# Patient Record
Sex: Female | Born: 1960
Health system: Southern US, Community
[De-identification: ages and names within clinical notes are randomized; demographics above are authoritative.]

## PROBLEM LIST (undated history)

## (undated) DIAGNOSIS — I209 Angina pectoris, unspecified: Secondary | ICD-10-CM

## (undated) DIAGNOSIS — M5412 Radiculopathy, cervical region: Secondary | ICD-10-CM

## (undated) DIAGNOSIS — M549 Dorsalgia, unspecified: Secondary | ICD-10-CM

## (undated) DIAGNOSIS — E669 Obesity, unspecified: Secondary | ICD-10-CM

## (undated) DIAGNOSIS — E119 Type 2 diabetes mellitus without complications: Secondary | ICD-10-CM

## (undated) DIAGNOSIS — G4730 Sleep apnea, unspecified: Secondary | ICD-10-CM

## (undated) DIAGNOSIS — F329 Major depressive disorder, single episode, unspecified: Secondary | ICD-10-CM

## (undated) DIAGNOSIS — G8929 Other chronic pain: Secondary | ICD-10-CM

## (undated) DIAGNOSIS — Z86018 Personal history of other benign neoplasm: Secondary | ICD-10-CM

## (undated) DIAGNOSIS — M5030 Other cervical disc degeneration, unspecified cervical region: Secondary | ICD-10-CM

## (undated) DIAGNOSIS — R0600 Dyspnea, unspecified: Secondary | ICD-10-CM

## (undated) DIAGNOSIS — K862 Cyst of pancreas: Secondary | ICD-10-CM

## (undated) DIAGNOSIS — J329 Chronic sinusitis, unspecified: Secondary | ICD-10-CM

## (undated) DIAGNOSIS — F32A Depression, unspecified: Secondary | ICD-10-CM

## (undated) DIAGNOSIS — I10 Essential (primary) hypertension: Secondary | ICD-10-CM

## (undated) HISTORY — PX: NASAL SINUS SURGERY: SHX719

## (undated) HISTORY — DX: Depression, unspecified: F32.A

## (undated) HISTORY — PX: ABDOMINAL HYSTERECTOMY: SHX81

## (undated) HISTORY — DX: Major depressive disorder, single episode, unspecified: F32.9

## (undated) HISTORY — DX: Radiculopathy, cervical region: M54.12

## (undated) HISTORY — DX: Obesity, unspecified: E66.9

## (undated) HISTORY — DX: Sleep apnea, unspecified: G47.30

---
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
## 2000-04-03 ENCOUNTER — Inpatient Hospital Stay (HOSPITAL_COMMUNITY): Admission: AD | Admit: 2000-04-03 | Discharge: 2000-04-03 | Payer: Self-pay | Admitting: Obstetrics

## 2000-04-04 ENCOUNTER — Encounter: Payer: Self-pay | Admitting: Obstetrics

## 2000-04-04 ENCOUNTER — Ambulatory Visit (HOSPITAL_COMMUNITY): Admission: RE | Admit: 2000-04-04 | Discharge: 2000-04-04 | Payer: Self-pay | Admitting: Obstetrics

## 2000-04-06 ENCOUNTER — Inpatient Hospital Stay (HOSPITAL_COMMUNITY): Admission: AD | Admit: 2000-04-06 | Discharge: 2000-04-06 | Payer: Self-pay | Admitting: Obstetrics

## 2000-04-11 ENCOUNTER — Encounter (INDEPENDENT_AMBULATORY_CARE_PROVIDER_SITE_OTHER): Payer: Self-pay

## 2000-04-11 ENCOUNTER — Ambulatory Visit (HOSPITAL_COMMUNITY): Admission: RE | Admit: 2000-04-11 | Discharge: 2000-04-11 | Payer: Self-pay | Admitting: Obstetrics

## 2004-04-05 ENCOUNTER — Emergency Department (HOSPITAL_COMMUNITY): Admission: EM | Admit: 2004-04-05 | Discharge: 2004-04-05 | Payer: Self-pay | Admitting: Emergency Medicine

## 2004-07-05 ENCOUNTER — Emergency Department (HOSPITAL_COMMUNITY): Admission: EM | Admit: 2004-07-05 | Discharge: 2004-07-05 | Payer: Self-pay | Admitting: Emergency Medicine

## 2004-07-10 ENCOUNTER — Ambulatory Visit: Payer: Self-pay | Admitting: Family Medicine

## 2005-02-05 ENCOUNTER — Emergency Department (HOSPITAL_COMMUNITY): Admission: EM | Admit: 2005-02-05 | Discharge: 2005-02-05 | Payer: Self-pay | Admitting: Family Medicine

## 2006-03-18 ENCOUNTER — Emergency Department (HOSPITAL_COMMUNITY): Admission: EM | Admit: 2006-03-18 | Discharge: 2006-03-18 | Payer: Self-pay | Admitting: Emergency Medicine

## 2006-05-20 ENCOUNTER — Ambulatory Visit (HOSPITAL_COMMUNITY): Admission: RE | Admit: 2006-05-20 | Discharge: 2006-05-20 | Payer: Self-pay | Admitting: Chiropractic Medicine

## 2006-06-03 ENCOUNTER — Encounter: Admission: RE | Admit: 2006-06-03 | Discharge: 2006-06-26 | Payer: Self-pay | Admitting: Family Medicine

## 2006-06-26 ENCOUNTER — Encounter: Admission: RE | Admit: 2006-06-26 | Discharge: 2006-06-26 | Payer: Self-pay | Admitting: Internal Medicine

## 2006-07-02 ENCOUNTER — Encounter (INDEPENDENT_AMBULATORY_CARE_PROVIDER_SITE_OTHER): Payer: Self-pay | Admitting: Specialist

## 2006-07-02 ENCOUNTER — Ambulatory Visit (HOSPITAL_COMMUNITY): Admission: RE | Admit: 2006-07-02 | Discharge: 2006-07-02 | Payer: Self-pay | Admitting: Obstetrics & Gynecology

## 2006-07-02 ENCOUNTER — Encounter: Admission: RE | Admit: 2006-07-02 | Discharge: 2006-07-02 | Payer: Self-pay | Admitting: Internal Medicine

## 2006-07-02 HISTORY — PX: BREAST BIOPSY: SHX20

## 2006-10-08 ENCOUNTER — Emergency Department (HOSPITAL_COMMUNITY): Admission: EM | Admit: 2006-10-08 | Discharge: 2006-10-08 | Payer: Self-pay | Admitting: Emergency Medicine

## 2006-10-23 ENCOUNTER — Inpatient Hospital Stay (HOSPITAL_COMMUNITY): Admission: RE | Admit: 2006-10-23 | Discharge: 2006-10-26 | Payer: Self-pay | Admitting: Obstetrics & Gynecology

## 2006-10-23 ENCOUNTER — Encounter (INDEPENDENT_AMBULATORY_CARE_PROVIDER_SITE_OTHER): Payer: Self-pay | Admitting: Specialist

## 2007-05-25 ENCOUNTER — Emergency Department (HOSPITAL_COMMUNITY): Admission: EM | Admit: 2007-05-25 | Discharge: 2007-05-25 | Payer: Self-pay | Admitting: Emergency Medicine

## 2007-08-23 ENCOUNTER — Ambulatory Visit (HOSPITAL_BASED_OUTPATIENT_CLINIC_OR_DEPARTMENT_OTHER): Admission: RE | Admit: 2007-08-23 | Discharge: 2007-08-23 | Payer: Self-pay | Admitting: Allergy and Immunology

## 2007-08-30 ENCOUNTER — Ambulatory Visit: Payer: Self-pay | Admitting: Internal Medicine

## 2007-09-15 ENCOUNTER — Ambulatory Visit (HOSPITAL_BASED_OUTPATIENT_CLINIC_OR_DEPARTMENT_OTHER): Admission: RE | Admit: 2007-09-15 | Discharge: 2007-09-15 | Payer: Self-pay | Admitting: Allergy and Immunology

## 2007-10-17 ENCOUNTER — Ambulatory Visit: Payer: Self-pay | Admitting: Internal Medicine

## 2007-11-12 ENCOUNTER — Emergency Department (HOSPITAL_COMMUNITY): Admission: EM | Admit: 2007-11-12 | Discharge: 2007-11-12 | Payer: Self-pay | Admitting: Family Medicine

## 2008-05-13 ENCOUNTER — Encounter (INDEPENDENT_AMBULATORY_CARE_PROVIDER_SITE_OTHER): Payer: Self-pay | Admitting: Nurse Practitioner

## 2008-06-21 ENCOUNTER — Ambulatory Visit: Payer: Self-pay | Admitting: Family Medicine

## 2008-06-21 DIAGNOSIS — I10 Essential (primary) hypertension: Secondary | ICD-10-CM

## 2008-06-21 LAB — CONVERTED CEMR LAB
Albumin: 4.3 g/dL (ref 3.5–5.2)
Alkaline Phosphatase: 74 units/L (ref 39–117)
BUN: 10 mg/dL (ref 6–23)
Eosinophils Absolute: 0.3 10*3/uL (ref 0.0–0.7)
Eosinophils Relative: 5 % (ref 0–5)
Glucose, Bld: 79 mg/dL (ref 70–99)
HCT: 40.5 % (ref 36.0–46.0)
Lymphs Abs: 2.5 10*3/uL (ref 0.7–4.0)
MCV: 81.5 fL (ref 78.0–100.0)
Monocytes Relative: 7 % (ref 3–12)
Potassium: 4.5 meq/L (ref 3.5–5.3)
RBC: 4.97 M/uL (ref 3.87–5.11)
TSH: 0.764 microintl units/mL (ref 0.350–4.50)
WBC: 5.5 10*3/uL (ref 4.0–10.5)

## 2008-07-04 ENCOUNTER — Encounter (INDEPENDENT_AMBULATORY_CARE_PROVIDER_SITE_OTHER): Payer: Self-pay | Admitting: Family Medicine

## 2008-09-13 ENCOUNTER — Ambulatory Visit: Payer: Self-pay | Admitting: Family Medicine

## 2008-09-15 ENCOUNTER — Ambulatory Visit: Payer: Self-pay | Admitting: *Deleted

## 2008-09-20 ENCOUNTER — Encounter (INDEPENDENT_AMBULATORY_CARE_PROVIDER_SITE_OTHER): Payer: Self-pay | Admitting: Nurse Practitioner

## 2008-09-20 ENCOUNTER — Encounter (INDEPENDENT_AMBULATORY_CARE_PROVIDER_SITE_OTHER): Payer: Self-pay | Admitting: Family Medicine

## 2008-09-23 ENCOUNTER — Ambulatory Visit: Payer: Self-pay | Admitting: Family Medicine

## 2008-09-29 ENCOUNTER — Ambulatory Visit (HOSPITAL_COMMUNITY): Admission: RE | Admit: 2008-09-29 | Discharge: 2008-09-29 | Payer: Self-pay | Admitting: Family Medicine

## 2008-09-30 ENCOUNTER — Ambulatory Visit: Payer: Self-pay | Admitting: Family Medicine

## 2008-10-03 ENCOUNTER — Telehealth (INDEPENDENT_AMBULATORY_CARE_PROVIDER_SITE_OTHER): Payer: Self-pay | Admitting: Family Medicine

## 2008-10-04 ENCOUNTER — Ambulatory Visit: Payer: Self-pay | Admitting: Family Medicine

## 2008-10-10 ENCOUNTER — Encounter (INDEPENDENT_AMBULATORY_CARE_PROVIDER_SITE_OTHER): Payer: Self-pay | Admitting: Family Medicine

## 2008-10-14 ENCOUNTER — Emergency Department (HOSPITAL_COMMUNITY): Admission: EM | Admit: 2008-10-14 | Discharge: 2008-10-14 | Payer: Self-pay | Admitting: Emergency Medicine

## 2008-10-18 ENCOUNTER — Encounter: Admission: RE | Admit: 2008-10-18 | Discharge: 2008-10-18 | Payer: Self-pay | Admitting: Family Medicine

## 2008-10-21 ENCOUNTER — Ambulatory Visit: Payer: Self-pay | Admitting: Family Medicine

## 2008-10-28 ENCOUNTER — Ambulatory Visit: Payer: Self-pay | Admitting: Family Medicine

## 2008-10-31 ENCOUNTER — Ambulatory Visit: Payer: Self-pay | Admitting: Family Medicine

## 2008-11-04 ENCOUNTER — Ambulatory Visit: Payer: Self-pay | Admitting: Family Medicine

## 2008-11-28 ENCOUNTER — Ambulatory Visit: Payer: Self-pay | Admitting: Family Medicine

## 2008-12-02 ENCOUNTER — Ambulatory Visit: Payer: Self-pay | Admitting: Family Medicine

## 2009-01-30 ENCOUNTER — Ambulatory Visit: Payer: Self-pay | Admitting: Family Medicine

## 2009-02-02 ENCOUNTER — Emergency Department (HOSPITAL_COMMUNITY): Admission: EM | Admit: 2009-02-02 | Discharge: 2009-02-02 | Payer: Self-pay | Admitting: Emergency Medicine

## 2009-02-03 ENCOUNTER — Emergency Department (HOSPITAL_COMMUNITY): Admission: EM | Admit: 2009-02-03 | Discharge: 2009-02-03 | Payer: Self-pay | Admitting: Emergency Medicine

## 2009-02-26 ENCOUNTER — Encounter (INDEPENDENT_AMBULATORY_CARE_PROVIDER_SITE_OTHER): Payer: Self-pay | Admitting: Family Medicine

## 2009-02-26 LAB — CONVERTED CEMR LAB
ALT: 18 units/L (ref 0–35)
AST: 17 units/L (ref 0–37)
Albumin: 3.9 g/dL (ref 3.5–5.2)
Alkaline Phosphatase: 88 units/L (ref 39–117)
Glucose, Bld: 84 mg/dL (ref 70–99)
LDL Cholesterol: 148 mg/dL — ABNORMAL HIGH (ref 0–99)
Potassium: 4 meq/L (ref 3.5–5.3)
Sodium: 141 meq/L (ref 135–145)
Total Protein: 7.8 g/dL (ref 6.0–8.3)
Triglycerides: 102 mg/dL (ref <150)

## 2009-04-05 ENCOUNTER — Emergency Department (HOSPITAL_COMMUNITY): Admission: EM | Admit: 2009-04-05 | Discharge: 2009-04-05 | Payer: Self-pay | Admitting: Emergency Medicine

## 2010-05-11 ENCOUNTER — Encounter (INDEPENDENT_AMBULATORY_CARE_PROVIDER_SITE_OTHER): Payer: Self-pay | Admitting: Nurse Practitioner

## 2010-11-04 ENCOUNTER — Encounter: Payer: Self-pay | Admitting: Family Medicine

## 2010-11-13 NOTE — Letter (Signed)
Summary: REQUESTING RECORDS FOR SELF//RECEIVED  REQUESTING RECORDS FOR SELF//RECEIVED   Imported By: Arta Bruce 06/12/2010 12:13:57  _____________________________________________________________________  External Attachment:    Type:   Image     Comment:   External Document

## 2011-01-23 LAB — WOUND CULTURE

## 2011-02-26 NOTE — Procedures (Signed)
NAME:  NAVY, Michele Wood    ACCOUNT NO.:  [id number]   MEDICAL RECORD NO.:  [id number]          PATIENT TYPE:  OUT   LOCATION:  SLEEP CENTER                 FACILITY:  [facility]   PHYSICIAN:  Clinton D. Maple Hudson, MD, FCCP, FACPDATE OF BIRTH:  1962/05/26   DATE OF STUDY:  09/15/2007                            NOCTURNAL POLYSOMNOGRAM   REFERRING PHYSICIAN:  Jessica Priest, M.D.   INDICATION FOR STUDY:  Hypersomnia with sleep apnea.  BMI 34, weight 200  pounds, height 64 inches, neck 15 inches.   EPWORTH SLEEPINESS SCORE:  8/24   MEDICATIONS:  Home medications listed and reviewed.  No bedtime  medication was reported.   A baseline diagnostic study on August 23, 2007, recorded an AHI of 13.4  per hour.  CPAP titration is requested.   SLEEP ARCHITECTURE:  Total sleep time 393 minutes with sleep efficiency  90%.  Stage I was 12%, stage II 65%, stage III 11%, REM 11% of total  sleep time.  Sleep latency 13 minutes, REM latency 126 minutes.  Awake  after sleep onset 29 minutes.  Arousal index 3.7.   RESPIRATORY DATA:  CPAP titration protocol.  CPAP was titrated to 11  CWP, AHI 0.5 per hour.  A medium Mirage Quattro mask was used with  heated humidifier.   OXYGEN DATA:  Snoring was prevented and oxygen saturation held at a mean  of 97% on CPAP with room air.   CARDIAC DATA:  Normal sinus rhythm.   MOVEMENT-PARASOMNIA:  Occasional limb jerk with little effect on sleep.  No bathroom trips.   IMPRESSIONS-RECOMMENDATIONS:  1. Successful CPAP titration to 11 CWP, AHI 0.5 per hour.  A medium      Mirage Quattro full face mask was used with heated humidifier.  2. Baseline diagnostic study on August 23, 2007, had recorded an AHI      of 13.4 per hour.     Clinton D. Maple Hudson, MD, [facility], FACP  Diplomate, Biomedical engineer of Sleep Medicine  Electronically Signed    CDY/MEDQ  D:  09/20/2007 15:26:48  T:  09/21/2007 09:81:19  Job:  [id number]

## 2011-02-26 NOTE — Procedures (Signed)
NAME:  Michele Wood, Michele Wood    ACCOUNT NO.:  [id number]   MEDICAL RECORD NO.:  [id number]          PATIENT TYPE:  OUT   LOCATION:  SLEEP CENTER                 FACILITY:  [facility]   PHYSICIAN:  Clinton D. Maple Hudson, MD, FCCP, FACPDATE OF BIRTH:  [birth year] 10[birth year] 1961   DATE OF STUDY:  08/23/2007                            NOCTURNAL POLYSOMNOGRAM   REFERRING PHYSICIAN:  Jessica Priest, M.D.   INDICATIONS FOR STUDY:  Insomnia with sleep apnea.   EPWORTH SLEEPINESS SCORE:  7/24.  BMI 45.3, weight 249 pounds, height 62 inches, neck 15 inches.   MEDICATIONS:  Home medications are listed and reviewed. Noted to include  Clarinex D, potential stimulant.   SLEEP ARCHITECTURE:  Total sleep time 305 times with sleep efficiency  76%. Stage 1 was 7%, stage 2 is 75%, stage 3 absent. REM 17% of total  sleep time. Sleep latency 17 minutes, REM latency 94 minutes, awake  after sleep onset 78 minutes. Arousal index 13.7. No bedtime medication  was taken.   RESPIRATORY DATA:  Apnea-hypopnea index (AHI) 13.4 obstructive events  per hour indicating mild obstructive sleep apnea/hypopnea syndrome.  There were 2 obstructive apneas and 66 hypopneas. Events were  nonpositional. REM AHI 28.6. There were insufficient events to prevent  CPAP titration by split protocol on this study night.   OXYGEN DATA:  Very loud snoring with oxygen desaturation to a nadir of  88%. Mean oxygen saturation through the study 95.9%.   CARDIAC DATA:  Sinus rhythm with occasional PVC.   MOVEMENT-PARASOMNIA:  Occasional limb jerk with arousal, not  significant.   IMPRESSIONS-RECOMMENDATIONS:  1. Mild obstructive sleep apnea/hypopnea syndrome, AHI 13.4 per hour,      nonpositional events, loud snoring and oxygen desaturation to a      nadir of 88%.  2. There were insufficient events on this study to permit CPAP      titration by split protocol in the available time. Consider return      for CPAP titration or evaluate for alternative  therapies as      appropriate.      Clinton D. Maple Hudson, MD, [facility], FACP  Diplomate, Biomedical engineer of Sleep Medicine  Electronically Signed     CDY/MEDQ  D:  08/30/2007 12:42:17  T:  08/31/2007 08:59:17  Job:  [id number]

## 2011-03-01 NOTE — Op Note (Signed)
NAME:  Michele Wood, Michele Wood    ACCOUNT NO.:  [id number]   MEDICAL RECORD NO.:  [id number]           PATIENT TYPE:  INP   LOCATION:                                FACILITY:  [facility]   PHYSICIAN:  Roseanna Rainbow, M.D.DATE OF BIRTH:  1961/07/06   DATE OF PROCEDURE:  DATE OF DISCHARGE:  10/26/2006                               OPERATIVE REPORT   PREOPERATIVE DIAGNOSIS:  Symptomatic uterine fibroids.   POSTOPERATIVE DIAGNOSIS:  Symptomatic uterine fibroids.   PROCEDURE:  Total abdominal hysterectomy.   SURGEON:  Roseanna Rainbow, M.D.  Charles A. Clearance Coots, M.D.   ANESTHESIA:  General endotracheal anesthesia.   ESTIMATED BLOOD LOSS:  100 mL.   COMPLICATIONS:  None   FINDINGS:  The uterus was diffusely enlarged.  There were multiple  subserosal myomas.  The remainder of the pelvic anatomy was normal  appearing.   DESCRIPTION OF PROCEDURE:  The patient was taken to the operating room  with an IV running.  She was given general anesthesia, placed in the  dorsal lithotomy position, and prepped and draped in the usual sterile  fashion.  The previous Pfannenstiel scar was then incised with a scalpel  and carried down to the underlying fascia.  The fascia was nicked in the  midline.  The fascial incision was then extended bilaterally with curved  Mayo scissors.  The superior aspect of the fascial incision was then  tented up and the underlying rectus muscles dissected off.  The inferior  aspect of the fascial incision was manipulated in a similar fashion.  The rectus muscles were separated in the midline.  The parietal  peritoneum was tented up and entered sharply.  The peritoneal incision  was then extended superiorly and inferiorly.   At this point, the bowel was packed away with moistened laparotomy  packs.  A large fundal subserosal myoma was grasped with a towel clip  for retraction.  The Deavers were placed into the incision for exposure.  The round ligaments were  divided bilaterally using the Bovie.  The broad  ligament was divided with the Bovie.  The bladder flap was then  developed using the Bovie.  The utero-ovarian ligaments and fallopian  tubes were then doubly clamped with parametrial clamps and transected.  These pedicles were secured bilaterally with both free ligatures and  suture ligatures of 0 plain.  Adequate hemostasis was noted.  The  uterine arteries were then skeletonized bilaterally.  The arteries were  then clamped with parametrial clamps, transected and suture-ligated with  0 Vicryl.   At this point, the uterine fundus was amputated above the uterine vessel  ligatures.  An O'Connor-O'Sullivan retractor was placed into the  incision.  The bowel was repacked.  The cardinal ligaments and  uterosacral ligaments were then sequentially clamped with parametrial  clamps, transected and suture ligated.  The vaginal angles were secured  with suture ligatures of 0 Vicryl.  The remainder of the vaginal cuff  was closed using interrupted sutures of 0 Vicryl was used.  This was  after the cervix had been amputated with scissors.   The pelvis was copiously irrigated.  Adequate hemostasis was noted.  The  laparotomy packs and retractors were then removed from the abdomen.  The  parietal peritoneum was reapproximated in a running fashion using 2-0  Monocryl.  The fascia was closed in a running fashion using 0 PDS.  The  skin was reapproximated in a subcuticular fashion using 3-0 Monocryl.  At the close of the procedure, the instrument and pack counts were said  to be correct x2.  A gram of cephazolin had been given prior to the  procedure.  The patient was taken to the PACU awake and in stable  condition.      Roseanna Rainbow, M.D.  Electronically Signed     LAJ/MEDQ  D:  10/23/2006  T:  10/23/2006  Job:  [id number]

## 2011-03-01 NOTE — Discharge Summary (Signed)
NAME:  Michele Wood, Michele Wood    ACCOUNT NO.:  [id number]   MEDICAL RECORD NO.:  [id number]          PATIENT TYPE:  INP   LOCATION:  9309                          FACILITY:  [facility]   PHYSICIAN:  Charles A. Clearance Coots, M.D.DATE OF BIRTH:  23-Apr-1961   DATE OF ADMISSION:  10/23/2006  DATE OF DISCHARGE:  10/26/2006                               DISCHARGE SUMMARY   ADMITTING DIAGNOSIS:  Symptomatic uterine fibroids.   DISCHARGE DIAGNOSIS:  1. Symptomatic uterine fibroids.  2. Status post total abdominal hysterectomy.   DISCHARGE CONDITION:  Home in good condition.   REASON FOR ADMISSION:  A 50 year old black female with painful uterine  fibroids presents for a total abdominal hysterectomy.  She gives a  history of menometrorrhagia workup to date with hemoglobin of 11 on  07/21/2006 ultrasound on 07/02/2006 revealed the uterus approximately 14  cm in length with multiple uterine fibroids.  The patient desired  definitive surgical therapy.   PAST MEDICAL HISTORY:  Surgery:  Cesarean section.  Illnesses:  Hypertension.   MEDICATIONS:  See chart.   ALLERGIES:  CODEINE gives a rash.   SOCIAL HISTORY:  Married, minimal alcohol, negative tobacco,  recreational drug use.   PHYSICAL EXAM:  GENERAL:  An Obese female in no acute distress.  VITAL SIGNS:  Temperature 98.2, pulse 71, blood pressure 160/110, weight  240 pounds, height 5 feet 2 inches.  LUNGS:  Clear to auscultation bilaterally.  HEART:  Regular rate and rhythm.  ABDOMEN:  Mass arising from the pelvis approximately four fingerbreadths  above the symphysis pubis.  PELVIC EXAM:  Remarkable for a 16 weeks' size uterus.   ADMITTING LABORATORY VALUES:  Hemoglobin 11, hematocrit 33, white blood  cell count 6800, platelets 271,000.  Comprehensive metabolic panel was  within normal limits.   HOSPITAL COURSE:  The patient underwent total abdominal hysterectomy on  10/23/2006.  There are no intraoperative complications.   Postoperative  course was uncomplicated.  The patient was discharged home on postop day  #2 in good condition.   DISCHARGE LABORATORY VALUES:  Hemoglobin 10, hematocrit 30, white blood  cell count 11,900, platelets 273,000.   DISCHARGE DISPOSITION:  1. Tylox and ibuprofen was prescribed for pain.  2. Routine written instructions were given for discharge after      hysterectomy.  3. The patient is call office for a followup appointment in 2 weeks.      Charles A. Clearance Coots, M.D.  Electronically Signed     CAH/MEDQ  D:  10/26/2006  T:  10/26/2006  Job:  [id number]

## 2011-03-01 NOTE — Op Note (Signed)
[facility]  Patient:    Michele Wood, Michele Wood                       MRN: [id number] Adm. Date:  [id number] Attending:  Venita Sheffield                           Operative Report  PREOPERATIVE DIAGNOSIS:       Blighted ovum.  POSTOPERATIVE DIAGNOSIS:      Blighted ovum.  PROCEDURE:                    Dilation and evacuation.  SURGEON:                      Kathreen Cosier, M.D.  DESCRIPTION OF PROCEDURE:     Using MAC with the patient in the lithotomy position, the perineum and vagina were prepped and draped and the bladder was emptied with a straight catheter.  Bimanual exam revealed the uterus to be 16 week size and irregular with multiple myomas.  A weighted speculum was placed in the vagina.  The cervix was injected at 3 and 9 oclock with a total of 7 cc of 1% Xylocaine.  The cervix was noted to be open.  It was dilated to a #29 [employer].  Using a #9 suction, the endometrial cavity was entered and, using the suction, the uterine contents were aspirated, revealing a small amount of tissue.  The patient tolerated the procedure well and went to the recovery room in good condition. DD:  04/11/00 TD:  04/12/00 Job: [id number]

## 2011-05-12 ENCOUNTER — Emergency Department (HOSPITAL_COMMUNITY)
Admission: EM | Admit: 2011-05-12 | Discharge: 2011-05-13 | Disposition: A | Discharge status: Home / Self Care | Payer: Self-pay | Attending: Emergency Medicine | Admitting: Emergency Medicine

## 2011-05-12 DIAGNOSIS — R51 Headache: Secondary | ICD-10-CM | POA: Insufficient documentation

## 2011-05-12 DIAGNOSIS — L988 Other specified disorders of the skin and subcutaneous tissue: Secondary | ICD-10-CM | POA: Insufficient documentation

## 2011-05-12 DIAGNOSIS — J329 Chronic sinusitis, unspecified: Secondary | ICD-10-CM | POA: Insufficient documentation

## 2011-05-12 DIAGNOSIS — I10 Essential (primary) hypertension: Secondary | ICD-10-CM | POA: Insufficient documentation

## 2011-05-13 ENCOUNTER — Encounter (HOSPITAL_COMMUNITY): Payer: Self-pay

## 2011-05-13 ENCOUNTER — Emergency Department (HOSPITAL_COMMUNITY): Payer: Self-pay

## 2012-04-07 ENCOUNTER — Emergency Department (HOSPITAL_COMMUNITY)
Admission: EM | Admit: 2012-04-07 | Discharge: 2012-04-07 | Disposition: A | Discharge status: Home / Self Care | Payer: Self-pay | Attending: Emergency Medicine | Admitting: Emergency Medicine

## 2012-04-07 ENCOUNTER — Encounter (HOSPITAL_COMMUNITY): Payer: Self-pay | Admitting: Emergency Medicine

## 2012-04-07 DIAGNOSIS — Z885 Allergy status to narcotic agent status: Secondary | ICD-10-CM | POA: Insufficient documentation

## 2012-04-07 DIAGNOSIS — L089 Local infection of the skin and subcutaneous tissue, unspecified: Secondary | ICD-10-CM | POA: Insufficient documentation

## 2012-04-07 DIAGNOSIS — I10 Essential (primary) hypertension: Secondary | ICD-10-CM

## 2012-04-07 DIAGNOSIS — Z90710 Acquired absence of both cervix and uterus: Secondary | ICD-10-CM | POA: Insufficient documentation

## 2012-04-07 DIAGNOSIS — L02219 Cutaneous abscess of trunk, unspecified: Secondary | ICD-10-CM

## 2012-04-07 HISTORY — DX: Essential (primary) hypertension: I10

## 2012-04-07 MED ORDER — HYDROCHLOROTHIAZIDE 25 MG PO TABS: 25.0000 mg | ORAL_TABLET | Freq: Every day | ORAL | Status: DC

## 2012-04-07 MED ORDER — SULFAMETHOXAZOLE-TRIMETHOPRIM 800-160 MG PO TABS: 2.0000 | ORAL_TABLET | Freq: Two times a day (BID) | ORAL | Status: AC

## 2012-04-07 MED ORDER — LISINOPRIL 20 MG PO TABS: 20.0000 mg | ORAL_TABLET | Freq: Every day | ORAL | Status: DC

## 2012-04-07 MED ORDER — HYDROCODONE-ACETAMINOPHEN 5-500 MG PO TABS: 1.0000 | ORAL_TABLET | Freq: Four times a day (QID) | ORAL | Status: AC | PRN

## 2012-04-07 MED ORDER — LISINOPRIL 20 MG PO TABS
20.0000 mg | ORAL_TABLET | Freq: Once | ORAL | Status: AC
  Administered 2012-04-07: 20 mg via ORAL
  Filled 2012-04-07 (×2): qty 1

## 2012-04-07 MED ORDER — HYDROCHLOROTHIAZIDE 25 MG PO TABS
25.0000 mg | ORAL_TABLET | Freq: Every day | ORAL | Status: DC
  Administered 2012-04-07: 25 mg via ORAL
  Filled 2012-04-07 (×2): qty 1

## 2012-04-07 NOTE — ED Notes (Signed)
ZOX:[id number]<AV> Expected date:04/07/12<BR> Expected time: 9:02 AM<BR> Means of arrival:Ambulance<BR> Comments:<BR> M31. 29yoM.  L hand/wrist pain x 10 days

## 2012-04-07 NOTE — ED Notes (Addendum)
Pt c/o of boil in genitalia area that started x2 days ago. Pt states that it has clear fluid leaking from it. Painful to touch.

## 2012-04-07 NOTE — Discharge Instructions (Signed)
Continue warm compresses to the area. Change dressing as needed, keep packing in. Vicodin for pain as needed, do not drive if taking it. You can take ibuprofen otherwise. Take bactrim for infection as prescribed, take until all gone. Follow up with [place] or with PCP in two days for recheck.   Abscess An abscess (boil or furuncle) is an infected area that contains a collection of pus.  SYMPTOMS Signs and symptoms of an abscess include pain, tenderness, redness, or hardness. You may feel a moveable soft area under your skin. An abscess can occur anywhere in the body.  TREATMENT  A surgical cut (incision) may be made over your abscess to drain the pus. Gauze may be packed into the space or a drain may be looped through the abscess cavity (pocket). This provides a drain that will allow the cavity to heal from the inside outwards. The abscess may be painful for a few days, but should feel much better if it was drained.  Your abscess, if seen early, may not have localized and may not have been drained. If not, another appointment may be required if it does not get better on its own or with medications. HOME CARE INSTRUCTIONS   Only take over-the-counter or prescription medicines for pain, discomfort, or fever as directed by your caregiver.   Take your antibiotics as directed if they were prescribed. Finish them even if you start to feel better.   Keep the skin and clothes clean around your abscess.   If the abscess was drained, you will need to use gauze dressing to collect any draining pus. Dressings will typically need to be changed 3 or more times a day.   The infection may spread by skin contact with others. Avoid skin contact as much as possible.   Practice good hygiene. This includes regular hand washing, cover any draining skin lesions, and do not share personal care items.   If you participate in sports, do not share athletic equipment, towels, whirlpools, or personal care items. Shower after  every practice or tournament.   If a draining area cannot be adequately covered:   Do not participate in sports.   Children should not participate in day care until the wound has healed or drainage stops.   If your caregiver has given you a follow-up appointment, it is very important to keep that appointment. Not keeping the appointment could result in a much worse infection, chronic or permanent injury, pain, and disability. If there is any problem keeping the appointment, you must call back to this facility for assistance.  SEEK MEDICAL CARE IF:   You develop increased pain, swelling, redness, drainage, or bleeding in the wound site.   You develop signs of generalized infection including muscle aches, chills, fever, or a general ill feeling.   You have an oral temperature above 102 F (38.9 C).  MAKE SURE YOU:   Understand these instructions.   Will watch your condition.   Will get help right away if you are not doing well or get worse.  Document Released: 07/10/2005 Document Revised: 09/19/2011 Document Reviewed: 05/03/2008 [facility] Patient Information 2012 [place], [place].

## 2012-04-07 NOTE — ED Provider Notes (Signed)
History     CSN: [id number]  Arrival date & time 04/07/12  8657   First MD Initiated Contact with Patient 04/07/12 [phone]      Chief Complaint  Patient presents with  . Recurrent Skin Infections    (Consider location/radiation/quality/duration/timing/severity/associated sxs/prior treatment) Patient is a 51 y.o. female presenting with abscess. The history is provided by the patient.  Abscess  This is a new problem. The current episode started less than one week ago. The problem has been gradually worsening. The abscess is present on the genitalia. The problem is moderate. The abscess is characterized by redness, painfulness and swelling. Pertinent negatives include no fever.  Pt states she developed a "pimple" over her genitalia two days ago. Last night and through the night, it has worsened. States now very large, tender. States tried to take epsom salt bath, and her boyfriend punctured it with a needle, but only clear fluid came out. PT has no history of the same.   Past Medical History  Diagnosis Date  . Hypertension     Past Surgical History  Procedure Date  . Abdominal hysterectomy     No family history on file.  History  Substance Use Topics  . Smoking status: Never Smoker   . Smokeless tobacco: Not on file  . Alcohol Use: Yes     socially    OB History    Grav Para Term Preterm Abortions TAB SAB Ect Mult Living                  Review of Systems  Constitutional: Negative for fever and chills.  Respiratory: Negative.   Cardiovascular: Negative.   Skin: Positive for wound.    Allergies  Codeine sulfate  Home Medications  No current outpatient prescriptions on file.  BP 166/115  Pulse 78  Temp 98 F (36.7 C) (Oral)  Resp 15  SpO2 97%  Physical Exam  Nursing note and vitals reviewed. Constitutional: She appears well-developed and well-nourished. No distress.  HENT:  Head: Normocephalic.  Eyes: Conjunctivae are normal.  Pulmonary/Chest: Effort  normal and breath sounds normal. No respiratory distress. She has no wheezes. She has no rales.  Abdominal: Soft. Bowel sounds are normal. She exhibits no distension. There is no tenderness. There is no rebound.  Neurological: She is alert.  Skin: Skin is warm and dry.       4cm suprapubic superficial abscess, area is indurated, erythemous, tender  Psychiatric: She has a normal mood and affect.    ED Course  Procedures (including critical care time)  INCISION AND DRAINAGE Performed by: Jaynie Crumble A Consent: Verbal consent obtained. Risks and benefits: risks, benefits and alternatives were discussed Type: abscess  Body area: pubic  Anesthesia: local infiltration  Local anesthetic: lidocaine 2% w/ epinephrine  Anesthetic total: 4 ml  Complexity: complex Blunt dissection to break up loculations  Drainage: purulent  Drainage amount: minimal  Packing material: 1/4 in iodoform gauze  Patient tolerance: Patient tolerated the procedure well with no immediate complications.  9:58 AM Abscess drained, packed. Pt non toxic, afebrile, BP elevated, will recheck. i will start pt on antibiotic, and return in 2 days for recheck.   10:48 AM Pt's rechecked BP elevated at 187/127. Pt asymptomatic for it. She has no complaints. No headache, chest pain, swelling. SHe has been on BP medications for "years" but has been off of them for a year because could not avoid. Pharmacy called pt's pharmacy, pt at one pt was on lisinopril-hydrochlorothiazied 20-25,  will restart.     1. Abscess of pubic region   2. Hypertension       MDM          Lottie Mussel, PA 04/07/12 1609

## 2012-04-11 NOTE — ED Provider Notes (Signed)
Medical screening examination/treatment/procedure(s) were performed by non-physician practitioner and as supervising physician I was immediately available for consultation/collaboration.   Jamiel Goncalves E Shamona Wirtz, MD 04/11/12 1626 

## 2012-10-23 ENCOUNTER — Emergency Department (HOSPITAL_COMMUNITY)
Admission: EM | Admit: 2012-10-23 | Discharge: 2012-10-23 | Disposition: A | Discharge status: Home / Self Care | Payer: Self-pay | Attending: Emergency Medicine | Admitting: Emergency Medicine

## 2012-10-23 ENCOUNTER — Emergency Department (HOSPITAL_COMMUNITY): Payer: Self-pay

## 2012-10-23 DIAGNOSIS — Z79899 Other long term (current) drug therapy: Secondary | ICD-10-CM | POA: Insufficient documentation

## 2012-10-23 DIAGNOSIS — R509 Fever, unspecified: Secondary | ICD-10-CM | POA: Insufficient documentation

## 2012-10-23 DIAGNOSIS — Z90710 Acquired absence of both cervix and uterus: Secondary | ICD-10-CM | POA: Insufficient documentation

## 2012-10-23 DIAGNOSIS — J029 Acute pharyngitis, unspecified: Secondary | ICD-10-CM | POA: Insufficient documentation

## 2012-10-23 DIAGNOSIS — R51 Headache: Secondary | ICD-10-CM | POA: Insufficient documentation

## 2012-10-23 DIAGNOSIS — I10 Essential (primary) hypertension: Secondary | ICD-10-CM | POA: Insufficient documentation

## 2012-10-23 DIAGNOSIS — R0602 Shortness of breath: Secondary | ICD-10-CM | POA: Insufficient documentation

## 2012-10-23 DIAGNOSIS — J189 Pneumonia, unspecified organism: Secondary | ICD-10-CM | POA: Insufficient documentation

## 2012-10-23 MED ORDER — AZITHROMYCIN 250 MG PO TABS: 250.0000 mg | ORAL_TABLET | Freq: Every day | ORAL | Status: DC

## 2012-10-23 NOTE — ED Provider Notes (Signed)
History  This chart was scribed for Magnus Sinning, PA-C working with Hurman Horn, MD by Shari Heritage, ED Scribe. This patient was seen in room WTR7/WTR7 and the patient's care was started at 1706.   CSN: [id number]  Arrival date & time 10/23/12  1600   First MD Initiated Contact with Patient 10/23/12 1706      Chief Complaint  Patient presents with  . Cough     Patient is a 52 y.o. female presenting with cough. The history is provided by the patient. No language interpreter was used.  Cough This is a new problem. The current episode started 2 days ago. The problem occurs constantly. The problem has been gradually worsening. The cough is productive of sputum. The fever has been present for less than 1 day. Associated symptoms include chills, headaches, sore throat and shortness of breath. Pertinent negatives include no myalgias. She has tried nothing for the symptoms. She is not a smoker. Her past medical history does not include COPD or asthma.    HPI Comments: Michele Wood is a 52 y.o. female who presents to the Emergency Department complaining of persistent, moderate, gradually worsening productive cough onset 2 days ago. There is associated shortness of breath, headache, fever (unmeasured), chills and sore throat. Patient has left facial pain that radiates to left ear. Patient hasn't taken any cough suppressants or other medicine for relief. Patient denies body aches, neck stiffness, nausea, vomiting or diarrhea. Patient denies history of asthma or COPD. She denies any recent hospitalizations. Patient does not smoke. Medical history does include hypertension.    Past Medical History  Diagnosis Date  . Hypertension     Past Surgical History  Procedure Date  . Abdominal hysterectomy     No family history on file.  History  Substance Use Topics  . Smoking status: Never Smoker   . Smokeless tobacco: Not on file  . Alcohol Use: Yes     Comment: socially     OB History    Grav Para Term Preterm Abortions TAB SAB Ect Mult Living                  Review of Systems  Constitutional: Positive for fever (unmeasured) and chills.  HENT: Positive for sore throat.   Respiratory: Positive for cough and shortness of breath.   Gastrointestinal: Negative for nausea, vomiting and diarrhea.  Musculoskeletal: Negative for myalgias.  Neurological: Positive for headaches.  All other systems reviewed and are negative.    Allergies  Codeine sulfate  Home Medications   Current Outpatient Rx  Name  Route  Sig  Dispense  Refill  . HYDROCHLOROTHIAZIDE 25 MG PO TABS   Oral   Take 1 tablet (25 mg total) by mouth daily.   30 tablet   2   . LISINOPRIL 20 MG PO TABS   Oral   Take 1 tablet (20 mg total) by mouth daily.   30 tablet   2   . OVER THE COUNTER MEDICATION   Oral   Take 1 tablet by mouth daily. Candida clear for sinuses         . OVER THE COUNTER MEDICATION   Oral   Take by mouth daily. Vinegar=1 tablespoonful for acid reflux           Triage Vitals: BP 181/99  Pulse 96  Temp 99.5 F (37.5 C) (Oral)  Resp 16  SpO2 98%  Physical Exam  Nursing note and vitals reviewed. Constitutional: She  appears well-developed and well-nourished.  HENT:  Head: Normocephalic and atraumatic.  Right Ear: Tympanic membrane and ear canal normal.  Left Ear: Tympanic membrane and ear canal normal.  Nose: Mucosal edema present. Left sinus exhibits maxillary sinus tenderness and frontal sinus tenderness.  Mouth/Throat: Oropharynx is clear and moist. No oropharyngeal exudate or posterior oropharyngeal erythema.       Pain over left frontal and maxillary sinuses.   Eyes: EOM are normal. Pupils are equal, round, and reactive to light.  Neck: Normal range of motion. Neck supple.  Cardiovascular: Normal rate, regular rhythm and normal heart sounds.   Pulmonary/Chest: Effort normal and breath sounds normal.  Musculoskeletal: Normal range of  motion.  Lymphadenopathy:    She has cervical adenopathy (anterior).  Neurological: She is alert.  Skin: Skin is warm and dry.  Psychiatric: She has a normal mood and affect. Her behavior is normal.    ED Course  Procedures (including critical care time) DIAGNOSTIC STUDIES: Oxygen Saturation is 98% on room air, normal by my interpretation.    COORDINATION OF CARE: 5:12 PM- Patient informed of current plan for treatment and evaluation and agrees with plan at this time.    Labs Reviewed - No data to display  Dg Chest 2 View  10/23/2012  *RADIOLOGY REPORT*  Clinical Data: Cough, congestion, shortness of breath, left side chest pain, hypertension, chills  CHEST - 2 VIEW  Comparison: None  Findings: Upper normal-size of cardiac silhouette. Tortuous aorta. Pulmonary vascularity normal. Lingular consolidation consistent with pneumonia. Remaining lungs clear. No pleural effusion or pneumothorax. Bones unremarkable.  IMPRESSION: Lingular pneumonia.   Original Report Authenticated By: Ulyses Southward, M.D.      No diagnosis found.    MDM  CXR showing Pneumonia.  Patient not hypoxic or in respiratory distress.  No hospitalizations in past 3 months.  Therefore, patient discharged home with Rx for Azithromycin for CAP.  Return precautions given.    I personally performed the services described in this documentation, which was scribed in my presence. The recorded information has been reviewed and is accurate.    Pascal Lux Marvin, PA-C 10/24/12 [phone]

## 2012-10-23 NOTE — ED Notes (Signed)
Pt c/o cough for 2 days. Pt states she is coughing up green and clear sputum. Pt c/o pain to L side of face and neck that radiates to L ear. Pt also c/o headache and fevers and chills. Pt states she took Motrin this morning and it helped a little.

## 2012-10-30 NOTE — ED Provider Notes (Signed)
Medical screening examination/treatment/procedure(s) were performed by non-physician practitioner and as supervising physician I was immediately available for consultation/collaboration.   Dionta Larke M Demonte Dobratz, MD 10/30/12 2120 

## 2013-05-30 ENCOUNTER — Encounter (HOSPITAL_COMMUNITY): Payer: Self-pay | Admitting: Emergency Medicine

## 2013-05-30 ENCOUNTER — Emergency Department (HOSPITAL_COMMUNITY)
Admission: EM | Admit: 2013-05-30 | Discharge: 2013-05-30 | Disposition: A | Discharge status: Home / Self Care | Payer: Self-pay | Attending: Emergency Medicine | Admitting: Emergency Medicine

## 2013-05-30 DIAGNOSIS — I10 Essential (primary) hypertension: Secondary | ICD-10-CM | POA: Insufficient documentation

## 2013-05-30 DIAGNOSIS — M545 Low back pain, unspecified: Secondary | ICD-10-CM | POA: Insufficient documentation

## 2013-05-30 DIAGNOSIS — M549 Dorsalgia, unspecified: Secondary | ICD-10-CM

## 2013-05-30 MED ORDER — NAPROXEN 500 MG PO TABS: 500.0000 mg | ORAL_TABLET | Freq: Two times a day (BID) | ORAL | Status: DC

## 2013-05-30 MED ORDER — LISINOPRIL-HYDROCHLOROTHIAZIDE 20-12.5 MG PO TABS: 1.0000 | ORAL_TABLET | Freq: Every day | ORAL | Status: DC

## 2013-05-30 MED ORDER — PREDNISONE 20 MG PO TABS: ORAL_TABLET | ORAL | Status: DC

## 2013-05-30 MED ORDER — CYCLOBENZAPRINE HCL 10 MG PO TABS: 10.0000 mg | ORAL_TABLET | Freq: Two times a day (BID) | ORAL | Status: DC | PRN

## 2013-05-30 NOTE — ED Provider Notes (Signed)
  CSN: [id number]     Arrival date & time 05/30/13  1743 History     First MD Initiated Contact with Patient 05/30/13 1802     Chief Complaint  Patient presents with  . Back Pain   (Consider location/radiation/quality/duration/timing/severity/associated sxs/prior Treatment) HPI.... sharp upper and lower back pain for several weeks without radicular symptoms.   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 No trauma or injury.  Severity is mild. No radiation.  Past Medical History  Diagnosis Date  . Hypertension    Past Surgical History  Procedure Laterality Date  . Abdominal hysterectomy     No family history on file. History  Substance Use Topics  . Smoking status: Never Smoker   . Smokeless tobacco: Not on file  . Alcohol Use: Yes     Comment: socially   OB History   Grav Para Term Preterm Abortions TAB SAB Ect Mult Living                 Review of Systems  All other systems reviewed and are negative.    Allergies  Codeine sulfate  Home Medications   Current Outpatient Rx  Name  Route  Sig  Dispense  Refill  . Aspirin-Salicylamide-Caffeine (BC FAST PAIN RELIEF) 650-195-33.3 MG PACK   Oral   Take 1 packet by mouth every 8 (eight) hours as needed (pain).         . cyclobenzaprine (FLEXERIL) 10 MG tablet   Oral   Take 1 tablet (10 mg total) by mouth 2 (two) times daily as needed for muscle spasms.   20 tablet   0   . naproxen (NAPROSYN) 500 MG tablet   Oral   Take 1 tablet (500 mg total) by mouth 2 (two) times daily.   30 tablet   0   . predniSONE (DELTASONE) 20 MG tablet      3 tabs po day one, then 2 po daily x 4 days   11 tablet   0    BP 219/106  Pulse 92  Temp(Src) 98.9 F (37.2 C) (Oral)  Resp 22  SpO2 100% Physical Exam  Nursing note and vitals reviewed. Constitutional: She is oriented to person, place, and time. She appears well-developed and well-nourished.  HENT:  Head: Normocephalic and atraumatic.  Eyes: Conjunctivae and EOM  are normal. Pupils are equal, round, and reactive to light.  Neck: Normal range of motion. Neck supple.  Cardiovascular: Normal rate, regular rhythm and normal heart sounds.   Pulmonary/Chest: Effort normal and breath sounds normal.  Abdominal: Soft. Bowel sounds are normal.  Musculoskeletal: Normal range of motion.  Minimal tenderness of the thoracic and lower lumbar spine.  Full range of motion of arms and legs. Patient is ambulatory.  Neurological: She is alert and oriented to person, place, and time.  Skin: Skin is warm and dry.  Psychiatric: She has a normal mood and affect.    ED Course   Procedures (including critical care time)  Labs Reviewed - No data to display No results found. 1. Back pain     MDM  Patient is morbidly obese. This contributes to her back pain.  Rx Naprosyn, Flexeril, prednisone.   Resource guide given for primary care followup  Donnetta Hutching, MD 05/30/13 1840

## 2013-05-30 NOTE — ED Notes (Signed)
Pt alert, arrives from home, c/o back pain, onset was this am, denies trauma or injury, resp even unlabored, skin pwd

## 2013-05-30 NOTE — ED Notes (Signed)
Dr. Adriana Simas notified of patient's blood pressure.

## 2013-06-08 ENCOUNTER — Ambulatory Visit: Payer: Self-pay | Attending: Internal Medicine

## 2013-06-11 ENCOUNTER — Ambulatory Visit: Payer: Self-pay | Attending: Family Medicine | Admitting: Internal Medicine

## 2013-06-11 ENCOUNTER — Ambulatory Visit (HOSPITAL_COMMUNITY)
Admission: RE | Admit: 2013-06-11 | Discharge: 2013-06-11 | Disposition: A | Discharge status: Home / Self Care | Payer: Self-pay | Source: Ambulatory Visit | Attending: Internal Medicine | Admitting: Internal Medicine

## 2013-06-11 ENCOUNTER — Encounter: Payer: Self-pay | Admitting: Internal Medicine

## 2013-06-11 VITALS — BP 219/106 | HR 92

## 2013-06-11 DIAGNOSIS — M542 Cervicalgia: Secondary | ICD-10-CM | POA: Insufficient documentation

## 2013-06-11 DIAGNOSIS — M5137 Other intervertebral disc degeneration, lumbosacral region: Secondary | ICD-10-CM | POA: Insufficient documentation

## 2013-06-11 DIAGNOSIS — E119 Type 2 diabetes mellitus without complications: Secondary | ICD-10-CM

## 2013-06-11 DIAGNOSIS — M51379 Other intervertebral disc degeneration, lumbosacral region without mention of lumbar back pain or lower extremity pain: Secondary | ICD-10-CM | POA: Insufficient documentation

## 2013-06-11 DIAGNOSIS — G8929 Other chronic pain: Secondary | ICD-10-CM | POA: Insufficient documentation

## 2013-06-11 DIAGNOSIS — M4310 Spondylolisthesis, site unspecified: Secondary | ICD-10-CM | POA: Insufficient documentation

## 2013-06-11 DIAGNOSIS — M549 Dorsalgia, unspecified: Secondary | ICD-10-CM | POA: Insufficient documentation

## 2013-06-11 DIAGNOSIS — M5030 Other cervical disc degeneration, unspecified cervical region: Secondary | ICD-10-CM | POA: Insufficient documentation

## 2013-06-11 DIAGNOSIS — I10 Essential (primary) hypertension: Secondary | ICD-10-CM | POA: Insufficient documentation

## 2013-06-11 MED ORDER — IBUPROFEN 600 MG PO TABS: 600.0000 mg | ORAL_TABLET | Freq: Three times a day (TID) | ORAL | Status: DC | PRN

## 2013-06-11 MED ORDER — AMLODIPINE BESYLATE 10 MG PO TABS: 10.0000 mg | ORAL_TABLET | Freq: Every day | ORAL | Status: DC

## 2013-06-11 NOTE — Patient Instructions (Addendum)

## 2013-06-11 NOTE — Progress Notes (Signed)
Patient ID: Michele Wood, female   DOB: [birth year], 52 y.o.   MRN: [id number]  CC: New patient  HPI: 52 year old female with past medical history of chronic back pain, recently diagnosed hypertension who presented to our clinic for establishing primary care provider. Patient reports feeling chronic back and neck pain and takes naproxen for pain relief but with no significant symptomatic relief. Patient reports having 7/10 pain in her neck and low back. She is able to ambulate but she does experience constant pain. Patient does not report any headache or nausea or vomiting. No blurry vision or double vision. Her blood pressure was high during this visit but she is compliant with medications. She recently started Hctz/Lisinopril. No abdominal pain. No chest pain or shortness of breath.  Allergies  Allergen Reactions  . Codeine Sulfate     REACTION: rash   Past Medical History  Diagnosis Date  . Hypertension    Current Outpatient Prescriptions on File Prior to Visit  Medication Sig Dispense Refill  . Aspirin-Salicylamide-Caffeine (BC FAST PAIN RELIEF) 650-195-33.3 MG PACK Take 1 packet by mouth every 8 (eight) hours as needed (pain).      . cyclobenzaprine (FLEXERIL) 10 MG tablet Take 1 tablet (10 mg total) by mouth 2 (two) times daily as needed for muscle spasms.  20 tablet  0  . lisinopril-hydrochlorothiazide (ZESTORETIC) 20-12.5 MG per tablet Take 1 tablet by mouth daily.  30 tablet  1  . naproxen (NAPROSYN) 500 MG tablet Take 1 tablet (500 mg total) by mouth 2 (two) times daily.  30 tablet  0  . predniSONE (DELTASONE) 20 MG tablet 3 tabs po day one, then 2 po daily x 4 days  11 tablet  0   No current facility-administered medications on file prior to visit.   Family history significant for hypertension in parents  History   Social History  . Marital Status: Married    Spouse Name: N/A    Number of Children: N/A  . Years of Education: N/A   Occupational History  . Not  on file.   Social History Main Topics  . Smoking status: Never Smoker   . Smokeless tobacco: Not on file  . Alcohol Use: Yes     Comment: socially  . Drug Use:   . Sexual Activity:    Other Topics Concern  . Not on file   Social History Narrative  . No narrative on file    Review of Systems  Constitutional: Negative for fever, chills, diaphoresis, activity change, appetite change and fatigue.  HENT: Negative for ear pain, nosebleeds, congestion, facial swelling, rhinorrhea   Eyes: Negative for pain, discharge, redness, itching and visual disturbance.  Respiratory: Negative for cough, choking, chest tightness, shortness of breath, wheezing and stridor.   Cardiovascular: Negative for chest pain, palpitations and leg swelling.  Gastrointestinal: Negative for abdominal distention.  Genitourinary: Negative for dysuria, urgency, frequency, hematuria, flank pain, decreased urine volume, difficulty urinating and dyspareunia.  Musculoskeletal: Positive for neck pain and back pain.  Neurological: Negative for dizziness, tremors, seizures, syncope, facial asymmetry, speech difficulty, weakness, light-headedness, numbness and headaches.  Hematological: Negative for adenopathy. Does not bruise/bleed easily.  Psychiatric/Behavioral: Negative for hallucinations, behavioral problems, confusion, dysphoric mood, decreased concentration and agitation.    Objective:   Filed Vitals:   06/11/13 1230  BP: 219/106  Pulse: 92    Physical Exam  Constitutional: Appears well-developed and well-nourished. No distress.  HENT: Normocephalic. External right and left ear normal. Oropharynx is clear and  moist.  Eyes: Conjunctivae and EOM are normal. PERRLA, no scleral icterus.  Neck: Neck pain, normal range of motion.  CVS: RRR, S1/S2 +, no murmurs, no gallops, no carotid bruit.  Pulmonary: Effort and breath sounds normal, no stridor, rhonchi, wheezes, rales.  Abdominal: Soft. BS +,  no distension,  tenderness, rebound or guarding.  Musculoskeletal: Normal range of motion. No edema and no tenderness.  Lymphadenopathy: No lymphadenopathy noted, cervical, inguinal. Neuro: Alert. Normal reflexes, muscle tone coordination. No cranial nerve deficit. Skin: Skin is warm and dry. No rash noted. Not diaphoretic. No erythema. No pallor.  Psychiatric: Normal mood and affect. Behavior, judgment, thought content normal.   Lab Results  Component Value Date   WBC 5.5 06/21/2008   HGB 12.8 06/21/2008   HCT 40.5 06/21/2008   MCV 81.5 06/21/2008   PLT 276 06/21/2008   Lab Results  Component Value Date   CREATININE 0.62 01/30/2009   BUN 12 01/30/2009   NA 141 01/30/2009   K 4.0 01/30/2009   CL 104 01/30/2009   CO2 24 01/30/2009    No results found for this basename: HGBA1C   Lipid Panel     Component Value Date/Time   CHOL 220* 01/30/2009 2310   TRIG 102 01/30/2009 2310   HDL 52 01/30/2009 2310   CHOLHDL 4.2 Ratio 01/30/2009 2310   VLDL 20 01/30/2009 2310   LDLCALC 148* 01/30/2009 2310       Assessment and plan:   Patient Active Problem List   Diagnosis Date Noted  . Neck pain,  Back pain 06/11/2013    Priority: High - Likely degenerative arthritis  - Obtain x-ray of cervical spine and x-ray of the lumbar spine - Patient will followup with [place] in one week to review the results. - The medication provided: Ibuprofen 600 mg every 4-6 hours as needed for pain control   . HYPERTENSION 06/21/2008    Priority: High - We have discussed target BP range - I have advised pt to check BP regularly and to call us back if the numbers are higher than 140/90 - discussed the importance of compliance with medical therapy and diet  - Continued high blood pressure medication HCTZ/lisinopril  - Added Norvasc 10 mg daily  - Repeat lipid panel, TSH and A1c

## 2013-06-11 NOTE — Addendum Note (Signed)
Addended by: Alison Murray on: 06/11/2013 01:09 PM   Modules accepted: Orders

## 2013-06-12 LAB — TSH: TSH: 0.94 u[IU]/mL (ref 0.350–4.500)

## 2013-06-12 LAB — LIPID PANEL
HDL: 37 mg/dL — ABNORMAL LOW (ref >39)
Total CHOL/HDL Ratio: 5.8 Ratio
Triglycerides: 249 mg/dL — ABNORMAL HIGH (ref <150)

## 2013-07-01 ENCOUNTER — Encounter: Payer: Self-pay | Admitting: Internal Medicine

## 2013-07-01 ENCOUNTER — Ambulatory Visit: Payer: No Typology Code available for payment source | Attending: Internal Medicine | Admitting: Internal Medicine

## 2013-07-01 ENCOUNTER — Other Ambulatory Visit: Payer: Self-pay | Admitting: *Deleted

## 2013-07-01 ENCOUNTER — Ambulatory Visit: Payer: No Typology Code available for payment source

## 2013-07-01 VITALS — BP 155/99 | HR 92 | Temp 98.2°F | Ht 62.0 in | Wt 253.8 lb

## 2013-07-01 DIAGNOSIS — I10 Essential (primary) hypertension: Secondary | ICD-10-CM

## 2013-07-01 DIAGNOSIS — J302 Other seasonal allergic rhinitis: Secondary | ICD-10-CM

## 2013-07-01 DIAGNOSIS — M545 Low back pain, unspecified: Secondary | ICD-10-CM | POA: Insufficient documentation

## 2013-07-01 DIAGNOSIS — J309 Allergic rhinitis, unspecified: Secondary | ICD-10-CM

## 2013-07-01 DIAGNOSIS — M542 Cervicalgia: Secondary | ICD-10-CM | POA: Insufficient documentation

## 2013-07-01 DIAGNOSIS — E785 Hyperlipidemia, unspecified: Secondary | ICD-10-CM

## 2013-07-01 MED ORDER — LISINOPRIL-HYDROCHLOROTHIAZIDE 20-12.5 MG PO TABS: 2.0000 | ORAL_TABLET | Freq: Every day | ORAL | Status: DC

## 2013-07-01 MED ORDER — LORATADINE 10 MG PO TABS: 10.0000 mg | ORAL_TABLET | Freq: Every day | ORAL | Status: DC

## 2013-07-01 NOTE — Addendum Note (Signed)
Addended by: Jimmy Footman K on: 07/01/2013 11:30 AM   Modules accepted: Orders

## 2013-07-01 NOTE — Progress Notes (Signed)
Patient ID: Michele Wood, female   DOB: 10/09/1961, 52 y.o.   MRN: [id number] Chief complaint:  follow up   History of present illness 52 year old female who was recently seen in the clinic for hypertension and neck and low back pain. She was evaluated a few weeks back and ordered for x-ray of cervical and lumbar spine. For her hypertension she was continued on HCTZ/lisinopril and amlodipine was added. Patient reports occasional headache and stuffy nose. She still has some neck and low back pain off-and-on but improved with NSAIDs. She denies any chest pain, palpitations, shortness of breath, abdominal pain, nausea, vomiting, bowel or urinary symptoms.   Review of systems As outlined in history of present illness  Vital signs in last 24 hours:  Filed Vitals:   07/01/13 1049  BP: 155/99  Pulse: 92  Temp: 98.2 F (36.8 C)  TempSrc: Oral  Height: 5\' 2"  (1.575 m)  Weight: 253 lb 12.8 oz (115.123 kg)  SpO2: 97%       Physical Exam:  General: middle aged obese female  in no acute distress. HEENT: no pallor, no icterus, moist oral mucosa,  Heart: Normal  s1 &s2  Regular rate and rhythm, without murmurs, rubs, gallops. Lungs: Clear to auscultation bilaterally. Abdomen: Soft, nontender, nondistended, positive bowel sounds. Extremities: No clubbing cyanosis or edema with positive pedal pulses. Neuro: Alert, awake, oriented x3, nonfocal.   Lab Results:  Basic Metabolic Panel:    Component Value Date/Time   NA 141 01/30/2009 2310   K 4.0 01/30/2009 2310   CL 104 01/30/2009 2310   CO2 24 01/30/2009 2310   BUN 12 01/30/2009 2310   CREATININE 0.62 01/30/2009 2310   GLUCOSE 84 01/30/2009 2310   CALCIUM 9.1 01/30/2009 2310   CBC:    Component Value Date/Time   WBC 5.5 06/21/2008 2347   HGB 12.8 06/21/2008 2347   HCT 40.5 06/21/2008 2347   PLT 276 06/21/2008 2347   MCV 81.5 06/21/2008 2347   NEUTROABS 2.4 06/21/2008 2347   LYMPHSABS 2.5 06/21/2008 2347   MONOABS 0.4 06/21/2008 2347    EOSABS 0.3 06/21/2008 2347   BASOSABS 0.0 06/21/2008 2347    No results found for this or any previous visit (from the past 240 hour(s)).  Studies/Results: No results found.  Medications: Scheduled Meds: Continuous Infusions: PRN Meds:.    Assessment/Plan: Hypertension Blood pressure still elevated despite adding amlodipine. I would increase the dose of lisinopril-HCTZ to 40-25 mg and have her followup in 2 weeks for blood pressure monitoring. Continue amlodipine.  Low back pain and neck pain X-ray the view shows degenerative changes. Recommended on strengthening exercises and OTC pain meds as needed. i have encouraged on dietary modifications and regular exercise to lose weight.  Seasonal allergy  reports stuffy nose and headaches off and on. i will give her a prescription for loratadine.   We will also check BMET  Emira Eubanks 07/01/2013, 11:02 AM

## 2013-07-02 LAB — BASIC METABOLIC PANEL WITH GFR
BUN: 14 mg/dL (ref 6–23)
CO2: 28 meq/L (ref 19–32)
Calcium: 9.2 mg/dL (ref 8.4–10.5)
Chloride: 103 meq/L (ref 96–112)
Creat: 0.73 mg/dL (ref 0.50–1.10)
Glucose, Bld: 85 mg/dL (ref 70–99)
Potassium: 4.2 meq/L (ref 3.5–5.3)
Sodium: 140 meq/L (ref 135–145)

## 2013-07-15 ENCOUNTER — Encounter: Payer: Self-pay | Admitting: Family Medicine

## 2013-07-15 ENCOUNTER — Ambulatory Visit: Payer: No Typology Code available for payment source | Attending: Internal Medicine | Admitting: Family Medicine

## 2013-07-15 ENCOUNTER — Ambulatory Visit: Payer: No Typology Code available for payment source | Attending: Internal Medicine

## 2013-07-15 VITALS — BP 158/111 | HR 97 | Temp 98.1°F | Resp 18 | Ht 62.0 in | Wt 256.0 lb

## 2013-07-15 DIAGNOSIS — K219 Gastro-esophageal reflux disease without esophagitis: Secondary | ICD-10-CM

## 2013-07-15 DIAGNOSIS — B070 Plantar wart: Secondary | ICD-10-CM

## 2013-07-15 DIAGNOSIS — I10 Essential (primary) hypertension: Secondary | ICD-10-CM

## 2013-07-15 NOTE — Patient Instructions (Addendum)
Plantar Warts Plantar warts are growths on the bottom of your foot. Warts are caused by a germ.  HOME CARE  Soak your foot in warm water. Dry your foot when you are done. Remove the top layer of softened skin, then apply any medicine as told by your doctor.  Remove any bandages daily. File off extra wart tissue. Repeat this as told by your doctor until the wart goes away.  Only use medicine as told by your doctor.  Use a bandage with a hole in it (doughnut bandage) to relieve pain.Put the hole over the wart.  Wear shoes and socks and change them daily.  Keep your foot clean and dry.  Check your feet regularly.  Avoid contact with warts on other people.  Have your warts checked by your doctor. GET HELP RIGHT AWAY IF: The treated skin becomes red, puffy (swollen), or painful. MAKE SURE YOU:  Understand these instructions.  Will watch your condition.  Will get help right away if you are not doing well or get worse. Document Released: 11/02/2010 Document Revised: 12/23/2011 Document Reviewed: 11/02/2010 [facility] Patient Information 2014 [place], [place].   Hypertension As your heart beats, it forces blood through your arteries. This force is your blood pressure. If the pressure is too high, it is called hypertension (HTN) or high blood pressure. HTN is dangerous because you may have it and not know it. High blood pressure may mean that your heart has to work harder to pump blood. Your arteries may be narrow or stiff. The extra work puts you at risk for heart disease, stroke, and other problems.  Blood pressure consists of two numbers, a higher number over a lower, 110/72, for example. It is stated as "110 over 72." The ideal is below 120 for the top number (systolic) and under 80 for the bottom (diastolic). Write down your blood pressure today. You should pay close attention to your blood pressure if you have certain conditions such as:  Heart failure.  Prior heart  attack.  Diabetes  Chronic kidney disease.  Prior stroke.  Multiple risk factors for heart disease. To see if you have HTN, your blood pressure should be measured while you are seated with your arm held at the level of the heart. It should be measured at least twice. A one-time elevated blood pressure reading (especially in the Emergency Department) does not mean that you need treatment. There may be conditions in which the blood pressure is different between your right and left arms. It is important to see your caregiver soon for a recheck. Most people have essential hypertension which means that there is not a specific cause. This type of high blood pressure may be lowered by changing lifestyle factors such as:  Stress.  Smoking.  Lack of exercise.  Excessive weight.  Drug/tobacco/alcohol use.  Eating less salt. Most people do not have symptoms from high blood pressure until it has caused damage to the body. Effective treatment can often prevent, delay or reduce that damage. TREATMENT  When a cause has been identified, treatment for high blood pressure is directed at the cause. There are a large number of medications to treat HTN. These fall into several categories, and your caregiver will help you select the medicines that are best for you. Medications may have side effects. You should review side effects with your caregiver. If your blood pressure stays high after you have made lifestyle changes or started on medicines,   Your medication(s) may need to be  changed.  Other problems may need to be addressed.  Be certain you understand your prescriptions, and know how and when to take your medicine.  Be sure to follow up with your caregiver within the time frame advised (usually within two weeks) to have your blood pressure rechecked and to review your medications.  If you are taking more than one medicine to lower your blood pressure, make sure you know how and at what times they  should be taken. Taking two medicines at the same time can result in blood pressure that is too low. SEEK IMMEDIATE MEDICAL CARE IF:  You develop a severe headache, blurred or changing vision, or confusion.  You have unusual weakness or numbness, or a faint feeling.  You have severe chest or abdominal pain, vomiting, or breathing problems. MAKE SURE YOU:   Understand these instructions.  Will watch your condition.  Will get help right away if you are not doing well or get worse. Document Released: 09/30/2005 Document Revised: 12/23/2011 Document Reviewed: 05/20/2008 [facility] Patient Information 2014 [place], [place].

## 2013-07-15 NOTE — Progress Notes (Signed)
Patient ID: Michele Wood, female   DOB: 02/24/1961, 52 y.o.   MRN: [id number]  CC: left foot   HPI: The patient is presenting because she has a small knot that she developed on the bottom of her left foot that she noticed 2 days ago.  She reports that she thinks it be a cyst.  She reports that she noticed little bit of a bump and swelling in the area.  The patient reports that she feels it when she is walking.  The patient reports a foreign objects stepped on.  The patient reports no fever chills or drainage from the area.  The patient reports no redness or irritation surrounding the area.  Allergies  Allergen Reactions  . Codeine Sulfate     REACTION: rash   Past Medical History  Diagnosis Date  . Hypertension    Current Outpatient Prescriptions on File Prior to Visit  Medication Sig Dispense Refill  . amLODipine (NORVASC) 10 MG tablet Take 1 tablet (10 mg total) by mouth daily.  90 tablet  3  . ibuprofen (ADVIL,MOTRIN) 600 MG tablet Take 1 tablet (600 mg total) by mouth every 8 (eight) hours as needed for pain.  30 tablet  0  . lisinopril-hydrochlorothiazide (ZESTORETIC) 20-12.5 MG per tablet Take 2 tablets by mouth daily.  30 tablet  1  . cyclobenzaprine (FLEXERIL) 10 MG tablet Take 1 tablet (10 mg total) by mouth 2 (two) times daily as needed for muscle spasms.  20 tablet  0  . loratadine (CLARITIN) 10 MG tablet Take 1 tablet (10 mg total) by mouth daily.  30 tablet  1  . naproxen (NAPROSYN) 500 MG tablet Take 1 tablet (500 mg total) by mouth 2 (two) times daily.  30 tablet  0   No current facility-administered medications on file prior to visit.   History reviewed. No pertinent family history. History   Social History  . Marital Status: Married    Spouse Name: N/A    Number of Children: N/A  . Years of Education: N/A   Occupational History  . Not on file.   Social History Main Topics  . Smoking status: Never Smoker   . Smokeless tobacco: Not on file  .  Alcohol Use: Yes     Comment: socially  . Drug Use:   . Sexual Activity:    Other Topics Concern  . Not on file   Social History Narrative  . No narrative on file    Review of Systems  Constitutional: Negative for fever, chills, diaphoresis, activity change, appetite change and fatigue.  HENT: Negative for ear pain, nosebleeds, congestion, facial swelling, rhinorrhea, neck pain, neck stiffness and ear discharge.   Eyes: Negative for pain, discharge, redness, itching and visual disturbance.  Respiratory: Negative for cough, choking, chest tightness, shortness of breath, wheezing and stridor.   Cardiovascular: Negative for chest pain, palpitations and leg swelling.  Gastrointestinal: Negative for abdominal distention.  Genitourinary: Negative for dysuria, urgency, frequency, hematuria, flank pain, decreased urine volume, difficulty urinating and dyspareunia.  Musculoskeletal: Negative for back pain, joint swelling, arthralgias and gait problem.  Neurological: Negative for dizziness, tremors, seizures, syncope, facial asymmetry, speech difficulty, weakness, light-headedness, numbness and headaches.  Hematological: Negative for adenopathy. Does not bruise/bleed easily.  Psychiatric/Behavioral: Negative for hallucinations, behavioral problems, confusion, dysphoric mood, decreased concentration and agitation.    Objective:   Filed Vitals:   07/15/13 1628  BP: 158/111  Pulse: 97  Temp: 98.1 F (36.7 C)  Resp: 18  Physical Exam  Constitutional: Appears well-developed and well-nourished. No distress.  HENT: Normocephalic. External right and left ear normal. Oropharynx is clear and moist.  Eyes: Conjunctivae and EOM are normal. PERRLA, no scleral icterus.  Neck: Normal ROM. Neck supple. No JVD. No tracheal deviation. No thyromegaly.  CVS: RRR, S1/S2 +, no murmurs, no gallops, no carotid bruit.  Pulmonary: Effort and breath sounds normal, no stridor, rhonchi, wheezes, rales.   Abdominal: Soft. BS +,  no distension, tenderness, rebound or guarding.  Musculoskeletal: exam of left foot unremarkable, very small calloused area under pad between 1st and 2nd toe, no cyst palpated. Lymphadenopathy: No lymphadenopathy noted, cervical, inguinal. Neuro: Alert. Normal reflexes, muscle tone coordination. No cranial nerve deficit. Skin: Skin is warm and dry. No rash noted. Not diaphoretic. No erythema. No pallor.  Psychiatric: Normal mood and affect. Behavior, judgment, thought content normal.   Lab Results  Component Value Date   WBC 5.5 06/21/2008   HGB 12.8 06/21/2008   HCT 40.5 06/21/2008   MCV 81.5 06/21/2008   PLT 276 06/21/2008   Lab Results  Component Value Date   CREATININE 0.73 07/01/2013   BUN 14 07/01/2013   NA 140 07/01/2013   K 4.2 07/01/2013   CL 103 07/01/2013   CO2 28 07/01/2013    No results found for this basename: HGBA1C   Lipid Panel     Component Value Date/Time   CHOL 214* 06/11/2013 1235   TRIG 249* 06/11/2013 1235   HDL 37* 06/11/2013 1235   CHOLHDL 5.8 06/11/2013 1235   VLDL 50* 06/11/2013 1235   LDLCALC 127* 06/11/2013 1235       Assessment and plan:   Patient Active Problem List   Diagnosis Date Noted  . Plantar wart of left foot 07/15/2013  . Neck pain 06/11/2013  . DEPRESSION 10/31/2008  . HYPERTENSION 06/21/2008  . GERD 06/21/2008  . LATERAL EPICONDYLITIS, RIGHT 06/21/2008   Question of Left plantar wart near bottom on 2nd toe  Referral to podiatry for further evaluation.  Recommended that patient use cushion by Dr. Margart Sickles to protect the area until she can be seen by podiatry.  RTC in 2 months for recheck  Go home and take BP meds, I am concerned about her BP today, she says that her BP is always elevated when she has any discomfort or is worried.  Recommended pt have BP recheck in office in 2 weeks.   RTC in 2 months  The patient was given clear instructions to go to ER or return to medical center if symptoms don't improve,  worsen or new problems develop.  The patient verbalized understanding.  The patient was told to call to get any lab results if not heard anything in the next week.    Rodney Langton, MD, CDE, FAAFP [facility] [place], [place]

## 2013-07-15 NOTE — Progress Notes (Signed)
Pt here with c/o left foot top sole pain with long standing x 1 week Swelling noted BP 158/111

## 2013-08-23 ENCOUNTER — Encounter: Payer: Self-pay | Admitting: Internal Medicine

## 2013-08-23 ENCOUNTER — Ambulatory Visit: Payer: No Typology Code available for payment source | Attending: Internal Medicine | Admitting: Internal Medicine

## 2013-08-23 DIAGNOSIS — J329 Chronic sinusitis, unspecified: Secondary | ICD-10-CM

## 2013-08-23 DIAGNOSIS — I10 Essential (primary) hypertension: Secondary | ICD-10-CM

## 2013-08-23 MED ORDER — AMLODIPINE BESYLATE 10 MG PO TABS: 10.0000 mg | ORAL_TABLET | Freq: Every day | ORAL | Status: DC

## 2013-08-23 MED ORDER — CYCLOBENZAPRINE HCL 10 MG PO TABS: 10.0000 mg | ORAL_TABLET | Freq: Two times a day (BID) | ORAL | Status: DC | PRN

## 2013-08-23 MED ORDER — AZITHROMYCIN 250 MG PO TABS: ORAL_TABLET | ORAL | Status: DC

## 2013-08-23 MED ORDER — FEXOFENADINE-PSEUDOEPHED ER 180-240 MG PO TB24: 1.0000 | ORAL_TABLET | Freq: Every day | ORAL | Status: DC

## 2013-08-23 MED ORDER — LISINOPRIL-HYDROCHLOROTHIAZIDE 20-12.5 MG PO TABS: 2.0000 | ORAL_TABLET | Freq: Every day | ORAL | Status: DC

## 2013-08-23 NOTE — Patient Instructions (Signed)

## 2013-08-23 NOTE — Progress Notes (Signed)
Patient ID: Michele Wood, female   DOB: January 15, 1961, 52 y.o.   MRN: [id number]  CC: follow up  HPI: 52 year old female with past medical history of hypertension, chronic neck pain who presents to clinic for followup. Patient reports having chronic neck pain and besides taking Flexeril which minimally relieves the pain patient reports she is not able to hold the neck without a resting more than 2 hours. No loss of consciousness. No lightheadedness.  Allergies  Allergen Reactions  . Codeine Sulfate     REACTION: rash   Past Medical History  Diagnosis Date  . Hypertension    Current Outpatient Prescriptions on File Prior to Visit  Medication Sig Dispense Refill  . naproxen (NAPROSYN) 500 MG tablet Take 1 tablet (500 mg total) by mouth 2 (two) times daily.  30 tablet  0   No current facility-administered medications on file prior to visit.   HTN in mother.  History   Social History  . Marital Status: Married    Spouse Name: N/A    Number of Children: N/A  . Years of Education: N/A   Occupational History  . Not on file.   Social History Main Topics  . Smoking status: Never Smoker   . Smokeless tobacco: Not on file  . Alcohol Use: Yes     Comment: socially  . Drug Use:   . Sexual Activity:    Other Topics Concern  . Not on file   Social History Narrative  . No narrative on file    Review of Systems  Constitutional: Negative for fever, chills, diaphoresis, activity change, appetite change and fatigue.  HENT: Negative for ear pain, nosebleeds, congestion, facial swelling, rhinorrhea, neck pain, neck stiffness and ear discharge.   Eyes: Negative for pain, discharge, redness, itching and visual disturbance.  Respiratory: Negative for cough, choking, chest tightness, shortness of breath, wheezing and stridor.   Cardiovascular: Negative for chest pain, palpitations and leg swelling.  Gastrointestinal: Negative for abdominal distention.  Genitourinary: Negative  for dysuria, urgency, frequency, hematuria, flank pain, decreased urine volume, difficulty urinating and dyspareunia.  Musculoskeletal: Negative for back pain, joint swelling, arthralgias and gait problem. Positive for neck pain. Neurological: Negative for dizziness, tremors, seizures, syncope, facial asymmetry, speech difficulty, weakness, light-headedness, numbness and headaches.  Hematological: Negative for adenopathy. Does not bruise/bleed easily.  Psychiatric/Behavioral: Negative for hallucinations, behavioral problems, confusion, dysphoric mood, decreased concentration and agitation.    Objective:  There were no vitals filed for this visit.  Physical Exam  Constitutional: Appears well-developed and well-nourished. No distress.  HENT: Normocephalic. External right and left ear normal. Oropharynx is clear and moist.  Eyes: Conjunctivae and EOM are normal. PERRLA, no scleral icterus.  Neck: Normal ROM. Neck supple. No JVD. No tracheal deviation. No thyromegaly.  CVS: RRR, S1/S2 +, no murmurs, no gallops, no carotid bruit.  Pulmonary: Effort and breath sounds normal, no stridor, rhonchi, wheezes, rales.  Abdominal: Soft. BS +,  no distension, tenderness, rebound or guarding.  Musculoskeletal: Normal range of motion. No edema and no tenderness.  Lymphadenopathy: No lymphadenopathy noted, cervical, inguinal. Neuro: Alert. Normal reflexes, muscle tone coordination. No cranial nerve deficit. Skin: Skin is warm and dry. No rash noted. Not diaphoretic. No erythema. No pallor.  Psychiatric: Normal mood and affect. Behavior, judgment, thought content normal.   Lab Results  Component Value Date   WBC 5.5 06/21/2008   HGB 12.8 06/21/2008   HCT 40.5 06/21/2008   MCV 81.5 06/21/2008   PLT 276  06/21/2008   Lab Results  Component Value Date   CREATININE 0.73 07/01/2013   BUN 14 07/01/2013   NA 140 07/01/2013   K 4.2 07/01/2013   CL 103 07/01/2013   CO2 28 07/01/2013    No results found for this  basename: HGBA1C   Lipid Panel     Component Value Date/Time   CHOL 214* 06/11/2013 1235   TRIG 249* 06/11/2013 1235   HDL 37* 06/11/2013 1235   CHOLHDL 5.8 06/11/2013 1235   VLDL 50* 06/11/2013 1235   LDLCALC 127* 06/11/2013 1235       Assessment and plan:   Patient Active Problem List   Diagnosis Date Noted  . HYPERTENSION 06/21/2008    Priority: High - We have discussed target BP range - I have advised pt to check BP regularly and to call us back if the numbers are higher than 140/90 - discussed the importance of compliance with medical therapy and diet  - continue norvasc  . Sinusitis, chronic 08/23/2013    Priority: Medium - allegra D and z pack prescribed

## 2013-09-23 ENCOUNTER — Other Ambulatory Visit: Payer: Self-pay | Admitting: Internal Medicine

## 2013-09-23 ENCOUNTER — Telehealth: Payer: Self-pay | Admitting: *Deleted

## 2013-09-23 ENCOUNTER — Ambulatory Visit: Payer: No Typology Code available for payment source | Attending: Internal Medicine | Admitting: Physical Therapy

## 2013-09-23 DIAGNOSIS — R200 Anesthesia of skin: Secondary | ICD-10-CM

## 2013-09-23 DIAGNOSIS — R32 Unspecified urinary incontinence: Secondary | ICD-10-CM

## 2013-09-23 DIAGNOSIS — M549 Dorsalgia, unspecified: Secondary | ICD-10-CM

## 2013-09-23 DIAGNOSIS — M542 Cervicalgia: Secondary | ICD-10-CM

## 2013-09-23 NOTE — Telephone Encounter (Signed)
Physical therapist, Tresa Endo, called in reference to a pt Michele Wood) for lower numbness and weakness. Pt has been incont. 1-2x. Pt saw Dr. Hyman Hopes. Pt's DOB is Jun 16, 2061.

## 2013-09-24 ENCOUNTER — Ambulatory Visit: Payer: No Typology Code available for payment source | Attending: Internal Medicine | Admitting: Internal Medicine

## 2013-09-24 ENCOUNTER — Telehealth: Payer: Self-pay | Admitting: Emergency Medicine

## 2013-09-24 ENCOUNTER — Encounter: Payer: Self-pay | Admitting: Internal Medicine

## 2013-09-24 VITALS — BP 122/85 | HR 96 | Temp 98.4°F | Resp 14 | Ht 62.0 in | Wt 261.0 lb

## 2013-09-24 DIAGNOSIS — E669 Obesity, unspecified: Secondary | ICD-10-CM

## 2013-09-24 DIAGNOSIS — M542 Cervicalgia: Secondary | ICD-10-CM | POA: Insufficient documentation

## 2013-09-24 DIAGNOSIS — M545 Low back pain, unspecified: Secondary | ICD-10-CM | POA: Insufficient documentation

## 2013-09-24 DIAGNOSIS — I10 Essential (primary) hypertension: Secondary | ICD-10-CM

## 2013-09-24 MED ORDER — NAPROXEN 500 MG PO TABS: 500.0000 mg | ORAL_TABLET | Freq: Two times a day (BID) | ORAL | Status: DC

## 2013-09-24 MED ORDER — ACETAMINOPHEN 500 MG PO TABS: 1000.0000 mg | ORAL_TABLET | Freq: Three times a day (TID) | ORAL | Status: DC | PRN

## 2013-09-24 NOTE — Addendum Note (Signed)
Addended by: Eddie North on: 09/24/2013 10:12 AM   Modules accepted: Orders

## 2013-09-24 NOTE — Patient Instructions (Signed)
Dolor de espalda, adultos   (Back Pain, Adult)   El dolor de espalda es frecuente. Con frecuencia mejora luego de algn tiempo. La causa suele no ser un peligro para la vida. La mayora de las personas aprende a controlarlo por sus propios medios.   CUIDADOS EN EL HOGAR    Mantngase fsicamente activo. Si puede, comience a dar cortas caminatas en un suelo plano. Trate de caminar un poco ms cada da.   Nopermanezca sentado, de pie ni conduzca automviles durante ms de 30 minutos seguidos. Nose quede en la cama.   Noevite los ejercicios ni el trabajo. La actividad puede ayudar a que la espalda se cure ms rpido.   Tenga cuidado al inclinarse o al levantar un objeto. Doble las rodillas, mantenga el objeto cerca de su cuerpo y no gire.   Duerma sobre un colchn firme. Acustese sobre un lado y doble las rodillas. Si se acuesta sobre la espalda, coloque una almohada debajo de las rodillas.   Tome la medicacin slo como le haya indicado el mdico.   Aplique hielo sobre la zona lesionada.   Ponga el hielo en una bolsa plstica.   Colquese una toalla entre la piel y la bolsa de hielo.   Deje la bolsa de hielo durante 15 a 20minutos 3 a 4 veces por da, durante los primeros 2 o 3 das. Luego puede ir alternando entre hielo y compresas calientes.   Consulte a su mdico sobre cul ejercicios o masajes puede hacer.   Evite sentirse ansioso o estresado. Encuentre la forma de enfrentar el estrs, como por ejemplo practicar ejercicios.  SOLICITE AYUDA DE INMEDIATO SI:    El dolor no desaparece aunque haga reposo o tome medicamentos para el dolor.   El dolor no desaparece en una semana.   Tiene nuevos problemas.   No se siente mejor.   El dolor se extiende a las piernas.   No puede controlar la orina o la materia fecal.   Siente que los brazos estn dbiles o pierde la sensibilidad (estn adormecidos).   Tiene malestar estomacal (nuseas) y vmitos.   Siente dolor abdominal.   Siente que se desvanece (se  desmaya).  ASEGRESE DE QUE:    Comprende estas instrucciones.   Controlar su enfermedad.   Solicitar ayuda de inmediato si no mejora o si empeora.  Document Released: 04/15/2011 Document Revised: 12/23/2011  [employer] Patient Information 2014 [employer].

## 2013-09-24 NOTE — Progress Notes (Addendum)
Patient ID: Michele Wood, female   DOB: 03/03/1961, 52 y.o.   MRN: [id number] Subjective: 52 y/o female known to the clinic with hx of HTN and alelrgies presets for a follow up visit. For past 1 months sher has been having  worsened low back pain with radiation down to the legs ( L>R) also causing tingling and numbness of the feet. She reports pain worsened with movement or bending.  Her back pain actually started 2 months back but has worsened over past 1 month. Denies bowel incontinence except that she has more frequent BMs now. She did have 2 episodes of urinary incontinence 3 weeks back. Denies dysuria. denies trauma to the back.  Regarding her neck pain she feels it has gotten worse and now radiates to her left arm to the  point that she has difficulty raising the arm up fully . She has difficulty picking up her grand kids due to pain. She also feels numbness over her left arm and hands.  Both the neck pain and lower back pain have affected her daily routine activities. She went to see physical therapy but was told to have her low back pain addressed first.   Objective:  Vitals  Reviewed and stable      Physical Exam:  General: Middle aged obese female in no acute distress. HEENT: no pallor, no icterus, moist oral mucosa,  Heart: Normal  s1 &s2  Regular rate and rhythm, without murmurs, rubs, gallops. Lungs: Clear to auscultation bilaterally. Abdomen: Soft, nontender, nondistended, positive bowel sounds. Extremities: Tender to palpation over the lower back, no swelling or deformity.SLTR negative. normal muscle power and tone or lower extremities, normal sensations, tender to palpation over cervical area with difficulty raising the arm above 90 Neuro: Alert, awake, oriented x3, nonfocal.   Lab Results:  Basic Metabolic Panel:    Component Value Date/Time   NA 140 07/01/2013 1131   K 4.2 07/01/2013 1131   CL 103 07/01/2013 1131   CO2 28 07/01/2013 1131   BUN 14  07/01/2013 1131   CREATININE 0.73 07/01/2013 1131   CREATININE 0.62 01/30/2009 2310   GLUCOSE 85 07/01/2013 1131   CALCIUM 9.2 07/01/2013 1131   CBC:    Component Value Date/Time   WBC 5.5 06/21/2008 2347   HGB 12.8 06/21/2008 2347   HCT 40.5 06/21/2008 2347   PLT 276 06/21/2008 2347   MCV 81.5 06/21/2008 2347   NEUTROABS 2.4 06/21/2008 2347   LYMPHSABS 2.5 06/21/2008 2347   MONOABS 0.4 06/21/2008 2347   EOSABS 0.3 06/21/2008 2347   BASOSABS 0.0 06/21/2008 2347    No results found for this or any previous visit (from the past 240 hour(s)).  Studies/Results: No results found.  Medications: Scheduled Meds: Continuous Infusions: PRN Meds:.    Assessment/Plan: Low back pain Symptoms worsened over past 1 month and affecting her routine activities. Pain radiating down to her legs read associated tingling and numbness. Also gives history of episodes of unity incontinence we will obtain MRI of the lumbar spine to rule out any lumbar disc protrusion or cord impingement. The x-ray of the lumbar spine done back in August showed some degenerative changes without any fracture.  Neck pain This has been going on for 2 months and she feels has worsened with difficulty raising the arm above the horizontal and pain radiating down to her left arm. X-ray of the cervical spine done 2 months ago showed degenerative changes over C5-C7. We will obtain an MRI of the cervical  spine given progression of her  Symptoms.  Patient advised to take extra strength Tylenol 3 times a day alternating with 1 or 2 tablets of Motrin. She takes exited at bedtime as it causes her sleepiness during the day. She also can apply topical anti-inflammatory to the area of the the pain. Also instructed on range of motion exercises. I will refer her to physical therapy.    hypertension Continue current blood pressure medications  Obesity Patient counseled on weight reduction.  Followup up after  MRI result  Caylyn Tedeschi 09/24/2013,  9:23 AM

## 2013-09-24 NOTE — Telephone Encounter (Signed)
Left message to confirm pt appt MRI 10/05/13 @ [facility] 1145 am

## 2013-09-24 NOTE — Progress Notes (Signed)
For a month pt is having pain in her lower back w/ pressure and numbness. Pt also reports of having incontinence.

## 2013-09-28 ENCOUNTER — Ambulatory Visit: Payer: No Typology Code available for payment source | Admitting: Physical Therapy

## 2013-10-05 ENCOUNTER — Ambulatory Visit (HOSPITAL_COMMUNITY)
Admission: RE | Admit: 2013-10-05 | Discharge: 2013-10-05 | Disposition: A | Discharge status: Home / Self Care | Payer: No Typology Code available for payment source | Source: Ambulatory Visit | Attending: Internal Medicine | Admitting: Internal Medicine

## 2013-10-05 DIAGNOSIS — M542 Cervicalgia: Secondary | ICD-10-CM

## 2013-10-05 DIAGNOSIS — R200 Anesthesia of skin: Secondary | ICD-10-CM

## 2013-10-05 DIAGNOSIS — R32 Unspecified urinary incontinence: Secondary | ICD-10-CM

## 2013-10-05 DIAGNOSIS — M549 Dorsalgia, unspecified: Secondary | ICD-10-CM

## 2013-10-05 DIAGNOSIS — M5380 Other specified dorsopathies, site unspecified: Secondary | ICD-10-CM | POA: Insufficient documentation

## 2013-10-05 DIAGNOSIS — M4802 Spinal stenosis, cervical region: Secondary | ICD-10-CM | POA: Insufficient documentation

## 2013-10-05 DIAGNOSIS — R209 Unspecified disturbances of skin sensation: Secondary | ICD-10-CM | POA: Insufficient documentation

## 2013-10-05 DIAGNOSIS — M47817 Spondylosis without myelopathy or radiculopathy, lumbosacral region: Secondary | ICD-10-CM | POA: Insufficient documentation

## 2013-10-21 ENCOUNTER — Telehealth: Payer: Self-pay | Admitting: *Deleted

## 2013-10-21 NOTE — Telephone Encounter (Signed)
Message copied by Joan Mayans on Thu Oct 21, 2013 10:47 AM ------      Message from: Angelica Chessman E      Created: Wed Oct 20, 2013  5:50 PM       Please inform patient that we need to see the in the clinic to discuss the result of the MRI ------

## 2013-10-26 ENCOUNTER — Ambulatory Visit: Payer: No Typology Code available for payment source | Attending: Internal Medicine | Admitting: Internal Medicine

## 2013-10-26 ENCOUNTER — Encounter (INDEPENDENT_AMBULATORY_CARE_PROVIDER_SITE_OTHER): Payer: Self-pay

## 2013-10-26 VITALS — BP 133/91 | HR 94 | Temp 98.7°F | Resp 14 | Ht 62.0 in | Wt 264.6 lb

## 2013-10-26 DIAGNOSIS — M549 Dorsalgia, unspecified: Secondary | ICD-10-CM | POA: Insufficient documentation

## 2013-10-26 DIAGNOSIS — I10 Essential (primary) hypertension: Secondary | ICD-10-CM | POA: Insufficient documentation

## 2013-10-26 LAB — CBC WITH DIFFERENTIAL/PLATELET
BASOS ABS: 0 10*3/uL (ref 0.0–0.1)
BASOS PCT: 0 % (ref 0–1)
Eosinophils Absolute: 0.1 10*3/uL (ref 0.0–0.7)
Eosinophils Relative: 2 % (ref 0–5)
HCT: 35.9 % — ABNORMAL LOW (ref 36.0–46.0)
Hemoglobin: 12.2 g/dL (ref 12.0–15.0)
LYMPHS PCT: 38 % (ref 12–46)
Lymphs Abs: 2.6 10*3/uL (ref 0.7–4.0)
MCH: 26.8 pg (ref 26.0–34.0)
MCHC: 34 g/dL (ref 30.0–36.0)
MCV: 78.7 fL (ref 78.0–100.0)
Monocytes Absolute: 0.5 10*3/uL (ref 0.1–1.0)
Monocytes Relative: 8 % (ref 3–12)
NEUTROS ABS: 3.7 10*3/uL (ref 1.7–7.7)
NEUTROS PCT: 52 % (ref 43–77)
PLATELETS: 311 10*3/uL (ref 150–400)
RBC: 4.56 MIL/uL (ref 3.87–5.11)
RDW: 14.2 % (ref 11.5–15.5)
WBC: 7 10*3/uL (ref 4.0–10.5)

## 2013-10-26 LAB — COMPLETE METABOLIC PANEL WITH GFR
ALT: 14 U/L (ref 0–35)
AST: 14 U/L (ref 0–37)
Albumin: 3.9 g/dL (ref 3.5–5.2)
Alkaline Phosphatase: 80 U/L (ref 39–117)
BILIRUBIN TOTAL: 0.3 mg/dL (ref 0.3–1.2)
BUN: 13 mg/dL (ref 6–23)
CHLORIDE: 104 meq/L (ref 96–112)
CO2: 28 meq/L (ref 19–32)
CREATININE: 0.85 mg/dL (ref 0.50–1.10)
Calcium: 9.8 mg/dL (ref 8.4–10.5)
GFR, Est African American: >89 mL/min
GFR, Est Non African American: 80 mL/min
Glucose, Bld: 90 mg/dL (ref 70–99)
Potassium: 4.3 mEq/L (ref 3.5–5.3)
SODIUM: 140 meq/L (ref 135–145)
TOTAL PROTEIN: 7.2 g/dL (ref 6.0–8.3)

## 2013-10-26 LAB — LIPID PANEL
Cholesterol: 202 mg/dL — ABNORMAL HIGH (ref 0–200)
HDL: 33 mg/dL — AB (ref >39)
LDL CALC: 133 mg/dL — AB (ref 0–99)
TRIGLYCERIDES: 178 mg/dL — AB (ref <150)
Total CHOL/HDL Ratio: 6.1 Ratio
VLDL: 36 mg/dL (ref 0–40)

## 2013-10-26 MED ORDER — TRAMADOL HCL 50 MG PO TABS: 100.0000 mg | ORAL_TABLET | Freq: Four times a day (QID) | ORAL | Status: DC | PRN

## 2013-10-26 MED ORDER — PREDNISONE 20 MG PO TABS: 40.0000 mg | ORAL_TABLET | Freq: Every day | ORAL | Status: AC

## 2013-10-26 NOTE — Progress Notes (Signed)
Pt is here for a f/u for back pain. Complains that the pain is really bothering pt. Hard to stand for long periods of time. Pain is really strong from lower back and neck x2 months. Pain medication is not helping. Pain scale today is 7. Pain will worsen with movement and feels like legs are collapsing; pain starts really slow then radiates. Requests medication for sinuses and depression.

## 2013-10-26 NOTE — Progress Notes (Signed)
Patient ID: Michele Wood, female   DOB: [birth year], 53 y.o.   MRN: [id number]   CC:  HPI:  53 y/o female known to the clinic with hx of HTN and alelrgies presets for a follow up visit. For past 1 months sher has been having worsened low back pain with radiation down to the legs ( L>R) also causing tingling and numbness of the feet. She reports pain worsened with movement or bending. Her back pain actually started 2 months back but has worsened over past 2 month. Denies bowel incontinence except that she has more frequent BMs now. She did have 2 episodes of urinary incontinence 3 weeks back. Denies dysuria. denies trauma to the back.  Regarding her neck pain she feels it has gotten worse and now radiates to her left arm to the point that she has difficulty raising the arm up fully . She has difficulty picking up her grand kids due to pain. She also feels numbness over her left arm and hands.  Both the neck pain and lower back pain have affected her daily routine activities. She went to see physical therapy but was told to have her low back pain addressed first. She has not had any steroids during the course of this pain.   MRI of the C-spine showed spondylotic changes C5-C6 C6-C7. Facet joint degenerative changes L3-L4, L4-L5, L5-S1 no significant cord compression    Allergies  Allergen Reactions  . Codeine Sulfate     REACTION: rash   Past Medical History  Diagnosis Date  . Hypertension    Current Outpatient Prescriptions on File Prior to Visit  Medication Sig Dispense Refill  . acetaminophen (TYLENOL) 500 MG tablet Take 2 tablets (1,000 mg total) by mouth every 8 (eight) hours as needed for moderate pain.  30 tablet  2  . amLODipine (NORVASC) 10 MG tablet Take 1 tablet (10 mg total) by mouth daily.  90 tablet  5  . cyclobenzaprine (FLEXERIL) 10 MG tablet Take 1 tablet (10 mg total) by mouth 2 (two) times daily as needed for muscle spasms.  30 tablet  5  .  lisinopril-hydrochlorothiazide (ZESTORETIC) 20-12.5 MG per tablet Take 2 tablets by mouth daily.  60 tablet  5  . azithromycin (ZITHROMAX) 250 MG tablet Use 500 mg today 08/23/2013 and then 250 mg a day until completed  6 tablet  0  . fexofenadine-pseudoephedrine (ALLEGRA-D 24 HOUR) 180-240 MG per 24 hr tablet Take 1 tablet by mouth daily.  30 tablet  5   No current facility-administered medications on file prior to visit.   No family history on file. History   Social History  . Marital Status: Married    Spouse Name: N/A    Number of Children: N/A  . Years of Education: N/A   Occupational History  . Not on file.   Social History Main Topics  . Smoking status: Never Smoker   . Smokeless tobacco: Not on file  . Alcohol Use: Yes     Comment: socially  . Drug Use:   . Sexual Activity:    Other Topics Concern  . Not on file   Social History Narrative  . No narrative on file    Review of Systems  Constitutional: Negative for fever, chills, diaphoresis, activity change, appetite change and fatigue.  HENT: Negative for ear pain, nosebleeds, congestion, facial swelling, rhinorrhea, neck pain, neck stiffness and ear discharge.   Eyes: Negative for pain, discharge, redness, itching and visual disturbance.  Respiratory: Negative  for cough, choking, chest tightness, shortness of breath, wheezing and stridor.   Cardiovascular: Negative for chest pain, palpitations and leg swelling.  Gastrointestinal: Negative for abdominal distention.  Genitourinary: Negative for dysuria, urgency, frequency, hematuria, flank pain, decreased urine volume, difficulty urinating and dyspareunia.  Musculoskeletal: Negative for back pain, joint swelling, arthralgias and gait problem.  Neurological: Negative for dizziness, tremors, seizures, syncope, facial asymmetry, speech difficulty, weakness, light-headedness, numbness and headaches.  Hematological: Negative for adenopathy. Does not bruise/bleed easily.   Psychiatric/Behavioral: Negative for hallucinations, behavioral problems, confusion, dysphoric mood, decreased concentration and agitation.    Objective:   Filed Vitals:   10/26/13 0910  BP: 133/91  Pulse: 94  Temp: 98.7 F (37.1 C)  Resp: 14    Physical Exam  Constitutional: Appears well-developed and well-nourished. No distress.  HENT: Normocephalic. External right and left ear normal. Oropharynx is clear and moist.  Eyes: Conjunctivae and EOM are normal. PERRLA, no scleral icterus.  Neck: Normal ROM. Neck supple. No JVD. No tracheal deviation. No thyromegaly.  CVS: RRR, S1/S2 +, no murmurs, no gallops, no carotid bruit.  Pulmonary: Effort and breath sounds normal, no stridor, rhonchi, wheezes, rales.  Abdominal: Soft. BS +,  no distension, tenderness, rebound or guarding.  Musculoskeletal: Normal range of motion. No edema and no tenderness.  Lymphadenopathy: No lymphadenopathy noted, cervical, inguinal. Neuro: Alert. Normal reflexes, muscle tone coordination. No cranial nerve deficit. Skin: Skin is warm and dry. No rash noted. Not diaphoretic. No erythema. No pallor.  Psychiatric: Normal mood and affect. Behavior, judgment, thought content normal.   Lab Results  Component Value Date   WBC 5.5 06/21/2008   HGB 12.8 06/21/2008   HCT 40.5 06/21/2008   MCV 81.5 06/21/2008   PLT 276 06/21/2008   Lab Results  Component Value Date   CREATININE 0.73 07/01/2013   BUN 14 07/01/2013   NA 140 07/01/2013   K 4.2 07/01/2013   CL 103 07/01/2013   CO2 28 07/01/2013    No results found for this basename: HGBA1C   Lipid Panel     Component Value Date/Time   CHOL 214* 06/11/2013 1235   TRIG 249* 06/11/2013 1235   HDL 37* 06/11/2013 1235   CHOLHDL 5.8 06/11/2013 1235   VLDL 50* 06/11/2013 1235   LDLCALC 127* 06/11/2013 1235       Assessment and plan:   Patient Active Problem List   Diagnosis Date Noted  . Sinusitis, chronic 08/23/2013  . HYPERTENSION 06/21/2008   Back pain Discontinue  Naprosyn Switched to tramadol Continue Flexeril Physical therapy referral list and pain clinic referral for epidural steroid injections   Morbid obesity Nutrition consult for weight loss  Hypertension controlled continue current medications, CMP panel  Follow up in 2 month      The patient was given clear instructions to go to ER or return to medical center if symptoms don't improve, worsen or new problems develop. The patient verbalized understanding. The patient was told to call to get any lab results if not heard anything in the next week.

## 2013-10-26 NOTE — Progress Notes (Signed)
LCSW met with the patient in order to assess need for mental health treatment.  Patient stated that she had previously had counseling and medication to manage her depressive symptoms.  LCSW gave patient resources to resume treatment in the community.    Christene Lye MSW, LCSW Duration 15 min

## 2013-10-28 ENCOUNTER — Ambulatory Visit: Payer: No Typology Code available for payment source | Attending: Internal Medicine | Admitting: Physical Therapy

## 2013-10-28 DIAGNOSIS — M545 Low back pain, unspecified: Secondary | ICD-10-CM | POA: Insufficient documentation

## 2013-10-28 DIAGNOSIS — M25559 Pain in unspecified hip: Secondary | ICD-10-CM | POA: Insufficient documentation

## 2013-10-28 DIAGNOSIS — IMO0001 Reserved for inherently not codable concepts without codable children: Secondary | ICD-10-CM | POA: Insufficient documentation

## 2013-10-28 DIAGNOSIS — M2550 Pain in unspecified joint: Secondary | ICD-10-CM | POA: Insufficient documentation

## 2013-10-28 DIAGNOSIS — R5381 Other malaise: Secondary | ICD-10-CM | POA: Insufficient documentation

## 2013-10-28 DIAGNOSIS — R293 Abnormal posture: Secondary | ICD-10-CM | POA: Insufficient documentation

## 2013-10-29 ENCOUNTER — Telehealth: Payer: Self-pay | Admitting: *Deleted

## 2013-10-29 MED ORDER — SIMVASTATIN 20 MG PO TABS: 20.0000 mg | ORAL_TABLET | Freq: Every day | ORAL | Status: DC

## 2013-10-29 NOTE — Telephone Encounter (Signed)
Message copied by Diya Gervasi, [place] R on Fri Oct 29, 2013 12:21 PM ------      Message from: Allyson Sabal MD, [facility]      Created: Fri Oct 29, 2013 11:55 AM       Notify patient of the labs are normal with the exception of cholesterol panel. Have called in a prescription of simvastatin 20 mg to her pharmacy ------

## 2013-10-29 NOTE — Telephone Encounter (Signed)
Left a voicemail for pt to give us a call back. 

## 2013-10-29 NOTE — Addendum Note (Signed)
Addended by: Allyson Sabal MD, Ascencion Dike on: 10/29/2013 11:55 AM   Modules accepted: Orders

## 2013-11-01 ENCOUNTER — Ambulatory Visit: Payer: No Typology Code available for payment source | Admitting: Rehabilitation

## 2013-11-03 ENCOUNTER — Ambulatory Visit: Payer: No Typology Code available for payment source | Admitting: Rehabilitation

## 2013-11-08 ENCOUNTER — Ambulatory Visit: Payer: No Typology Code available for payment source | Admitting: Rehabilitation

## 2013-11-11 ENCOUNTER — Ambulatory Visit: Payer: No Typology Code available for payment source | Admitting: Rehabilitation

## 2013-11-15 ENCOUNTER — Ambulatory Visit: Payer: No Typology Code available for payment source | Attending: Internal Medicine

## 2013-11-15 ENCOUNTER — Encounter: Payer: No Typology Code available for payment source | Attending: Internal Medicine | Admitting: Dietician

## 2013-11-15 ENCOUNTER — Encounter: Payer: Self-pay | Admitting: Dietician

## 2013-11-15 VITALS — Ht 62.0 in | Wt 261.0 lb

## 2013-11-15 DIAGNOSIS — Z6841 Body Mass Index (BMI) 40.0 and over, adult: Secondary | ICD-10-CM | POA: Insufficient documentation

## 2013-11-15 DIAGNOSIS — E669 Obesity, unspecified: Secondary | ICD-10-CM | POA: Insufficient documentation

## 2013-11-15 DIAGNOSIS — Z713 Dietary counseling and surveillance: Secondary | ICD-10-CM | POA: Insufficient documentation

## 2013-11-15 DIAGNOSIS — I10 Essential (primary) hypertension: Secondary | ICD-10-CM | POA: Insufficient documentation

## 2013-11-15 DIAGNOSIS — M549 Dorsalgia, unspecified: Secondary | ICD-10-CM | POA: Insufficient documentation

## 2013-11-15 NOTE — Patient Instructions (Addendum)
Goals:  Not have so many potato chips. Instead have yogurt or fruit if you're feeling hungry.  Try to have less fast food. Instead have Kid's Meal or grilled chicken.  Eat 3 meals/day, try to avoid meal skipping; have a protein food with a carbohydrate food as a snack   Increase protein rich foods  Follow "Plate Method" for portion control; fill up half your plate with vegetables  Limit carbohydrate1-2 servings/meal   Choose more whole grains, lean protein, low-fat dairy, and fruits/non-starchy vegetables.   Aim for >30 min of physical activity daily; talk to doctor/physical therapist about water exercises  Limit sugar-sweetened beverages and concentrated sweets  Look for low-sodium [place] bacon

## 2013-11-15 NOTE — Progress Notes (Signed)
  Medical Nutrition Therapy:  Appt start time: 1130 end time:  1230.   Assessment: Michele Wood is here today because her doctor suggested she lose some weight to help her back pain and hypertension. She reports that she was very thin as a child and her mom tried to get her to eat more so she would gain weight. She gained weight rapidly in middle school and began running to lose the weight she gained; Michele Wood lost 40 pounds in middle school. She regained the weight in high school and jumped to 230 pounds after her pregnancies in her 34s. Michele Wood has since struggled to maintain a healthy weight. She fluctuates between 250 and 260 pounds. The patient reports that she has always enjoyed being active and used to walk everywhere. However, with her back pain, Michele Wood struggles to remain active. She is even having trouble cooking, something she has always liked to do. She meets with a Physical Therapist twice a week.  Michele Wood reports that she knows when she is hungry but feels that she sometimes waits too long to eat. She reports a sensitive stomach recently. She becomes nauseated when she takes Tramadol, even when she takes it with food. Michele Wood expressed feeling a lack of control in her life right now. She is currently controlling her eating by planning her day, including her foods, ahead of time.   Preferred Learning Style:  Auditory  Hands on  Learning Readiness:  Ready  MEDICATIONS: see list   DIETARY INTAKE:  Avoided foods include milk and ice cream (can have yogurt and cheese).    24-hr recall:  B ( AM): oatmeal with regular soda  Snk ( AM): potato chips, Swiss rolls  L ( PM): Usually skips Snk ( PM): none (usually has an early dinner) D (3-5 PM): baked chicken with soup or [place] meatloaf; trying to cut back on beef, green beans, broccoli, vegetable soup Snk ( PM): potato chips or fruit Beverages: regular soda, juice, flavored water  Usual physical activity: unable to do any weight bearing  exercise per MD, does physical therapy 2x a week  Estimated energy needs: 1600 calories   Progress Towards Goal(s):  In progress.   Nutritional Diagnosis:  Lyons-3.3 Overweight/obesity As related to physical disability and excessive carbohydrate intake (juice and regular soda).  As evidenced by BMI 47.7.    Intervention:  Nutrition education provided.  Teaching Method Utilized: Visual Auditory Hands on  Handouts given during visit include:  MyPlate  Low sodium food list  Low sodium flavoring tips  Barriers to learning/adherence to lifestyle change: physical disability  Demonstrated degree of understanding via:  Teach Back   Monitoring/Evaluation:  Dietary intake, exercise, blood pressure, and body weight in 3 month(s).

## 2013-11-17 ENCOUNTER — Emergency Department (HOSPITAL_COMMUNITY)
Admission: EM | Admit: 2013-11-17 | Discharge: 2013-11-17 | Disposition: A | Discharge status: Home / Self Care | Payer: No Typology Code available for payment source | Attending: Emergency Medicine | Admitting: Emergency Medicine

## 2013-11-17 ENCOUNTER — Encounter (HOSPITAL_COMMUNITY): Payer: Self-pay | Admitting: Emergency Medicine

## 2013-11-17 ENCOUNTER — Encounter: Payer: Self-pay | Admitting: Physical Therapy

## 2013-11-17 DIAGNOSIS — Z79899 Other long term (current) drug therapy: Secondary | ICD-10-CM | POA: Insufficient documentation

## 2013-11-17 DIAGNOSIS — E669 Obesity, unspecified: Secondary | ICD-10-CM | POA: Insufficient documentation

## 2013-11-17 DIAGNOSIS — I10 Essential (primary) hypertension: Secondary | ICD-10-CM | POA: Insufficient documentation

## 2013-11-17 DIAGNOSIS — J329 Chronic sinusitis, unspecified: Secondary | ICD-10-CM | POA: Insufficient documentation

## 2013-11-17 DIAGNOSIS — K115 Sialolithiasis: Secondary | ICD-10-CM | POA: Insufficient documentation

## 2013-11-17 DIAGNOSIS — K1120 Sialoadenitis, unspecified: Secondary | ICD-10-CM | POA: Insufficient documentation

## 2013-11-17 MED ORDER — CEPHALEXIN 500 MG PO CAPS: 500.0000 mg | ORAL_CAPSULE | Freq: Four times a day (QID) | ORAL | Status: DC

## 2013-11-17 MED ORDER — FLUTICASONE PROPIONATE 50 MCG/ACT NA SUSP: 2.0000 | Freq: Every day | NASAL | Status: DC

## 2013-11-17 NOTE — ED Provider Notes (Signed)
CSN: [id number]     Arrival date & time 11/17/13  1028 History   First MD Initiated Contact with Patient 11/17/13 1107     Chief Complaint  Patient presents with  . throat pain   . Otalgia   (Consider location/radiation/quality/duration/timing/severity/associated sxs/prior Treatment) Patient is a 53 y.o. female presenting with ear pain. The history is provided by the patient and medical records. No language interpreter was used.  Otalgia Associated symptoms: no abdominal pain, no cough, no fever, no headaches, no neck pain, no rash, no rhinorrhea and no vomiting     Michele Wood is a 53 y.o. female  with a hx of HTN, obesity presents to the Emergency Department complaining of gradual, persistent, progressively worsening left sided facial pain with associated pain with eating and left otalgia onset yesterday morning.  Pt reports slowly increasing facial swelling since that time without difficulty swallowing or discrete sore throat.  Pt also relates pain in her jaw, but denies trismus. Nothing makes it better and eating makes it worse.  Pt denies fever, chills, headache, neck pain, chest pain, SOB, abd pain, N/V/D, difficulty swallowing, difficulty breathing.  Pt reports she has her tonsils and adenoids.    Past Medical History  Diagnosis Date  . Hypertension   . Obesity   . Sleep apnea    Past Surgical History  Procedure Laterality Date  . Abdominal hysterectomy     Family History  Problem Relation Age of Onset  . Hypertension Other    History  Substance Use Topics  . Smoking status: Never Smoker   . Smokeless tobacco: Not on file  . Alcohol Use: Yes     Comment: socially   OB History   Grav Para Term Preterm Abortions TAB SAB Ect Mult Living                 Review of Systems  Constitutional: Negative for fever, chills and appetite change.  HENT: Positive for ear pain and facial swelling. Negative for dental problem, drooling, nosebleeds, postnasal drip,  rhinorrhea and trouble swallowing.   Eyes: Negative for pain and redness.  Respiratory: Negative for cough and wheezing.   Cardiovascular: Negative for chest pain.  Gastrointestinal: Negative for nausea, vomiting and abdominal pain.  Musculoskeletal: Negative for neck pain and neck stiffness.  Skin: Negative for color change and rash.  Neurological: Negative for weakness, light-headedness and headaches.  All other systems reviewed and are negative.    Allergies  Codeine sulfate and Milk-related compounds  Home Medications   Current Outpatient Rx  Name  Route  Sig  Dispense  Refill  . amLODipine (NORVASC) 10 MG tablet   Oral   Take 10 mg by mouth every morning.         . cyclobenzaprine (FLEXERIL) 10 MG tablet   Oral   Take 1 tablet (10 mg total) by mouth 2 (two) times daily as needed for muscle spasms.   30 tablet   5   . lisinopril-hydrochlorothiazide (PRINZIDE,ZESTORETIC) 20-12.5 MG per tablet   Oral   Take 2 tablets by mouth every morning.          Marland Kitchen oxymetazoline (AFRIN) 0.05 % nasal spray   Each Nare   Place 2 sprays into both nostrils 2 (two) times daily as needed for congestion.         . traMADol (ULTRAM) 50 MG tablet   Oral   Take 100 mg by mouth every 6 (six) hours as needed (For pain.).          Marland Kitchen  cephALEXin (KEFLEX) 500 MG capsule   Oral   Take 1 capsule (500 mg total) by mouth 4 (four) times daily.   40 capsule   0   . fluticasone (FLONASE) 50 MCG/ACT nasal spray   Each Nare   Place 2 sprays into both nostrils daily.   16 g   2    BP 144/82  Pulse 87  Temp(Src) 97.9 F (36.6 C) (Oral)  Resp 16  SpO2 98%  LMP 09/01/2013 Physical Exam  Nursing note and vitals reviewed. Constitutional: She appears well-developed and well-nourished.  HENT:  Head: Normocephalic.    Right Ear: Tympanic membrane, external ear and ear canal normal.  Left Ear: Tympanic membrane, external ear and ear canal normal.  Nose: Nose normal. No mucosal edema or  rhinorrhea. Right sinus exhibits no maxillary sinus tenderness and no frontal sinus tenderness. Left sinus exhibits no maxillary sinus tenderness and no frontal sinus tenderness.  Mouth/Throat: Uvula is midline and oropharynx is clear and moist. Mucous membranes are dry (Left side of mouth slightly drier). No oral lesions. Normal dentition. No uvula swelling, lacerations or dental caries. No oropharyngeal exudate, posterior oropharyngeal edema, posterior oropharyngeal erythema or tonsillar abscesses.  Swelling over the parotid gland and pain to palpation in the area No trismus No evidence of oral/dental/gingival abscess No induration to the floor of the mouth No tonsillar erythema, edema or exudate No evidence of PTA  Eyes: Conjunctivae are normal. Pupils are equal, round, and reactive to light. Right eye exhibits no discharge. Left eye exhibits no discharge.  Neck: Normal range of motion, full passive range of motion without pain and phonation normal. Neck supple. No spinous process tenderness and no muscular tenderness present. No rigidity. Normal range of motion present.  Phonation normal No stridor Patent airway, handling secretions without difficulty, swallowing without difficulty, tolerating by mouth in the department.  Cardiovascular: Normal rate, regular rhythm and normal heart sounds.   Pulmonary/Chest: Effort normal and breath sounds normal. No stridor. No respiratory distress. She has no wheezes.  Abdominal: Soft. Bowel sounds are normal. She exhibits no distension. There is no tenderness.  Lymphadenopathy:    She has no cervical adenopathy.  Neurological: She is alert.  Skin: Skin is warm and dry.  Psychiatric: She has a normal mood and affect.    ED Course  Procedures (including critical care time) Labs Review Labs Reviewed - No data to display Imaging Review No results found.  EKG Interpretation   None       MDM   1. Sialoadenitis   2. Sialolithiasis   3.  Sinusitis, chronic   4. Parotitis    Michele Wood presents with swelling and tenderness to the L parotid gland only with a decrease in saliva in the left side of the mouth.  NO visible stone at the salivary duct.  Mild swelling and erythema of the left face without induration or palpable abscess.  No lymphadenopathy or evidence of Mumps.  No trismus or evidence of dental infection.  No tonsillar or peritonsillar swelling, no evidence of peritonsillar abscess. Patient without stridor, handling secretions and swallowing without difficulty.  She is afebrile and not tachycardic.  Likely sialolithiasis with an accompanying sialoadenitis.  We'll give Keflex.  Patient also reports history of chronic sinusitis and some ear pain.  Reports she used to use Flonase which worked well but has not had it in a long time because she had no more refills. Will refill her Flonase.  No pain to palpation of  the sinuses, no purulent rhinorrhea.  No evidence of acute sinusitis.  Recommend close primary care followup.  Patient also given resources for ear nose and throat as needed if her symptoms persist.  It has been determined that no acute conditions requiring further emergency intervention are present at this time. The patient/guardian have been advised of the diagnosis and plan. We have discussed signs and symptoms that warrant return to the ED, such as changes or worsening in symptoms.   Vital signs are stable at discharge.   BP 144/82  Pulse 87  Temp(Src) 97.9 F (36.6 C) (Oral)  Resp 16  SpO2 98%  LMP 09/01/2013  Patient/guardian has voiced understanding and agreed to follow-up with the PCP or specialist.        Abigail Butts, PA-C 11/17/13 1140

## 2013-11-17 NOTE — Discharge Instructions (Signed)
1. Medications: keflex, flonase, usual home medications 2. Treatment: rest, drink plenty of fluids, eat sour candy (lemon drops) as you are able throughout the day to help extract the stone 3. Follow Up: Please followup with your primary doctor for discussion of your diagnoses and further evaluation after today's visit; if symptoms persist please follow-up with ENT; return to the ED for worsening symptoms including fever, chills, difficulty breathing or difficulty swallowing   Salivary Stone Your exam shows you have a stone in one of your saliva glands. These small stones form around a mucous plug in the ducts of the glands and cause the saliva in the gland to be blocked. This makes the gland swollen and painful, especially when you eat. If repeated episodes occur, the gland can become infected. Sometimes these stones can be seen on x-ray. Treatment includes stimulating the production of saliva to push the stone out. You should suck on a lemon or sour candies several times daily. Antibiotic medicine may be needed if the gland is infected. Increasing fluids, applying warm compresses to the swollen area 3-4 times daily, and massaging the gland from back to front may encourage drainage and passage of the stone. Surgical treatment to remove the stone is sometimes necessary, so proper medical follow up is very important. Call your doctor for an appointment as recommended. Call right away if you have a high fever, severe headache, vomiting, uncontrolled pain, or other serious symptoms. Document Released: 11/07/2004 Document Revised: 12/23/2011 Document Reviewed: 09/30/2005 [employer] Patient Information 2014 [place].   Parotitis Parotitis is soreness and swelling (inflammation) of one or both parotid glands. The parotid glands produce saliva. They are located on each side of the face, below and in front of the earlobes. The saliva produced comes out of tiny openings (ducts) inside the cheeks. In most  cases, parotitis goes away over time or with treatment. If your parotitis is caused by certain long-term (chronic) diseases, it may come back again.  CAUSES  Parotitis can be caused by:  Viral infections. Mumps is one viral infection that can cause parotitis.  Bacterial infections.  Blockage of the salivary ducts due to a salivary stone.  Narrowing of the salivary ducts.  Swelling of the salivary ducts.  Dehydration.  Autoimmune conditions, such as sarcoidosis or Sjogren's syndrome.  Air from activities such as scuba diving, glass blowing, or playing an instrument (rare).  Human immunodeficiency virus (HIV) or acquired immunodeficiency syndrome (AIDS).  Tuberculosis. SYMPTOMS   The ears may appear to be pushed up and out from their normal position.  Redness (erythema) of the skin over the parotid glands.  Pain and tenderness over the parotid glands.  Swelling in the parotid gland area.  Yellowish-white fluid (pus) coming from the ducts inside the cheeks.  Dry mouth.  Bad taste in the mouth. DIAGNOSIS  Your caregiver may determine that you have parotitis based on your symptoms and a physical exam. A sample of fluid may also be taken from the parotid gland and tested to find the cause of your infection. X-rays or computed tomography (CT) scans may be taken if your caregiver thinks you might have a salivary stone blocking your salivary duct. TREATMENT  Treatment varies depending upon the cause of your parotitis. If your parotitis is caused by mumps, no treatment is needed. The condition will go away on its own after 7 to 10 days. In other cases, treatment may include:  Antibiotics if your infection was caused by bacteria.  Pain medicines.  Gland  massage.  Eating sour candy to increase your saliva production.  Removal of salivary stones. Your caregiver may flush stones out with fluids or remove them with tweezers.  Surgery to remove the parotid glands. HOME CARE  INSTRUCTIONS   If you were given antibiotics, take them as directed. Finish them even if you start to feel better.  Put warm compresses on the sore area.  Only take over-the-counter or prescription medicines for pain, discomfort, or fever as directed by your caregiver.  Drink enough fluids to keep your urine clear or pale yellow. SEEK IMMEDIATE MEDICAL CARE IF:   You have increasing pain or swelling that is not controlled with medicine.  You have a fever. MAKE SURE YOU:  Understand these instructions.  Will watch your condition.  Will get help right away if you are not doing well or get worse. Document Released: 03/22/2002 Document Revised: 12/23/2011 Document Reviewed: 08/26/2011 [facility] Patient Information 2014 [place], [place].

## 2013-11-17 NOTE — Progress Notes (Signed)
[facility] CL spoke with patient about [employer]. Patient confirmed her PCP was [facility]. Contacted pcp to set patient up with follow-up apt.

## 2013-11-17 NOTE — ED Notes (Signed)
Per pt, states left ear pain and throat pain since yesterday

## 2013-11-18 ENCOUNTER — Encounter: Payer: Self-pay | Admitting: Physical Therapy

## 2013-11-18 NOTE — ED Provider Notes (Signed)
Medical screening examination/treatment/procedure(s) were performed by non-physician practitioner and as supervising physician I was immediately available for consultation/collaboration.  EKG Interpretation   None         Houston Siren III, MD 11/18/13 [phone]

## 2013-11-23 ENCOUNTER — Ambulatory Visit: Payer: No Typology Code available for payment source | Attending: Internal Medicine | Admitting: Physical Therapy

## 2013-11-23 DIAGNOSIS — R293 Abnormal posture: Secondary | ICD-10-CM | POA: Insufficient documentation

## 2013-11-23 DIAGNOSIS — M25559 Pain in unspecified hip: Secondary | ICD-10-CM | POA: Insufficient documentation

## 2013-11-23 DIAGNOSIS — M2550 Pain in unspecified joint: Secondary | ICD-10-CM | POA: Insufficient documentation

## 2013-11-23 DIAGNOSIS — M545 Low back pain, unspecified: Secondary | ICD-10-CM | POA: Insufficient documentation

## 2013-11-23 DIAGNOSIS — R5381 Other malaise: Secondary | ICD-10-CM | POA: Insufficient documentation

## 2013-11-23 DIAGNOSIS — IMO0001 Reserved for inherently not codable concepts without codable children: Secondary | ICD-10-CM | POA: Insufficient documentation

## 2013-11-25 ENCOUNTER — Ambulatory Visit: Payer: No Typology Code available for payment source | Admitting: Physical Therapy

## 2013-11-30 ENCOUNTER — Encounter: Payer: No Typology Code available for payment source | Admitting: Physical Therapy

## 2013-12-02 ENCOUNTER — Encounter (HOSPITAL_COMMUNITY): Payer: Self-pay | Admitting: Emergency Medicine

## 2013-12-02 ENCOUNTER — Emergency Department (HOSPITAL_COMMUNITY)
Admission: EM | Admit: 2013-12-02 | Discharge: 2013-12-02 | Disposition: A | Discharge status: Home / Self Care | Payer: No Typology Code available for payment source | Attending: Emergency Medicine | Admitting: Emergency Medicine

## 2013-12-02 ENCOUNTER — Encounter: Payer: No Typology Code available for payment source | Admitting: Rehabilitative and Restorative Service Providers"

## 2013-12-02 DIAGNOSIS — Z792 Long term (current) use of antibiotics: Secondary | ICD-10-CM | POA: Insufficient documentation

## 2013-12-02 DIAGNOSIS — M79609 Pain in unspecified limb: Secondary | ICD-10-CM | POA: Insufficient documentation

## 2013-12-02 DIAGNOSIS — G8929 Other chronic pain: Secondary | ICD-10-CM | POA: Insufficient documentation

## 2013-12-02 DIAGNOSIS — M79603 Pain in arm, unspecified: Secondary | ICD-10-CM

## 2013-12-02 DIAGNOSIS — I10 Essential (primary) hypertension: Secondary | ICD-10-CM | POA: Insufficient documentation

## 2013-12-02 DIAGNOSIS — E669 Obesity, unspecified: Secondary | ICD-10-CM | POA: Insufficient documentation

## 2013-12-02 DIAGNOSIS — IMO0002 Reserved for concepts with insufficient information to code with codable children: Secondary | ICD-10-CM | POA: Insufficient documentation

## 2013-12-02 HISTORY — DX: Other chronic pain: G89.29

## 2013-12-02 HISTORY — DX: Dorsalgia, unspecified: M54.9

## 2013-12-02 MED ORDER — NAPROXEN SODIUM 275 MG PO TABS: 275.0000 mg | ORAL_TABLET | Freq: Once | ORAL | Status: DC

## 2013-12-02 MED ORDER — ACETAMINOPHEN 325 MG PO TABS
650.0000 mg | ORAL_TABLET | Freq: Once | ORAL | Status: AC
  Administered 2013-12-02: 650 mg via ORAL
  Filled 2013-12-02: qty 2

## 2013-12-02 MED ORDER — METHOCARBAMOL 500 MG PO TABS
500.0000 mg | ORAL_TABLET | Freq: Once | ORAL | Status: AC
  Administered 2013-12-02: 500 mg via ORAL
  Filled 2013-12-02: qty 1

## 2013-12-02 NOTE — Discharge Instructions (Signed)
Continue taking tramadol.  Advise taking tylenol with it for increased pain relief. May apply heat and/or ice for 20 minutes at a time to help with pain. Follow up with your primary care doctor today. Return to the ED for new concerns.

## 2013-12-02 NOTE — ED Notes (Signed)
Pt reports sharp radiating pain to R arm that started yesterday afternoon. Pt denies any injury to area. Pt has full movement of arm.

## 2013-12-02 NOTE — ED Provider Notes (Signed)
CSN: [id number]     Arrival date & time 12/02/13  0207 History   First MD Initiated Contact with Patient 12/02/13 0239     Chief Complaint  Patient presents with  . Arm Pain     (Consider location/radiation/quality/duration/timing/severity/associated sxs/prior Treatment) The history is provided by the patient and medical records.   This is a 53 year old female with past medical history significant for hypertension, obesity, sleep apnea, chronic back pain, presented to the ED for right arm pain, onset yesterday afternoon. Patient denies any recent injury, trauma, or falls. She has full range of motion of her arm. Pain is described as an intermittent, cramping sensation along her bicep.  She denies any arm swelling, numbness, or paresthesias. No radiation into the chest. Patient has taken her usual pain medications including tramadol and Flexeril.  On arrival, patient denies any current symptoms but states she was concerned since her pain was not directly related to an injury.  Past Medical History  Diagnosis Date  . Hypertension   . Obesity   . Sleep apnea   . Chronic back pain    Past Surgical History  Procedure Laterality Date  . Abdominal hysterectomy     Family History  Problem Relation Age of Onset  . Hypertension Other    History  Substance Use Topics  . Smoking status: Never Smoker   . Smokeless tobacco: Not on file  . Alcohol Use: Yes     Comment: socially   OB History   Grav Para Term Preterm Abortions TAB SAB Ect Mult Living                 Review of Systems  Musculoskeletal: Positive for arthralgias.  All other systems reviewed and are negative.      Allergies  Codeine sulfate and Milk-related compounds  Home Medications   Current Outpatient Rx  Name  Route  Sig  Dispense  Refill  . amLODipine (NORVASC) 10 MG tablet   Oral   Take 10 mg by mouth every morning.         . cyclobenzaprine (FLEXERIL) 10 MG tablet   Oral   Take 1 tablet (10 mg  total) by mouth 2 (two) times daily as needed for muscle spasms.   30 tablet   5   . fluticasone (FLONASE) 50 MCG/ACT nasal spray   Each Nare   Place 2 sprays into both nostrils daily.   16 g   2   . lisinopril-hydrochlorothiazide (PRINZIDE,ZESTORETIC) 20-12.5 MG per tablet   Oral   Take 2 tablets by mouth every morning.          . traMADol (ULTRAM) 50 MG tablet   Oral   Take 100 mg by mouth every 6 (six) hours as needed (For pain.).          Marland Kitchen cephALEXin (KEFLEX) 500 MG capsule   Oral   Take 1 capsule (500 mg total) by mouth 4 (four) times daily.   40 capsule   0    BP 128/68  Pulse 118  Temp(Src) 97.9 F (36.6 C) (Oral)  Resp 22  Ht 5\' 2"  (1.575 m)  Wt 261 lb (118.389 kg)  BMI 47.73 kg/m2  SpO2 96%  LMP 09/01/2013  Physical Exam  Nursing note and vitals reviewed. Constitutional: She is oriented to person, place, and time. She appears well-developed and well-nourished.  HENT:  Head: Normocephalic and atraumatic.  Mouth/Throat: Oropharynx is clear and moist.  Eyes: Conjunctivae and EOM are normal. Pupils are  equal, round, and reactive to light.  Neck: Normal range of motion.  Cardiovascular: Normal rate, regular rhythm and normal heart sounds.   Pulmonary/Chest: Effort normal and breath sounds normal. No respiratory distress. She has no wheezes.  Musculoskeletal: Normal range of motion.       Arms: Right arm with cramping sensations along medial bicep/tricep without TTP or noted deformities; no bulges or signs of tendon rupture; no swelling or erythema; full ROM of right shoulder, elbow, and wrist without pain; strong radial pulse and cap refill; sensation intact  Neurological: She is alert and oriented to person, place, and time.  Skin: Skin is warm and dry.  Psychiatric: She has a normal mood and affect.    ED Course  Procedures (including critical care time) Labs Review Labs Reviewed - No data to display Imaging Review No results found.  EKG  Interpretation   None       MDM   Final diagnoses:  Arm pain   Right arm with cramping sensations along bicep and tricep without recent trauma. No tenderness to palpation or gross deformity to suggest tendon rupture. No swelling or erythema to suggest DVT.  On review of medical records pt had recent blood work with normal K+, no prior hx of hypokalemia.  Do not feel that imaging will change plan of care.  Pt will continue taking her tramadol, advised to add tylenol.  FU with PCP today.  Discussed plan with pt, they agreed.  Return precautions advised.   Larene Pickett, PA-C 12/02/13 [phone]

## 2013-12-02 NOTE — ED Provider Notes (Signed)
Medical screening examination/treatment/procedure(s) were performed by non-physician practitioner and as supervising physician I was immediately available for consultation/collaboration.    Kalman Drape, MD 12/02/13 0400

## 2013-12-03 ENCOUNTER — Encounter: Payer: Self-pay | Admitting: Internal Medicine

## 2013-12-03 ENCOUNTER — Ambulatory Visit: Payer: No Typology Code available for payment source | Attending: Internal Medicine | Admitting: Internal Medicine

## 2013-12-03 VITALS — BP 138/100 | HR 110 | Temp 98.3°F | Resp 16 | Ht 62.0 in | Wt 263.0 lb

## 2013-12-03 DIAGNOSIS — M79609 Pain in unspecified limb: Secondary | ICD-10-CM | POA: Insufficient documentation

## 2013-12-03 DIAGNOSIS — E669 Obesity, unspecified: Secondary | ICD-10-CM | POA: Insufficient documentation

## 2013-12-03 DIAGNOSIS — M25529 Pain in unspecified elbow: Secondary | ICD-10-CM

## 2013-12-03 DIAGNOSIS — Z79899 Other long term (current) drug therapy: Secondary | ICD-10-CM | POA: Insufficient documentation

## 2013-12-03 DIAGNOSIS — J329 Chronic sinusitis, unspecified: Secondary | ICD-10-CM | POA: Insufficient documentation

## 2013-12-03 DIAGNOSIS — I10 Essential (primary) hypertension: Secondary | ICD-10-CM | POA: Insufficient documentation

## 2013-12-03 LAB — CBC WITH DIFFERENTIAL/PLATELET
BASOS ABS: 0 10*3/uL (ref 0.0–0.1)
BASOS PCT: 0 % (ref 0–1)
Eosinophils Absolute: 0.1 10*3/uL (ref 0.0–0.7)
Eosinophils Relative: 1 % (ref 0–5)
HEMATOCRIT: 36.4 % (ref 36.0–46.0)
Hemoglobin: 12.4 g/dL (ref 12.0–15.0)
Lymphocytes Relative: 39 % (ref 12–46)
Lymphs Abs: 2.8 10*3/uL (ref 0.7–4.0)
MCH: 26.5 pg (ref 26.0–34.0)
MCHC: 34.1 g/dL (ref 30.0–36.0)
MCV: 77.8 fL — ABNORMAL LOW (ref 78.0–100.0)
MONO ABS: 0.5 10*3/uL (ref 0.1–1.0)
Monocytes Relative: 7 % (ref 3–12)
NEUTROS ABS: 3.8 10*3/uL (ref 1.7–7.7)
NEUTROS PCT: 53 % (ref 43–77)
PLATELETS: 371 10*3/uL (ref 150–400)
RBC: 4.68 MIL/uL (ref 3.87–5.11)
RDW: 14.1 % (ref 11.5–15.5)
WBC: 7.2 10*3/uL (ref 4.0–10.5)

## 2013-12-03 LAB — CK: CK TOTAL: 80 U/L (ref 7–177)

## 2013-12-03 LAB — TSH: TSH: 2.319 u[IU]/mL (ref 0.350–4.500)

## 2013-12-03 MED ORDER — MELOXICAM 15 MG PO TABS: 15.0000 mg | ORAL_TABLET | Freq: Every day | ORAL | Status: DC

## 2013-12-03 NOTE — Progress Notes (Signed)
HFU Pt woke up 2 days ago with extreme pain in her right arm.

## 2013-12-03 NOTE — Progress Notes (Signed)
Patient ID: Michele Wood, female   DOB: 1961-01-25, 53 y.o.   MRN: [id number]   CC:  HPI: 53 year old female with past medical history significant for hypertension, obesity, sleep apnea, chronic back pain, presented to the ED on 2/19 for right arm pain. The pain started on Sunday.Pain is described as an intermittent, cramping sensation along her bicep. She denies any arm swelling, numbness, or paresthesias. No radiation into the chest. Patient has taken her usual pain medications including tramadol and Flexeril.  She describes the pain is mostly located in her triceps region. No history of trauma. She denies history of heavy alcohol use or drug use No prior history of DVT   Allergies  Allergen Reactions  . Codeine Sulfate     REACTION: rash  . Milk-Related Compounds Other (See Comments)    Diarrhea and stomach upset   Past Medical History  Diagnosis Date  . Hypertension   . Obesity   . Sleep apnea   . Chronic back pain    Current Outpatient Prescriptions on File Prior to Visit  Medication Sig Dispense Refill  . amLODipine (NORVASC) 10 MG tablet Take 10 mg by mouth every morning.      . cyclobenzaprine (FLEXERIL) 10 MG tablet Take 1 tablet (10 mg total) by mouth 2 (two) times daily as needed for muscle spasms.  30 tablet  5  . fluticasone (FLONASE) 50 MCG/ACT nasal spray Place 2 sprays into both nostrils daily.  16 g  2  . lisinopril-hydrochlorothiazide (PRINZIDE,ZESTORETIC) 20-12.5 MG per tablet Take 2 tablets by mouth every morning.       . traMADol (ULTRAM) 50 MG tablet Take 100 mg by mouth every 6 (six) hours as needed (For pain.).       Marland Kitchen cephALEXin (KEFLEX) 500 MG capsule Take 1 capsule (500 mg total) by mouth 4 (four) times daily.  40 capsule  0   No current facility-administered medications on file prior to visit.   Family History  Problem Relation Age of Onset  . Hypertension Other    History   Social History  . Marital Status: Married    Spouse Name:  N/A    Number of Children: N/A  . Years of Education: N/A   Occupational History  . Not on file.   Social History Main Topics  . Smoking status: Never Smoker   . Smokeless tobacco: Not on file  . Alcohol Use: Yes     Comment: socially  . Drug Use: Not on file  . Sexual Activity: Not on file   Other Topics Concern  . Not on file   Social History Narrative  . No narrative on file    Review of Systems  Constitutional: Negative for fever, chills, diaphoresis, activity change, appetite change and fatigue.  HENT: Negative for ear pain, nosebleeds, congestion, facial swelling, rhinorrhea, neck pain, neck stiffness and ear discharge.   Eyes: Negative for pain, discharge, redness, itching and visual disturbance.  Respiratory: Negative for cough, choking, chest tightness, shortness of breath, wheezing and stridor.   Cardiovascular: Negative for chest pain, palpitations and leg swelling.  Gastrointestinal: Negative for abdominal distention.  Genitourinary: Negative for dysuria, urgency, frequency, hematuria, flank pain, decreased urine volume, difficulty urinating and dyspareunia.  Musculoskeletal: As in history of present illness Neurological: Negative for dizziness, tremors, seizures, syncope, facial asymmetry, speech difficulty, weakness, light-headedness, numbness and headaches.  Hematological: Negative for adenopathy. Does not bruise/bleed easily.  Psychiatric/Behavioral: Negative for hallucinations, behavioral problems, confusion, dysphoric mood, decreased concentration and  agitation.    Objective:   Filed Vitals:   12/03/13 1431  BP: 138/100  Pulse: 110  Temp: 98.3 F (36.8 C)  Resp: 16    Physical Exam  Constitutional: Appears well-developed and well-nourished. No distress.  HENT: Normocephalic. External right and left ear normal. Oropharynx is clear and moist.  Eyes: Conjunctivae and EOM are normal. PERRLA, no scleral icterus.  Neck: Normal ROM. Neck supple. No JVD.  No tracheal deviation. No thyromegaly.  CVS: RRR, S1/S2 +, no murmurs, no gallops, no carotid bruit.  Pulmonary: Effort and breath sounds normal, no stridor, rhonchi, wheezes, rales.  Abdominal: Soft. BS +,  no distension, tenderness, rebound or guarding.  Musculoskeletal: Right arm with cramping sensations along medial bicep/tricep without TTP or noted deformities; no bulges or signs of tendon rupture; no swelling or erythema; full ROM of right shoulder, elbow, and wrist without pain; strong radial pulse and cap refill; sensation intact Lymphadenopathy: No lymphadenopathy noted, cervical, inguinal. Neuro: Alert. Normal reflexes, muscle tone coordination. No cranial nerve deficit. Skin: Skin is warm and dry. No rash noted. Not diaphoretic. No erythema. No pallor.  Psychiatric: Normal mood and affect. Behavior, judgment, thought content normal.   Lab Results  Component Value Date   WBC 7.0 10/26/2013   HGB 12.2 10/26/2013   HCT 35.9* 10/26/2013   MCV 78.7 10/26/2013   PLT 311 10/26/2013   Lab Results  Component Value Date   CREATININE 0.85 10/26/2013   BUN 13 10/26/2013   NA 140 10/26/2013   K 4.3 10/26/2013   CL 104 10/26/2013   CO2 28 10/26/2013    No results found for this basename: HGBA1C   Lipid Panel     Component Value Date/Time   CHOL 202* 10/26/2013 0949   TRIG 178* 10/26/2013 0949   HDL 33* 10/26/2013 0949   CHOLHDL 6.1 10/26/2013 0949   VLDL 36 10/26/2013 0949   LDLCALC 133* 10/26/2013 0949       Assessment and plan:   Patient Active Problem List   Diagnosis Date Noted  . Sinusitis, chronic 08/23/2013  . HYPERTENSION 06/21/2008   Right arm pain Very localized pain Doubt systemic process such as polyneuropathy/myopathy Do a Doppler ultrasound to rule out DVT Plain x-rays to rule out occult fracture Prescribed meloxicam in addition to tramadol and Flexeril Check TSH, CK, CBC to rule out underlying myopathy/infection She can followup in 2-3 weeks if pain persists        The patient was given clear instructions to go to ER or return to medical center if symptoms don't improve, worsen or new problems develop. The patient verbalized understanding. The patient was told to call to get any lab results if not heard anything in the next week.

## 2013-12-07 ENCOUNTER — Ambulatory Visit: Payer: No Typology Code available for payment source | Admitting: Rehabilitation

## 2013-12-08 ENCOUNTER — Ambulatory Visit (HOSPITAL_COMMUNITY)
Admission: RE | Admit: 2013-12-08 | Discharge: 2013-12-08 | Disposition: A | Discharge status: Home / Self Care | Payer: No Typology Code available for payment source | Source: Ambulatory Visit | Attending: Internal Medicine | Admitting: Internal Medicine

## 2013-12-08 ENCOUNTER — Telehealth: Payer: Self-pay | Admitting: *Deleted

## 2013-12-08 DIAGNOSIS — M79609 Pain in unspecified limb: Secondary | ICD-10-CM

## 2013-12-08 DIAGNOSIS — M25529 Pain in unspecified elbow: Secondary | ICD-10-CM | POA: Insufficient documentation

## 2013-12-08 DIAGNOSIS — M259 Joint disorder, unspecified: Secondary | ICD-10-CM | POA: Insufficient documentation

## 2013-12-08 NOTE — Telephone Encounter (Signed)
Returned a telephone call to patient and notified patient of the following results. Call completed as followed.     Status: Signed        Message copied by Mycah Formica, [place] R on Wed Dec 08, 2013 2:42 PM  ------  Message from: Allyson Sabal MD, [facility]  Created: Wed Dec 08, 2013 1:03 PM  Patient the patient's x-rays of the right arm are negative

## 2013-12-08 NOTE — Telephone Encounter (Signed)
Message copied by Kienan Doublin, [place] R on Wed Dec 08, 2013  2:42 PM ------      Message from: Allyson Sabal MD, [facility]      Created: Wed Dec 08, 2013  1:03 PM       Patient the patient's x-rays of the right arm are negative ------

## 2013-12-08 NOTE — Telephone Encounter (Signed)
Left a voicemail for patient to give us a call back.

## 2013-12-08 NOTE — Progress Notes (Signed)
Right upper extremity venous duplex:  No evidence of DVT or superficial thrombosis.    

## 2013-12-08 NOTE — Telephone Encounter (Signed)
Patient returned a telephone call. Left a voicemail to be called back.

## 2013-12-09 ENCOUNTER — Encounter: Payer: No Typology Code available for payment source | Admitting: Rehabilitation

## 2013-12-10 ENCOUNTER — Encounter: Payer: No Typology Code available for payment source | Admitting: Physical Therapy

## 2013-12-13 ENCOUNTER — Telehealth: Payer: Self-pay | Admitting: *Deleted

## 2013-12-13 NOTE — Telephone Encounter (Signed)
Message copied by Elin Fenley, [place] R on Mon Dec 13, 2013  9:20 AM ------      Message from: Allyson Sabal MD, [facility]      Created: Wed Dec 08, 2013  1:55 PM       Notify patient labs are normal ------

## 2013-12-14 ENCOUNTER — Telehealth: Payer: Self-pay | Admitting: *Deleted

## 2013-12-14 NOTE — Telephone Encounter (Signed)
Message copied by Lauramae Kneisley, [place] R on Tue Dec 14, 2013  9:34 AM ------      Message from: Allyson Sabal MD, [facility]      Created: Mon Dec 13, 2013  1:47 PM       Notify patient ultrasound of the right arm did not show any blood clot ------

## 2013-12-14 NOTE — Telephone Encounter (Signed)
Left a message with relative for patient to return our call.

## 2013-12-24 ENCOUNTER — Encounter: Payer: Self-pay | Admitting: Internal Medicine

## 2013-12-24 ENCOUNTER — Ambulatory Visit: Payer: No Typology Code available for payment source | Attending: Internal Medicine | Admitting: Internal Medicine

## 2013-12-24 VITALS — BP 137/94 | HR 88 | Temp 97.9°F | Resp 16 | Ht 62.0 in | Wt 262.0 lb

## 2013-12-24 DIAGNOSIS — M545 Low back pain, unspecified: Secondary | ICD-10-CM | POA: Insufficient documentation

## 2013-12-24 DIAGNOSIS — I10 Essential (primary) hypertension: Secondary | ICD-10-CM | POA: Insufficient documentation

## 2013-12-24 DIAGNOSIS — G8929 Other chronic pain: Secondary | ICD-10-CM | POA: Insufficient documentation

## 2013-12-24 DIAGNOSIS — E669 Obesity, unspecified: Secondary | ICD-10-CM | POA: Insufficient documentation

## 2013-12-24 DIAGNOSIS — J3489 Other specified disorders of nose and nasal sinuses: Secondary | ICD-10-CM | POA: Insufficient documentation

## 2013-12-24 DIAGNOSIS — M1990 Unspecified osteoarthritis, unspecified site: Secondary | ICD-10-CM | POA: Insufficient documentation

## 2013-12-24 DIAGNOSIS — G4730 Sleep apnea, unspecified: Secondary | ICD-10-CM | POA: Insufficient documentation

## 2013-12-24 DIAGNOSIS — R0981 Nasal congestion: Secondary | ICD-10-CM

## 2013-12-24 DIAGNOSIS — M79609 Pain in unspecified limb: Secondary | ICD-10-CM | POA: Insufficient documentation

## 2013-12-24 DIAGNOSIS — Z09 Encounter for follow-up examination after completed treatment for conditions other than malignant neoplasm: Secondary | ICD-10-CM | POA: Insufficient documentation

## 2013-12-24 MED ORDER — MOMETASONE FUROATE 50 MCG/ACT NA SUSP: 2.0000 | Freq: Every day | NASAL | Status: DC

## 2013-12-24 MED ORDER — MELOXICAM 15 MG PO TABS: 15.0000 mg | ORAL_TABLET | Freq: Every day | ORAL | Status: DC

## 2013-12-24 NOTE — Progress Notes (Signed)
MRN: [id number] Name: Michele Wood  Sex: female Age: 53 y.o. DOB: 1960-12-07  Allergies: Codeine sulfate and Milk-related compounds  Chief Complaint  Patient presents with  . Follow-up    HPI: Patient is 53 y.o. female who has to of hypertension, comes chronic sinus issues, chronic right arm pain, had x-rays done reported to have DJD of right shoulder, also has chronic lower back pain, patient is requesting pain medication, patient also has been using Flonase for nasal congestion as per patient it is not helping much. Patient denies any fever chill chest pain or shortness of breath.  Past Medical History  Diagnosis Date  . Hypertension   . Obesity   . Sleep apnea   . Chronic back pain     Past Surgical History  Procedure Laterality Date  . Abdominal hysterectomy        Medication List       This list is accurate as of: 12/24/13  9:30 AM.  Always use your most recent med list.               amLODipine 10 MG tablet  Commonly known as:  NORVASC  Take 10 mg by mouth every morning.     cephALEXin 500 MG capsule  Commonly known as:  KEFLEX  Take 1 capsule (500 mg total) by mouth 4 (four) times daily.     cyclobenzaprine 10 MG tablet  Commonly known as:  FLEXERIL  Take 1 tablet (10 mg total) by mouth 2 (two) times daily as needed for muscle spasms.     lisinopril-hydrochlorothiazide 20-12.5 MG per tablet  Commonly known as:  PRINZIDE,ZESTORETIC  Take 2 tablets by mouth every morning.     meloxicam 15 MG tablet  Commonly known as:  MOBIC  Take 1 tablet (15 mg total) by mouth daily.     mometasone 50 MCG/ACT nasal spray  Commonly known as:  NASONEX  Place 2 sprays into the nose daily.     traMADol 50 MG tablet  Commonly known as:  ULTRAM  Take 100 mg by mouth every 6 (six) hours as needed (For pain.).        Meds ordered this encounter  Medications  . mometasone (NASONEX) 50 MCG/ACT nasal spray    Sig: Place 2 sprays into the nose daily.     Dispense:  17 g    Refill:  2  . meloxicam (MOBIC) 15 MG tablet    Sig: Take 1 tablet (15 mg total) by mouth daily.    Dispense:  30 tablet    Refill:  0    Immunization History  Administered Date(s) Administered  . Influenza Whole 09/13/2008    Family History  Problem Relation Age of Onset  . Hypertension Other     History  Substance Use Topics  . Smoking status: Never Smoker   . Smokeless tobacco: Not on file  . Alcohol Use: Yes     Comment: socially    Review of Systems   As noted in HPI  Filed Vitals:   12/24/13 0909  BP: 137/94  Pulse: 88  Temp: 97.9 F (36.6 C)  Resp: 16    Physical Exam  Physical Exam  Constitutional: No distress.  Eyes: EOM are normal. Pupils are equal, round, and reactive to light.  Cardiovascular: Normal rate and regular rhythm.   Pulmonary/Chest: Breath sounds normal. No respiratory distress. She has no wheezes. She has no rales.  Musculoskeletal:  Lower lumbar spinal tenderness, minimal tenderness  right shoulder anteriorly, good ROM     CBC    Component Value Date/Time   WBC 7.2 12/03/2013 1508   RBC 4.68 12/03/2013 1508   HGB 12.4 12/03/2013 1508   HCT 36.4 12/03/2013 1508   PLT 371 12/03/2013 1508   MCV 77.8* 12/03/2013 1508   LYMPHSABS 2.8 12/03/2013 1508   MONOABS 0.5 12/03/2013 1508   EOSABS 0.1 12/03/2013 1508   BASOSABS 0.0 12/03/2013 1508    CMP     Component Value Date/Time   NA 140 10/26/2013 0949   K 4.3 10/26/2013 0949   CL 104 10/26/2013 0949   CO2 28 10/26/2013 0949   GLUCOSE 90 10/26/2013 0949   BUN 13 10/26/2013 0949   CREATININE 0.85 10/26/2013 0949   CREATININE 0.62 01/30/2009 2310   CALCIUM 9.8 10/26/2013 0949   PROT 7.2 10/26/2013 0949   ALBUMIN 3.9 10/26/2013 0949   AST 14 10/26/2013 0949   ALT 14 10/26/2013 0949   ALKPHOS 80 10/26/2013 0949   BILITOT 0.3 10/26/2013 0949    Lab Results  Component Value Date/Time   CHOL 202* 10/26/2013  9:49 AM    No components found with this basename: hga1c     Lab Results  Component Value Date/Time   AST 14 10/26/2013  9:49 AM    Assessment and Plan  Low back pain - Plan: Ambulatory referral to Orthopedic Surgery, meloxicam (MOBIC) 15 MG tablet  Essential hypertension, benign Blood pressure is well controlled continue with current medications, low salt diet  DJD (degenerative joint disease) - Plan: Ambulatory referral to Orthopedic Surgery, meloxicam (MOBIC) 15 MG tablet  Nasal congestion - Plan: Will discontinue Flonase started on mometasone (NASONEX) 50 MCG/ACT nasal spray Is symptomatically not getting better consider referral to ENT.   Return in about 4 months (around 04/25/2014) for hypertension.  Lorayne Marek, MD

## 2013-12-24 NOTE — Progress Notes (Signed)
Pt is here following up on her chronic lower back and neck pain. Pt is requesting some other meditation for her sinusitis.

## 2013-12-29 ENCOUNTER — Ambulatory Visit: Payer: No Typology Code available for payment source | Attending: Internal Medicine | Admitting: Physical Therapy

## 2013-12-29 DIAGNOSIS — IMO0001 Reserved for inherently not codable concepts without codable children: Secondary | ICD-10-CM | POA: Insufficient documentation

## 2013-12-29 DIAGNOSIS — M2550 Pain in unspecified joint: Secondary | ICD-10-CM | POA: Insufficient documentation

## 2013-12-29 DIAGNOSIS — R5381 Other malaise: Secondary | ICD-10-CM | POA: Insufficient documentation

## 2013-12-29 DIAGNOSIS — M545 Low back pain, unspecified: Secondary | ICD-10-CM | POA: Insufficient documentation

## 2013-12-29 DIAGNOSIS — M25559 Pain in unspecified hip: Secondary | ICD-10-CM | POA: Insufficient documentation

## 2013-12-29 DIAGNOSIS — R293 Abnormal posture: Secondary | ICD-10-CM | POA: Insufficient documentation

## 2014-02-14 ENCOUNTER — Ambulatory Visit: Payer: No Typology Code available for payment source | Admitting: Dietician

## 2014-02-23 ENCOUNTER — Other Ambulatory Visit: Payer: Self-pay | Admitting: Internal Medicine

## 2014-02-26 ENCOUNTER — Encounter (HOSPITAL_COMMUNITY): Payer: Self-pay | Admitting: Emergency Medicine

## 2014-02-26 ENCOUNTER — Emergency Department (HOSPITAL_COMMUNITY)
Admission: EM | Admit: 2014-02-26 | Discharge: 2014-02-26 | Disposition: A | Discharge status: Home / Self Care | Payer: No Typology Code available for payment source | Attending: Emergency Medicine | Admitting: Emergency Medicine

## 2014-02-26 ENCOUNTER — Emergency Department (HOSPITAL_COMMUNITY): Payer: No Typology Code available for payment source

## 2014-02-26 DIAGNOSIS — G8929 Other chronic pain: Secondary | ICD-10-CM | POA: Insufficient documentation

## 2014-02-26 DIAGNOSIS — R51 Headache: Secondary | ICD-10-CM | POA: Insufficient documentation

## 2014-02-26 DIAGNOSIS — I10 Essential (primary) hypertension: Secondary | ICD-10-CM | POA: Insufficient documentation

## 2014-02-26 DIAGNOSIS — E669 Obesity, unspecified: Secondary | ICD-10-CM | POA: Insufficient documentation

## 2014-02-26 DIAGNOSIS — R0602 Shortness of breath: Secondary | ICD-10-CM | POA: Insufficient documentation

## 2014-02-26 DIAGNOSIS — R05 Cough: Secondary | ICD-10-CM

## 2014-02-26 DIAGNOSIS — R059 Cough, unspecified: Secondary | ICD-10-CM | POA: Insufficient documentation

## 2014-02-26 MED ORDER — BENZONATATE 100 MG PO CAPS: 100.0000 mg | ORAL_CAPSULE | Freq: Three times a day (TID) | ORAL | Status: DC

## 2014-02-26 MED ORDER — ALBUTEROL SULFATE HFA 108 (90 BASE) MCG/ACT IN AERS
1.0000 | INHALATION_SPRAY | RESPIRATORY_TRACT | Status: DC | PRN
  Administered 2014-02-26: 2 via RESPIRATORY_TRACT
  Filled 2014-02-26: qty 6.7

## 2014-02-26 NOTE — ED Notes (Signed)
Pt transported to XRAY °

## 2014-02-26 NOTE — ED Notes (Signed)
Pt arrived to the ED with a complaint of a cough that is causing pain in her head and her left side.   Pt has a horse voice.  Pt has had shortness of breath.  Pt states the symptoms have been present for two days.

## 2014-02-26 NOTE — Discharge Instructions (Signed)
Cough, Adult  A cough is a reflex. It helps you clear your throat and airways. A cough can help heal your body. A cough can last 2 or 3 weeks (acute) or may last more than 8 weeks (chronic). Some common causes of a cough can include an infection, allergy, or a cold. HOME CARE  Only take medicine as told by your doctor.  If given, take your medicines (antibiotics) as told. Finish them even if you start to feel better.  Use a cold steam vaporizer or humidier in your home. This can help loosen thick spit (secretions).  Sleep so you are almost sitting up (semi-upright). Use pillows to do this. This helps reduce coughing.  Rest as needed.  Stop smoking if you smoke. GET HELP RIGHT AWAY IF:  You have yellowish-white fluid (pus) in your thick spit.  Your cough gets worse.  Your medicine does not reduce coughing, and you are losing sleep.  You cough up blood.  You have trouble breathing.  Your pain gets worse and medicine does not help.  You have a fever. MAKE SURE YOU:   Understand these instructions.  Will watch your condition.  Will get help right away if you are not doing well or get worse. Document Released: 06/13/2011 Document Revised: 12/23/2011 Document Reviewed: 06/13/2011 Exit[employer] Patient Information 2014 [employer].  

## 2014-02-26 NOTE — ED Provider Notes (Signed)
CSN: [id number]     Arrival date & time 02/26/14  0865 History   First MD Initiated Contact with Patient 02/26/14 0703     Chief Complaint  Patient presents with  . Cough  . Headache    Patient is a 53 y.o. female presenting with cough and headaches. The history is provided by the patient.  Cough Cough characteristics:  Dry Severity:  Moderate Duration:  2 days Timing:  Constant Chronicity:  New Context: not sick contacts   Relieved by:  Nothing Worsened by:  Nothing tried Associated symptoms: headaches and shortness of breath   Associated symptoms: no fever, no sinus congestion and no sore throat   Headache Associated symptoms: cough   Associated symptoms: no fever and no sore throat     Past Medical History  Diagnosis Date  . Hypertension   . Obesity   . Sleep apnea   . Chronic back pain    Past Surgical History  Procedure Laterality Date  . Abdominal hysterectomy     Family History  Problem Relation Age of Onset  . Hypertension Other    History  Substance Use Topics  . Smoking status: Never Smoker   . Smokeless tobacco: Not on file  . Alcohol Use: Yes     Comment: socially   OB History   Grav Para Term Preterm Abortions TAB SAB Ect Mult Living                 Review of Systems  Constitutional: Negative for fever.  HENT: Negative for sore throat.   Respiratory: Positive for cough and shortness of breath.   Neurological: Positive for headaches.      Allergies  Codeine sulfate and Milk-related compounds  Home Medications   Prior to Admission medications   Medication Sig Start Date End Date Taking? Authorizing Provider  amLODipine (NORVASC) 10 MG tablet Take 10 mg by mouth every morning.   Yes Historical Provider, MD  cyclobenzaprine (FLEXERIL) 10 MG tablet Take 1 tablet (10 mg total) by mouth 2 (two) times daily as needed for muscle spasms. 08/23/13  Yes Robbie Lis, MD  lisinopril-hydrochlorothiazide (PRINZIDE,ZESTORETIC) 20-12.5 MG per tablet  Take 2 tablets by mouth every morning.    Yes Historical Provider, MD  traMADol (ULTRAM) 50 MG tablet Take 100 mg by mouth every 6 (six) hours as needed (For pain.).     Historical Provider, MD   BP 151/90  Pulse 90  Temp(Src) 98.3 F (36.8 C) (Oral)  Resp 18  Ht 5\' 2"  (1.575 m)  Wt 260 lb (117.935 kg)  BMI 47.54 kg/m2  SpO2 98%  LMP 09/01/2013 Physical Exam  Nursing note and vitals reviewed. Constitutional: She appears well-developed and well-nourished. No distress.  HENT:  Head: Normocephalic and atraumatic.  Right Ear: External ear normal.  Left Ear: External ear normal.  Mouth/Throat: No uvula swelling. No oropharyngeal exudate, posterior oropharyngeal edema or posterior oropharyngeal erythema.  Slight hoarseness  Eyes: Conjunctivae are normal. Right eye exhibits no discharge. Left eye exhibits no discharge. No scleral icterus.  Neck: Neck supple. No tracheal deviation present.  Cardiovascular: Normal rate, regular rhythm and intact distal pulses.   Pulmonary/Chest: Effort normal and breath sounds normal. No stridor. No respiratory distress. She has no wheezes. She has no rales.  Abdominal: Soft. Bowel sounds are normal. She exhibits no distension. There is no tenderness. There is no rebound and no guarding.  Musculoskeletal: She exhibits no edema and no tenderness.  Neurological: She is alert. She  has normal strength. No cranial nerve deficit (no facial droop, extraocular movements intact, no slurred speech) or sensory deficit. She exhibits normal muscle tone. She displays no seizure activity. Coordination normal.  Skin: Skin is warm and dry. No rash noted.  Psychiatric: She has a normal mood and affect.    ED Course  Procedures (including critical care time)  Imaging Review Dg Chest 2 View  02/26/2014   CLINICAL DATA:  Chest pain and productive cough  EXAM: CHEST  2 VIEW  COMPARISON:  October 23, 2012  FINDINGS: Lungs are clear. Heart size and pulmonary vascularity are  normal. No adenopathy. No bone lesions.  IMPRESSION: No abnormality noted.   Electronically Signed   By: Lowella Grip M.D.   On: 02/26/2014 07:43    MDM   Final diagnoses:  Cough    No wheezing on exam.  Pt has had trouble with sinusitis in the past.  No fever.  No focal tenderness.  No purulent nasal drainage.  Possible uri trigger.  Will dc home with cough medication.  Albuterol for symptomatic relief of cough.    Kathalene Frames, MD 02/26/14 [phone]

## 2014-03-22 ENCOUNTER — Encounter (HOSPITAL_COMMUNITY): Payer: Self-pay | Admitting: Emergency Medicine

## 2014-03-22 ENCOUNTER — Emergency Department (HOSPITAL_COMMUNITY)
Admission: EM | Admit: 2014-03-22 | Discharge: 2014-03-22 | Disposition: A | Discharge status: Home / Self Care | Payer: No Typology Code available for payment source | Attending: Emergency Medicine | Admitting: Emergency Medicine

## 2014-03-22 ENCOUNTER — Emergency Department (HOSPITAL_COMMUNITY): Payer: No Typology Code available for payment source

## 2014-03-22 DIAGNOSIS — M79609 Pain in unspecified limb: Secondary | ICD-10-CM | POA: Insufficient documentation

## 2014-03-22 DIAGNOSIS — G8929 Other chronic pain: Secondary | ICD-10-CM | POA: Insufficient documentation

## 2014-03-22 DIAGNOSIS — E669 Obesity, unspecified: Secondary | ICD-10-CM | POA: Insufficient documentation

## 2014-03-22 DIAGNOSIS — Z79899 Other long term (current) drug therapy: Secondary | ICD-10-CM | POA: Insufficient documentation

## 2014-03-22 DIAGNOSIS — M79675 Pain in left toe(s): Secondary | ICD-10-CM

## 2014-03-22 DIAGNOSIS — I10 Essential (primary) hypertension: Secondary | ICD-10-CM | POA: Insufficient documentation

## 2014-03-22 DIAGNOSIS — M549 Dorsalgia, unspecified: Secondary | ICD-10-CM | POA: Insufficient documentation

## 2014-03-22 MED ORDER — NAPROXEN 500 MG PO TABS: 500.0000 mg | ORAL_TABLET | Freq: Two times a day (BID) | ORAL | Status: DC

## 2014-03-22 MED ORDER — IBUPROFEN 800 MG PO TABS
800.0000 mg | ORAL_TABLET | Freq: Once | ORAL | Status: AC
  Administered 2014-03-22: 800 mg via ORAL
  Filled 2014-03-22: qty 1

## 2014-03-22 MED ORDER — OXYCODONE-ACETAMINOPHEN 5-325 MG PO TABS
1.0000 | ORAL_TABLET | Freq: Once | ORAL | Status: DC
  Filled 2014-03-22: qty 1

## 2014-03-22 NOTE — Progress Notes (Signed)
[facility] [employer], spoke with patient about [employer]. Patient has an upcoming apt with pcp [facility] on 7/13 at 10:45 am. Provided contact information and apt information to patient.

## 2014-03-22 NOTE — ED Notes (Addendum)
Pt c/o L foot pain.  Pain score 9/10.  Pt reports that she was standing in there kitchen when pain began.  Denies injury.  Pt reports having this pain previously and told that it was a callus.

## 2014-03-23 NOTE — ED Provider Notes (Signed)
CSN: [id number]     Arrival date & time 03/22/14  0920 History   First MD Initiated Contact with Patient 03/22/14 [phone]     Chief Complaint  Patient presents with  . Foot Pain     The history is provided by the patient.   Patient reports developing left second toe pain over the past 24 hours.  No injury that she can or member.  No fevers chills.  She feels like there is a small amount of swelling around the toe itself.  She's noticed no new rash.  She denies a history of diabetes.  No prior history of gout.  She denies other foot or ankle pain.  No unilateral leg swelling.  Symptoms are mild in severity    Past Medical History  Diagnosis Date  . Hypertension   . Obesity   . Sleep apnea   . Chronic back pain    Past Surgical History  Procedure Laterality Date  . Abdominal hysterectomy     Family History  Problem Relation Age of Onset  . Hypertension Other    History  Substance Use Topics  . Smoking status: Never Smoker   . Smokeless tobacco: Not on file  . Alcohol Use: Yes     Comment: socially   OB History   Grav Para Term Preterm Abortions TAB SAB Ect Mult Living                 Review of Systems  All other systems reviewed and are negative.     Allergies  Codeine sulfate and Milk-related compounds  Home Medications   Prior to Admission medications   Medication Sig Start Date End Date Taking? Authorizing Provider  amLODipine (NORVASC) 10 MG tablet Take 10 mg by mouth every morning.    Historical Provider, MD  benzonatate (TESSALON) 100 MG capsule Take 1 capsule (100 mg total) by mouth every 8 (eight) hours. 02/26/14   Dorie Rank, MD  cyclobenzaprine (FLEXERIL) 10 MG tablet Take 1 tablet (10 mg total) by mouth 2 (two) times daily as needed for muscle spasms. 08/23/13   Robbie Lis, MD  lisinopril-hydrochlorothiazide (PRINZIDE,ZESTORETIC) 20-12.5 MG per tablet Take 2 tablets by mouth every morning.     Historical Provider, MD  naproxen (NAPROSYN) 500 MG tablet  Take 1 tablet (500 mg total) by mouth 2 (two) times daily. 03/22/14   Hoy Morn, MD  traMADol (ULTRAM) 50 MG tablet Take 100 mg by mouth every 6 (six) hours as needed (For pain.).     Historical Provider, MD   BP 119/80  Pulse 73  Temp(Src) 98 F (36.7 C) (Oral)  Resp 16  SpO2 98%  LMP 09/01/2013 Physical Exam  Nursing note and vitals reviewed. Constitutional: She is oriented to person, place, and time. She appears well-developed and well-nourished.  HENT:  Head: Normocephalic.  Eyes: EOM are normal.  Neck: Normal range of motion.  Pulmonary/Chest: Effort normal.  Abdominal: She exhibits no distension.  Musculoskeletal: Normal range of motion.  Normal PT and DP pulses left foot.  No significant swelling or erythema of the left foot.  Mild tenderness at the base of the second proximal phalanx of the toe.  No signs of infection distal to this.  Normal perfusion of the left second toe.  Neurological: She is alert and oriented to person, place, and time.  Psychiatric: She has a normal mood and affect.    ED Course  Procedures (including critical care time) Labs Review Labs Reviewed -  No data to display  Imaging Review Dg Foot Complete Left  03/22/2014   CLINICAL DATA:  Second digit pain for 2 days  EXAM: LEFT FOOT - COMPLETE 3+ VIEW  COMPARISON:  None.  FINDINGS: Three views of the left foot submitted. No acute fracture or subluxation. There is plantar spurring of calcaneus.  IMPRESSION: No acute fracture or subluxation.  Plantar spurring of calcaneus.   Electronically Signed   By: Lahoma Crocker M.D.   On: 03/22/2014 09:56     EKG Interpretation None      MDM   Final diagnoses:  Toe pain, left    No signs of infection.  Normal perfusion.  X-ray without fracture or gas.  I recommended PCP and podiatry followup.  She understands to return to the ER for new or worsening symptoms.  My suspicion for deep space infection is low    Hoy Morn, MD 03/23/14 [phone]

## 2014-03-24 ENCOUNTER — Other Ambulatory Visit: Payer: Self-pay | Admitting: Internal Medicine

## 2014-03-25 ENCOUNTER — Encounter: Payer: Self-pay | Admitting: Internal Medicine

## 2014-03-25 ENCOUNTER — Ambulatory Visit: Payer: No Typology Code available for payment source | Attending: Internal Medicine | Admitting: Internal Medicine

## 2014-03-25 VITALS — BP 121/85 | HR 98 | Temp 98.8°F | Resp 16 | Wt 265.2 lb

## 2014-03-25 DIAGNOSIS — Z791 Long term (current) use of non-steroidal anti-inflammatories (NSAID): Secondary | ICD-10-CM | POA: Insufficient documentation

## 2014-03-25 DIAGNOSIS — Z139 Encounter for screening, unspecified: Secondary | ICD-10-CM

## 2014-03-25 DIAGNOSIS — Z79899 Other long term (current) drug therapy: Secondary | ICD-10-CM | POA: Insufficient documentation

## 2014-03-25 DIAGNOSIS — E669 Obesity, unspecified: Secondary | ICD-10-CM | POA: Insufficient documentation

## 2014-03-25 DIAGNOSIS — M549 Dorsalgia, unspecified: Secondary | ICD-10-CM | POA: Insufficient documentation

## 2014-03-25 DIAGNOSIS — M79675 Pain in left toe(s): Secondary | ICD-10-CM

## 2014-03-25 DIAGNOSIS — M79609 Pain in unspecified limb: Secondary | ICD-10-CM

## 2014-03-25 DIAGNOSIS — I10 Essential (primary) hypertension: Secondary | ICD-10-CM

## 2014-03-25 NOTE — Progress Notes (Signed)
Patient here for follow up from the ED Was seen for back pain and left foot pain States she has a "knot" at the bottom of her foot

## 2014-03-25 NOTE — Progress Notes (Signed)
MRN: [id number] Name: Michele Wood  Sex: female Age: 53 y.o. DOB: 08/15/1961  Allergies: Codeine sulfate and Milk-related compounds  Chief Complaint  Patient presents with  . Follow-up    HPI: Patient is 53 y.o. female who has history of hypertension comes today for followup, recently she went to the emergency room with the complaint of left foot third toe pain, EMR reviewed patient had an x-ray done which was negative, she was prescribed naproxen as per patient she has not taken his medication yet denies any fever chills chest and shortness of breath. Her blood pressure is controlled.  Past Medical History  Diagnosis Date  . Hypertension   . Obesity   . Sleep apnea   . Chronic back pain     Past Surgical History  Procedure Laterality Date  . Abdominal hysterectomy        Medication List       This list is accurate as of: 03/25/14 12:27 PM.  Always use your most recent med list.               amLODipine 10 MG tablet  Commonly known as:  NORVASC  Take 10 mg by mouth every morning.     benzonatate 100 MG capsule  Commonly known as:  TESSALON  Take 1 capsule (100 mg total) by mouth every 8 (eight) hours.     cyclobenzaprine 10 MG tablet  Commonly known as:  FLEXERIL  TAKE 1 TABLET BY MOUTH TWICE DAILY AS NEEDED FOR MUSCLE SPASMS     lisinopril-hydrochlorothiazide 20-12.5 MG per tablet  Commonly known as:  PRINZIDE,ZESTORETIC  TAKE 2 TABLETS BY MOUTH DAILY     naproxen 500 MG tablet  Commonly known as:  NAPROSYN  Take 1 tablet (500 mg total) by mouth 2 (two) times daily.     traMADol 50 MG tablet  Commonly known as:  ULTRAM  Take 100 mg by mouth every 6 (six) hours as needed (For pain.).        No orders of the defined types were placed in this encounter.    Immunization History  Administered Date(s) Administered  . Influenza Whole 09/13/2008    Family History  Problem Relation Age of Onset  . Hypertension Other     History    Substance Use Topics  . Smoking status: Never Smoker   . Smokeless tobacco: Not on file  . Alcohol Use: Yes     Comment: socially    Review of Systems   As noted in HPI  Filed Vitals:   03/25/14 1211  BP: 121/85  Pulse: 98  Temp: 98.8 F (37.1 C)  Resp: 16    Physical Exam  Physical Exam  Constitutional: No distress.  Eyes: EOM are normal. Pupils are equal, round, and reactive to light.  Cardiovascular: Normal rate.   Pulmonary/Chest: Breath sounds normal. No respiratory distress. She has no wheezes. She has no rales.  Musculoskeletal:  Bilateral feet no swelling, left foot minimal tenderness at the base of second toe but no erythema or swelling    CBC    Component Value Date/Time   WBC 7.2 12/03/2013 1508   RBC 4.68 12/03/2013 1508   HGB 12.4 12/03/2013 1508   HCT 36.4 12/03/2013 1508   PLT 371 12/03/2013 1508   MCV 77.8* 12/03/2013 1508   LYMPHSABS 2.8 12/03/2013 1508   MONOABS 0.5 12/03/2013 1508   EOSABS 0.1 12/03/2013 1508   BASOSABS 0.0 12/03/2013 1508    CMP  Component Value Date/Time   NA 140 10/26/2013 0949   K 4.3 10/26/2013 0949   CL 104 10/26/2013 0949   CO2 28 10/26/2013 0949   GLUCOSE 90 10/26/2013 0949   BUN 13 10/26/2013 0949   CREATININE 0.85 10/26/2013 0949   CREATININE 0.62 01/30/2009 2310   CALCIUM 9.8 10/26/2013 0949   PROT 7.2 10/26/2013 0949   ALBUMIN 3.9 10/26/2013 0949   AST 14 10/26/2013 0949   ALT 14 10/26/2013 0949   ALKPHOS 80 10/26/2013 0949   BILITOT 0.3 10/26/2013 0949   GFRNONAA 80 10/26/2013 0949   GFRAA >89 10/26/2013 0949    Lab Results  Component Value Date/Time   CHOL 202* 10/26/2013  9:49 AM    No components found with this basename: hga1c    Lab Results  Component Value Date/Time   AST 14 10/26/2013  9:49 AM    Assessment and Plan  Toe pain, left X-rays negative, she is going to start taking naproxen. Patient is obese I have advised patient to exercise and lose weight, if the pain does not improve consider referral  to podiatry.  Screening - Plan: Vit D  25 hydroxy (rtn osteoporosis monitoring)  Essential hypertension, benign - Plan: Well controlled she is on lisinopril/had thiazide, will repeat her blood chemistry COMPLETE METABOLIC PANEL WITH GFR   Return in about 3 months (around 06/25/2014) for hypertension.  Lorayne Marek, MD

## 2014-03-26 LAB — COMPLETE METABOLIC PANEL WITH GFR
ALT: 13 U/L (ref 0–35)
AST: 13 U/L (ref 0–37)
Albumin: 3.9 g/dL (ref 3.5–5.2)
Alkaline Phosphatase: 78 U/L (ref 39–117)
BUN: 13 mg/dL (ref 6–23)
CALCIUM: 9.4 mg/dL (ref 8.4–10.5)
CHLORIDE: 99 meq/L (ref 96–112)
CO2: 29 mEq/L (ref 19–32)
CREATININE: 0.95 mg/dL (ref 0.50–1.10)
GFR, Est African American: 80 mL/min
GFR, Est Non African American: 70 mL/min
Glucose, Bld: 97 mg/dL (ref 70–99)
Potassium: 3.8 mEq/L (ref 3.5–5.3)
Sodium: 137 mEq/L (ref 135–145)
Total Bilirubin: 0.2 mg/dL (ref 0.2–1.2)
Total Protein: 7.1 g/dL (ref 6.0–8.3)

## 2014-03-26 LAB — VITAMIN D 25 HYDROXY (VIT D DEFICIENCY, FRACTURES): Vit D, 25-Hydroxy: 20 ng/mL — ABNORMAL LOW (ref 30–89)

## 2014-03-28 ENCOUNTER — Telehealth: Payer: Self-pay

## 2014-03-28 MED ORDER — VITAMIN D (ERGOCALCIFEROL) 1.25 MG (50000 UNIT) PO CAPS: 50000.0000 [IU] | ORAL_CAPSULE | ORAL | Status: DC

## 2014-03-28 NOTE — Telephone Encounter (Signed)
Message copied by Dorothe Pea on Mon Mar 28, 2014 12:43 PM ------      Message from: Lorayne Marek      Created: Mon Mar 28, 2014  9:14 AM       Blood work reviewed, noticed low vitamin D, call patient advise to start ergocalciferol 50,000 units once a week for the duration of  12 weeks.             ------

## 2014-03-28 NOTE — Telephone Encounter (Signed)
Patient not available Left message on voice mail to return our call 

## 2014-04-25 ENCOUNTER — Encounter: Payer: Self-pay | Admitting: Internal Medicine

## 2014-04-25 ENCOUNTER — Ambulatory Visit: Payer: No Typology Code available for payment source | Attending: Internal Medicine | Admitting: Internal Medicine

## 2014-04-25 VITALS — BP 130/88 | HR 79 | Temp 98.0°F | Resp 15 | Wt 264.2 lb

## 2014-04-25 DIAGNOSIS — I10 Essential (primary) hypertension: Secondary | ICD-10-CM | POA: Insufficient documentation

## 2014-04-25 DIAGNOSIS — M542 Cervicalgia: Secondary | ICD-10-CM | POA: Insufficient documentation

## 2014-04-25 DIAGNOSIS — Z139 Encounter for screening, unspecified: Secondary | ICD-10-CM

## 2014-04-25 DIAGNOSIS — Z0000 Encounter for general adult medical examination without abnormal findings: Secondary | ICD-10-CM | POA: Insufficient documentation

## 2014-04-25 DIAGNOSIS — E559 Vitamin D deficiency, unspecified: Secondary | ICD-10-CM | POA: Insufficient documentation

## 2014-04-25 DIAGNOSIS — Z1211 Encounter for screening for malignant neoplasm of colon: Secondary | ICD-10-CM

## 2014-04-25 MED ORDER — NAPROXEN 500 MG PO TABS: 500.0000 mg | ORAL_TABLET | Freq: Two times a day (BID) | ORAL | Status: DC

## 2014-04-25 NOTE — Progress Notes (Signed)
Patient here for follow up on her neck and back pain

## 2014-04-25 NOTE — Progress Notes (Signed)
MRN: [id number] Name: Michele Wood  Sex: female Age: 53 y.o. DOB: Aug 04, 1961  Allergies: Codeine sulfate and Milk-related compounds  Chief Complaint  Patient presents with  . Follow-up    HPI: Patient is 53 y.o. female who has to of hypertension chronic neck pain comes today for followup, she is compliant in taking her blood pressure medication and is taking Flexeril the muscle relaxant she ran out of the medication naproxen and is requesting refill denies any chest and shortness of breath also history of vitamin D deficiency, she is taking over-the-counter 2000 units daily.  Past Medical History  Diagnosis Date  . Hypertension   . Obesity   . Sleep apnea   . Chronic back pain     Past Surgical History  Procedure Laterality Date  . Abdominal hysterectomy        Medication List       This list is accurate as of: 04/25/14 11:32 AM.  Always use your most recent med list.               benzonatate 100 MG capsule  Commonly known as:  TESSALON  Take 1 capsule (100 mg total) by mouth every 8 (eight) hours.     cyclobenzaprine 10 MG tablet  Commonly known as:  FLEXERIL  TAKE 1 TABLET BY MOUTH TWICE DAILY AS NEEDED FOR MUSCLE SPASMS     lisinopril-hydrochlorothiazide 20-12.5 MG per tablet  Commonly known as:  PRINZIDE,ZESTORETIC  TAKE 2 TABLETS BY MOUTH DAILY     naproxen 500 MG tablet  Commonly known as:  NAPROSYN  Take 1 tablet (500 mg total) by mouth 2 (two) times daily with a meal.     traMADol 50 MG tablet  Commonly known as:  ULTRAM  Take 100 mg by mouth every 6 (six) hours as needed (For pain.).     Vitamin D (Ergocalciferol) 50000 UNITS Caps capsule  Commonly known as:  DRISDOL  Take 1 capsule (50,000 Units total) by mouth every 7 (seven) days.        Meds ordered this encounter  Medications  . naproxen (NAPROSYN) 500 MG tablet    Sig: Take 1 tablet (500 mg total) by mouth 2 (two) times daily with a meal.    Dispense:  60 tablet   Refill:  0    Immunization History  Administered Date(s) Administered  . Influenza Whole 09/13/2008    Family History  Problem Relation Age of Onset  . Hypertension Other     History  Substance Use Topics  . Smoking status: Never Smoker   . Smokeless tobacco: Not on file  . Alcohol Use: Yes     Comment: socially    Review of Systems   As noted in HPI  Filed Vitals:   04/25/14 1110  BP: 130/88  Pulse: 79  Temp: 98 F (36.7 C)  Resp: 15    Physical Exam  Physical Exam  Constitutional: No distress.  Eyes: EOM are normal. Pupils are equal, round, and reactive to light.  Cardiovascular: Normal rate and regular rhythm.   Pulmonary/Chest: Breath sounds normal. No respiratory distress. She has no wheezes. She has no rales.  Musculoskeletal: She exhibits no edema.    CBC    Component Value Date/Time   WBC 7.2 12/03/2013 1508   RBC 4.68 12/03/2013 1508   HGB 12.4 12/03/2013 1508   HCT 36.4 12/03/2013 1508   PLT 371 12/03/2013 1508   MCV 77.8* 12/03/2013 1508   LYMPHSABS 2.8  12/03/2013 1508   MONOABS 0.5 12/03/2013 1508   EOSABS 0.1 12/03/2013 1508   BASOSABS 0.0 12/03/2013 1508    CMP     Component Value Date/Time   NA 137 03/25/2014 1231   K 3.8 03/25/2014 1231   CL 99 03/25/2014 1231   CO2 29 03/25/2014 1231   GLUCOSE 97 03/25/2014 1231   BUN 13 03/25/2014 1231   CREATININE 0.95 03/25/2014 1231   CREATININE 0.62 01/30/2009 2310   CALCIUM 9.4 03/25/2014 1231   PROT 7.1 03/25/2014 1231   ALBUMIN 3.9 03/25/2014 1231   AST 13 03/25/2014 1231   ALT 13 03/25/2014 1231   ALKPHOS 78 03/25/2014 1231   BILITOT 0.2 03/25/2014 1231   GFRNONAA 70 03/25/2014 1231   GFRAA 80 03/25/2014 1231    Lab Results  Component Value Date/Time   CHOL 202* 10/26/2013  9:49 AM    No components found with this basename: hga1c    Lab Results  Component Value Date/Time   AST 13 03/25/2014 12:31 PM    Assessment and Plan  Essential hypertension, benign Blood pressure is well controlled  continue with lisinopril/hydrochlorothiazide. Also advise for DASH diet.  Unspecified vitamin D deficiency Continue with over-the-counter 2000 units daily.  Neck pain - Plan: naproxen (NAPROSYN) 500 MG tablet, also advise patient to apply heating pad, she is taking Flexeril.  Screening - Plan: MM DIGITAL SCREENING BILATERAL  Special screening for malignant neoplasms, colon - Plan: Ambulatory referral to Gastroenterology   Health Maintenance -Colonoscopy: referred to GI  -Mammogram: ordered    Return in about 3 months (around 07/26/2014) for hypertension.  Lorayne Marek, MD

## 2014-04-27 ENCOUNTER — Encounter: Payer: Self-pay | Admitting: Internal Medicine

## 2014-06-02 ENCOUNTER — Ambulatory Visit: Payer: No Typology Code available for payment source | Attending: Internal Medicine | Admitting: Internal Medicine

## 2014-06-02 ENCOUNTER — Encounter: Payer: Self-pay | Admitting: Internal Medicine

## 2014-06-02 VITALS — BP 119/82 | HR 103 | Temp 98.0°F | Resp 16 | Wt 265.4 lb

## 2014-06-02 DIAGNOSIS — E559 Vitamin D deficiency, unspecified: Secondary | ICD-10-CM

## 2014-06-02 DIAGNOSIS — M542 Cervicalgia: Secondary | ICD-10-CM

## 2014-06-02 DIAGNOSIS — R209 Unspecified disturbances of skin sensation: Secondary | ICD-10-CM

## 2014-06-02 DIAGNOSIS — Z6841 Body Mass Index (BMI) 40.0 and over, adult: Secondary | ICD-10-CM | POA: Insufficient documentation

## 2014-06-02 DIAGNOSIS — R202 Paresthesia of skin: Secondary | ICD-10-CM

## 2014-06-02 DIAGNOSIS — E669 Obesity, unspecified: Secondary | ICD-10-CM | POA: Insufficient documentation

## 2014-06-02 DIAGNOSIS — I10 Essential (primary) hypertension: Secondary | ICD-10-CM | POA: Insufficient documentation

## 2014-06-02 DIAGNOSIS — X58XXXA Exposure to other specified factors, initial encounter: Secondary | ICD-10-CM | POA: Insufficient documentation

## 2014-06-02 DIAGNOSIS — S0300XA Dislocation of jaw, unspecified side, initial encounter: Secondary | ICD-10-CM

## 2014-06-02 MED ORDER — NAPROXEN 500 MG PO TABS: 500.0000 mg | ORAL_TABLET | Freq: Two times a day (BID) | ORAL | Status: DC

## 2014-06-02 NOTE — Progress Notes (Signed)
MRN: [id number] Name: Michele Wood  Sex: female Age: 53 y.o. DOB: [birth year]  Allergies: Codeine sulfate and Milk-related compounds  Chief Complaint  Patient presents with  . Neck Pain    HPI: Patient is 53 y.o. female who has history of cervicalgia chronic neck pain, she had MRI done in 2014 reported to have spondylosis DJD mild spinal stenosis at C3-C4, recently patient has reported some numbness tingling in right hand, patient is also complaining of pain in the jaw right side sometimes it hurts when she chew, she has history of chronic sinusitis denies any ear pain or discharge. Patient denies any fever chills chest shortness of breath.  Past Medical History  Diagnosis Date  . Hypertension   . Obesity   . Sleep apnea   . Chronic back pain     Past Surgical History  Procedure Laterality Date  . Abdominal hysterectomy        Medication List       This list is accurate as of: 06/02/14  3:01 PM.  Always use your most recent med list.               benzonatate 100 MG capsule  Commonly known as:  TESSALON  Take 1 capsule (100 mg total) by mouth every 8 (eight) hours.     cyclobenzaprine 10 MG tablet  Commonly known as:  FLEXERIL  TAKE 1 TABLET BY MOUTH TWICE DAILY AS NEEDED FOR MUSCLE SPASMS     lisinopril-hydrochlorothiazide 20-12.5 MG per tablet  Commonly known as:  PRINZIDE,ZESTORETIC  TAKE 2 TABLETS BY MOUTH DAILY     naproxen 500 MG tablet  Commonly known as:  NAPROSYN  Take 1 tablet (500 mg total) by mouth 2 (two) times daily with a meal.     traMADol 50 MG tablet  Commonly known as:  ULTRAM  Take 100 mg by mouth every 6 (six) hours as needed (For pain.).     Vitamin D (Ergocalciferol) 50000 UNITS Caps capsule  Commonly known as:  DRISDOL  Take 1 capsule (50,000 Units total) by mouth every 7 (seven) days.        Meds ordered this encounter  Medications  . naproxen (NAPROSYN) 500 MG tablet    Sig: Take 1 tablet (500 mg total) by  mouth 2 (two) times daily with a meal.    Dispense:  60 tablet    Refill:  3    Immunization History  Administered Date(s) Administered  . Influenza Whole 09/13/2008    Family History  Problem Relation Age of Onset  . Hypertension Other     History  Substance Use Topics  . Smoking status: Never Smoker   . Smokeless tobacco: Not on file  . Alcohol Use: Yes     Comment: socially    Review of Systems   As noted in HPI  Filed Vitals:   06/02/14 1417  BP: 119/82  Pulse: 103  Temp: 98 F (36.7 C)  Resp: 16    Physical Exam  Physical Exam  Constitutional: No distress.  Eyes: EOM are normal. Pupils are equal, round, and reactive to light.  Cardiovascular: Normal rate and regular rhythm.   Pulmonary/Chest: Breath sounds normal. No respiratory distress. She has no rales.  Musculoskeletal: She exhibits no edema.    CBC    Component Value Date/Time   WBC 7.2 12/03/2013 1508   RBC 4.68 12/03/2013 1508   HGB 12.4 12/03/2013 1508   HCT 36.4 12/03/2013 1508   PLT  371 12/03/2013 1508   MCV 77.8* 12/03/2013 1508   LYMPHSABS 2.8 12/03/2013 1508   MONOABS 0.5 12/03/2013 1508   EOSABS 0.1 12/03/2013 1508   BASOSABS 0.0 12/03/2013 1508    CMP     Component Value Date/Time   NA 137 03/25/2014 1231   K 3.8 03/25/2014 1231   CL 99 03/25/2014 1231   CO2 29 03/25/2014 1231   GLUCOSE 97 03/25/2014 1231   BUN 13 03/25/2014 1231   CREATININE 0.95 03/25/2014 1231   CREATININE 0.62 01/30/2009 2310   CALCIUM 9.4 03/25/2014 1231   PROT 7.1 03/25/2014 1231   ALBUMIN 3.9 03/25/2014 1231   AST 13 03/25/2014 1231   ALT 13 03/25/2014 1231   ALKPHOS 78 03/25/2014 1231   BILITOT 0.2 03/25/2014 1231   GFRNONAA 70 03/25/2014 1231   GFRAA 80 03/25/2014 1231    Lab Results  Component Value Date/Time   CHOL 202* 10/26/2013  9:49 AM    No components found with this basename: hga1c    Lab Results  Component Value Date/Time   AST 13 03/25/2014 12:31 PM    Assessment and Plan  Cervicalgia /Right  hand paresthesia,- Plan: naproxen (NAPROSYN) 500 MG tablet, continued Flexeril,  also referred to neurology for further evaluation  Essential hypertension, benign Pressure is well controlled continue with current meds.  TMJ (dislocation of temporomandibular joint), initial encounter - Plan: naproxen (NAPROSYN) 500 MG tablet  Neck pain - Plan: naproxen (NAPROSYN) 500 MG tablet   Return in about 3 months (around 09/02/2014) for hypertension.  Lorayne Marek, MD

## 2014-06-02 NOTE — Progress Notes (Signed)
Patient complains of neck pain, lower back pain and  Bilateral jaw pain The jaw pain is a new onset

## 2014-06-16 ENCOUNTER — Emergency Department (HOSPITAL_COMMUNITY): Payer: No Typology Code available for payment source

## 2014-06-16 ENCOUNTER — Encounter (HOSPITAL_COMMUNITY): Payer: Self-pay | Admitting: Emergency Medicine

## 2014-06-16 ENCOUNTER — Emergency Department (HOSPITAL_COMMUNITY)
Admission: EM | Admit: 2014-06-16 | Discharge: 2014-06-16 | Disposition: A | Discharge status: Home / Self Care | Payer: No Typology Code available for payment source | Attending: Emergency Medicine | Admitting: Emergency Medicine

## 2014-06-16 DIAGNOSIS — G8929 Other chronic pain: Secondary | ICD-10-CM | POA: Insufficient documentation

## 2014-06-16 DIAGNOSIS — M7732 Calcaneal spur, left foot: Secondary | ICD-10-CM

## 2014-06-16 DIAGNOSIS — I10 Essential (primary) hypertension: Secondary | ICD-10-CM | POA: Insufficient documentation

## 2014-06-16 DIAGNOSIS — E669 Obesity, unspecified: Secondary | ICD-10-CM | POA: Insufficient documentation

## 2014-06-16 DIAGNOSIS — M79609 Pain in unspecified limb: Secondary | ICD-10-CM | POA: Insufficient documentation

## 2014-06-16 DIAGNOSIS — Z79899 Other long term (current) drug therapy: Secondary | ICD-10-CM | POA: Insufficient documentation

## 2014-06-16 DIAGNOSIS — M7730 Calcaneal spur, unspecified foot: Secondary | ICD-10-CM | POA: Insufficient documentation

## 2014-06-16 DIAGNOSIS — Z791 Long term (current) use of non-steroidal anti-inflammatories (NSAID): Secondary | ICD-10-CM | POA: Insufficient documentation

## 2014-06-16 DIAGNOSIS — M722 Plantar fascial fibromatosis: Secondary | ICD-10-CM

## 2014-06-16 MED ORDER — IBUPROFEN 800 MG PO TABS
800.0000 mg | ORAL_TABLET | Freq: Once | ORAL | Status: AC
  Administered 2014-06-16: 800 mg via ORAL
  Filled 2014-06-16: qty 1

## 2014-06-16 MED ORDER — NAPROXEN 500 MG PO TABS: 500.0000 mg | ORAL_TABLET | Freq: Two times a day (BID) | ORAL | Status: DC

## 2014-06-16 MED ORDER — TRAMADOL HCL 50 MG PO TABS: 50.0000 mg | ORAL_TABLET | Freq: Four times a day (QID) | ORAL | Status: DC | PRN

## 2014-06-16 NOTE — ED Notes (Signed)
Initial Contact - pt A+Ox4, resting on stretcher with family at bedside, pt reports pain improved at this time.  4/10 currently.  +csm/+pulses.  Skin PWD.  MAEI.  Self repositioning for comfort.  Speaking full/clear sentences.  NAD.

## 2014-06-16 NOTE — Discharge Instructions (Signed)
See information below. Naprosyn and ultram for pain. Ice massage. Follow up with your doctor.    Plantar Fasciitis (Heel Spur Syndrome) with Rehab The plantar fascia is a fibrous, ligament-like, soft-tissue structure that spans the bottom of the foot. Plantar fasciitis is a condition that causes pain in the foot due to inflammation of the tissue. SYMPTOMS   Pain and tenderness on the underneath side of the foot.  Pain that worsens with standing or walking. CAUSES  Plantar fasciitis is caused by irritation and injury to the plantar fascia on the underneath side of the foot. Common mechanisms of injury include:  Direct trauma to bottom of the foot.  Damage to a small nerve that runs under the foot where the main fascia attaches to the heel bone.  Stress placed on the plantar fascia due to bone spurs. RISK INCREASES WITH:   Activities that place stress on the plantar fascia (running, jumping, pivoting, or cutting).  Poor strength and flexibility.  Improperly fitted shoes.  Tight calf muscles.  Flat feet.  Failure to warm-up properly before activity.  Obesity. PREVENTION  Warm up and stretch properly before activity.  Allow for adequate recovery between workouts.  Maintain physical fitness:  Strength, flexibility, and endurance.  Cardiovascular fitness.  Maintain a health body weight.  Avoid stress on the plantar fascia.  Wear properly fitted shoes, including arch supports for individuals who have flat feet. PROGNOSIS  If treated properly, then the symptoms of plantar fasciitis usually resolve without surgery. However, occasionally surgery is necessary. RELATED COMPLICATIONS   Recurrent symptoms that may result in a chronic condition.  Problems of the lower back that are caused by compensating for the injury, such as limping.  Pain or weakness of the foot during push-off following surgery.  Chronic inflammation, scarring, and partial or complete fascia tear,  occurring more often from repeated injections. TREATMENT  Treatment initially involves the use of ice and medication to help reduce pain and inflammation. The use of strengthening and stretching exercises may help reduce pain with activity, especially stretches of the Achilles tendon. These exercises may be performed at home or with a therapist. Your caregiver may recommend that you use heel cups of arch supports to help reduce stress on the plantar fascia. Occasionally, corticosteroid injections are given to reduce inflammation. If symptoms persist for greater than 6 months despite non-surgical (conservative), then surgery may be recommended.  MEDICATION   If pain medication is necessary, then nonsteroidal anti-inflammatory medications, such as aspirin and ibuprofen, or other minor pain relievers, such as acetaminophen, are often recommended.  Do not take pain medication within 7 days before surgery.  Prescription pain relievers may be given if deemed necessary by your caregiver. Use only as directed and only as much as you need.  Corticosteroid injections may be given by your caregiver. These injections should be reserved for the most serious cases, because they may only be given a certain number of times. HEAT AND COLD  Cold treatment (icing) relieves pain and reduces inflammation. Cold treatment should be applied for 10 to 15 minutes every 2 to 3 hours for inflammation and pain and immediately after any activity that aggravates your symptoms. Use ice packs or massage the area with a piece of ice (ice massage).  Heat treatment may be used prior to performing the stretching and strengthening activities prescribed by your caregiver, physical therapist, or athletic trainer. Use a heat pack or soak the injury in warm water. SEEK IMMEDIATE MEDICAL CARE IF:  Treatment  seems to offer no benefit, or the condition worsens.  Any medications produce adverse side effects. EXERCISES RANGE OF MOTION  (ROM) AND STRETCHING EXERCISES - Plantar Fasciitis (Heel Spur Syndrome) These exercises may help you when beginning to rehabilitate your injury. Your symptoms may resolve with or without further involvement from your physician, physical therapist or athletic trainer. While completing these exercises, remember:   Restoring tissue flexibility helps normal motion to return to the joints. This allows healthier, less painful movement and activity.  An effective stretch should be held for at least 30 seconds.  A stretch should never be painful. You should only feel a gentle lengthening or release in the stretched tissue. RANGE OF MOTION - Toe Extension, Flexion  Sit with your right / left leg crossed over your opposite knee.  Grasp your toes and gently pull them back toward the top of your foot. You should feel a stretch on the bottom of your toes and/or foot.  Hold this stretch for __________ seconds.  Now, gently pull your toes toward the bottom of your foot. You should feel a stretch on the top of your toes and or foot.  Hold this stretch for __________ seconds. Repeat __________ times. Complete this stretch __________ times per day.  RANGE OF MOTION - Ankle Dorsiflexion, Active Assisted  Remove shoes and sit on a chair that is preferably not on a carpeted surface.  Place right / left foot under knee. Extend your opposite leg for support.  Keeping your heel down, slide your right / left foot back toward the chair until you feel a stretch at your ankle or calf. If you do not feel a stretch, slide your bottom forward to the edge of the chair, while still keeping your heel down.  Hold this stretch for __________ seconds. Repeat __________ times. Complete this stretch __________ times per day.  STRETCH - Gastroc, Standing  Place hands on wall.  Extend right / left leg, keeping the front knee somewhat bent.  Slightly point your toes inward on your back foot.  Keeping your right / left  heel on the floor and your knee straight, shift your weight toward the wall, not allowing your back to arch.  You should feel a gentle stretch in the right / left calf. Hold this position for __________ seconds. Repeat __________ times. Complete this stretch __________ times per day. STRETCH - Soleus, Standing  Place hands on wall.  Extend right / left leg, keeping the other knee somewhat bent.  Slightly point your toes inward on your back foot.  Keep your right / left heel on the floor, bend your back knee, and slightly shift your weight over the back leg so that you feel a gentle stretch deep in your back calf.  Hold this position for __________ seconds. Repeat __________ times. Complete this stretch __________ times per day. STRETCH - Gastrocsoleus, Standing  Note: This exercise can place a lot of stress on your foot and ankle. Please complete this exercise only if specifically instructed by your caregiver.   Place the ball of your right / left foot on a step, keeping your other foot firmly on the same step.  Hold on to the wall or a rail for balance.  Slowly lift your other foot, allowing your body weight to press your heel down over the edge of the step.  You should feel a stretch in your right / left calf.  Hold this position for __________ seconds.  Repeat this exercise with a  slight bend in your right / left knee. Repeat __________ times. Complete this stretch __________ times per day.  STRENGTHENING EXERCISES - Plantar Fasciitis (Heel Spur Syndrome)  These exercises may help you when beginning to rehabilitate your injury. They may resolve your symptoms with or without further involvement from your physician, physical therapist or athletic trainer. While completing these exercises, remember:   Muscles can gain both the endurance and the strength needed for everyday activities through controlled exercises.  Complete these exercises as instructed by your physician, physical  therapist or athletic trainer. Progress the resistance and repetitions only as guided. STRENGTH - Towel Curls  Sit in a chair positioned on a non-carpeted surface.  Place your foot on a towel, keeping your heel on the floor.  Pull the towel toward your heel by only curling your toes. Keep your heel on the floor.  If instructed by your physician, physical therapist or athletic trainer, add ____________________ at the end of the towel. Repeat __________ times. Complete this exercise __________ times per day. STRENGTH - Ankle Inversion  Secure one end of a rubber exercise band/tubing to a fixed object (table, pole). Loop the other end around your foot just before your toes.  Place your fists between your knees. This will focus your strengthening at your ankle.  Slowly, pull your big toe up and in, making sure the band/tubing is positioned to resist the entire motion.  Hold this position for __________ seconds.  Have your muscles resist the band/tubing as it slowly pulls your foot back to the starting position. Repeat __________ times. Complete this exercises __________ times per day.  Document Released: 09/30/2005 Document Revised: 12/23/2011 Document Reviewed: 01/12/2009 [facility] Patient Information 2015 [place], [place]. This information is not intended to replace advice given to you by your health care provider. Make sure you discuss any questions you have with your health care provider.

## 2014-06-16 NOTE — ED Provider Notes (Signed)
CSN: [id number]     Arrival date & time 06/16/14  0912 History   First MD Initiated Contact with Patient 06/16/14 [phone]     Chief Complaint  Patient presents with  . Foot Pain     (Consider location/radiation/quality/duration/timing/severity/associated sxs/prior Treatment) HPI Michele Wood is a 53 y.o. female who presents to ED with complaint of left heel pain. Patient states the pain began yesterday while cooking. States pain is in the heel, does not radiate. She has had history of pain in the foot and was told by her doctor probably plantar fasciitis. Patient states that she does not think that's what it is. She denies any injuries. She denies stepping on anything. She denies any swelling to that area. No fever, chills, malaise. She did not try to medications prior to coming in. She called her primary care Dr. but they'll have an appointment until next week available.  Past Medical History  Diagnosis Date  . Hypertension   . Obesity   . Sleep apnea   . Chronic back pain    Past Surgical History  Procedure Laterality Date  . Abdominal hysterectomy     Family History  Problem Relation Age of Onset  . Hypertension Other    History  Substance Use Topics  . Smoking status: Never Smoker   . Smokeless tobacco: Not on file  . Alcohol Use: Yes     Comment: socially   OB History   Grav Para Term Preterm Abortions TAB SAB Ect Mult Living                 Review of Systems  Constitutional: Negative for fever and chills.  Respiratory: Negative for cough, chest tightness and shortness of breath.   Cardiovascular: Negative for chest pain, palpitations and leg swelling.  Musculoskeletal: Positive for arthralgias. Negative for myalgias and neck stiffness.  Skin: Negative for rash.  Neurological: Negative for dizziness.  All other systems reviewed and are negative.     Allergies  Codeine sulfate and Milk-related compounds  Home Medications   Prior to Admission  medications   Medication Sig Start Date End Date Taking? Authorizing Provider  cyclobenzaprine (FLEXERIL) 10 MG tablet Take 10 mg by mouth 2 (two) times daily as needed for muscle spasms.   Yes Historical Provider, MD  lisinopril-hydrochlorothiazide (PRINZIDE,ZESTORETIC) 20-12.5 MG per tablet Take 2 tablets by mouth daily.   Yes Historical Provider, MD  naproxen (NAPROSYN) 500 MG tablet Take 500 mg by mouth 2 (two) times daily with a meal.   Yes Historical Provider, MD  Vitamin D, Ergocalciferol, (DRISDOL) 50000 UNITS CAPS capsule Take 50,000 Units by mouth every 7 (seven) days.   Yes Historical Provider, MD  traMADol (ULTRAM) 50 MG tablet Take 100 mg by mouth every 6 (six) hours as needed (For pain.).     Historical Provider, MD   BP 142/79  Pulse 97  Temp(Src) 98 F (36.7 C) (Oral)  Resp 19  SpO2 100%  LMP 09/01/2013 Physical Exam  Nursing note and vitals reviewed. Constitutional: She appears well-developed and well-nourished. No distress.  HENT:  Head: Normocephalic.  Eyes: Conjunctivae are normal.  Neck: Neck supple.  Cardiovascular: Normal rate, regular rhythm and normal heart sounds.   Pulmonary/Chest: Effort normal and breath sounds normal. No respiratory distress. She has no wheezes. She has no rales.  Musculoskeletal: She exhibits no edema.  Normal appearing left heel. Achilles tendon intact and non tender. Pain with palpation over the heel and over plantar fascia. Pain with  toes extension.   Neurological: She is alert.  Skin: Skin is warm and dry.  Psychiatric: She has a normal mood and affect. Her behavior is normal.    ED Course  Procedures (including critical care time) Labs Review Labs Reviewed - No data to display  Imaging Review Dg Foot Complete Left  06/16/2014   CLINICAL DATA:  Foot pain  EXAM: LEFT FOOT - COMPLETE 3+ VIEW  COMPARISON:  03/22/2014  FINDINGS: Calcaneal spurring on the plantar surface unchanged.  Negative for fracture or arthropathy.  IMPRESSION:  Calcaneal spur unchanged.  No acute abnormality.   Electronically Signed   By: Franchot Gallo M.D.   On: 06/16/2014 10:58     EKG Interpretation None      MDM   Final diagnoses:  Calcaneal spur of foot, left  Plantar fasciitis of left foot    Patient with nontraumatic pain to the left heel and left plantar fascia of the left foot. X-ray showing calcaneal spur. I suspect patient has plantar fasciitis due to calcaneal spur syndrome. Home with naproxen, Ultram, ice, followup with podiatry primary care doctor.   Filed Vitals:   06/16/14 0922 06/16/14 0933  BP: 116/73 142/79  Pulse: 93 97  Temp: 98.3 F (36.8 C) 98 F (36.7 C)  TempSrc: Oral Oral  Resp: 20 19  SpO2: 97% 100%      Renold Genta, PA-C 06/16/14 1132

## 2014-06-16 NOTE — ED Notes (Signed)
Patient transported to X-ray 

## 2014-06-16 NOTE — ED Notes (Signed)
Pt c/o left heel pain that started last night while she was cooking. Pt denies stepping on anything or twisting or injuring it.  Pt states that she called her PCP and cant get in to she her provider until Tuesday. Pt states that the pain is worse with weightbearing.

## 2014-06-17 NOTE — ED Provider Notes (Signed)
Medical screening examination/treatment/procedure(s) were performed by non-physician practitioner and as supervising physician I was immediately available for consultation/collaboration.   EKG Interpretation None       Varney Biles, MD 06/17/14 [phone]

## 2014-07-04 ENCOUNTER — Ambulatory Visit: Payer: No Typology Code available for payment source | Attending: Internal Medicine

## 2014-07-11 ENCOUNTER — Ambulatory Visit: Payer: No Typology Code available for payment source | Attending: Internal Medicine

## 2014-09-01 ENCOUNTER — Other Ambulatory Visit: Payer: Self-pay | Admitting: Internal Medicine

## 2014-09-22 ENCOUNTER — Encounter: Payer: Self-pay | Admitting: Internal Medicine

## 2014-09-22 ENCOUNTER — Ambulatory Visit: Payer: Self-pay | Attending: Internal Medicine | Admitting: Internal Medicine

## 2014-09-22 ENCOUNTER — Ambulatory Visit (HOSPITAL_BASED_OUTPATIENT_CLINIC_OR_DEPARTMENT_OTHER): Payer: Self-pay

## 2014-09-22 VITALS — BP 137/89 | HR 92 | Temp 98.0°F | Resp 16 | Wt 273.2 lb

## 2014-09-22 DIAGNOSIS — M542 Cervicalgia: Secondary | ICD-10-CM | POA: Insufficient documentation

## 2014-09-22 DIAGNOSIS — Z79899 Other long term (current) drug therapy: Secondary | ICD-10-CM | POA: Insufficient documentation

## 2014-09-22 DIAGNOSIS — M62838 Other muscle spasm: Secondary | ICD-10-CM | POA: Insufficient documentation

## 2014-09-22 DIAGNOSIS — M791 Myalgia: Secondary | ICD-10-CM

## 2014-09-22 DIAGNOSIS — M47816 Spondylosis without myelopathy or radiculopathy, lumbar region: Secondary | ICD-10-CM

## 2014-09-22 DIAGNOSIS — Z23 Encounter for immunization: Secondary | ICD-10-CM | POA: Insufficient documentation

## 2014-09-22 DIAGNOSIS — Z90710 Acquired absence of both cervix and uterus: Secondary | ICD-10-CM | POA: Insufficient documentation

## 2014-09-22 DIAGNOSIS — M7918 Myalgia, other site: Secondary | ICD-10-CM

## 2014-09-22 DIAGNOSIS — I10 Essential (primary) hypertension: Secondary | ICD-10-CM | POA: Insufficient documentation

## 2014-09-22 DIAGNOSIS — M4802 Spinal stenosis, cervical region: Secondary | ICD-10-CM | POA: Insufficient documentation

## 2014-09-22 DIAGNOSIS — M549 Dorsalgia, unspecified: Secondary | ICD-10-CM | POA: Insufficient documentation

## 2014-09-22 DIAGNOSIS — M479 Spondylosis, unspecified: Secondary | ICD-10-CM | POA: Insufficient documentation

## 2014-09-22 MED ORDER — DULOXETINE HCL 20 MG PO CPEP: 20.0000 mg | ORAL_CAPSULE | Freq: Every day | ORAL | Status: DC

## 2014-09-22 MED ORDER — BACLOFEN 10 MG PO TABS: 10.0000 mg | ORAL_TABLET | Freq: Every day | ORAL | Status: DC

## 2014-09-22 NOTE — Progress Notes (Signed)
MRN: [id number] Name: Michele Wood  Sex: female Age: 53 y.o. DOB: 1961/02/25  Allergies: Codeine sulfate and Milk-related compounds  Chief Complaint  Patient presents with  . Back Pain    HPI: Patient is 53 y.o. female who  Has history of chronic neck pain cervicalgia spinal stenosis at the level of C3-C4, chronic DJD lower lumbar area, patient comes today requesting pain medication, she was also seen in the emergency room with symptoms of left foot/heel pain was diagnosed with plantar fasciitis and heel spur,she also has hypertension and takes Zestoretic and amlodipine, her blood pressure is controlled, she denies any headache dizziness chest and shortness of breath.  Past Medical History  Diagnosis Date  . Hypertension   . Obesity   . Sleep apnea   . Chronic back pain     Past Surgical History  Procedure Laterality Date  . Abdominal hysterectomy        Medication List       This list is accurate as of: 09/22/14  5:57 PM.  Always use your most recent med list.               amLODipine 10 MG tablet  Commonly known as:  NORVASC  TAKE 1 TABLET BY MOUTH ONCE DAILY     baclofen 10 MG tablet  Commonly known as:  LIORESAL  Take 1 tablet (10 mg total) by mouth at bedtime.     cyclobenzaprine 10 MG tablet  Commonly known as:  FLEXERIL  Take 10 mg by mouth 2 (two) times daily as needed for muscle spasms.     DULoxetine 20 MG capsule  Commonly known as:  CYMBALTA  Take 1 capsule (20 mg total) by mouth daily.     lisinopril-hydrochlorothiazide 20-12.5 MG per tablet  Commonly known as:  PRINZIDE,ZESTORETIC  Take 2 tablets by mouth daily.     naproxen 500 MG tablet  Commonly known as:  NAPROSYN  Take 500 mg by mouth 2 (two) times daily with a meal.     naproxen 500 MG tablet  Commonly known as:  NAPROSYN  Take 1 tablet (500 mg total) by mouth 2 (two) times daily.     traMADol 50 MG tablet  Commonly known as:  ULTRAM  Take 1 tablet (50 mg total)  by mouth every 6 (six) hours as needed.     traMADol 50 MG tablet  Commonly known as:  ULTRAM  Take 100 mg by mouth every 6 (six) hours as needed (For pain.).     Vitamin D (Ergocalciferol) 50000 UNITS Caps capsule  Commonly known as:  DRISDOL  Take 50,000 Units by mouth every 7 (seven) days.        Meds ordered this encounter  Medications  . DULoxetine (CYMBALTA) 20 MG capsule    Sig: Take 1 capsule (20 mg total) by mouth daily.    Dispense:  30 capsule    Refill:  3  . baclofen (LIORESAL) 10 MG tablet    Sig: Take 1 tablet (10 mg total) by mouth at bedtime.    Dispense:  30 each    Refill:  3    Immunization History  Administered Date(s) Administered  . Influenza Whole 09/13/2008  . Influenza,inj,Quad PF,36+ Mos 09/22/2014    Family History  Problem Relation Age of Onset  . Hypertension Other     History  Substance Use Topics  . Smoking status: Never Smoker   . Smokeless tobacco: Not on file  . Alcohol  Use: Yes     Comment: socially    Review of Systems   As noted in HPI  Filed Vitals:   09/22/14 1717  BP: 137/89  Pulse: 92  Temp: 98 F (36.7 C)  Resp: 16    Physical Exam  Physical Exam  Constitutional: No distress.  Eyes: EOM are normal. Pupils are equal, round, and reactive to light.  Neck: Neck supple.  Minimal limitation with full range of motion  Cardiovascular: Normal rate and regular rhythm.   Pulmonary/Chest: Breath sounds normal. No respiratory distress. She has no wheezes. She has no rales.  Musculoskeletal:  Some lower lumbar paraspinal tenderness.    CBC    Component Value Date/Time   WBC 7.2 12/03/2013 1508   RBC 4.68 12/03/2013 1508   HGB 12.4 12/03/2013 1508   HCT 36.4 12/03/2013 1508   PLT 371 12/03/2013 1508   MCV 77.8* 12/03/2013 1508   LYMPHSABS 2.8 12/03/2013 1508   MONOABS 0.5 12/03/2013 1508   EOSABS 0.1 12/03/2013 1508   BASOSABS 0.0 12/03/2013 1508    CMP     Component Value Date/Time   NA 137  03/25/2014 1231   K 3.8 03/25/2014 1231   CL 99 03/25/2014 1231   CO2 29 03/25/2014 1231   GLUCOSE 97 03/25/2014 1231   BUN 13 03/25/2014 1231   CREATININE 0.95 03/25/2014 1231   CREATININE 0.62 01/30/2009 2310   CALCIUM 9.4 03/25/2014 1231   PROT 7.1 03/25/2014 1231   ALBUMIN 3.9 03/25/2014 1231   AST 13 03/25/2014 1231   ALT 13 03/25/2014 1231   ALKPHOS 78 03/25/2014 1231   BILITOT 0.2 03/25/2014 1231   GFRNONAA 70 03/25/2014 1231   GFRAA 80 03/25/2014 1231    Lab Results  Component Value Date/Time   CHOL 202* 10/26/2013 09:49 AM    No components found for: HGA1C  Lab Results  Component Value Date/Time   AST 13 03/25/2014 12:31 PM    Assessment and Plan    Essential hypertension, benign - Plan: blood pressure is well controlled, continue current meds, repeat COMPLETE METABOLIC PANEL WITH GFR  Cervicalgia /Lumbar spondylosis, unspecified spinal osteoarthritis Have started patient on Cymbalta.  Musculoskeletal pain - Plan: DULoxetine (CYMBALTA) 20 MG capsule  Muscle spasm - Plan: baclofen (LIORESAL) 10 MG tablet, Advise patient for heating pad.   Health Maintenance  -Vaccinations: Flu shot given today.  Return in about 3 months (around 12/22/2014).  Lorayne Marek, MD

## 2014-09-22 NOTE — Progress Notes (Signed)
Patient here for follow up Complains of back pain neck pain and pain to her left ear

## 2014-09-23 LAB — COMPLETE METABOLIC PANEL WITH GFR
ALBUMIN: 3.8 g/dL (ref 3.5–5.2)
ALT: 13 U/L (ref 0–35)
AST: 14 U/L (ref 0–37)
Alkaline Phosphatase: 81 U/L (ref 39–117)
BUN: 15 mg/dL (ref 6–23)
CALCIUM: 9.8 mg/dL (ref 8.4–10.5)
CHLORIDE: 103 meq/L (ref 96–112)
CO2: 27 meq/L (ref 19–32)
Creat: 0.82 mg/dL (ref 0.50–1.10)
GFR, EST NON AFRICAN AMERICAN: 83 mL/min
GFR, Est African American: >89 mL/min
GLUCOSE: 90 mg/dL (ref 70–99)
Potassium: 4.2 mEq/L (ref 3.5–5.3)
SODIUM: 141 meq/L (ref 135–145)
Total Bilirubin: 0.3 mg/dL (ref 0.2–1.2)
Total Protein: 7.5 g/dL (ref 6.0–8.3)

## 2014-10-02 ENCOUNTER — Emergency Department (HOSPITAL_COMMUNITY)
Admission: EM | Admit: 2014-10-02 | Discharge: 2014-10-02 | Disposition: A | Discharge status: Home / Self Care | Payer: Self-pay | Attending: Emergency Medicine | Admitting: Emergency Medicine

## 2014-10-02 ENCOUNTER — Emergency Department (HOSPITAL_COMMUNITY): Payer: Self-pay

## 2014-10-02 ENCOUNTER — Encounter (HOSPITAL_COMMUNITY): Payer: Self-pay | Admitting: Emergency Medicine

## 2014-10-02 DIAGNOSIS — Z79899 Other long term (current) drug therapy: Secondary | ICD-10-CM | POA: Insufficient documentation

## 2014-10-02 DIAGNOSIS — M2550 Pain in unspecified joint: Secondary | ICD-10-CM | POA: Insufficient documentation

## 2014-10-02 DIAGNOSIS — M79671 Pain in right foot: Secondary | ICD-10-CM | POA: Insufficient documentation

## 2014-10-02 DIAGNOSIS — R52 Pain, unspecified: Secondary | ICD-10-CM

## 2014-10-02 DIAGNOSIS — G8929 Other chronic pain: Secondary | ICD-10-CM | POA: Insufficient documentation

## 2014-10-02 DIAGNOSIS — R2241 Localized swelling, mass and lump, right lower limb: Secondary | ICD-10-CM | POA: Insufficient documentation

## 2014-10-02 DIAGNOSIS — E669 Obesity, unspecified: Secondary | ICD-10-CM | POA: Insufficient documentation

## 2014-10-02 DIAGNOSIS — M129 Arthropathy, unspecified: Secondary | ICD-10-CM | POA: Insufficient documentation

## 2014-10-02 DIAGNOSIS — Z791 Long term (current) use of non-steroidal anti-inflammatories (NSAID): Secondary | ICD-10-CM | POA: Insufficient documentation

## 2014-10-02 DIAGNOSIS — I10 Essential (primary) hypertension: Secondary | ICD-10-CM | POA: Insufficient documentation

## 2014-10-02 MED ORDER — HYDROCODONE-ACETAMINOPHEN 5-325 MG PO TABS: 1.0000 | ORAL_TABLET | ORAL | Status: DC | PRN

## 2014-10-02 MED ORDER — ONDANSETRON 8 MG PO TBDP
8.0000 mg | ORAL_TABLET | Freq: Once | ORAL | Status: AC
  Administered 2014-10-02: 8 mg via ORAL
  Filled 2014-10-02: qty 1

## 2014-10-02 MED ORDER — HYDROCODONE-ACETAMINOPHEN 5-325 MG PO TABS
1.0000 | ORAL_TABLET | Freq: Once | ORAL | Status: AC
  Administered 2014-10-02: 1 via ORAL
  Filled 2014-10-02: qty 1

## 2014-10-02 MED ORDER — TRAMADOL HCL 50 MG PO TABS
50.0000 mg | ORAL_TABLET | Freq: Once | ORAL | Status: AC
Start: 2014-10-02 — End: 2014-10-02
  Administered 2014-10-02: 50 mg via ORAL
  Filled 2014-10-02: qty 1

## 2014-10-02 NOTE — ED Provider Notes (Signed)
CSN: [id number]     Arrival date & time 10/02/14  1258 History  This chart was scribed for Clayton Bibles, PA-C, working with Carmin Muskrat, MD by Steva Colder, ED Scribe. The patient was seen in room WTR5/WTR5 at 3:12 PM.    Chief Complaint  Patient presents with  . Foot Pain    The history is provided by the patient. No language interpreter was used.   HPI Comments: Michele Wood is a 53 y.o. female with a hx of arthritis, bone spurs, chronic back pain, DDD who presents to the Emergency Department complaining of foot pain onset 2 days. She thought that she hit it on something when the pain had began but when she woke up on Friday the pain was only on the top of her foot. She reports that she takes pain medications and the pain decreased. She states that the pain was so bad this morning that she couldn't walk. The pain radiates up her right shin. She states that she is having associated symptoms of tingling in the right foot. She notes that her legs will go numb occasionally for the past year. She denies fever, and any other symptoms. Was dx with plantar faciatis on the left foot. Denies DM. Denies being on her feet a lot. When she first noticed the pain she stepped on it and noticed it. Has not been on any abx or steroids.  Past Medical History  Diagnosis Date  . Hypertension   . Obesity   . Sleep apnea   . Chronic back pain    Past Surgical History  Procedure Laterality Date  . Abdominal hysterectomy     Family History  Problem Relation Age of Onset  . Hypertension Other    History  Substance Use Topics  . Smoking status: Never Smoker   . Smokeless tobacco: Not on file  . Alcohol Use: Yes     Comment: socially   OB History    No data available     Review of Systems  Constitutional: Negative for fever.  Respiratory: Negative for shortness of breath.   Cardiovascular: Negative for chest pain and leg swelling.  Musculoskeletal: Positive for myalgias, joint  swelling and arthralgias.  Skin: Negative for color change, rash and wound.  Allergic/Immunologic: Negative for immunocompromised state.  Psychiatric/Behavioral: Negative for self-injury.  All other systems reviewed and are negative.     Allergies  Codeine sulfate and Milk-related compounds  Home Medications   Prior to Admission medications   Medication Sig Start Date End Date Taking? Authorizing Provider  amLODipine (NORVASC) 10 MG tablet TAKE 1 TABLET BY MOUTH ONCE DAILY 09/01/14   Lorayne Marek, MD  baclofen (LIORESAL) 10 MG tablet Take 1 tablet (10 mg total) by mouth at bedtime. 09/22/14   Lorayne Marek, MD  cyclobenzaprine (FLEXERIL) 10 MG tablet Take 10 mg by mouth 2 (two) times daily as needed for muscle spasms.    Historical Provider, MD  DULoxetine (CYMBALTA) 20 MG capsule Take 1 capsule (20 mg total) by mouth daily. 09/22/14   Lorayne Marek, MD  lisinopril-hydrochlorothiazide (PRINZIDE,ZESTORETIC) 20-12.5 MG per tablet Take 2 tablets by mouth daily.    Historical Provider, MD  naproxen (NAPROSYN) 500 MG tablet Take 500 mg by mouth 2 (two) times daily with a meal.    Historical Provider, MD  naproxen (NAPROSYN) 500 MG tablet Take 1 tablet (500 mg total) by mouth 2 (two) times daily. 06/16/14   Tatyana A Kirichenko, PA-C  traMADol (ULTRAM) 50 MG tablet  Take 100 mg by mouth every 6 (six) hours as needed (For pain.).     Historical Provider, MD  traMADol (ULTRAM) 50 MG tablet Take 1 tablet (50 mg total) by mouth every 6 (six) hours as needed. 06/16/14   Tatyana A Kirichenko, PA-C  Vitamin D, Ergocalciferol, (DRISDOL) 50000 UNITS CAPS capsule Take 50,000 Units by mouth every 7 (seven) days.    Historical Provider, MD   BP 139/73 mmHg  Pulse 102  Temp(Src) 98.4 F (36.9 C) (Oral)  Resp 20  SpO2 96%  LMP 09/01/2013  Physical Exam  Constitutional: She appears well-developed and well-nourished. No distress.  HENT:  Head: Normocephalic and atraumatic.  Neck: Neck supple.   Pulmonary/Chest: Effort normal.  Musculoskeletal:       Right foot: There is tenderness and swelling.       Feet:  No calf tenderness. No erythema. Tenderness localized swelling over the dorsum of the foot. No warmth. Tenderness over the mid foot over the plantar aspect. Sensation intact.  Dorsalis pedis pulses intact, capillary refill < 2 seconds in all digits. Pt able to move all toes.     Neurological: She is alert.  Skin: She is not diaphoretic.  Nursing note and vitals reviewed.   ED Course  Procedures (including critical care time) DIAGNOSTIC STUDIES: Oxygen Saturation is 96% on room air, normal by my interpretation.    COORDINATION OF CARE: 3:19 PM-Discussed treatment plan which includes right foot X-ray, pain management with pt at bedside and pt agreed to plan.   Labs Review Labs Reviewed - No data to display  Imaging Review Dg Foot Complete Right  10/02/2014   CLINICAL DATA:  Dorsal right foot pain. Denies trauma. Sharp and throbbing since stations.  EXAM: RIGHT FOOT COMPLETE - 3+ VIEW  COMPARISON:  Right ankle 03/18/2006  FINDINGS: Negative for a fracture or dislocation. There is a prominent spur along the plantar aspect of the calcaneus. Alignment of the right foot is normal. No focal soft tissue abnormality.  IMPRESSION: No acute abnormality.  Calcaneal spur.   Electronically Signed   By: Markus Daft M.D.   On: 10/02/2014 15:24     EKG Interpretation None       4:47 PM Per verbal report, pt has calcaneal spur and no other abnormality on xray.   [place] checked, no controlled substances found for this patient.    Discussed pt's allergy to codeine.  Pt states she has taken hydrocodone in the past and her only reaction is that she has nightmares.  Despite this, she would like to try something stronger for pain than tramadol.   MDM   Final diagnoses:  Pain  Right foot pain   Afebrile, nontoxic patient with right dorsal foot pain without injury.  No clinical  e/o cellulitis, abscess, gout, septic joint. Xray shows only calcaneal spur.  D/C home with ace wrap, rx for walker, norco, PCP follow up.  Discussed result, findings, treatment, and follow up  with patient.  Pt given return precautions.  Pt verbalizes understanding and agrees with plan.        I personally performed the services described in this documentation, which was scribed in my presence. The recorded information has been reviewed and is accurate.    Clayton Bibles, PA-C 10/02/14 [place], MD 10/02/14 [phone]

## 2014-10-02 NOTE — ED Notes (Signed)
Pt c/o dorsal foot pain onset Friday, worsening until today. Denies trauma.

## 2014-10-02 NOTE — Discharge Instructions (Signed)

## 2014-10-20 ENCOUNTER — Other Ambulatory Visit: Payer: Self-pay | Admitting: Internal Medicine

## 2014-10-21 ENCOUNTER — Ambulatory Visit: Payer: Self-pay | Attending: Internal Medicine

## 2014-10-24 ENCOUNTER — Other Ambulatory Visit: Payer: Self-pay

## 2014-10-24 DIAGNOSIS — M7918 Myalgia, other site: Secondary | ICD-10-CM

## 2014-10-24 MED ORDER — DULOXETINE HCL 20 MG PO CPEP: 20.0000 mg | ORAL_CAPSULE | Freq: Every day | ORAL | Status: DC

## 2014-10-28 ENCOUNTER — Emergency Department (HOSPITAL_COMMUNITY): Payer: Self-pay

## 2014-10-28 ENCOUNTER — Emergency Department (HOSPITAL_COMMUNITY)
Admission: EM | Admit: 2014-10-28 | Discharge: 2014-10-28 | Disposition: A | Discharge status: Home / Self Care | Payer: Self-pay | Attending: Emergency Medicine | Admitting: Emergency Medicine

## 2014-10-28 ENCOUNTER — Encounter (HOSPITAL_COMMUNITY): Payer: Self-pay

## 2014-10-28 DIAGNOSIS — M79672 Pain in left foot: Secondary | ICD-10-CM | POA: Insufficient documentation

## 2014-10-28 DIAGNOSIS — E669 Obesity, unspecified: Secondary | ICD-10-CM | POA: Insufficient documentation

## 2014-10-28 DIAGNOSIS — M79673 Pain in unspecified foot: Secondary | ICD-10-CM

## 2014-10-28 DIAGNOSIS — Z79899 Other long term (current) drug therapy: Secondary | ICD-10-CM | POA: Insufficient documentation

## 2014-10-28 DIAGNOSIS — Z791 Long term (current) use of non-steroidal anti-inflammatories (NSAID): Secondary | ICD-10-CM | POA: Insufficient documentation

## 2014-10-28 DIAGNOSIS — Z8669 Personal history of other diseases of the nervous system and sense organs: Secondary | ICD-10-CM | POA: Insufficient documentation

## 2014-10-28 DIAGNOSIS — I10 Essential (primary) hypertension: Secondary | ICD-10-CM | POA: Insufficient documentation

## 2014-10-28 DIAGNOSIS — G8929 Other chronic pain: Secondary | ICD-10-CM | POA: Insufficient documentation

## 2014-10-28 MED ORDER — NAPROXEN 500 MG PO TABS: 500.0000 mg | ORAL_TABLET | Freq: Two times a day (BID) | ORAL | Status: DC

## 2014-10-28 MED ORDER — HYDROCODONE-ACETAMINOPHEN 5-325 MG PO TABS: 1.0000 | ORAL_TABLET | ORAL | Status: DC | PRN

## 2014-10-28 NOTE — Discharge Instructions (Signed)
Foot Sprain The muscles and cord like structures which attach muscle to bone (tendons) that surround the feet are made up of units. A foot sprain can occur at the weakest spot in any of these units. This condition is most often caused by injury to or overuse of the foot, as from playing contact sports, or aggravating a previous injury, or from poor conditioning, or obesity. SYMPTOMS  Pain with movement of the foot.  Tenderness and swelling at the injury site.  Loss of strength is present in moderate or severe sprains. THE THREE GRADES OR SEVERITY OF FOOT SPRAIN ARE:  Mild (Grade I): Slightly pulled muscle without tearing of muscle or tendon fibers or loss of strength.  Moderate (Grade II): Tearing of fibers in a muscle, tendon, or at the attachment to bone, with small decrease in strength.  Severe (Grade III): Rupture of the muscle-tendon-bone attachment, with separation of fibers. Severe sprain requires surgical repair. Often repeating (chronic) sprains are caused by overuse. Sudden (acute) sprains are caused by direct injury or over-use. DIAGNOSIS  Diagnosis of this condition is usually by your own observation. If problems continue, a caregiver may be required for further evaluation and treatment. X-rays may be required to make sure there are not breaks in the bones (fractures) present. Continued problems may require physical therapy for treatment. PREVENTION  Use strength and conditioning exercises appropriate for your sport.  Warm up properly prior to working out.  Use athletic shoes that are made for the sport you are participating in.  Allow adequate time for healing. Early return to activities makes repeat injury more likely, and can lead to an unstable arthritic foot that can result in prolonged disability. Mild sprains generally heal in 3 to 10 days, with moderate and severe sprains taking 2 to 10 weeks. Your caregiver can help you determine the proper time required for  healing. HOME CARE INSTRUCTIONS   Apply ice to the injury for 15-20 minutes, 03-04 times per day. Put the ice in a plastic bag and place a towel between the bag of ice and your skin.  An elastic wrap (like an Ace bandage) may be used to keep swelling down.  Keep foot above the level of the heart, or at least raised on a footstool, when swelling and pain are present.  Try to avoid use other than gentle range of motion while the foot is painful. Do not resume use until instructed by your caregiver. Then begin use gradually, not increasing use to the point of pain. If pain does develop, decrease use and continue the above measures, gradually increasing activities that do not cause discomfort, until you gradually achieve normal use.  Use crutches if and as instructed, and for the length of time instructed.  Keep injured foot and ankle wrapped between treatments.  Massage foot and ankle for comfort and to keep swelling down. Massage from the toes up towards the knee.  Only take over-the-counter or prescription medicines for pain, discomfort, or fever as directed by your caregiver. SEEK IMMEDIATE MEDICAL CARE IF:   Your pain and swelling increase, or pain is not controlled with medications.  You have loss of feeling in your foot or your foot turns cold or blue.  You develop new, unexplained symptoms, or an increase of the symptoms that brought you to your caregiver. MAKE SURE YOU:   Understand these instructions.  Will watch your condition.  Will get help right away if you are not doing well or get worse. Document Released:   03/22/2002 Document Revised: 12/23/2011 Document Reviewed: 05/19/2008 Exit[employer] Patient Information 2015 [employer]. This information is not intended to replace advice given to you by your health care provider. Make sure you discuss any questions you have with your health care provider.  

## 2014-10-28 NOTE — ED Notes (Signed)
Pt states pain in left foot.  Pain is getting worse. Was seen previously for opposite foot.  Denies trauma.

## 2014-10-28 NOTE — ED Provider Notes (Signed)
CSN: [id number]     Arrival date & time 10/28/14  1325 History   None    Chief Complaint  Patient presents with  . Foot Pain    Patient is a 54 y.o. female presenting with lower extremity pain. The history is provided by the patient. No language interpreter was used.  Foot Pain  This chart was scribed for non-physician practitioner Waynetta Pean, PA-C,  working with Wandra Arthurs, MD, by Thea Alken, ED Scribe. This patient was seen in room WTR9/WTR9 and the patient's care was started at 3:08 PM.  Michele Wood is a 54 y.o. female who presents to the Emergency Department complaining of worsening left foot pain that began 1-2 days ago. Pt denies injury or trauma to left foot. She rates pain 8/10 and states pain radiates up to left knee. Pt has soaked foot in warm water as well as taken tylenol pm, last dose being 7.5 hours ago. Pt states this pain is similar to the right foot pain that she was seen for previously. Pt denies fever. She denies hx of gout.   Past Medical History  Diagnosis Date  . Hypertension   . Obesity   . Sleep apnea   . Chronic back pain    Past Surgical History  Procedure Laterality Date  . Abdominal hysterectomy     Family History  Problem Relation Age of Onset  . Hypertension Other    History  Substance Use Topics  . Smoking status: Never Smoker   . Smokeless tobacco: Not on file  . Alcohol Use: Yes     Comment: socially   OB History    No data available     Review of Systems  Constitutional: Negative for fever.  Musculoskeletal:       Left foot pain and swelling  Skin: Negative for color change, pallor, rash and wound.  Neurological: Negative for weakness and numbness.   Allergies  Milk-related compounds and Codeine sulfate  Home Medications   Prior to Admission medications   Medication Sig Start Date End Date Taking? Authorizing Provider  amLODipine (NORVASC) 10 MG tablet TAKE 1 TABLET BY MOUTH ONCE DAILY 09/01/14   Lorayne Marek, MD  baclofen (LIORESAL) 10 MG tablet Take 1 tablet (10 mg total) by mouth at bedtime. 09/22/14   Lorayne Marek, MD  Cholecalciferol (VITAMIN D) 2000 UNITS tablet Take 2,000 Units by mouth daily.    Historical Provider, MD  cyclobenzaprine (FLEXERIL) 10 MG tablet Take 10 mg by mouth at bedtime.     Historical Provider, MD  cyclobenzaprine (FLEXERIL) 10 MG tablet TAKE 1 TABLET BY MOUTH TWICE DAILY AS NEEDED FOR MUSCLE SPASMS 10/21/14   Lorayne Marek, MD  DULoxetine (CYMBALTA) 20 MG capsule Take 1 capsule (20 mg total) by mouth daily. 10/24/14   Lorayne Marek, MD  HYDROcodone-acetaminophen (NORCO/VICODIN) 5-325 MG per tablet Take 1 tablet by mouth every 4 (four) hours as needed for moderate pain or severe pain. 10/02/14   Clayton Bibles, PA-C  influenza vac recombinant HA trivalent (FLUBLOK) injection Inject 0.5 mLs into the muscle once.    Historical Provider, MD  lisinopril-hydrochlorothiazide (PRINZIDE,ZESTORETIC) 20-12.5 MG per tablet Take 1 tablet by mouth daily.     Historical Provider, MD  naproxen (NAPROSYN) 500 MG tablet Take 1 tablet (500 mg total) by mouth 2 (two) times daily. 06/16/14   Tatyana A Kirichenko, PA-C  traMADol (ULTRAM) 50 MG tablet Take 1 tablet (50 mg total) by mouth every 6 (six) hours as needed.  Patient not taking: Reported on 10/02/2014 06/16/14   Lahoma Rocker A Kirichenko, PA-C   BP 119/85 mmHg  Pulse 94  Temp(Src) 98.4 F (36.9 C) (Oral)  Resp 18  SpO2 100%  LMP 09/01/2013 Physical Exam  Constitutional: She is oriented to person, place, and time. She appears well-developed and well-nourished. No distress.  HENT:  Head: Normocephalic and atraumatic.  Eyes: Conjunctivae are normal. Pupils are equal, round, and reactive to light. Right eye exhibits no discharge. Left eye exhibits no discharge.  Neck: Neck supple.  Cardiovascular: Normal rate, regular rhythm, normal heart sounds and intact distal pulses.  Exam reveals no gallop and no friction rub.   No murmur heard. DP  pulses intact.    Pulmonary/Chest: Effort normal.  Musculoskeletal: Normal range of motion.  Moderate amount of edema to left lateral foot. No leg swelling. No bony abnormality. No calf pain or swelling. Patient has good range of motion to her ankle and foot.   Neurological: She is alert and oriented to person, place, and time. Coordination normal.  Skin: Skin is warm and dry.  Psychiatric: She has a normal mood and affect. Her behavior is normal.  Nursing note and vitals reviewed.   ED Course  Procedures (including critical care time) DIAGNOSTIC STUDIES: Oxygen Saturation is 100% on RA, normal by my interpretation.    COORDINATION OF CARE: 3:24 PM- Pt advised of plan for treatment which includes naproxen and pt agrees.  Labs Review Labs Reviewed - No data to display  Imaging Review Dg Foot Complete Left  10/28/2014   CLINICAL DATA:  Initial encounter for lateral swelling to top of foot when patient woke up this morning. No history of injury.  EXAM: LEFT FOOT - COMPLETE 3+ VIEW  COMPARISON:  06/16/2014  FINDINGS: There is no evidence of fracture or dislocation. There is no evidence of arthropathy or other focal bone abnormality. Plantar spur extending from the calcaneal tuberosity is unchanged. Soft tissues are unremarkable.  IMPRESSION: Negative.   Electronically Signed   By: Misty Stanley M.D.   On: 10/28/2014 14:30     EKG Interpretation None      Filed Vitals:   10/28/14 1352 10/28/14 1411 10/28/14 1549  BP: 123/64 119/85   Pulse: 93 94 90  Temp: 98.4 F (36.9 C)    TempSrc: Oral    Resp: 16 18   SpO2: 97% 100% 96%     MDM   Meds given in ED:  Medications - No data to display  Discharge Medication List as of 10/28/2014  3:30 PM      Final diagnoses:  Foot pain   This is a 54 year old female who presented emergency department complaining of left foot pain began 1-2 days ago. Patient does has a history of arthritis. No history of diabetes. Patient denies trauma  to her left foot. The patient has a mild amount of edema to her left lateral foot. Patient has good range of motion of her ankle and foot. I doubt septic joint or gout.  Patient's left foot x-ray is unremarkable. Patient placed in Ace wrap. Patient reports she has walker for use at home. Patient provided prescriptions for pain medicine.  Patient has primary care doctor to follow-up with beginning of next week. I advised the patient to follow-up with their primary care provider this week. I advised the patient to return to the emergency department with new or worsening symptoms or new concerns. The patient verbalized understanding and agreement with plan.    I  personally performed the services described in this documentation, which was scribed in my presence. The recorded information has been reviewed and is accurate.   Hanley Hays, PA-C 10/28/14 2234  Wandra Arthurs, MD 10/31/14 [phone]

## 2014-11-07 ENCOUNTER — Encounter: Payer: Self-pay | Admitting: Internal Medicine

## 2014-11-07 ENCOUNTER — Ambulatory Visit: Payer: Self-pay | Attending: Internal Medicine | Admitting: Internal Medicine

## 2014-11-07 VITALS — BP 152/98 | HR 91 | Temp 98.0°F | Resp 16 | Wt 271.6 lb

## 2014-11-07 DIAGNOSIS — Z1231 Encounter for screening mammogram for malignant neoplasm of breast: Secondary | ICD-10-CM

## 2014-11-07 DIAGNOSIS — E669 Obesity, unspecified: Secondary | ICD-10-CM | POA: Insufficient documentation

## 2014-11-07 DIAGNOSIS — I10 Essential (primary) hypertension: Secondary | ICD-10-CM | POA: Insufficient documentation

## 2014-11-07 DIAGNOSIS — M7989 Other specified soft tissue disorders: Secondary | ICD-10-CM | POA: Insufficient documentation

## 2014-11-07 NOTE — Progress Notes (Signed)
MRN: [id number] Name: Michele Wood  Sex: female Age: 54 y.o. DOB: 08-12-1961  Allergies: Milk-related compounds and Codeine sulfate  Chief Complaint  Patient presents with  . Follow-up    HPI: Patient is 54 y.o. female who recently went to the emergency room with the symptoms of left foot pain/swelling, EMR reviewed patient denies any trauma, she had a left foot x-ray done which was unremarkable, she was placed in ACE wrap , was prescribed pain medication, patient reports improvement in the swelling denies any pain fever chills. Patient also history of hypertension today her blood pressure is elevated denies any acute symptoms.  Past Medical History  Diagnosis Date  . Hypertension   . Obesity   . Sleep apnea   . Chronic back pain     Past Surgical History  Procedure Laterality Date  . Abdominal hysterectomy        Medication List       This list is accurate as of: 11/07/14  4:40 PM.  Always use your most recent med list.               amLODipine 10 MG tablet  Commonly known as:  NORVASC  TAKE 1 TABLET BY MOUTH ONCE DAILY     baclofen 10 MG tablet  Commonly known as:  LIORESAL  Take 1 tablet (10 mg total) by mouth at bedtime.     cyclobenzaprine 10 MG tablet  Commonly known as:  FLEXERIL  Take 10 mg by mouth at bedtime.     cyclobenzaprine 10 MG tablet  Commonly known as:  FLEXERIL  TAKE 1 TABLET BY MOUTH TWICE DAILY AS NEEDED FOR MUSCLE SPASMS     DULoxetine 20 MG capsule  Commonly known as:  CYMBALTA  Take 1 capsule (20 mg total) by mouth daily.     HYDROcodone-acetaminophen 5-325 MG per tablet  Commonly known as:  NORCO/VICODIN  Take 1 tablet by mouth every 4 (four) hours as needed for moderate pain or severe pain.     influenza vac recombinant HA trivalent injection  Commonly known as:  FLUBLOK  Inject 0.5 mLs into the muscle once.     lisinopril-hydrochlorothiazide 20-12.5 MG per tablet  Commonly known as:  PRINZIDE,ZESTORETIC    Take 1 tablet by mouth daily.     naproxen 500 MG tablet  Commonly known as:  NAPROSYN  Take 1 tablet (500 mg total) by mouth 2 (two) times daily.     traMADol 50 MG tablet  Commonly known as:  ULTRAM  Take 1 tablet (50 mg total) by mouth every 6 (six) hours as needed.     Vitamin D 2000 UNITS tablet  Take 2,000 Units by mouth daily.        No orders of the defined types were placed in this encounter.    Immunization History  Administered Date(s) Administered  . Influenza Whole 09/13/2008  . Influenza,inj,Quad PF,36+ Mos 09/22/2014    Family History  Problem Relation Age of Onset  . Hypertension Other     History  Substance Use Topics  . Smoking status: Never Smoker   . Smokeless tobacco: Not on file  . Alcohol Use: Yes     Comment: socially    Review of Systems   As noted in HPI  Filed Vitals:   11/07/14 1525  BP: 152/98  Pulse: 91  Temp: 98 F (36.7 C)  Resp: 16    Physical Exam  Physical Exam  Constitutional: No distress.  Eyes:  EOM are normal. Pupils are equal, round, and reactive to light.  Cardiovascular: Normal rate and regular rhythm.   Pulmonary/Chest: Breath sounds normal. No respiratory distress. She has no wheezes. She has no rales.  Musculoskeletal:  Bilateral feet minimal swelling no tenderness 2+ dorsalis pedis pulse, ankle good ROM     CBC    Component Value Date/Time   WBC 7.2 12/03/2013 1508   RBC 4.68 12/03/2013 1508   HGB 12.4 12/03/2013 1508   HCT 36.4 12/03/2013 1508   PLT 371 12/03/2013 1508   MCV 77.8* 12/03/2013 1508   LYMPHSABS 2.8 12/03/2013 1508   MONOABS 0.5 12/03/2013 1508   EOSABS 0.1 12/03/2013 1508   BASOSABS 0.0 12/03/2013 1508    CMP     Component Value Date/Time   NA 141 09/22/2014 1758   K 4.2 09/22/2014 1758   CL 103 09/22/2014 1758   CO2 27 09/22/2014 1758   GLUCOSE 90 09/22/2014 1758   BUN 15 09/22/2014 1758   CREATININE 0.82 09/22/2014 1758   CREATININE 0.62 01/30/2009 2310   CALCIUM  9.8 09/22/2014 1758   PROT 7.5 09/22/2014 1758   ALBUMIN 3.8 09/22/2014 1758   AST 14 09/22/2014 1758   ALT 13 09/22/2014 1758   ALKPHOS 81 09/22/2014 1758   BILITOT 0.3 09/22/2014 1758   GFRNONAA 83 09/22/2014 1758   GFRAA >89 09/22/2014 1758    Lab Results  Component Value Date/Time   CHOL 202* 10/26/2013 09:49 AM    No components found for: HGA1C  Lab Results  Component Value Date/Time   AST 14 09/22/2014 05:58 PM    Assessment and Plan  Bilateral swelling of feet Swelling is improved, advise patient for leg elevation, advised for low-salt diet.  Encounter for screening mammogram for breast cancer - Plan: MM DIGITAL SCREENING BILATERAL  Essential hypertension, benign Advised patient for DASH diet, continue with current meds, reevaluate on next visit.  Health Maintenance  -Mammogram: ordered  -Vaccinations:  uptodate with flu shot   Coron Rossano, Vernon Prey, MD

## 2014-11-07 NOTE — Patient Instructions (Signed)
DASH Eating Plan °DASH stands for "Dietary Approaches to Stop Hypertension." The DASH eating plan is a healthy eating plan that has been shown to reduce high blood pressure (hypertension). Additional health benefits may include reducing the risk of type 2 diabetes mellitus, heart disease, and stroke. The DASH eating plan may also help with weight loss. °WHAT DO I NEED TO KNOW ABOUT THE DASH EATING PLAN? °For the DASH eating plan, you will follow these general guidelines: °· Choose foods with a percent daily value for sodium of less than 5% (as listed on the food label). °· Use salt-free seasonings or herbs instead of table salt or sea salt. °· Check with your health care provider or pharmacist before using salt substitutes. °· Eat lower-sodium products, often labeled as "lower sodium" or "no salt added." °· Eat fresh foods. °· Eat more vegetables, fruits, and low-fat dairy products. °· Choose whole grains. Look for the word "whole" as the first word in the ingredient list. °· Choose fish and skinless chicken or turkey more often than red meat. Limit fish, poultry, and meat to 6 oz (170 g) each day. °· Limit sweets, desserts, sugars, and sugary drinks. °· Choose heart-healthy fats. °· Limit cheese to 1 oz (28 g) per day. °· Eat more home-cooked food and less restaurant, buffet, and fast food. °· Limit fried foods. °· Cook foods using methods other than frying. °· Limit canned vegetables. If you do use them, rinse them well to decrease the sodium. °· When eating at a restaurant, ask that your food be prepared with less salt, or no salt if possible. °WHAT FOODS CAN I EAT? °Seek help from a dietitian for individual calorie needs. °Grains °Whole grain or whole wheat bread. Brown rice. Whole grain or whole wheat pasta. Quinoa, bulgur, and whole grain cereals. Low-sodium cereals. Corn or whole wheat flour tortillas. Whole grain cornbread. Whole grain crackers. Low-sodium crackers. °Vegetables °Fresh or frozen vegetables  (raw, steamed, roasted, or grilled). Low-sodium or reduced-sodium tomato and vegetable juices. Low-sodium or reduced-sodium tomato sauce and paste. Low-sodium or reduced-sodium canned vegetables.  °Fruits °All fresh, canned (in natural juice), or frozen fruits. °Meat and Other Protein Products °Ground beef (85% or leaner), grass-fed beef, or beef trimmed of fat. Skinless chicken or turkey. Ground chicken or turkey. Pork trimmed of fat. All fish and seafood. Eggs. Dried beans, peas, or lentils. Unsalted nuts and seeds. Unsalted canned beans. °Dairy °Low-fat dairy products, such as skim or 1% milk, 2% or reduced-fat cheeses, low-fat ricotta or cottage cheese, or plain low-fat yogurt. Low-sodium or reduced-sodium cheeses. °Fats and Oils °Tub margarines without trans fats. Light or reduced-fat mayonnaise and salad dressings (reduced sodium). Avocado. Safflower, olive, or canola oils. Natural peanut or almond butter. °Other °Unsalted popcorn and pretzels. °The items listed above may not be a complete list of recommended foods or beverages. Contact your dietitian for more options. °WHAT FOODS ARE NOT RECOMMENDED? °Grains °White bread. White pasta. White rice. Refined cornbread. Bagels and croissants. Crackers that contain trans fat. °Vegetables °Creamed or fried vegetables. Vegetables in a cheese sauce. Regular canned vegetables. Regular canned tomato sauce and paste. Regular tomato and vegetable juices. °Fruits °Dried fruits. Canned fruit in light or heavy syrup. Fruit juice. °Meat and Other Protein Products °Fatty cuts of meat. Ribs, chicken wings, bacon, sausage, bologna, salami, chitterlings, fatback, hot dogs, bratwurst, and packaged luncheon meats. Salted nuts and seeds. Canned beans with salt. °Dairy °Whole or 2% milk, cream, half-and-half, and cream cheese. Whole-fat or sweetened yogurt. Full-fat   cheeses or blue cheese. Nondairy creamers and whipped toppings. Processed cheese, cheese spreads, or cheese  curds. °Condiments °Onion and garlic salt, seasoned salt, table salt, and sea salt. Canned and packaged gravies. Worcestershire sauce. Tartar sauce. Barbecue sauce. Teriyaki sauce. Soy sauce, including reduced sodium. Steak sauce. Fish sauce. Oyster sauce. Cocktail sauce. Horseradish. Ketchup and mustard. Meat flavorings and tenderizers. Bouillon cubes. Hot sauce. Tabasco sauce. Marinades. Taco seasonings. Relishes. °Fats and Oils °Butter, stick margarine, lard, shortening, ghee, and bacon fat. Coconut, palm kernel, or palm oils. Regular salad dressings. °Other °Pickles and olives. Salted popcorn and pretzels. °The items listed above may not be a complete list of foods and beverages to avoid. Contact your dietitian for more information. °WHERE CAN I FIND MORE INFORMATION? °National Heart, [employer], and [employer] Institute: www.nhlbi.nih.gov/health/health-topics/topics/dash/ °Document Released: 09/19/2011 Document Revised: 02/14/2014 Document Reviewed: 08/04/2013 °ExitCare® Patient Information ©2015 [employer]. This information is not intended to replace advice given to you by your health care provider. Make sure you discuss any questions you have with your health care provider. ° °

## 2014-11-07 NOTE — Progress Notes (Signed)
Patient here for follow up from the ED Patient was seen for bilateral swelling to her feet Was given some pain medication and a walker to help Her ambulate

## 2014-12-22 ENCOUNTER — Ambulatory Visit: Payer: Self-pay | Attending: Internal Medicine

## 2014-12-22 ENCOUNTER — Other Ambulatory Visit: Payer: Self-pay | Admitting: Internal Medicine

## 2015-02-22 ENCOUNTER — Other Ambulatory Visit: Payer: Self-pay | Admitting: Internal Medicine

## 2015-04-24 ENCOUNTER — Other Ambulatory Visit: Payer: Self-pay | Admitting: Internal Medicine

## 2015-04-25 ENCOUNTER — Encounter: Payer: Self-pay | Admitting: Internal Medicine

## 2015-04-25 ENCOUNTER — Ambulatory Visit: Payer: Self-pay | Attending: Internal Medicine | Admitting: Internal Medicine

## 2015-04-25 VITALS — BP 120/87 | HR 94 | Temp 98.0°F | Resp 16 | Wt 264.0 lb

## 2015-04-25 DIAGNOSIS — I10 Essential (primary) hypertension: Secondary | ICD-10-CM | POA: Insufficient documentation

## 2015-04-25 DIAGNOSIS — M62838 Other muscle spasm: Secondary | ICD-10-CM | POA: Insufficient documentation

## 2015-04-25 DIAGNOSIS — M542 Cervicalgia: Secondary | ICD-10-CM | POA: Insufficient documentation

## 2015-04-25 LAB — COMPLETE METABOLIC PANEL WITH GFR
ALBUMIN: 3.7 g/dL (ref 3.5–5.2)
ALT: 11 U/L (ref 0–35)
AST: 12 U/L (ref 0–37)
Alkaline Phosphatase: 82 U/L (ref 39–117)
BILIRUBIN TOTAL: 0.3 mg/dL (ref 0.2–1.2)
BUN: 14 mg/dL (ref 6–23)
CO2: 31 mEq/L (ref 19–32)
Calcium: 9.9 mg/dL (ref 8.4–10.5)
Chloride: 99 mEq/L (ref 96–112)
Creat: 0.85 mg/dL (ref 0.50–1.10)
GFR, Est African American: >89 mL/min
GFR, Est Non African American: 79 mL/min
Glucose, Bld: 81 mg/dL (ref 70–99)
POTASSIUM: 4.1 meq/L (ref 3.5–5.3)
Sodium: 141 mEq/L (ref 135–145)
TOTAL PROTEIN: 7.5 g/dL (ref 6.0–8.3)

## 2015-04-25 MED ORDER — NAPROXEN 500 MG PO TABS: 500.0000 mg | ORAL_TABLET | Freq: Two times a day (BID) | ORAL | Status: DC

## 2015-04-25 MED ORDER — BACLOFEN 10 MG PO TABS: 10.0000 mg | ORAL_TABLET | Freq: Every day | ORAL | Status: DC

## 2015-04-25 NOTE — Progress Notes (Signed)
Patient here for follow up on her back pain and neck pain Patient also requesting a refill on her flexeril

## 2015-04-25 NOTE — Progress Notes (Signed)
MRN: [id number] Name: Michele Wood  Sex: female Age: 54 y.o. DOB: 1960-11-15  Allergies: Milk-related compounds and Codeine sulfate  Chief Complaint  Patient presents with  . Back Pain    HPI: Patient is 54 y.o. female who has history of hypertension, chronic neck pain back pain, muscle spasm, today she is requesting refill on muscle relaxant apparently she was prescribed baclofen and Flexeril in the past, she would prefer to use baclofen, denies any new symptoms denies any headache dizziness chest and shortness of breath, has been compliant in taking her blood pressure medications.  Past Medical History  Diagnosis Date  . Hypertension   . Obesity   . Sleep apnea   . Chronic back pain     Past Surgical History  Procedure Laterality Date  . Abdominal hysterectomy        Medication List       This list is accurate as of: 04/25/15  4:06 PM.  Always use your most recent med list.               amLODipine 10 MG tablet  Commonly known as:  NORVASC  TAKE 1 TABLET BY MOUTH ONCE DAILY     baclofen 10 MG tablet  Commonly known as:  LIORESAL  Take 1 tablet (10 mg total) by mouth at bedtime.     DULoxetine 20 MG capsule  Commonly known as:  CYMBALTA  Take 1 capsule (20 mg total) by mouth daily.     hydrochlorothiazide 25 MG tablet  Commonly known as:  HYDRODIURIL  TAKE 1 TABLET BY MOUTH ONCE DAILY(TAKE WITH LISINOPRIL 40MG )     HYDROcodone-acetaminophen 5-325 MG per tablet  Commonly known as:  NORCO/VICODIN  Take 1 tablet by mouth every 4 (four) hours as needed for moderate pain or severe pain.     influenza vac recombinant HA trivalent injection  Commonly known as:  FLUBLOK  Inject 0.5 mLs into the muscle once.     lisinopril 40 MG tablet  Commonly known as:  PRINIVIL,ZESTRIL  TAKE 1 TABLET BY MOUTH ONCE DAILY. (TAKE WITH HYDROCHLOROTHIAZIDE 25MG )     lisinopril-hydrochlorothiazide 20-12.5 MG per tablet  Commonly known as:  PRINZIDE,ZESTORETIC    Take 1 tablet by mouth daily.     naproxen 500 MG tablet  Commonly known as:  NAPROSYN  Take 1 tablet (500 mg total) by mouth 2 (two) times daily.     traMADol 50 MG tablet  Commonly known as:  ULTRAM  Take 1 tablet (50 mg total) by mouth every 6 (six) hours as needed.     Vitamin D 2000 UNITS tablet  Take 2,000 Units by mouth daily.        Meds ordered this encounter  Medications  . baclofen (LIORESAL) 10 MG tablet    Sig: Take 1 tablet (10 mg total) by mouth at bedtime.    Dispense:  30 tablet    Refill:  3  . naproxen (NAPROSYN) 500 MG tablet    Sig: Take 1 tablet (500 mg total) by mouth 2 (two) times daily.    Dispense:  60 tablet    Refill:  3    Immunization History  Administered Date(s) Administered  . Influenza Whole 09/13/2008  . Influenza,inj,Quad PF,36+ Mos 09/22/2014    Family History  Problem Relation Age of Onset  . Hypertension Other     History  Substance Use Topics  . Smoking status: Never Smoker   . Smokeless tobacco: Not on file  .  Alcohol Use: Yes     Comment: socially    Review of Systems   As noted in HPI  Filed Vitals:   04/25/15 1541  BP: 120/87  Pulse: 94  Temp: 98 F (36.7 C)  Resp: 16    Physical Exam  Physical Exam  Constitutional: No distress.  Eyes: EOM are normal. Pupils are equal, round, and reactive to light.  Cardiovascular: Normal rate and regular rhythm.   Pulmonary/Chest: Breath sounds normal. No respiratory distress. She has no wheezes.  Musculoskeletal: She exhibits no edema.    CBC    Component Value Date/Time   WBC 7.2 12/03/2013 1508   RBC 4.68 12/03/2013 1508   HGB 12.4 12/03/2013 1508   HCT 36.4 12/03/2013 1508   PLT 371 12/03/2013 1508   MCV 77.8* 12/03/2013 1508   LYMPHSABS 2.8 12/03/2013 1508   MONOABS 0.5 12/03/2013 1508   EOSABS 0.1 12/03/2013 1508   BASOSABS 0.0 12/03/2013 1508    CMP     Component Value Date/Time   NA 141 09/22/2014 1758   K 4.2 09/22/2014 1758   CL 103  09/22/2014 1758   CO2 27 09/22/2014 1758   GLUCOSE 90 09/22/2014 1758   BUN 15 09/22/2014 1758   CREATININE 0.82 09/22/2014 1758   CREATININE 0.62 01/30/2009 2310   CALCIUM 9.8 09/22/2014 1758   PROT 7.5 09/22/2014 1758   ALBUMIN 3.8 09/22/2014 1758   AST 14 09/22/2014 1758   ALT 13 09/22/2014 1758   ALKPHOS 81 09/22/2014 1758   BILITOT 0.3 09/22/2014 1758   GFRNONAA 83 09/22/2014 1758   GFRAA >89 09/22/2014 1758    Lab Results  Component Value Date/Time   CHOL 202* 10/26/2013 09:49 AM    No results found for: HGBA1C  Lab Results  Component Value Date/Time   AST 14 09/22/2014 05:58 PM    Assessment and Plan  Essential hypertension, benign - Plan: blood pressure is well controlled, continue with amlodipine, lisinopril, hydrochlorothiazide, repeat blood chemistry COMPLETE METABOLIC PANEL WITH GFR  Muscle spasm - Plan:  Advised patient to apply heating pad  baclofen (LIORESAL) 10 MG tablet, naproxen (NAPROSYN) 500 MG tablet  Cervicalgia - Plan: naproxen (NAPROSYN) 500 MG tablet  Return in about 3 months (around 07/26/2015), or if symptoms worsen or fail to improve.   This note has been created with Surveyor, quantity. Any transcriptional errors are unintentional.    Lorayne Marek, MD

## 2015-04-26 ENCOUNTER — Telehealth: Payer: Self-pay

## 2015-04-26 NOTE — Telephone Encounter (Signed)
-----   Message from Lorayne Marek, MD sent at 04/26/2015  9:31 AM EDT ----- Call and let the Patient know that blood work is normal.

## 2015-04-26 NOTE — Telephone Encounter (Signed)
Patient is aware of her lab results 

## 2015-05-25 ENCOUNTER — Ambulatory Visit: Payer: Self-pay | Attending: Internal Medicine

## 2015-06-01 ENCOUNTER — Other Ambulatory Visit: Payer: Self-pay | Admitting: Internal Medicine

## 2015-06-08 ENCOUNTER — Encounter: Payer: Self-pay | Admitting: Family Medicine

## 2015-06-08 ENCOUNTER — Ambulatory Visit: Payer: Self-pay | Attending: Family Medicine | Admitting: Family Medicine

## 2015-06-08 VITALS — BP 136/90 | HR 72 | Temp 98.1°F | Resp 16 | Ht 62.0 in | Wt 262.0 lb

## 2015-06-08 DIAGNOSIS — G8929 Other chronic pain: Secondary | ICD-10-CM | POA: Insufficient documentation

## 2015-06-08 DIAGNOSIS — M4802 Spinal stenosis, cervical region: Secondary | ICD-10-CM

## 2015-06-08 DIAGNOSIS — D509 Iron deficiency anemia, unspecified: Secondary | ICD-10-CM

## 2015-06-08 DIAGNOSIS — M47816 Spondylosis without myelopathy or radiculopathy, lumbar region: Secondary | ICD-10-CM | POA: Insufficient documentation

## 2015-06-08 DIAGNOSIS — M791 Myalgia: Secondary | ICD-10-CM

## 2015-06-08 DIAGNOSIS — M542 Cervicalgia: Secondary | ICD-10-CM | POA: Insufficient documentation

## 2015-06-08 DIAGNOSIS — R42 Dizziness and giddiness: Secondary | ICD-10-CM | POA: Insufficient documentation

## 2015-06-08 DIAGNOSIS — M7918 Myalgia, other site: Secondary | ICD-10-CM

## 2015-06-08 DIAGNOSIS — R51 Headache: Secondary | ICD-10-CM | POA: Insufficient documentation

## 2015-06-08 DIAGNOSIS — R0982 Postnasal drip: Secondary | ICD-10-CM | POA: Insufficient documentation

## 2015-06-08 DIAGNOSIS — E559 Vitamin D deficiency, unspecified: Secondary | ICD-10-CM

## 2015-06-08 DIAGNOSIS — J324 Chronic pansinusitis: Secondary | ICD-10-CM | POA: Insufficient documentation

## 2015-06-08 LAB — CBC
HCT: 35.7 % — ABNORMAL LOW (ref 36.0–46.0)
Hemoglobin: 11.7 g/dL — ABNORMAL LOW (ref 12.0–15.0)
MCH: 25.3 pg — ABNORMAL LOW (ref 26.0–34.0)
MCHC: 32.8 g/dL (ref 30.0–36.0)
MCV: 77.3 fL — AB (ref 78.0–100.0)
MPV: 11.5 fL (ref 8.6–12.4)
PLATELETS: 371 10*3/uL (ref 150–400)
RBC: 4.62 MIL/uL (ref 3.87–5.11)
RDW: 15.3 % (ref 11.5–15.5)
WBC: 8.9 10*3/uL (ref 4.0–10.5)

## 2015-06-08 MED ORDER — FLUTICASONE PROPIONATE 50 MCG/ACT NA SUSP: 2.0000 | Freq: Every day | NASAL | Status: DC

## 2015-06-08 MED ORDER — DICLOFENAC SODIUM 75 MG PO TBEC: 75.0000 mg | DELAYED_RELEASE_TABLET | Freq: Two times a day (BID) | ORAL | Status: DC

## 2015-06-08 MED ORDER — DULOXETINE HCL 30 MG PO CPEP: 30.0000 mg | ORAL_CAPSULE | Freq: Every day | ORAL | Status: DC

## 2015-06-08 NOTE — Assessment & Plan Note (Signed)
Rechecking vit D level

## 2015-06-08 NOTE — Patient Instructions (Addendum)
Mrs. Balzarini,  Thank you for coming in today. It was a pleasure meeting you. I look forward to being your primary doctor.  1. Chronic neck pain with DJD: Change from naproxen to diclofenac Increase cymbalta from 20 to 30 mg daily  Referral to PT for neck pain   2. Chronic sinusitis: There is no evidence of acute bacterial infection CT sinus flonase   3. Light headedness: Heart rate and pulse are stable on orthostatic vital signs Checking vit D level to rule out deficiency Checking CBC to rule out anemia  F/u in 1-2 with nurse for recommended flu shot, starting 06/15/15  F/u with me in 4-6 weeks to f/u chronic sinusitis  Dr. Adrian Blackwater

## 2015-06-08 NOTE — Assessment & Plan Note (Signed)
Chronic sinusitis: There is no evidence of acute bacterial infection CT sinus flonase   3. Light headedness: Heart rate and pulse are stable on orthostatic vital signs Checking vit D level to rule out deficiency Checking CBC to rule out anemia

## 2015-06-08 NOTE — Assessment & Plan Note (Signed)
Chronic neck pain with DJD: Change from naproxen to diclofenac Increase cymbalta from 20 to 30 mg daily  Referral to PT for neck pain

## 2015-06-08 NOTE — Progress Notes (Signed)
Subjective:    Patient ID: Michele Wood, female    DOB: 03-26-1961, 54 y.o.   MRN: [id number] CC: ? Sinus infection, neck pain   HPI 54 yo F with hx of chronic sinusitis   1. Neck pain: chronic. L sided with radicular pain down arms when sleeping or writing with head down. No hx of neck trauma. No history of PT for neck DJD. Taking naproxen and cymbalta 20 mg daily.   2. Sinus pressure: R sided with pressure behind R eye. Decreased hearing in R ear. Headache. No fever or chills. Post nasal drip. Using coconut oil flushes for chronic sinusitis. Tried oral antihistamines and nasal steroids w/o relief. Denies extended course of oral steroid and antibiotics. Symptoms are chronic but worse x 1 week.   3. Lighteadness: feeling light headed with weather changes. No syncope or dizziness. No CP or palpitations. Ongoing for the past 3+ months. Getting worse.    Social History  Substance Use Topics  . Smoking status: Never Smoker   . Smokeless tobacco: Not on file  . Alcohol Use: Yes     Comment: socially   Review of Systems  Constitutional: Negative for fever and chills.  HENT: Positive for postnasal drip, sinus pressure and sneezing. Negative for facial swelling.   Eyes: Positive for visual disturbance.  Respiratory: Negative for cough and shortness of breath.   Cardiovascular: Negative for chest pain.  Gastrointestinal: Negative for abdominal pain and blood in stool.  Musculoskeletal: Positive for neck pain. Negative for back pain and arthralgias.  Skin: Negative for rash.  Allergic/Immunologic: Negative for immunocompromised state.  Neurological: Positive for light-headedness.  Hematological: Negative for adenopathy. Does not bruise/bleed easily.  Psychiatric/Behavioral: Negative for suicidal ideas and dysphoric mood. Hallucinations: sinus.      Objective:   Physical Exam  Constitutional: She is oriented to person, place, and time. She appears well-developed and  well-nourished. No distress.  HENT:  Head: Normocephalic and atraumatic.  Right Ear: Tympanic membrane, external ear and ear canal normal.  Left Ear: Tympanic membrane, external ear and ear canal normal.  Nose: Mucosal edema (R >L turbinate swelling ) present. Right sinus exhibits frontal sinus tenderness. Right sinus exhibits no maxillary sinus tenderness. Left sinus exhibits no maxillary sinus tenderness and no frontal sinus tenderness.  Mouth/Throat: Oropharynx is clear and moist and mucous membranes are normal. No oropharyngeal exudate.  Eyes: Conjunctivae and EOM are normal. Pupils are equal, round, and reactive to light.  Neck: Neck supple.  Cardiovascular: Normal rate, regular rhythm, normal heart sounds and intact distal pulses.   Pulmonary/Chest: Effort normal and breath sounds normal.  Lymphadenopathy:    She has no cervical adenopathy.  Neurological: She is alert and oriented to person, place, and time.  Skin: Skin is warm and dry. No rash noted.  Psychiatric: She has a normal mood and affect.  BP 136/90 mmHg  Pulse 72  Temp(Src) 98.1 F (36.7 C) (Oral)  Resp 16  Ht 5\' 2"  (1.575 m)  Wt 262 lb (118.842 kg)  BMI 47.91 kg/m2  SpO2 96%  LMP 09/01/2013  BP Readings from Last 3 Encounters:  06/08/15 136/90  04/25/15 120/87  11/07/14 152/98   Wt Readings from Last 3 Encounters:  06/08/15 262 lb (118.842 kg)  04/25/15 264 lb (119.75 kg)  11/07/14 271 lb 9.6 oz (123.197 kg)   Orthostatic VS obtained, reviewed, normal   MRI neck 10/05/2013:  IMPRESSION: Cervical spondylotic changes most notable C5-6 and C6-7 as detailed above.  Pan  sinus mucosal thickening with adjacent bony reactive changes consistent with changes of chronic sinusitis incompletely assessed on the present exam.   CT head 05/13/2011: 1. No evidence of intracranial abnormality. 2. Chronic pansinusitis and previous sinus surgeries.      Assessment & Plan:

## 2015-06-08 NOTE — Progress Notes (Signed)
Complaining of neck pain  Needle pain going down on both arms worsen on lt Possible sinus infection, HA. drainage no coughing  Pain scale #5 No hx tobacco

## 2015-06-09 ENCOUNTER — Telehealth: Payer: Self-pay | Admitting: *Deleted

## 2015-06-09 LAB — VITAMIN D 25 HYDROXY (VIT D DEFICIENCY, FRACTURES): Vit D, 25-Hydroxy: 18 ng/mL — ABNORMAL LOW (ref 30–100)

## 2015-06-09 NOTE — Telephone Encounter (Signed)
Medication refills at [facility]

## 2015-06-09 NOTE — Telephone Encounter (Signed)
CT Schedule at [facility] on 06/13/2015 at 1:00 arriving 15 min early Pt notified

## 2015-06-12 DIAGNOSIS — D509 Iron deficiency anemia, unspecified: Secondary | ICD-10-CM | POA: Insufficient documentation

## 2015-06-12 MED ORDER — VITAMIN D (ERGOCALCIFEROL) 1.25 MG (50000 UNIT) PO CAPS: 50000.0000 [IU] | ORAL_CAPSULE | ORAL | Status: DC

## 2015-06-12 MED ORDER — FERROUS SULFATE 325 (65 FE) MG PO TABS: 325.0000 mg | ORAL_TABLET | Freq: Two times a day (BID) | ORAL | Status: DC

## 2015-06-12 NOTE — Addendum Note (Signed)
Addended by: Boykin Nearing on: 06/12/2015 01:54 PM   Modules accepted: Orders

## 2015-06-13 ENCOUNTER — Ambulatory Visit (HOSPITAL_COMMUNITY)
Admission: RE | Admit: 2015-06-13 | Discharge: 2015-06-13 | Disposition: A | Discharge status: Home / Self Care | Payer: Self-pay | Source: Ambulatory Visit | Attending: Family Medicine | Admitting: Family Medicine

## 2015-06-13 ENCOUNTER — Encounter (HOSPITAL_COMMUNITY): Payer: Self-pay

## 2015-06-13 ENCOUNTER — Telehealth: Payer: Self-pay | Admitting: Family Medicine

## 2015-06-13 DIAGNOSIS — J3489 Other specified disorders of nose and nasal sinuses: Secondary | ICD-10-CM | POA: Insufficient documentation

## 2015-06-13 DIAGNOSIS — Z9889 Other specified postprocedural states: Secondary | ICD-10-CM | POA: Insufficient documentation

## 2015-06-13 DIAGNOSIS — M542 Cervicalgia: Secondary | ICD-10-CM | POA: Insufficient documentation

## 2015-06-13 DIAGNOSIS — G8929 Other chronic pain: Secondary | ICD-10-CM | POA: Insufficient documentation

## 2015-06-13 NOTE — Telephone Encounter (Signed)
Patient called stating that her back is hurting and would like some advice on what to do next. Please f/u

## 2015-06-14 ENCOUNTER — Other Ambulatory Visit: Payer: Self-pay | Admitting: Family Medicine

## 2015-06-14 DIAGNOSIS — J324 Chronic pansinusitis: Secondary | ICD-10-CM

## 2015-06-14 MED ORDER — OMEPRAZOLE 20 MG PO CPDR: 20.0000 mg | DELAYED_RELEASE_CAPSULE | Freq: Every day | ORAL | Status: DC

## 2015-06-14 MED ORDER — PREDNISONE 20 MG PO TABS: ORAL_TABLET | ORAL | Status: DC

## 2015-06-14 MED ORDER — AMOXICILLIN-POT CLAVULANATE 875-125 MG PO TABS: 1.0000 | ORAL_TABLET | Freq: Two times a day (BID) | ORAL | Status: DC

## 2015-06-14 NOTE — Assessment & Plan Note (Signed)
Chronic sinusitis without polyps confirmed on CT, plan for treatment with 10 days of prednisone see orders plus 3-4 weeks daily antibiotic therapy. Patient should take prednisone with PPI to prevent gastritis

## 2015-06-16 NOTE — Telephone Encounter (Signed)
-----   Message from Boykin Nearing, MD sent at 06/12/2015  1:50 PM EDT ----- Vit D deficiency and mild microcytic anemia  Take vit D x 8 weeks, Take iron

## 2015-06-16 NOTE — Telephone Encounter (Signed)
-----   Message from Boykin Nearing, MD sent at 06/14/2015  9:17 AM EDT ----- Chronic sinusitis without polyps confirmed on CT, plan for treatment with 10 days of prednisone see orders plus 3-4 weeks daily antibiotic therapy. Patient should take prednisone with PPI to prevent gastritis

## 2015-06-27 ENCOUNTER — Ambulatory Visit: Payer: Self-pay | Attending: Family Medicine

## 2015-06-27 DIAGNOSIS — M542 Cervicalgia: Secondary | ICD-10-CM | POA: Insufficient documentation

## 2015-06-27 DIAGNOSIS — M6248 Contracture of muscle, other site: Secondary | ICD-10-CM | POA: Insufficient documentation

## 2015-06-27 DIAGNOSIS — M62838 Other muscle spasm: Secondary | ICD-10-CM

## 2015-06-27 DIAGNOSIS — M436 Torticollis: Secondary | ICD-10-CM | POA: Insufficient documentation

## 2015-06-27 DIAGNOSIS — R293 Abnormal posture: Secondary | ICD-10-CM | POA: Insufficient documentation

## 2015-06-27 NOTE — Telephone Encounter (Signed)
Date of birth verified by Pt Lab and CT results given  Pt stated pickup prescription last Friday Pt Verbalized understanding

## 2015-06-27 NOTE — Therapy (Signed)
[place], [place], [address] Phone: [phone]   Fax:  [phone]  Physical Therapy Evaluation  Patient Details  Name: Michele Wood MRN: [id number] Date of Birth: 12/30/1960 Referring Provider:  Boykin Nearing, MD  Encounter Date: 06/27/2015      PT End of Session - 06/27/15 1457    Visit Number 1   Number of Visits 12   Date for PT Re-Evaluation 08/13/15   PT Start Time 0225  Pt late and had to finish FOTO   PT Stop Time 0257   PT Time Calculation (min) 32 min   Activity Tolerance Patient tolerated treatment well   Behavior During Therapy [facility] for tasks assessed/performed      Past Medical History  Diagnosis Date  . Hypertension     at age 16  . Obesity   . Sleep apnea     at age 69  . Chronic back pain     Past Surgical History  Procedure Laterality Date  . Abdominal hysterectomy      There were no vitals filed for this visit.  Visit Diagnosis:  Pain, neck - Plan: PT plan of care cert/re-cert  Abnormal posture - Plan: PT plan of care cert/re-cert  Muscle spasms of neck - Plan: PT plan of care cert/re-cert  Stiffness of neck - Plan: PT plan of care cert/re-cert      Subjective Assessment - 06/27/15 1426    Subjective She reports 2 years of neck pain with out therapy.   No injury, pain started without incident. degenerative changes.    Patient is accompained by: Family member   Pertinent History LBP chronic   Limitations --  holding grand kids and lifting , turning neck to see kids,  sweep and clean home ,  pain with bending   Diagnostic tests xrays 2014   Patient Stated Goals manage pain   Currently in Pain? Yes   Pain Score 3    Pain Location Neck   Pain Orientation Posterior;Left  >RT   Pain Descriptors / Indicators Dull;Aching   Pain Type Chronic pain   Pain Onset More than a month ago   Pain Frequency Constant  better in AM   Aggravating Factors   Bending head /neck, turning  lifting   Pain Relieving Factors ice , neck massager   Multiple Pain Sites Yes   Pain Score 3   Pain Location Back   Pain Orientation Right;Left;Posterior   Pain Descriptors / Indicators Aching   Pain Type Chronic pain   Pain Onset More than a month ago   Pain Frequency Constant            [facility] PT Assessment - 06/27/15 1420    Assessment   Medical Diagnosis chronic neck pain   Onset Date/Surgical Date --  2 years   Prior Therapy No   Precautions   Precautions None   Restrictions   Weight Bearing Restrictions No   Balance Screen   Has the patient fallen in the past 6 months No   Has the patient had a decrease in activity level because of a fear of falling?  No   Prior Function   Level of Independence Requires assistive device for independence   Cognition   Overall Cognitive Status Within Functional Limits for tasks assessed   Posture/Postural Control   Posture Comments Rounded shoulders and forward head.    ROM / Strength   AROM / PROM / Strength AROM;Strength  AROM   AROM Assessment Site Cervical   Cervical Flexion 55   Cervical Extension 20   Cervical - Right Side Bend 32   Cervical - Left Side Bend 37   Cervical - Right Rotation 64   Cervical - Left Rotation 56   Strength   Overall Strength Comments UE WNL                           PT Education - 06/27/15 1457    Education provided Yes   Education Details POC, HEP   Person(s) Educated Patient   Methods Explanation;Demonstration;Tactile cues;Verbal cues;Handout   Comprehension Returned demonstration;Verbalized understanding          PT Short Term Goals - 06/27/15 1501    PT SHORT TERM GOAL #1   Title She will be independnet with inital HEP   Time 3   Period Weeks   Status New   PT SHORT TERM GOAL #2   Title She will report pain decreased 30% or more with moveing head and neck   Time 3   Period Weeks   Status New   PT SHORT TERM GOAL #3   Title  She will improve rotation motin ot 60 degrees bilaterally to view children at play   Time 3   Period Weeks   Status New   PT SHORT TERM GOAL #4   Title She will demo awareness of good posture   Time 3   Period Weeks   Status New           PT Long Term Goals - 06/27/15 1503    PT LONG TERM GOAL #1   Title She will be independnet with all HEP issued as of last visit   Time 6   Period Weeks   Status New   PT LONG TERM GOAL #2   Title She will report pain decreaeed 50% or more with turning head and bending forward with home and recrational tasks.    Time 6   Period Weeks   Status New   PT LONG TERM GOAL #3   Title She will report she is able to lift grand kids with 50% less pain.    Time 6   Period Weeks   Status New   PT LONG TERM GOAL #4   Title Cervical rotation to 65 degrees or more and sidebending to 40 degrees or more to decrase pain   Time 6   Period Weeks   Status New               Plan - 06/27/15 1458    Clinical Impression Statement Michele Wood presents with chronic neck pain, poor posture , pain with use of UE, activity intolerance. She should improve with PT but may have ongoing pain due to degenerative changes.    Pt will benefit from skilled therapeutic intervention in order to improve on the following deficits Postural dysfunction;Pain;Decreased activity tolerance;Increased muscle spasms;Impaired UE functional use;Decreased range of motion   Rehab Potential Good   PT Frequency 2x / week   PT Duration 6 weeks   PT Treatment/Interventions ADLs/Self Care Home Management;Cryotherapy;Electrical Stimulation;Moist Heat;Ultrasound;Traction;Therapeutic exercise;Manual techniques;Taping;Dry needling;Patient/family education;Passive range of motion   PT Next Visit Plan Modalities, review postureal awareness , add band exercises   PT Home Exercise Plan Postureal exercise   Consulted and Agree with Plan of Care Patient         Problem List Patient Active  Problem  List   Diagnosis Date Noted  . Microcytic anemia 06/12/2015  . Lightheadedness 06/08/2015  . Chronic neck pain 06/08/2015  . Vitamin D deficiency 04/25/2014  . Sinusitis, chronic 08/23/2013  . HYPERTENSION 06/21/2008    Darrel Hoover PT 06/27/2015, 3:16 PM  [facility] [address] [place], [place], [address] Phone: [phone]   Fax:  [phone]

## 2015-06-27 NOTE — Patient Instructions (Signed)
Issued from cabinet cervical posture, retraction neck and scapula 2-3 reps 3-10x/day hold 2-3 sec Pt demo HEP.

## 2015-06-29 ENCOUNTER — Ambulatory Visit: Payer: Self-pay

## 2015-06-29 DIAGNOSIS — M542 Cervicalgia: Secondary | ICD-10-CM

## 2015-06-29 DIAGNOSIS — R293 Abnormal posture: Secondary | ICD-10-CM

## 2015-06-29 DIAGNOSIS — M436 Torticollis: Secondary | ICD-10-CM

## 2015-06-29 DIAGNOSIS — M62838 Other muscle spasm: Secondary | ICD-10-CM

## 2015-06-29 NOTE — Patient Instructions (Signed)
Discussed degenerative changes in neck and she may have more on LT compared to RT . Also talked about strength and posture and how this affect stress to tissue.

## 2015-06-29 NOTE — Therapy (Signed)
[place], [place], [address] Phone: [phone]   Fax:  [phone]  Physical Therapy Treatment  Patient Details  Name: Michele Wood MRN: [id number] Date of Birth: 07-Jun-1961 Referring Provider:  Boykin Nearing, MD  Encounter Date: 06/29/2015      PT End of Session - 06/29/15 1057    Visit Number 2   Number of Visits 12   Date for PT Re-Evaluation 08/13/15   PT Start Time 4401   PT Stop Time 1110   PT Time Calculation (min) 55 min   Activity Tolerance Patient tolerated treatment well   Behavior During Therapy [facility] for tasks assessed/performed      Past Medical History  Diagnosis Date  . Hypertension     at age 71  . Obesity   . Sleep apnea     at age 69  . Chronic back pain     Past Surgical History  Procedure Laterality Date  . Abdominal hysterectomy      There were no vitals filed for this visit.  Visit Diagnosis:  Pain, neck  Abnormal posture  Muscle spasms of neck  Stiffness of neck      Subjective Assessment - 06/29/15 1021    Subjective Doing Ok. Head felt a little heavy post exercise. Pain worse, maybe weather?Marland Kitchen Woke with neck spasms.   Currently in Pain? Yes   Pain Score 5                          [facility] Adult PT Treatment/Exercise - 06/29/15 1023    Modalities   Modalities Ultrasound   Moist Heat Therapy   Number Minutes Moist Heat 15 Minutes   Moist Heat Location Cervical   Ultrasound   Ultrasound Location ?Neck   Ultrasound Parameters 100% ! MHz 1.5Wcm2   Ultrasound Goals Pain   Manual Therapy   Manual Therapy Soft tissue mobilization   Soft tissue mobilization With black [place] blade to neck muscles. And with hands. LT side more tneder     Reviewed retraction neck and scapula and cervical elongation of HEP           PT Education - 06/29/15 1057    Education provided Yes   Education Details How postrure and strength  afffect neck and changes in tissue affects pain   Person(s) Educated Patient   Methods Explanation   Comprehension Verbalized understanding          PT Short Term Goals - 06/27/15 1501    PT SHORT TERM GOAL #1   Title She will be independnet with inital HEP   Time 3   Period Weeks   Status New   PT SHORT TERM GOAL #2   Title She will report pain decreased 30% or more with moveing head and neck   Time 3   Period Weeks   Status New   PT SHORT TERM GOAL #3   Title She will improve rotation motin ot 60 degrees bilaterally to view children at play   Time 3   Period Weeks   Status New   PT SHORT TERM GOAL #4   Title She will demo awareness of good posture   Time 3   Period Weeks   Status New           PT Long Term Goals - 06/27/15 1503    PT LONG TERM GOAL #1   Title She will be independnet with  all HEP issued as of last visit   Time 6   Period Weeks   Status New   PT LONG TERM GOAL #2   Title She will report pain decreaeed 50% or more with turning head and bending forward with home and recrational tasks.    Time 6   Period Weeks   Status New   PT LONG TERM GOAL #3   Title She will report she is able to lift grand kids with 50% less pain.    Time 6   Period Weeks   Status New   PT LONG TERM GOAL #4   Title Cervical rotation to 65 degrees or more and sidebending to 40 degrees or more to decrase pain   Time 6   Period Weeks   Status New               Plan - 06/29/15 1058    Clinical Impression Statement Ms Folse did well with review of posture Gillermina Phy, She felt better post treatment. Will add stab exercise next visit   PT Next Visit Plan modalities , manual , band /stab exercises   Consulted and Agree with Plan of Care Patient        Problem List Patient Active Problem List   Diagnosis Date Noted  . Microcytic anemia 06/12/2015  . Lightheadedness 06/08/2015  . Chronic neck pain 06/08/2015  . Vitamin D deficiency 04/25/2014  . Sinusitis,  chronic 08/23/2013  . HYPERTENSION 06/21/2008    Darrel Hoover PT 06/29/2015, 11:00 AM  [facility] [address] [place], [place], [address] Phone: [phone]   Fax:  [phone]

## 2015-07-03 ENCOUNTER — Ambulatory Visit: Payer: Self-pay | Admitting: Physical Therapy

## 2015-07-03 DIAGNOSIS — M542 Cervicalgia: Secondary | ICD-10-CM

## 2015-07-03 DIAGNOSIS — R293 Abnormal posture: Secondary | ICD-10-CM

## 2015-07-03 DIAGNOSIS — M436 Torticollis: Secondary | ICD-10-CM

## 2015-07-03 DIAGNOSIS — M62838 Other muscle spasm: Secondary | ICD-10-CM

## 2015-07-03 NOTE — Therapy (Signed)
[place], [place], [address] Phone: [phone]   Fax:  [phone]  Physical Therapy Treatment  Patient Details  Name: Michele Wood MRN: [id number] Date of Birth: 07-11-1961 Referring Provider:  Boykin Nearing, MD  Encounter Date: 07/03/2015      PT End of Session - 07/03/15 1250    Visit Number 3   Number of Visits 12   Date for PT Re-Evaluation 08/13/15   PT Start Time 1100   PT Stop Time 1145   PT Time Calculation (min) 45 min   Activity Tolerance Patient tolerated treatment well   Behavior During Therapy [facility] for tasks assessed/performed      Past Medical History  Diagnosis Date  . Hypertension     at age 6  . Obesity   . Sleep apnea     at age 67  . Chronic back pain     Past Surgical History  Procedure Laterality Date  . Abdominal hysterectomy      There were no vitals filed for this visit.  Visit Diagnosis:  Pain, neck  Abnormal posture  Muscle spasms of neck  Stiffness of neck      Subjective Assessment - 07/03/15 1108    Subjective "The neck is doing ok this morning" pt reports she had a couple of spasm in the neck over the weekend.    Currently in Pain? Yes   Pain Score 3    Pain Location Neck   Pain Orientation Left;Posterior   Pain Type Chronic pain   Pain Onset More than a month ago   Pain Frequency Constant   Aggravating Factors  bending head/neck, turning and lifting.                          [facility] Adult PT Treatment/Exercise - 07/03/15 1111    Neck Exercises: Supine   Other Supine Exercise deep neck flexor x10 with 5 sec hold   Shoulder Exercises: Seated   Row AROM;Strengthening;Both;15 reps;Theraband   Theraband Level (Shoulder Row) Level 2 (Red)   Shoulder Exercises: ROM/Strengthening   UBE (Upper Arm Bike) L 1 x 4 min   alternating direction every 2 min   Moist Heat Therapy   Number Minutes Moist Heat 10 Minutes   Moist Heat Location Cervical   Ultrasound   Ultrasound Location neck   Ultrasound Parameters 100%, 1 mhz, 1.5w/cm2 x 8 min   Ultrasound Goals Pain   Manual Therapy   Manual Therapy Joint mobilization;Soft tissue mobilization;Myofascial release   Joint Mobilization grade 2 cervical mobs P>A and unilaterals   Myofascial Release sub-occipital release, upper trap stretch and manual trigger point release of upper trap and levator scapulae   McConnell L upper trap inhibition taping   Neck Exercises: Stretches   Upper Trapezius Stretch 2 reps;30 seconds   Levator Stretch 2 reps;30 seconds                PT Education - 07/03/15 1250    Education provided Yes   Education Details inhibition trap taping, and updated HEP   Person(s) Educated Patient   Methods Explanation   Comprehension Verbalized understanding          PT Short Term Goals - 06/27/15 1501    PT SHORT TERM GOAL #1   Title She will be independnet with inital HEP   Time 3   Period Weeks   Status New   PT SHORT TERM  GOAL #2   Title She will report pain decreased 30% or more with moveing head and neck   Time 3   Period Weeks   Status New   PT SHORT TERM GOAL #3   Title She will improve rotation motin ot 60 degrees bilaterally to view children at play   Time 3   Period Weeks   Status New   PT SHORT TERM GOAL #4   Title She will demo awareness of good posture   Time 3   Period Weeks   Status New           PT Long Term Goals - 06/27/15 1503    PT LONG TERM GOAL #1   Title She will be independnet with all HEP issued as of last visit   Time 6   Period Weeks   Status New   PT LONG TERM GOAL #2   Title She will report pain decreaeed 50% or more with turning head and bending forward with home and recrational tasks.    Time 6   Period Weeks   Status New   PT LONG TERM GOAL #3   Title She will report she is able to lift grand kids with 50% less pain.    Time 6   Period Weeks   Status New   PT LONG  TERM GOAL #4   Title Cervical rotation to 65 degrees or more and sidebending to 40 degrees or more to decrase pain   Time 6   Period Weeks   Status New               Plan - 07/03/15 1251    Clinical Impression Statement Ms. Kercher reported that she was doing better since the last visit. She was able to perform and finish all exercises well without complaint of increasd pain or symptoms. she rpeorted the stretching of the upper trap and levator scap helped alot. educated about upper trap inhibition taping and pt agreed to give it a try.    PT Next Visit Plan modalities , manual , band /stab exercises, dry needling?   PT Home Exercise Plan upper trap and levator stretching, dry needling education   Consulted and Agree with Plan of Care Patient        Problem List Patient Active Problem List   Diagnosis Date Noted  . Microcytic anemia 06/12/2015  . Lightheadedness 06/08/2015  . Chronic neck pain 06/08/2015  . Vitamin D deficiency 04/25/2014  . Sinusitis, chronic 08/23/2013  . HYPERTENSION 06/21/2008   Starr Lake PT, DPT, LAT, ATC  07/03/2015  12:57 PM      [facility] [address] [place], [place], [address] Phone: [phone]   Fax:  [phone]

## 2015-07-03 NOTE — Patient Instructions (Signed)
   Trigger Point Dry Needling  . What is Trigger Point Dry Needling (DN)? o DN is a physical therapy technique used to treat muscle pain and dysfunction. Specifically, DN helps deactivate muscle trigger points (muscle knots).  o A thin filiform needle is used to penetrate the skin and stimulate the underlying trigger point. The goal is for a local twitch response (LTR) to occur and for the trigger point to relax. No medication of any kind is injected during the procedure.   . What Does Trigger Point Dry Needling Feel Like?  o The procedure feels different for each individual patient. Some patients report that they do not actually feel the needle enter the skin and overall the process is not painful. Very mild bleeding may occur. However, many patients feel a deep cramping in the muscle in which the needle was inserted. This is the local twitch response.   Marland Kitchen How Will I feel after the treatment? o Soreness is normal, and the onset of soreness may not occur for a few hours. Typically this soreness does not last longer than two days.  o Bruising is uncommon, however; ice can be used to decrease any possible bruising.  o In rare cases feeling tired or nauseous after the treatment is normal. In addition, your symptoms may get worse before they get better, this period will typically not last longer than 24 hours.   . What Can I do After My Treatment? o Increase your hydration by drinking more water for the next 24 hours. o You may place ice or heat on the areas treated that have become sore, however, do not use heat on inflamed or bruised areas. Heat often brings more relief post needling. o You can continue your regular activities, but vigorous activity is not recommended initially after the treatment for 24 hours. o DN is best combined with other physical therapy such as strengthening, stretching, and other therapies.   Starr Lake PT, DPT, LAT, ATC  [facility] Phone: [phone]

## 2015-07-11 ENCOUNTER — Ambulatory Visit: Payer: Self-pay | Admitting: Physical Therapy

## 2015-07-11 DIAGNOSIS — R293 Abnormal posture: Secondary | ICD-10-CM

## 2015-07-11 DIAGNOSIS — M542 Cervicalgia: Secondary | ICD-10-CM

## 2015-07-11 DIAGNOSIS — M62838 Other muscle spasm: Secondary | ICD-10-CM

## 2015-07-11 DIAGNOSIS — M436 Torticollis: Secondary | ICD-10-CM

## 2015-07-11 NOTE — Therapy (Signed)
[place], [place], [address] Phone: [phone]   Fax:  [phone]  Physical Therapy Treatment  Patient Details  Name: Michele Wood MRN: [id number] Date of Birth: 25-Jan-1961 Referring Provider:  Boykin Nearing, MD  Encounter Date: 07/11/2015      PT End of Session - 07/11/15 1150    Visit Number 4   Number of Visits 12   Date for PT Re-Evaluation 08/13/15   PT Start Time 1147   PT Stop Time 5852   PT Time Calculation (min) 48 min      Past Medical History  Diagnosis Date  . Hypertension     at age 10  . Obesity   . Sleep apnea     at age 8  . Chronic back pain     Past Surgical History  Procedure Laterality Date  . Abdominal hysterectomy      There were no vitals filed for this visit.  Visit Diagnosis:  Pain, neck  Abnormal posture  Muscle spasms of neck  Stiffness of neck      Subjective Assessment - 07/11/15 1148    Subjective Hurting a little bit today. It was a 6/10 yesterday maybe due to the rain.    Currently in Pain? Yes   Pain Score 2    Pain Location Neck   Pain Orientation Posterior   Pain Descriptors / Indicators Aching   Aggravating Factors  moving neck, lifting   Pain Relieving Factors ice, neck massager            [facility] PT Assessment - 07/11/15 0001    AROM   Cervical - Right Rotation 60   Cervical - Left Rotation 50                     [facility] Adult PT Treatment/Exercise - 07/11/15 0001    Neck Exercises: Supine   Neck Retraction 10 reps   Other Supine Exercise neck retractio with arm circles- pt reports tingling in left shoulder,    Shoulder Exercises: Seated   Other Seated Exercises seated scap squeezes 5 sec x 10   Shoulder Exercises: Standing   Row Strengthening;Both;10 reps;Theraband   Theraband Level (Shoulder Row) Level 2 (Red)   Shoulder Exercises: ROM/Strengthening   UBE (Upper Arm Bike) L 1 x 5 min    alternating direction every 2.5 min   Manual Therapy   Myofascial Release sub-occipital release, upper trap stretch and manual trigger point release of upper trap and levator scapulae   McConnell L upper trap inhibition taping   Neck Exercises: Stretches   Upper Trapezius Stretch 3 reps;30 seconds   Levator Stretch 3 reps;30 seconds                PT Education - 07/11/15 1304    Education provided Yes   Education Details Chin tuck with arm circles and horizontal abduction   Person(s) Educated Patient   Methods Explanation;Handout   Comprehension Verbalized understanding          PT Short Term Goals - 07/11/15 1247    PT SHORT TERM GOAL #1   Title She will be independnet with inital HEP   Time 3   Period Weeks   Status On-going   PT SHORT TERM GOAL #2   Title She will report pain decreased 30% or more with moveing head and neck   Time 3   Period Weeks   Status On-going   PT  SHORT TERM GOAL #3   Title She will improve rotation motin ot 60 degrees bilaterally to view children at play   Time 3   Period Weeks   Status On-going   PT SHORT TERM GOAL #4   Title She will demo awareness of good posture   Time 3   Period Weeks   Status On-going           PT Long Term Goals - 06/27/15 1503    PT LONG TERM GOAL #1   Title She will be independnet with all HEP issued as of last visit   Time 6   Period Weeks   Status New   PT LONG TERM GOAL #2   Title She will report pain decreaeed 50% or more with turning head and bending forward with home and recrational tasks.    Time 6   Period Weeks   Status New   PT LONG TERM GOAL #3   Title She will report she is able to lift grand kids with 50% less pain.    Time 6   Period Weeks   Status New   PT LONG TERM GOAL #4   Title Cervical rotation to 65 degrees or more and sidebending to 40 degrees or more to decrase pain   Time 6   Period Weeks   Status New               Plan - 07/11/15 1152    Clinical  Impression Statement Pt felt improvement for 2 days while wearing the upper trap inhibition tape. Reapplied today. Review of neck stretches. Began supine neck stab a and added to HEP.   PT Next Visit Plan modalities , manual , band /stab exercises, Pt is scheduled for 2 dry needling treatments         Problem List Patient Active Problem List   Diagnosis Date Noted  . Microcytic anemia 06/12/2015  . Lightheadedness 06/08/2015  . Chronic neck pain 06/08/2015  . Vitamin D deficiency 04/25/2014  . Sinusitis, chronic 08/23/2013  . HYPERTENSION 06/21/2008    Dorene Ar, PTA 07/11/2015, 1:08 PM  [place], [place], [address] Phone: [phone]   Fax:  [phone]

## 2015-07-11 NOTE — Patient Instructions (Signed)
Pt given Cervical Stab level 1 and asked to perform chin tuck with arm circles x 10 and horizontal abduction in pain free ROM x 10, 2 x per day

## 2015-07-13 ENCOUNTER — Ambulatory Visit: Payer: Self-pay

## 2015-07-13 DIAGNOSIS — M62838 Other muscle spasm: Secondary | ICD-10-CM

## 2015-07-13 DIAGNOSIS — M542 Cervicalgia: Secondary | ICD-10-CM

## 2015-07-13 DIAGNOSIS — R293 Abnormal posture: Secondary | ICD-10-CM

## 2015-07-13 NOTE — Therapy (Signed)
[place], [place], [address] Phone: [phone]   Fax:  [phone]  Physical Therapy Treatment  Patient Details  Name: Michele Wood MRN: [id number] Date of Birth: 03/08/1961 Referring Provider:  Boykin Nearing, MD  Encounter Date: 07/13/2015      PT End of Session - 07/13/15 1215    Visit Number 5   Number of Visits 12   Date for PT Re-Evaluation 08/13/15   PT Start Time 7741   PT Stop Time 1100   PT Time Calculation (min) 45 min   Activity Tolerance Patient tolerated treatment well   Behavior During Therapy [facility] for tasks assessed/performed      Past Medical History  Diagnosis Date  . Hypertension     at age 69  . Obesity   . Sleep apnea     at age 55  . Chronic back pain     Past Surgical History  Procedure Laterality Date  . Abdominal hysterectomy      There were no vitals filed for this visit.  Visit Diagnosis:  Abnormal posture  Muscle spasms of neck  Pain, neck      Subjective Assessment - 07/13/15 1028    Subjective No  HA, Sinus face pain, Doing HEP, 2/10 pain, likes tape   Currently in Pain? Yes   Pain Score 2    Pain Location Neck   Pain Orientation Right   Pain Descriptors / Indicators Aching   Pain Type Chronic pain   Pain Onset More than a month ago   Pain Frequency Constant   Aggravating Factors  lifting , moving neck   Pain Relieving Factors massage , cold                         [facility] Adult PT Treatment/Exercise - 07/13/15 1025    Neck Exercises: Supine   Neck Retraction 10 reps   Capital Flexion 10 reps   Other Supine Exercise Above neck position with yellow band pull ER/Hor Abduction and extension  shoulders   Shoulder Exercises: ROM/Strengthening   UBE (Upper Arm Bike) L 1 x 5 min    Moist Heat Therapy   Number Minutes Moist Heat 15 Minutes   Moist Heat Location Cervical   Manual Therapy   Manual Therapy Manual  Traction   Joint Mobilization grade 3 cervical mobs P/A and unilateral most in upper cervical but also to lower, Lateral glides upper cervical LT and RT.    Soft tissue mobilization --   Myofascial Release sub-occipital release, upper trap stretch and manual trigger point release of upper trap and levator scapulae   Manual Traction 50 reps with 5-10 sec holds   McConnell --                  PT Short Term Goals - 07/11/15 1247    PT SHORT TERM GOAL #1   Title She will be independnet with inital HEP   Time 3   Period Weeks   Status On-going   PT SHORT TERM GOAL #2   Title She will report pain decreased 30% or more with moveing head and neck   Time 3   Period Weeks   Status On-going   PT SHORT TERM GOAL #3   Title She will improve rotation motin ot 60 degrees bilaterally to view children at play   Time 3   Period Weeks   Status On-going   PT  SHORT TERM GOAL #4   Title She will demo awareness of good posture   Time 3   Period Weeks   Status On-going           PT Long Term Goals - 06/27/15 1503    PT LONG TERM GOAL #1   Title She will be independnet with all HEP issued as of last visit   Time 6   Period Weeks   Status New   PT LONG TERM GOAL #2   Title She will report pain decreaeed 50% or more with turning head and bending forward with home and recrational tasks.    Time 6   Period Weeks   Status New   PT LONG TERM GOAL #3   Title She will report she is able to lift grand kids with 50% less pain.    Time 6   Period Weeks   Status New   PT LONG TERM GOAL #4   Title Cervical rotation to 65 degrees or more and sidebending to 40 degrees or more to decrase pain   Time 6   Period Weeks   Status New               Plan - 07/13/15 1216    Clinical Impression Statement She appears to be doing better with less pain .Passively her ROM is normal but sh had lower cervical pain at end range. She wa able to do cervical /axial elongation well.    PT Next  Visit Plan modalities , manual , band /stab exercises, Pt is scheduled for 2 dry needling treatments    Consulted and Agree with Plan of Care Patient        Problem List Patient Active Problem List   Diagnosis Date Noted  . Microcytic anemia 06/12/2015  . Lightheadedness 06/08/2015  . Chronic neck pain 06/08/2015  . Vitamin D deficiency 04/25/2014  . Sinusitis, chronic 08/23/2013  . HYPERTENSION 06/21/2008    Darrel Hoover PT 07/13/2015, 12:18 PM  [facility] [address] [place], [place], [address] Phone: [phone]   Fax:  [phone]

## 2015-07-18 ENCOUNTER — Ambulatory Visit: Payer: Self-pay | Attending: Family Medicine | Admitting: Physical Therapy

## 2015-07-18 DIAGNOSIS — M436 Torticollis: Secondary | ICD-10-CM | POA: Insufficient documentation

## 2015-07-18 DIAGNOSIS — M542 Cervicalgia: Secondary | ICD-10-CM | POA: Insufficient documentation

## 2015-07-18 DIAGNOSIS — M62838 Other muscle spasm: Secondary | ICD-10-CM

## 2015-07-18 DIAGNOSIS — M6248 Contracture of muscle, other site: Secondary | ICD-10-CM | POA: Insufficient documentation

## 2015-07-18 DIAGNOSIS — R293 Abnormal posture: Secondary | ICD-10-CM | POA: Insufficient documentation

## 2015-07-18 NOTE — Patient Instructions (Signed)

## 2015-07-19 NOTE — Therapy (Signed)
[place], [place], [address] Phone: [phone]   Fax:  [phone]  Physical Therapy Treatment  Patient Details  Name: Michele Wood MRN: [id number] Date of Birth: 1960-11-11 Referring Provider:  Boykin Nearing, MD  Encounter Date: 07/18/2015      PT End of Session - 07/19/15 1159    Visit Number 6   Number of Visits 12   Date for PT Re-Evaluation 08/13/15   PT Start Time 0867   PT Stop Time 1110   PT Time Calculation (min) 55 min   Activity Tolerance Patient tolerated treatment well      Past Medical History  Diagnosis Date  . Hypertension     at age 76  . Obesity   . Sleep apnea     at age 3  . Chronic back pain     Past Surgical History  Procedure Laterality Date  . Abdominal hysterectomy      There were no vitals filed for this visit.  Visit Diagnosis:  Abnormal posture  Muscle spasms of neck  Pain, neck  Stiffness of neck      Subjective Assessment - 07/18/15 1015    Subjective 6/10.  Had a hard time putting her new bra on today and that stirred up her pain;  right scapular pain.  Stretches have helped.     Currently in Pain? Yes   Pain Score 6    Pain Location Neck   Pain Orientation Right   Aggravating Factors  pulling with red band rows   Pain Relieving Factors stretches                         [facility] Adult PT Treatment/Exercise - 07/18/15 1055    Moist Heat Therapy   Number Minutes Moist Heat 13 Minutes   Moist Heat Location Cervical   Manual Therapy   Manual Therapy Scapular mobilization   Soft tissue mobilization upper traps, rhomoids, suboccipitals   Scapular Mobilization distraction, medial and lateral glides   Manual Traction 3x 30 sec          Trigger Point Dry Needling - 07/19/15 1158    Consent Given? Yes   Muscles Treated Upper Body Upper trapezius;Suboccipitals muscle group;Levator scapulae;Rhomboids   Upper  Trapezius Response Twitch reponse elicited;Palpable increased muscle length   SubOccipitals Response Palpable increased muscle length   Levator Scapulae Response Twitch response elicited;Palpable increased muscle length   Rhomboids Response Palpable increased muscle length     Right rhomboid only;  Other muscles Bilaterally         PT Education - 07/19/15 1159    Education provided Yes   Education Details dry needling after care   Person(s) Educated Patient   Methods Explanation;Handout   Comprehension Verbalized understanding          PT Short Term Goals - 07/19/15 1202    PT SHORT TERM GOAL #1   Title She will be independnet with inital HEP   Time 3   Period Weeks   Status On-going   PT SHORT TERM GOAL #2   Title She will report pain decreased 30% or more with moveing head and neck   Time 3   Period Weeks   Status On-going   PT SHORT TERM GOAL #3   Title She will improve rotation motin ot 60 degrees bilaterally to view children at play   Time 3   Period Weeks   Status  On-going   PT SHORT TERM GOAL #4   Title She will demo awareness of good posture   Status Achieved           PT Long Term Goals - 07/19/15 1203    PT LONG TERM GOAL #1   Title She will be independnet with all HEP issued as of last visit   Time 6   Period Weeks   Status On-going   PT LONG TERM GOAL #2   Title She will report pain decreaeed 50% or more with turning head and bending forward with home and recrational tasks.    Time 6   Period Weeks   Status On-going   PT LONG TERM GOAL #3   Title She will report she is able to lift grand kids with 50% less pain.    Time 6   Period Weeks   Status On-going   PT LONG TERM GOAL #4   Title Cervical rotation to 65 degrees or more and sidebending to 40 degrees or more to decrase pain   Time 6   Period Weeks   Status On-going               Plan - 07/19/15 1159    Clinical Impression Statement Patient reports continued 6/10 neck and  interscapular pain especially after getting dressed this morning.  She is receptive to dry needling to treat her myofascial pain and responded with with improved muscle length following to aid with manual therapy interventions.  Therapist closely monitoring response throughout.   PT Next Visit Plan assess response to dry needling; add  supine band exercises        Problem List Patient Active Problem List   Diagnosis Date Noted  . Microcytic anemia 06/12/2015  . Lightheadedness 06/08/2015  . Chronic neck pain 06/08/2015  . Vitamin D deficiency 04/25/2014  . Sinusitis, chronic 08/23/2013  . HYPERTENSION 06/21/2008    Alvera Singh 07/19/2015, 12:05 PM  [facility] [address] [place], [place], [address] Phone: [phone]   Fax:  [place], PT 07/19/2015 12:06 PM Phone: [phone] Fax: [phone]

## 2015-07-20 ENCOUNTER — Ambulatory Visit: Payer: Self-pay | Admitting: Physical Therapy

## 2015-07-20 DIAGNOSIS — M542 Cervicalgia: Secondary | ICD-10-CM

## 2015-07-20 DIAGNOSIS — R293 Abnormal posture: Secondary | ICD-10-CM

## 2015-07-20 DIAGNOSIS — M62838 Other muscle spasm: Secondary | ICD-10-CM

## 2015-07-20 DIAGNOSIS — M436 Torticollis: Secondary | ICD-10-CM

## 2015-07-20 NOTE — Therapy (Signed)
[place], [place], [address] Phone: [phone]   Fax:  [phone]  Physical Therapy Treatment  Patient Details  Name: Michele Wood MRN: [id number] Date of Birth: 08-07-1961 Referring Provider:  Boykin Nearing, MD  Encounter Date: 07/20/2015      PT End of Session - 07/20/15 1257    Visit Number 7   Number of Visits 12   Date for PT Re-Evaluation 08/13/15   PT Start Time 1147   PT Stop Time 6962   PT Time Calculation (min) 48 min      Past Medical History  Diagnosis Date  . Hypertension     at age 82  . Obesity   . Sleep apnea     at age 23  . Chronic back pain     Past Surgical History  Procedure Laterality Date  . Abdominal hysterectomy      There were no vitals filed for this visit.  Visit Diagnosis:  Abnormal posture  Muscle spasms of neck  Pain, neck  Stiffness of neck      Subjective Assessment - 07/20/15 1151    Subjective The needling helped relief my pain on right side. The left side is still painful.    Currently in Pain? Yes   Pain Score 3    Pain Location Neck   Pain Orientation Left   Pain Descriptors / Indicators Aching   Pain Radiating Towards left shoulder                          [facility] Adult PT Treatment/Exercise - 07/20/15 0001    Neck Exercises: Supine   Neck Retraction 10 reps   Other Supine Exercise Above neck position with yellow band pull ER/Hor Abduction and extension  shoulders   Shoulder Exercises: ROM/Strengthening   UBE (Upper Arm Bike) L2 3 min forward, 3 mi back  c/o increased N/T   Modalities   Modalities Traction   Moist Heat Therapy   Number Minutes Moist Heat 15 Minutes   Traction   Type of Traction Cervical   Min (lbs) 7   Max (lbs) 14   Hold Time 60   Rest Time 15   Time 15                 PT Short Term Goals - 07/19/15 1202    PT SHORT TERM GOAL #1   Title She will be independnet  with inital HEP   Time 3   Period Weeks   Status On-going   PT SHORT TERM GOAL #2   Title She will report pain decreased 30% or more with moveing head and neck   Time 3   Period Weeks   Status On-going   PT SHORT TERM GOAL #3   Title She will improve rotation motin ot 60 degrees bilaterally to view children at play   Time 3   Period Weeks   Status On-going   PT SHORT TERM GOAL #4   Title She will demo awareness of good posture   Status Achieved           PT Long Term Goals - 07/19/15 1203    PT LONG TERM GOAL #1   Title She will be independnet with all HEP issued as of last visit   Time 6   Period Weeks   Status On-going   PT LONG TERM GOAL #2   Title She will report  pain decreaeed 50% or more with turning head and bending forward with home and recrational tasks.    Time 6   Period Weeks   Status On-going   PT LONG TERM GOAL #3   Title She will report she is able to lift grand kids with 50% less pain.    Time 6   Period Weeks   Status On-going   PT LONG TERM GOAL #4   Title Cervical rotation to 65 degrees or more and sidebending to 40 degrees or more to decrase pain   Time 6   Period Weeks   Status On-going               Plan - 07/20/15 1309    Clinical Impression Statement Pt reports improvement with dry needing session yesterday. She would like to try it again with concentration on her left side. Adjusted pt's schedule to allow for TPDN next week. Trial of cerical traction today due to pt's continued reports of radicular symptoms. Pt reports increased pain due to pressure on suboccipitals during  traction. Pt instructed to be mindful of her symptoms and let us know if helpful next visit.    PT Next Visit Plan assess response to traction, review supine band and add to HEP, continue dry needling as scheduled: check ROM/goals        Problem List Patient Active Problem List   Diagnosis Date Noted  . Microcytic anemia 06/12/2015  . Lightheadedness  06/08/2015  . Chronic neck pain 06/08/2015  . Vitamin D deficiency 04/25/2014  . Sinusitis, chronic 08/23/2013  . HYPERTENSION 06/21/2008    Dorene Ar, PTA 07/20/2015, 1:18 PM  [place], [place], [address] Phone: [phone]   Fax:  [phone]

## 2015-07-25 ENCOUNTER — Ambulatory Visit: Payer: Self-pay

## 2015-07-25 DIAGNOSIS — M436 Torticollis: Secondary | ICD-10-CM

## 2015-07-25 DIAGNOSIS — M542 Cervicalgia: Secondary | ICD-10-CM

## 2015-07-25 DIAGNOSIS — M62838 Other muscle spasm: Secondary | ICD-10-CM

## 2015-07-25 DIAGNOSIS — R293 Abnormal posture: Secondary | ICD-10-CM

## 2015-07-25 NOTE — Therapy (Signed)
[place], [place], [address] Phone: [phone]   Fax:  [phone]  Physical Therapy Treatment  Patient Details  Name: Michele Wood MRN: [id number] Date of Birth: May 23, 1961 Referring Provider:  Boykin Nearing, MD  Encounter Date: 07/25/2015      PT End of Session - 07/25/15 1049    Visit Number 8   Number of Visits 12   Date for PT Re-Evaluation 08/13/15   PT Start Time 1015   PT Stop Time 1115   PT Time Calculation (min) 60 min   Activity Tolerance Patient tolerated treatment well   Behavior During Therapy [facility] for tasks assessed/performed      Past Medical History  Diagnosis Date  . Hypertension     at age 40  . Obesity   . Sleep apnea     at age 10  . Chronic back pain     Past Surgical History  Procedure Laterality Date  . Abdominal hysterectomy      There were no vitals filed for this visit.  Visit Diagnosis:  Abnormal posture  Muscle spasms of neck  Pain, neck  Stiffness of neck      Subjective Assessment - 07/25/15 1032    Subjective No changes in pain but symptoms in arm not there.                         [facility] Adult PT Treatment/Exercise - 07/25/15 0001    Neck Exercises: Supine   Neck Retraction 10 reps   Neck Retraction Limitations 10 sec each   Capital Flexion 10 reps   Moist Heat Therapy   Number Minutes Moist Heat --   Moist Heat Location --   Traction   Type of Traction Cervical   Min (lbs) 7   Max (lbs) 15   Hold Time --  static   Rest Time --  static   Time 15   Manual Therapy   Joint Mobilization grade 3 cervical mobs P/A and unilateral most in upper cervical but also to lower, Lateral glides upper cervical LT and RT.    Soft tissue mobilization suboccipitals and paraspinals   Myofascial Release sub-occipital release, upper trap stretch and manual trigger point release of upper trap and levator scapulae   Manual  Traction 3x 30 sec                  PT Short Term Goals - 07/25/15 1051    PT SHORT TERM GOAL #1   Title She will be independent with inital HEP   Status Achieved   PT SHORT TERM GOAL #2   Title She will report pain decreased 30% or more with moveing head and neck   Status On-going   PT SHORT TERM GOAL #3   Title She will improve rotation motin ot 60 degrees bilaterally to view children at play   Baseline passively she is WNL   Status On-going   PT SHORT TERM GOAL #4   Title She will demo awareness of good posture   Status Achieved           PT Long Term Goals - 07/25/15 1051    PT LONG TERM GOAL #1   Title She will be independnet with all HEP issued as of last visit   Status On-going   PT LONG TERM GOAL #2   Title She will report pain decreaeed 50% or more with turning head  and bending forward with home and recrational tasks.    Status On-going   PT LONG TERM GOAL #3   Title She will report she is able to lift grand kids with 50% less pain.    Status On-going   PT LONG TERM GOAL #4   Title Cervical rotation to 65 degrees or more and sidebending to 40 degrees or more to decrase pain   Status On-going               Plan - 07/25/15 1049    Clinical Impression Statement It appears she is improved with symptoms into her LT arm though pain levels not changed. Will give 2-3 more traction sessions and dry needle as available. She had some neckk soreness at end of traction but reported feeling pretty good after getting up form table   PT Next Visit Plan assess response to traction, review supine band and add to HEP, continue dry needling as scheduled: check ROM/goals, Cont manual    Consulted and Agree with Plan of Care Patient        Problem List Patient Active Problem List   Diagnosis Date Noted  . Microcytic anemia 06/12/2015  . Lightheadedness 06/08/2015  . Chronic neck pain 06/08/2015  . Vitamin D deficiency 04/25/2014  . Sinusitis, chronic  08/23/2013  . HYPERTENSION 06/21/2008    Darrel Hoover PT 07/25/2015, 11:39 AM  [facility] [address] [place], [place], [address] Phone: [phone]   Fax:  [phone]

## 2015-07-26 ENCOUNTER — Ambulatory Visit: Payer: Self-pay | Admitting: Physical Therapy

## 2015-07-26 DIAGNOSIS — M436 Torticollis: Secondary | ICD-10-CM

## 2015-07-26 DIAGNOSIS — R293 Abnormal posture: Secondary | ICD-10-CM

## 2015-07-26 DIAGNOSIS — M62838 Other muscle spasm: Secondary | ICD-10-CM

## 2015-07-26 DIAGNOSIS — M542 Cervicalgia: Secondary | ICD-10-CM

## 2015-07-26 NOTE — Therapy (Signed)
[place], [place], [address] Phone: [phone]   Fax:  [phone]  Physical Therapy Treatment  Patient Details  Name: Michele Wood MRN: [id number] Date of Birth: 1961-03-28 Referring Provider:  Boykin Nearing, MD  Encounter Date: 07/26/2015      PT End of Session - 07/26/15 1419    Visit Number 9   Number of Visits 12   Date for PT Re-Evaluation 08/13/15   PT Start Time 1330   PT Stop Time 1425   PT Time Calculation (min) 55 min   Activity Tolerance Patient tolerated treatment well   Behavior During Therapy [facility] for tasks assessed/performed      Past Medical History  Diagnosis Date  . Hypertension     at age 69  . Obesity   . Sleep apnea     at age 52  . Chronic back pain     Past Surgical History  Procedure Laterality Date  . Abdominal hysterectomy      There were no vitals filed for this visit.  Visit Diagnosis:  Abnormal posture  Muscle spasms of neck  Pain, neck  Stiffness of neck      Subjective Assessment - 07/26/15 1337    Subjective pt reports having some intermittent tingling in the hands but it hasn't been to bad.    Currently in Pain? Yes   Pain Score 4    Pain Location Neck   Pain Orientation Right;Left   Pain Descriptors / Indicators Aching   Pain Type Chronic pain   Pain Onset More than a month ago   Pain Frequency Constant   Aggravating Factors  rowing exercise, lifting/ opening jars   Pain Relieving Factors stretching, needling                         [facility] Adult PT Treatment/Exercise - 07/26/15 0001    Neck Exercises: Supine   Neck Retraction 10 reps;5 secs   Capital Flexion 10 reps   Moist Heat Therapy   Number Minutes Moist Heat 15 Minutes   Moist Heat Location Cervical  in supine   Manual Therapy   Manual Therapy Soft tissue mobilization   Joint Mobilization grade 3 cervical mobs P/A and unilateral most in upper  cervical but also to lower, Lateral glides upper cervical LT and RT.    Soft tissue mobilization suboccipitals and cervical multifidus and levator scapulae/ upper trap   Neck Exercises: Stretches   Upper Trapezius Stretch 2 reps;30 seconds   Levator Stretch 2 reps;30 seconds          Trigger Point Dry Needling - 07/26/15 1406    Consent Given? Yes   Muscles Treated Upper Body Upper trapezius;Suboccipitals muscle group   Upper Trapezius Response Twitch reponse elicited   SubOccipitals Response Palpable increased muscle length   Levator Scapulae Response Twitch response elicited;Palpable increased muscle length              PT Education - 07/26/15 1418    Education provided Yes   Education Details updated HEP   Person(s) Educated Patient   Methods Explanation   Comprehension Verbalized understanding          PT Short Term Goals - 07/25/15 1051    PT SHORT TERM GOAL #1   Title She will be independent with inital HEP   Status Achieved   PT SHORT TERM GOAL #2   Title She will report pain decreased  30% or more with moveing head and neck   Status On-going   PT SHORT TERM GOAL #3   Title She will improve rotation motin ot 60 degrees bilaterally to view children at play   Baseline passively she is WNL   Status On-going   PT SHORT TERM GOAL #4   Title She will demo awareness of good posture   Status Achieved           PT Long Term Goals - 07/25/15 1051    PT LONG TERM GOAL #1   Title She will be independnet with all HEP issued as of last visit   Status On-going   PT LONG TERM GOAL #2   Title She will report pain decreaeed 50% or more with turning head and bending forward with home and recrational tasks.    Status On-going   PT LONG TERM GOAL #3   Title She will report she is able to lift grand kids with 50% less pain.    Status On-going   PT LONG TERM GOAL #4   Title Cervical rotation to 65 degrees or more and sidebending to 40 degrees or more to decrase pain    Status On-going               Plan - 07/26/15 1419    Clinical Impression Statement Michele Wood reports that she was having some intermittent tingling in the L arm today. he provided consent for dry needling, elicted a twitch response in the L upper trap, sub-occipitals, levator scapulae and cervical multifidus. following treatmnet she reported relief folloing dry needling and instrument asisted STM and cervical mobs. She rpeorted some tingling in the R which she reported relief follwing self gapping mob with towel to the right which was provided for her HEP   PT Next Visit Plan assess response to dry needling on the L side. review supine band and add to HEP, continue dry needling as scheduled: check ROM/goals, Cont manual    PT Home Exercise Plan cervical lateral gapping mob with towel/ belt   Consulted and Agree with Plan of Care Patient        Problem List Patient Active Problem List   Diagnosis Date Noted  . Microcytic anemia 06/12/2015  . Lightheadedness 06/08/2015  . Chronic neck pain 06/08/2015  . Vitamin D deficiency 04/25/2014  . Sinusitis, chronic 08/23/2013  . HYPERTENSION 06/21/2008   Starr Lake PT, DPT, LAT, ATC  07/26/2015  2:24 PM      [place] [facility] [address] [place], [place], [address] Phone: [phone]   Fax:  [phone]

## 2015-07-26 NOTE — Patient Instructions (Addendum)
   Carlitos Bottino PT, DPT, LAT, ATC  [facility] Phone: [phone]     

## 2015-08-01 ENCOUNTER — Ambulatory Visit: Payer: Self-pay | Admitting: Physical Therapy

## 2015-08-01 DIAGNOSIS — R293 Abnormal posture: Secondary | ICD-10-CM

## 2015-08-01 DIAGNOSIS — M62838 Other muscle spasm: Secondary | ICD-10-CM

## 2015-08-01 DIAGNOSIS — M436 Torticollis: Secondary | ICD-10-CM

## 2015-08-01 DIAGNOSIS — M542 Cervicalgia: Secondary | ICD-10-CM

## 2015-08-01 NOTE — Patient Instructions (Signed)
Important to continue posture correction, UE strengthening on left and stretching to decrease further progression of degenerative process.

## 2015-08-01 NOTE — Therapy (Signed)
[place], [place], [address] Phone: [phone]   Fax:  [phone]  Physical Therapy Treatment  Patient Details  Name: Michele Wood MRN: [id number] Date of Birth: 1961-06-05 No Data Recorded  Encounter Date: 08/01/2015      PT End of Session - 08/01/15 1511    Visit Number 10   Number of Visits 12   Date for PT Re-Evaluation 08/13/15   PT Start Time 3546   PT Stop Time 1515   PT Time Calculation (min) 60 min   Activity Tolerance Patient tolerated treatment well      Past Medical History  Diagnosis Date  . Hypertension     at age 19  . Obesity   . Sleep apnea     at age 31  . Chronic back pain     Past Surgical History  Procedure Laterality Date  . Abdominal hysterectomy      There were no vitals filed for this visit.  Visit Diagnosis:  Abnormal posture  Muscle spasms of neck  Pain, neck  Stiffness of neck      Subjective Assessment - 08/01/15 1421    Subjective Still having neck and arm pain but less intense and less constant.  Painful with lifting or turning aburptly.  I don't think the traction helps much.  Overall 55% better.     Currently in Pain? Yes   Pain Score 4    Pain Location Neck   Pain Orientation Left   Pain Type Chronic pain   Pain Onset More than a month ago   Pain Frequency Intermittent   Aggravating Factors  turning abruptly, lifting;  driving;  stretching exercises;  needling helps some   Pain Relieving Factors massager and heat            [facility] PT Assessment - 08/01/15 1429    AROM   Cervical Flexion 50   Cervical Extension 30   Cervical - Right Side Bend 42   Cervical - Left Side Bend 40   Cervical - Right Rotation 50   Cervical - Left Rotation 48                     [facility] Adult PT Treatment/Exercise - 08/01/15 1506    Self-Care   Self-Care Other Self-Care Comments   Other Self-Care Comments  Discussion of the  importance of ROM and strengthening for a "progressive" degenerative condition and in relation to ROM   Moist Heat Therapy   Number Minutes Moist Heat 10 Minutes   Moist Heat Location Cervical   Manual Therapy   Joint Mobilization PA C3-C7, left lateral glides grade 3 with neural glides left UE 5x each   Manual Traction 3x 30 sec          Trigger Point Dry Needling - 08/01/15 1509    Consent Given? Yes   Muscles Treated Upper Body Upper trapezius;Suboccipitals muscle group;Levator scapulae;Rhomboids;Subscapularis   Upper Trapezius Response Twitch reponse elicited;Palpable increased muscle length   SubOccipitals Response Palpable increased muscle length   Levator Scapulae Response Twitch response elicited;Palpable increased muscle length   Rhomboids Response Palpable increased muscle length   Subscapularis Response Palpable increased muscle length      Left only.        PT Education - 08/01/15 1511    Education provided Yes   Education Details HEP/self care   Person(s) Educated Patient   Methods Explanation   Comprehension Verbalized understanding  PT Short Term Goals - 08/01/15 1517    PT SHORT TERM GOAL #1   Title She will be independent with inital HEP   Status Achieved   PT SHORT TERM GOAL #2   Title She will report pain decreased 30% or more with moveing head and neck   Status Achieved   PT SHORT TERM GOAL #3   Title She will improve rotation motin ot 60 degrees bilaterally to view children at play   Time 3   Status Partially Met   PT SHORT TERM GOAL #4   Title She will demo awareness of good posture   Status Achieved           PT Long Term Goals - 08/01/15 1517    PT LONG TERM GOAL #1   Title She will be independnet with all HEP issued as of last visit   Time 6   Period Weeks   Status On-going   PT LONG TERM GOAL #2   Title She will report pain decreaeed 50% or more with turning head and bending forward with home and recrational tasks.     Time 6   Period Weeks   Status On-going   PT LONG TERM GOAL #3   Title She will report she is able to lift grand kids with 50% less pain.    Time 6   Period Weeks   Status On-going   PT LONG TERM GOAL #4   Title Cervical rotation to 65 degrees or more and sidebending to 40 degrees or more to decrase pain   Time 6   Period Weeks   Status On-going               Plan - 08/01/15 1512    Clinical Impression Statement The patient reports an overall improvement at 55% with decreased left UE symptoms intensity and presence.  Partial STGs met.   Symptoms are all left sided.  Cervical AROM continues to be limited but slightly improved in majority of planes.  Recommend continuation of exercise in additional dry needling and manual therapy over the next 2 weeks, then assess for further progress toward goals to determine renewal vs. discharge from PT.     PT Next Visit Plan assess response to dry needling on the L side; deep cervical flexor strengthening and left upper quarter strengthening;  manual therapy;  hold traction        Problem List Patient Active Problem List   Diagnosis Date Noted  . Microcytic anemia 06/12/2015  . Lightheadedness 06/08/2015  . Chronic neck pain 06/08/2015  . Vitamin D deficiency 04/25/2014  . Sinusitis, chronic 08/23/2013  . HYPERTENSION 06/21/2008    Alvera Singh 08/01/2015, 3:19 PM  [facility] [address] [place], [place], [address] Phone: [phone]   Fax:  [phone]  Name: HAADIYA FROGGE MRN: [id number] Date of Birth: Mar 02, 1961   Ruben Im, PT 08/01/2015 3:19 PM Phone: [phone] Fax: [phone]

## 2015-08-14 ENCOUNTER — Ambulatory Visit: Payer: Self-pay | Admitting: Physical Therapy

## 2015-08-14 DIAGNOSIS — M542 Cervicalgia: Secondary | ICD-10-CM

## 2015-08-14 DIAGNOSIS — R293 Abnormal posture: Secondary | ICD-10-CM

## 2015-08-14 DIAGNOSIS — M436 Torticollis: Secondary | ICD-10-CM

## 2015-08-14 DIAGNOSIS — M62838 Other muscle spasm: Secondary | ICD-10-CM

## 2015-08-14 NOTE — Therapy (Signed)
[place], [place], [address] Phone: [phone]   Fax:  [phone]  Physical Therapy Treatment  Patient Details  Name: Michele Wood Deberry-Dimascio MRN: [id number] Date of Birth: May 03, 1961 No Data Recorded  Encounter Date: 08/14/2015      PT End of Session - 08/14/15 1435    Visit Number 11   Number of Visits 19   Date for PT Re-Evaluation 09/11/15   PT Start Time 1415   PT Stop Time 1455   PT Time Calculation (min) 40 min   Activity Tolerance Patient tolerated treatment well   Behavior During Therapy [facility] for tasks assessed/performed      Past Medical History  Diagnosis Date  . Hypertension     at age 80  . Obesity   . Sleep apnea     at age 68  . Chronic back pain     Past Surgical History  Procedure Laterality Date  . Abdominal hysterectomy      There were no vitals filed for this visit.  Visit Diagnosis:  Abnormal posture - Plan: PT plan of care cert/re-cert  Muscle spasms of neck - Plan: PT plan of care cert/re-cert  Pain, neck - Plan: PT plan of care cert/re-cert  Stiffness of neck - Plan: PT plan of care cert/re-cert      Subjective Assessment - 08/14/15 1423    Subjective "the arm pain is less but still have some numbness in the hands, and pins and needles in the neck" pt reported she felt the needling helped alot.    Currently in Pain? Yes   Pain Score 2    Pain Location Neck   Pain Orientation Left   Pain Frequency Intermittent   Aggravating Factors  with overtaxing and doing too much   Pain Relieving Factors massaging, stretching, heat   Pain Score 5   Pain Location Back   Pain Orientation Right;Left;Posterior   Pain Descriptors / Indicators Aching   Pain Type Chronic pain   Pain Onset More than a month ago   Pain Frequency Constant            [facility] PT Assessment - 08/14/15 0001    Observation/Other Assessments   Focus on Therapeutic Outcomes (FOTO)  68%  limited   AROM   Cervical Flexion 55   Cervical Extension 28   Cervical - Right Side Bend 40   Cervical - Left Side Bend 42   Cervical - Right Rotation 50   Cervical - Left Rotation 48                     [facility] Adult PT Treatment/Exercise - 08/14/15 0001    Moist Heat Therapy   Number Minutes Moist Heat 10 Minutes   Moist Heat Location Cervical   Manual Therapy   Joint Mobilization PA C3-C7, left lateral glides grade 3 with neural glides left UE 5x each   Myofascial Release sub-occipital release, upper trap stretch and manual trigger point release of upper trap and levator scapulae   Manual Traction 3x 30 sec                PT Education - 08/14/15 1454    Education provided Yes   Education Details HEP review, and updated POC   Person(s) Educated Patient   Methods Explanation   Comprehension Verbalized understanding          PT Short Term Goals - 08/14/15 1433    PT  SHORT TERM GOAL #1   Title She will be independent with inital HEP   Time 3   Period Weeks   Status Achieved   PT SHORT TERM GOAL #2   Title She will report pain decreased 30% or more with moveing head and neck   Time 3   Period Weeks   Status Achieved   PT SHORT TERM GOAL #3   Title She will improve rotation motin ot 60 degrees bilaterally to view children at play   Baseline passively she is WNL   Time 3   Period Weeks   Status Partially Met   PT SHORT TERM GOAL #4   Title She will demo awareness of good posture   Time 3   Period Weeks   Status Achieved           PT Long Term Goals - 08/14/15 1434    PT LONG TERM GOAL #1   Title She will be independnet with all HEP issued as of last visit   Time 6   Period Weeks   Status On-going   PT LONG TERM GOAL #2   Title She will report pain decreaeed 50% or more with turning head and bending forward with home and recrational tasks.    Time 6   Period Weeks   Status On-going   PT LONG TERM GOAL #3   Title She will report  she is able to lift grand kids with 50% less pain.    Time 6   Period Weeks   Status On-going   PT LONG TERM GOAL #4   Title Cervical rotation to 65 degrees or more and sidebending to 40 degrees or more to decrase pain   Time 6   Period Weeks   Status On-going               Plan - 08/14/15 1456    Clinical Impression Statement Johnita reports some benefit with therapy and that with the dry needling the symptoms in her hands and arms are as bad as it has been. She has demonstrated improvement with cervical mobility but continues to exhibit significant tightness noted in the sub-occiptials, upper trap and levator scapulae.  She reported relief of pain and tightness following sub-occiptial release and decrased tingling in the hands following manual traction. she would benefit form continued physical therpay to progress toward her goals.    Pt will benefit from skilled therapeutic intervention in order to improve on the following deficits Postural dysfunction;Pain;Decreased activity tolerance;Increased muscle spasms;Impaired UE functional use;Decreased range of motion   PT Frequency 2x / week   PT Duration 4 weeks   PT Treatment/Interventions ADLs/Self Care Home Management;Cryotherapy;Electrical Stimulation;Moist Heat;Ultrasound;Traction;Therapeutic exercise;Manual techniques;Taping;Dry needling;Patient/family education;Passive range of motion   PT Next Visit Plan  dry needling on the L side; deep cervical flexor strengthening and left upper quarter strengthening;  manual therapy;  hold traction   PT Home Exercise Plan HEP review, sub-occpital release with tennis balls.    Consulted and Agree with Plan of Care Patient        Problem List Patient Active Problem List   Diagnosis Date Noted  . Microcytic anemia 06/12/2015  . Lightheadedness 06/08/2015  . Chronic neck pain 06/08/2015  . Vitamin D deficiency 04/25/2014  . Sinusitis, chronic 08/23/2013  . HYPERTENSION 06/21/2008    Starr Lake PT, DPT, LAT, ATC  08/14/2015  3:01 PM      [place], [place],  [address] Phone: [phone]   Fax:  [phone]  Name: Mairin Lindsley Deberry-Mayorquin MRN: [id number] Date of Birth: [birth year]

## 2015-08-16 ENCOUNTER — Ambulatory Visit: Payer: Self-pay | Attending: Family Medicine | Admitting: Physical Therapy

## 2015-08-16 DIAGNOSIS — R293 Abnormal posture: Secondary | ICD-10-CM | POA: Insufficient documentation

## 2015-08-16 DIAGNOSIS — M6248 Contracture of muscle, other site: Secondary | ICD-10-CM | POA: Insufficient documentation

## 2015-08-16 DIAGNOSIS — M436 Torticollis: Secondary | ICD-10-CM | POA: Insufficient documentation

## 2015-08-16 DIAGNOSIS — M62838 Other muscle spasm: Secondary | ICD-10-CM

## 2015-08-16 DIAGNOSIS — M542 Cervicalgia: Secondary | ICD-10-CM | POA: Insufficient documentation

## 2015-08-16 NOTE — Therapy (Signed)
[place], [place], [address] Phone: [phone]   Fax:  [phone]  Physical Therapy Treatment  Patient Details  Name: Michele Wood MRN: [id number] Date of Birth: January 20, 1961 No Data Recorded  Encounter Date: 08/16/2015      PT End of Session - 08/16/15 1011    Visit Number 12   Number of Visits 19   Date for PT Re-Evaluation 09/11/15   PT Start Time 0930   PT Stop Time 1025   PT Time Calculation (min) 55 min   Activity Tolerance Patient tolerated treatment well   Behavior During Therapy [facility] for tasks assessed/performed      Past Medical History  Diagnosis Date  . Hypertension     at age 56  . Obesity   . Sleep apnea     at age 43  . Chronic back pain     Past Surgical History  Procedure Laterality Date  . Abdominal hysterectomy      There were no vitals filed for this visit.  Visit Diagnosis:  Abnormal posture  Muscle spasms of neck  Pain, neck  Stiffness of neck      Subjective Assessment - 08/16/15 0937    Subjective "things are going abou tthe same since the last visit"   Currently in Pain? Yes   Pain Score 2    Pain Location Neck   Pain Orientation Left   Pain Descriptors / Indicators Aching   Pain Type Chronic pain   Pain Onset More than a month ago   Pain Frequency Intermittent                         [facility] Adult PT Treatment/Exercise - 08/16/15 0001    Shoulder Exercises: Supine   Horizontal ABduction AROM;Strengthening;Both;10 reps;Theraband   Theraband Level (Shoulder Horizontal ABduction) Level 2 (Red)   Shoulder Exercises: Standing   Extension AROM;Strengthening;Both;10 reps;Theraband   Theraband Level (Shoulder Extension) Level 2 (Red)   Row AROM;Strengthening;Both;10 reps;Theraband   Theraband Level (Shoulder Row) Level 2 (Red)   Shoulder Exercises: ROM/Strengthening   UBE (Upper Arm Bike) L2 3 min forward, 3 mi back   Moist Heat Therapy   Number Minutes Moist Heat 10 Minutes   Moist Heat Location Cervical  and upper thoracic region in supine   Manual Therapy   Joint Mobilization PA C3-C7, left lateral glides grade 3 with neural glides left UE 5x each   Myofascial Release sub-occipital release, upper trap stretch and manual trigger point release of upper trap and levator scapulae   Manual Traction 3x 30 sec          Trigger Point Dry Needling - 08/16/15 1004    Consent Given? Yes   Upper Trapezius Response Twitch reponse elicited;Palpable increased muscle length   SubOccipitals Response Twitch response elicited;Palpable increased muscle length   Levator Scapulae Response Twitch response elicited;Palpable increased muscle length              PT Education - 08/16/15 1011    Education provided No          PT Short Term Goals - 08/14/15 1433    PT SHORT TERM GOAL #1   Title She will be independent with inital HEP   Time 3   Period Weeks   Status Achieved   PT SHORT TERM GOAL #2   Title She will report pain decreased 30% or more with moveing head and neck  Time 3   Period Weeks   Status Achieved   PT SHORT TERM GOAL #3   Title She will improve rotation motin ot 60 degrees bilaterally to view children at play   Baseline passively she is WNL   Time 3   Period Weeks   Status Partially Met   PT SHORT TERM GOAL #4   Title She will demo awareness of good posture   Time 3   Period Weeks   Status Achieved           PT Long Term Goals - 08/14/15 1434    PT LONG TERM GOAL #1   Title She will be independnet with all HEP issued as of last visit   Time 6   Period Weeks   Status On-going   PT LONG TERM GOAL #2   Title She will report pain decreaeed 50% or more with turning head and bending forward with home and recrational tasks.    Time 6   Period Weeks   Status On-going   PT LONG TERM GOAL #3   Title She will report she is able to lift grand kids with 50% less pain.     Time 6   Period Weeks   Status On-going   PT LONG TERM GOAL #4   Title Cervical rotation to 65 degrees or more and sidebending to 40 degrees or more to decrase pain   Time 6   Period Weeks   Status On-going               Plan - 08/16/15 1011    Clinical Impression Statement Michele Wood provided consent for dry needling of the L upper trap , levator scapulae and L sub-occipitals multiple twitches were palpable and decreased tension was noted by the pt. She was able to complete all exercises following the DNwith mild report of soreness. opted for MHP following todays session to calm down soreness.    PT Next Visit Plan  dry needling on the L side; deep cervical flexor strengthening and left upper quarter strengthening;  manual therapy;  hold traction   PT Home Exercise Plan HEP review, sub-occpital release with tennis balls.    Consulted and Agree with Plan of Care Patient        Problem List Patient Active Problem List   Diagnosis Date Noted  . Microcytic anemia 06/12/2015  . Lightheadedness 06/08/2015  . Chronic neck pain 06/08/2015  . Vitamin D deficiency 04/25/2014  . Sinusitis, chronic 08/23/2013  . HYPERTENSION 06/21/2008    Starr Lake PT, DPT, LAT, ATC  08/16/2015  10:25 AM      [facility] [address] [place], [place], [address] Phone: [phone]   Fax:  [phone]  Name: Michele Wood MRN: [id number] Date of Birth: 19-Dec-1960

## 2015-08-28 ENCOUNTER — Ambulatory Visit: Payer: Self-pay | Admitting: Physical Therapy

## 2015-08-28 DIAGNOSIS — M436 Torticollis: Secondary | ICD-10-CM

## 2015-08-28 DIAGNOSIS — R293 Abnormal posture: Secondary | ICD-10-CM

## 2015-08-28 DIAGNOSIS — M62838 Other muscle spasm: Secondary | ICD-10-CM

## 2015-08-28 DIAGNOSIS — M542 Cervicalgia: Secondary | ICD-10-CM

## 2015-08-28 NOTE — Therapy (Signed)
[place], [place], [address] Phone: [phone]   Fax:  [phone]  Physical Therapy Treatment  Patient Details  Name: Michele Wood MRN: [id number] Date of Birth: 02/23/1961 No Data Recorded  Encounter Date: 08/28/2015      PT End of Session - 08/28/15 1150    Visit Number 13   Number of Visits 19   Date for PT Re-Evaluation 09/11/15   PT Start Time 1103   PT Stop Time 1200   PT Time Calculation (min) 57 min   Activity Tolerance Patient tolerated treatment well   Behavior During Therapy [facility] for tasks assessed/performed      Past Medical History  Diagnosis Date  . Hypertension     at age 85  . Obesity   . Sleep apnea     at age 64  . Chronic back pain     Past Surgical History  Procedure Laterality Date  . Abdominal hysterectomy      There were no vitals filed for this visit.  Visit Diagnosis:  Abnormal posture  Muscle spasms of neck  Pain, neck  Stiffness of neck      Subjective Assessment - 08/28/15 1106    Subjective Hurting today, I think its the weather change.  Has a headache today.  Hard to turn my head.    Currently in Pain? Yes   Pain Score 7    Pain Location Neck   Pain Orientation Left;Lateral;Posterior   Pain Descriptors / Indicators Aching;Tightness   Pain Type Chronic pain   Pain Onset More than a month ago   Pain Frequency Intermittent            [facility] PT Assessment - 08/28/15 1149    AROM   Cervical - Right Side Bend WNL   Cervical - Left Side Bend 40   Cervical - Right Rotation WNL   Cervical - Left Rotation 45                     [facility] Adult PT Treatment/Exercise - 08/28/15 1119    Self-Care   Self-Care Posture;Other Self-Care Comments   Posture chin tuck   Other Self-Care Comments  anatomy, arthritis    Shoulder Exercises: Supine   Horizontal ABduction Strengthening;Both;10 reps;Theraband   Theraband Level  (Shoulder Horizontal ABduction) Level 1 (Yellow)   Horizontal ABduction Weight (lbs) 2 sets   with chin tuck into ball    External Rotation Strengthening;Both;20 reps;Theraband   Theraband Level (Shoulder External Rotation) Level 1 (Yellow)  with chin tuck   Flexion AROM;Both;20 reps   Theraband Level (Shoulder Flexion) --  C- stab    Other Supine Exercises small circles x 10 each dir (C-stab)   Other Supine Exercises on ball, small ROM "nods" and rotation x 1 min each    Shoulder Exercises: ROM/Strengthening   UBE (Upper Arm Bike) L2, 5 min reduced pain and sitffness post    Moist Heat Therapy   Number Minutes Moist Heat 15 Minutes   Moist Heat Location Shoulder;Cervical   Electrical Stimulation   Electrical Stimulation Location L neck and UE    Electrical Stimulation Action IFC   Electrical Stimulation Parameters to tol   Electrical Stimulation Goals Pain   Manual Therapy   Joint Mobilization PA C3-C7, left lateral glides grade 3 with neural glides left UE 5x each   Myofascial Release suboccipitals, L upper trap, levator scap   Manual Traction gentle subocc release  Neck Exercises: Stretches   Upper Trapezius Stretch 2 reps;30 seconds   Levator Stretch 2 reps;30 seconds                PT Education - 08/28/15 1150    Education provided Yes   Education Details arthritis and prevention of progression (PT), trigger points   Person(s) Educated Patient   Methods Explanation   Comprehension Verbalized understanding          PT Short Term Goals - 08/28/15 1315    PT SHORT TERM GOAL #1   Title She will be independent with inital HEP   Status Achieved   PT SHORT TERM GOAL #2   Title She will report pain decreased 30% or more with moveing head and neck   Status Achieved   PT SHORT TERM GOAL #3   Title She will improve rotation motin ot 60 degrees bilaterally to view children at play   Status Partially Met   PT SHORT TERM GOAL #4   Title She will demo awareness of  good posture   Status Achieved           PT Long Term Goals - 08/28/15 1154    PT LONG TERM GOAL #1   Title She will be independnet with all HEP issued as of last visit   Status On-going   PT LONG TERM GOAL #2   Title She will report pain decreaeed 50% or more with turning head and bending forward with home and recrational tasks.    Status On-going   PT LONG TERM GOAL #3   Title She will report she is able to lift grand kids with 50% less pain.    Status On-going   PT LONG TERM GOAL #4   Title Cervical rotation to 65 degrees or more and sidebending to 40 degrees or more to decrase pain   Status On-going               Plan - 08/28/15 1152    Clinical Impression Statement Patient with increased pain, unknown trigger.  She does admit to being limited ny recent sinus issues.  Explained to her that sometimes NOT doing enough activity can also increase stiffness and pain.  Patient was doing so miuch  better, she is progressing towards goals. She responds favorably to modalities and dry needling.     PT Next Visit Plan  dry needling on the L side; deep cervical flexor strengthening and left upper quarter strengthening;  manual therapy; progress HEP   PT Home Exercise Plan HEP review, sub-occpital release with tennis balls.    Consulted and Agree with Plan of Care Patient        Problem List Patient Active Problem List   Diagnosis Date Noted  . Microcytic anemia 06/12/2015  . Lightheadedness 06/08/2015  . Chronic neck pain 06/08/2015  . Vitamin D deficiency 04/25/2014  . Sinusitis, chronic 08/23/2013  . HYPERTENSION 06/21/2008    PAA,JENNIFER 08/28/2015, 1:24 PM  [place], [place], [address] Phone: [phone]   Fax:  [phone]  Name: Michele Wood MRN: [id number] Date of Birth: 1961-04-24   Raeford Razor, PT 08/28/2015 1:24 PM Phone: [phone] Fax: [phone]

## 2015-08-30 ENCOUNTER — Ambulatory Visit: Payer: Self-pay | Admitting: Physical Therapy

## 2015-08-30 DIAGNOSIS — M436 Torticollis: Secondary | ICD-10-CM

## 2015-08-30 DIAGNOSIS — M62838 Other muscle spasm: Secondary | ICD-10-CM

## 2015-08-30 DIAGNOSIS — M542 Cervicalgia: Secondary | ICD-10-CM

## 2015-08-30 DIAGNOSIS — R293 Abnormal posture: Secondary | ICD-10-CM

## 2015-08-30 NOTE — Patient Instructions (Signed)
Over Head Pull: Narrow Grip       On back, knees bent, feet flat, band across thighs, elbows straight but relaxed. Pull hands apart (start). Keeping elbows straight, bring arms up and over head, hands toward floor. Keep pull steady on band. Hold momentarily. Return slowly, keeping pull steady, back to start. Repeat __10_ times. Band color __R____   Side Pull: Double Arm   On back, knees bent, feet flat. Arms perpendicular to body, shoulder level, elbows straight but relaxed. Pull arms out to sides, elbows straight. Resistance band comes across collarbones, hands toward floor. Hold momentarily. Slowly return to starting position. Repeat 10__ times. Band color __R___   Sash   On back, knees bent, feet flat, left hand on left hip, right hand above left. Pull right arm DIAGONALLY (hip to shoulder) across chest. Bring right arm along head toward floor. Hold momentarily. Slowly return to starting position. Repeat 10___ times. Do with left arm. Band color __R____   Shoulder Rotation: Double Arm   On back, knees bent, feet flat, elbows tucked at sides, bent 90, hands palms up. Pull hands apart and down toward floor, keeping elbows near sides. Hold momentarily. Slowly return to starting position. Repeat _10__ times. Band color __R____    

## 2015-08-30 NOTE — Therapy (Signed)
[place], [place], [address] Phone: [phone]   Fax:  [phone]  Physical Therapy Treatment  Patient Details  Name: Michele Wood MRN: [id number] Date of Birth: 01-26-1961 No Data Recorded  Encounter Date: 08/30/2015      PT End of Session - 08/30/15 1107    Visit Number 14   Number of Visits 19   Date for PT Re-Evaluation 09/11/15   PT Start Time 1050   PT Stop Time 1142   PT Time Calculation (min) 52 min      Past Medical History  Diagnosis Date  . Hypertension     at age 16  . Obesity   . Sleep apnea     at age 26  . Chronic back pain     Past Surgical History  Procedure Laterality Date  . Abdominal hysterectomy      There were no vitals filed for this visit.  Visit Diagnosis:  Abnormal posture  Muscle spasms of neck  Pain, neck  Stiffness of neck          [facility] PT Assessment - 08/30/15 1121    AROM   Cervical - Right Side Bend WNL   Cervical - Left Side Bend 60   Cervical - Right Rotation WNL   Cervical - Left Rotation 70                     [facility] Adult PT Treatment/Exercise - 08/30/15 1138    Traction   Type of Traction Cervical   Min (lbs) 20  pt reports she could not feel it at less pull   Max (lbs) 10   Hold Time 60   Rest Time 15   Time 15                  PT Short Term Goals - 08/28/15 1315    PT SHORT TERM GOAL #1   Title She will be independent with inital HEP   Status Achieved   PT SHORT TERM GOAL #2   Title She will report pain decreased 30% or more with moveing head and neck   Status Achieved   PT SHORT TERM GOAL #3   Title She will improve rotation motin ot 60 degrees bilaterally to view children at play   Status Partially Met   PT SHORT TERM GOAL #4   Title She will demo awareness of good posture   Status Achieved           PT Long Term Goals - 08/30/15 1119    PT LONG TERM GOAL #1   Title  She will be independnet with all HEP issued as of last visit   Time 6   Period Weeks   Status On-going   PT LONG TERM GOAL #2   Title She will report pain decreaeed 50% or more with turning head and bending forward with home and recrational tasks.    Time 6   Period Weeks   Status Achieved   PT LONG TERM GOAL #3   Title She will report she is able to lift grand kids with 50% less pain.    Time 6   Period Weeks   Status Achieved   PT LONG TERM GOAL #4   Title Cervical rotation to 65 degrees or more and sidebending to 40 degrees or more to decrase pain   Time 6   Period Weeks   Status Partially Met  Plan - 08/30/15 1132    Clinical Impression Statement Pt with reports of increased pain this past weekend she attributes to an arthritis flare up. She reports overall 50% decrease in pain and decreased pain with lifting grandchildren. Her AROm has improved significantly in cervical rotation. She demonstrates intermittent radicular sx with therex and supine positioning. Another trial of cervical traction today. Pt is concerned about the last time she had traction the pull was sustained the entire treatment and she was in alot of pain afterward. Monitored treatment  throughout to ensure pt comfort today. Added supine scap stab however supine caused increased radicular symptoms. No radicular symptoms in standing so pt will perform in standing. LTG#2,#3 MET, #4 partially met.    PT Next Visit Plan Review standing scap stab, assess benefit of traction.         Problem List Patient Active Problem List   Diagnosis Date Noted  . Microcytic anemia 06/12/2015  . Lightheadedness 06/08/2015  . Chronic neck pain 06/08/2015  . Vitamin D deficiency 04/25/2014  . Sinusitis, chronic 08/23/2013  . HYPERTENSION 06/21/2008    Michele Wood, PTA 08/30/2015, 11:40 AM  [facility] [address] [place], [place],  [address] Phone: [phone]   Fax:  [phone]  Name: Michele Wood MRN: [id number] Date of Birth: 10-Jan-1961

## 2015-09-04 ENCOUNTER — Other Ambulatory Visit: Payer: Self-pay | Admitting: Internal Medicine

## 2015-09-04 ENCOUNTER — Ambulatory Visit: Payer: Self-pay | Admitting: Physical Therapy

## 2015-09-04 ENCOUNTER — Other Ambulatory Visit: Payer: Self-pay

## 2015-09-04 DIAGNOSIS — M62838 Other muscle spasm: Secondary | ICD-10-CM

## 2015-09-04 DIAGNOSIS — M436 Torticollis: Secondary | ICD-10-CM

## 2015-09-04 DIAGNOSIS — M542 Cervicalgia: Secondary | ICD-10-CM

## 2015-09-04 DIAGNOSIS — R293 Abnormal posture: Secondary | ICD-10-CM

## 2015-09-04 MED ORDER — AMLODIPINE BESYLATE 10 MG PO TABS: 10.0000 mg | ORAL_TABLET | Freq: Every day | ORAL | Status: DC

## 2015-09-04 NOTE — Telephone Encounter (Signed)
Nurse called patient, person answering telephone reports patient is busy and will return call later.  Nurse requested a call back to [employer] with [facility] and [employer].  Nurse called patient to make her aware of amlodipine being refilled for 1 month supply.  Patient needs appointment to follow up HTN, last appointment for HTN was 04/25/15. Nurse did not refill naprosyn due to visit note on 06/08/15 stating "Change from naproxen to diclofenac".

## 2015-09-04 NOTE — Therapy (Signed)
[facility] [address] [place], [place], [address] Phone: [phone]   Fax:  [phone]  Physical Therapy Treatment  Patient Details  Name: Michele Wood MRN: [id number] Date of Birth: 01/25/1961 No Data Recorded  Encounter Date: 09/04/2015      PT End of Session - 09/04/15 1141    Visit Number 15   Number of Visits 19   Date for PT Re-Evaluation 09/11/15   PT Start Time 1102   PT Stop Time 1155   PT Time Calculation (min) 53 min   Activity Tolerance Patient tolerated treatment well   Behavior During Therapy WFL for tasks assessed/performed      Past Medical History  Diagnosis Date  . Hypertension     at age 25  . Obesity   . Sleep apnea     at age 41  . Chronic back pain     Past Surgical History  Procedure Laterality Date  . Abdominal hysterectomy      There were no vitals filed for this visit.  Visit Diagnosis:  Abnormal posture  Muscle spasms of neck  Pain, neck  Stiffness of neck      Subjective Assessment - 09/04/15 1107    Subjective I'm a little better today.     Currently in Pain? Yes   Pain Score 4    Pain Location Neck   Pain Orientation Left;Lateral   Pain Descriptors / Indicators Aching   Pain Type Chronic pain   Pain Radiating Towards L arm    Pain Onset More than a month ago   Pain Frequency Intermittent   Aggravating Factors  cold weather, rain, lifting   Pain Relieving Factors stretching, heat   Pain Score 6   Pain Location Back   Pain Orientation Right;Left;Posterior   Pain Descriptors / Indicators Aching   Pain Type Chronic pain   Pain Onset More than a month ago   Pain Frequency Constant   Aggravating Factors  lifting   Pain Relieving Factors exercises, heat                         [facility] Adult PT Treatment/Exercise - 09/04/15 1114    Shoulder Exercises: Standing   Extension AROM;Strengthening;Both;10 reps;Theraband   Theraband Level  (Shoulder Extension) Level 2 (Red)   Row AROM;Strengthening;Both;10 reps;Theraband   Theraband Level (Shoulder Row) Level 2 (Red)   Other Standing Exercises horiz abd yellow x 10    Other Standing Exercises diagonal pull red x 10 each    Shoulder Exercises: ROM/Strengthening   Other ROM/Strengthening Exercises wall push ups x 10   Other ROM/Strengthening Exercises scapular protract/retract   Shoulder Exercises: Stretch   Corner Stretch 3 reps;10 seconds  add to HEP   Corner Stretch Limitations doorway, single arm    Traction   Type of Traction Cervical   Min (lbs) 15   Max (lbs) 7   Hold Time 60   Rest Time 15   Time 15        Self care: posture, prognosis, traction        PT Education - 09/04/15 1146    Education provided Yes   Education Details posture, HEP for balancing neck alignment   Person(s) Educated Patient   Methods Explanation   Comprehension Verbalized understanding          PT Short Term Goals - 08/28/15 1315    PT SHORT TERM GOAL #1   Title She   will be independent with inital HEP   Status Achieved   PT SHORT TERM GOAL #2   Title She will report pain decreased 30% or more with moveing head and neck   Status Achieved   PT SHORT TERM GOAL #3   Title She will improve rotation motin ot 60 degrees bilaterally to view children at play   Status Partially Met   PT SHORT TERM GOAL #4   Title She will demo awareness of good posture   Status Achieved           PT Long Term Goals - 08/30/15 1119    PT LONG TERM GOAL #1   Title She will be independnet with all HEP issued as of last visit   Time 6   Period Weeks   Status On-going   PT LONG TERM GOAL #2   Title She will report pain decreaeed 50% or more with turning head and bending forward with home and recrational tasks.    Time 6   Period Weeks   Status Achieved   PT LONG TERM GOAL #3   Title She will report she is able to lift grand kids with 50% less pain.    Time 6   Period Weeks   Status  Achieved   PT LONG TERM GOAL #4   Title Cervical rotation to 65 degrees or more and sidebending to 40 degrees or more to decrase pain   Time 6   Period Weeks   Status Partially Met               Plan - 09/04/15 1142    Clinical Impression Statement Patient is unsure if traction helped but it did not worsen symtpoms.  Wanted to try again today.  Pt has a few more visits in POC, may be ready to DC then.  Emphasized posture today.     PT Next Visit Plan Review standing scap stab, assess benefit of traction, help problem solve carrying 11 lbs baby and putting into crib safely (use a step stool for one foot)    PT Home Exercise Plan gave corner stretch today.    Consulted and Agree with Plan of Care Patient        Problem List Patient Active Problem List   Diagnosis Date Noted  . Microcytic anemia 06/12/2015  . Lightheadedness 06/08/2015  . Chronic neck pain 06/08/2015  . Vitamin D deficiency 04/25/2014  . Sinusitis, chronic 08/23/2013  . HYPERTENSION 06/21/2008    PAA,JENNIFER 09/04/2015, 11:47 AM  [facility] [address] [place], [place], [address] Phone: [phone]   Fax:  [phone]  Name: Lillyan L Deberry-Asby MRN: [id number] Date of Birth: 08/07/1961     

## 2015-09-04 NOTE — Patient Instructions (Signed)
Flexibility: Corner Stretch    Standing in corner with hands just above shoulder level and feet ____ inches from corner, lean forward until a comfortable stretch is felt across chest. Hold ____ seconds. Repeat ____ times per set. Do ____ sets per session. Do ____ sessions per day.  http://orth.exer.us/343   Copyright  VHI. All rights reserved.

## 2015-09-06 ENCOUNTER — Encounter: Payer: Self-pay | Admitting: Physical Therapy

## 2015-09-11 ENCOUNTER — Ambulatory Visit: Payer: Self-pay | Admitting: Physical Therapy

## 2015-09-11 DIAGNOSIS — R293 Abnormal posture: Secondary | ICD-10-CM

## 2015-09-11 DIAGNOSIS — M62838 Other muscle spasm: Secondary | ICD-10-CM

## 2015-09-11 DIAGNOSIS — M436 Torticollis: Secondary | ICD-10-CM

## 2015-09-11 DIAGNOSIS — M542 Cervicalgia: Secondary | ICD-10-CM

## 2015-09-11 NOTE — Therapy (Signed)
[place], [place], [address] Phone: [phone]   Fax:  [phone]  Physical Therapy Treatment  Patient Details  Name: Michele Wood MRN: [id number] Date of Birth: [birth year] No Data Recorded  Encounter Date: 09/11/2015      PT End of Session - 09/11/15 1210    Visit Number 16   Number of Visits 19   Date for PT Re-Evaluation 09/11/15   PT Start Time 1110   PT Stop Time 1204   PT Time Calculation (min) 54 min   Activity Tolerance Patient tolerated treatment well   Behavior During Therapy [facility] for tasks assessed/performed      Past Medical History  Diagnosis Date  . Hypertension     at age 54  . Obesity   . Sleep apnea     at age 54  . Chronic back pain     Past Surgical History  Procedure Laterality Date  . Abdominal hysterectomy      There were no vitals filed for this visit.  Visit Diagnosis:  Abnormal posture  Muscle spasms of neck  Pain, neck  Stiffness of neck      Subjective Assessment - 09/11/15 1113    Subjective Traction didn't do anything.  I'm better in how much I can turn my head and has more control over her pain.    Currently in Pain? Yes   Pain Score 4    Pain Location Neck   Pain Orientation Left   Pain Descriptors / Indicators Aching   Pain Type Chronic pain   Pain Onset More than a month ago   Pain Frequency Intermittent   Aggravating Factors  playing with the kids, cold, rain, lifting       UE strength Rt./Lt flexion (4+/5, 4/5), abduction (4+/5, 4/5)        [facility] Adult PT Treatment/Exercise - 09/11/15 1121    Shoulder Exercises: Standing   Horizontal ABduction Strengthening;Both;20 reps;Theraband   Theraband Level (Shoulder Horizontal ABduction) Level 1 (Yellow)   Retraction Strengthening;Both;10 reps   Other Standing Exercises wall push ups 2 sets x 10, no pian when in triceps position   Other Standing Exercises diagonal pull  yellow x 10 yellow   Shoulder Exercises: Stretch   Corner Stretch 3 reps;30 seconds   Corner Stretch Limitations single arm    Moist Heat Therapy   Number Minutes Moist Heat 15 Minutes   Moist Heat Location Shoulder;Cervical   Electrical Stimulation   Electrical Stimulation Location L neck and UE    Electrical Stimulation Action IFC   Electrical Stimulation Parameters to tol   Electrical Stimulation Goals Pain   Manual Therapy   Myofascial Release suboccipitals, L upper trap, levator scap   Manual Traction gentle subocc release                PT Education - 09/11/15 1210    Education provided Yes   Education Details DC and full HEP review   Person(s) Educated Patient   Methods Explanation;Demonstration   Comprehension Verbalized understanding;Returned demonstration          PT Short Term Goals - 09/11/15 1211    PT SHORT TERM GOAL #1   Title She will be independent with inital HEP   Status Achieved   PT SHORT TERM GOAL #2   Title She will report pain decreased 30% or more with moveing head and neck   Status Achieved   PT SHORT TERM GOAL #3  Title She will improve rotation motin ot 60 degrees bilaterally to view children at play   Status Achieved   PT SHORT TERM GOAL #4   Title She will demo awareness of good posture   Status Achieved           PT Long Term Goals - 09/11/15 1124    PT LONG TERM GOAL #1   Title She will be independnet with all HEP issued as of last visit   Status Achieved   PT LONG TERM GOAL #2   Title She will report pain decreaeed 50% or more with turning head and bending forward with home and recrational tasks.    Status Achieved   PT LONG TERM GOAL #3   Title She will report she is able to lift grand kids with 50% less pain.    Status Achieved   PT LONG TERM GOAL #4   Title Cervical rotation to 65 degrees or more and sidebending to 40 degrees or more to decrase pain   Status Achieved               Plan - 09/11/15 1211     Clinical Impression Statement Patient is DC and feels she can manage sx at home, cont with HEP.    PT Next Visit Plan NA   PT Home Exercise Plan full and is I    Consulted and Agree with Plan of Care Patient        Problem List Patient Active Problem List   Diagnosis Date Noted  . Microcytic anemia 06/12/2015  . Lightheadedness 06/08/2015  . Chronic neck pain 06/08/2015  . Vitamin D deficiency 04/25/2014  . Sinusitis, chronic 08/23/2013  . HYPERTENSION 06/21/2008    PAA,JENNIFER 09/11/2015, 12:15 PM  [facility] [address] [place], [place], [address] Phone: [phone]   Fax:  [phone]  Name: SABRINNA YEARWOOD MRN: [id number] Date of Birth: 02/09/1961   PHYSICAL THERAPY DISCHARGE SUMMARY  Visits from Start of Care: 16  Current functional level related to goals / functional outcomes: See above for goals met.    Remaining deficits: Min weakness,  Posture   Education / Equipment: HEP, posture, IFC, ADLs, Plan: Patient agrees to discharge.  Patient goals were met. Patient is being discharged due to meeting the stated rehab goals.  ?????   Raeford Razor, PT 09/11/2015 12:22 PM Phone: [phone] Fax: [phone]

## 2015-09-13 ENCOUNTER — Encounter: Payer: Self-pay | Admitting: Physical Therapy

## 2015-09-18 ENCOUNTER — Encounter: Payer: Self-pay | Admitting: Family Medicine

## 2015-09-18 ENCOUNTER — Ambulatory Visit: Payer: Self-pay | Attending: Family Medicine | Admitting: Family Medicine

## 2015-09-18 ENCOUNTER — Encounter: Payer: Self-pay | Admitting: Physical Therapy

## 2015-09-18 VITALS — BP 138/94 | HR 86 | Temp 98.1°F | Resp 16 | Ht 62.0 in | Wt 264.0 lb

## 2015-09-18 DIAGNOSIS — M545 Low back pain, unspecified: Secondary | ICD-10-CM | POA: Insufficient documentation

## 2015-09-18 DIAGNOSIS — M7918 Myalgia, other site: Secondary | ICD-10-CM

## 2015-09-18 DIAGNOSIS — G8929 Other chronic pain: Secondary | ICD-10-CM

## 2015-09-18 DIAGNOSIS — E559 Vitamin D deficiency, unspecified: Secondary | ICD-10-CM

## 2015-09-18 DIAGNOSIS — F419 Anxiety disorder, unspecified: Secondary | ICD-10-CM | POA: Insufficient documentation

## 2015-09-18 DIAGNOSIS — Z0000 Encounter for general adult medical examination without abnormal findings: Secondary | ICD-10-CM

## 2015-09-18 DIAGNOSIS — R0602 Shortness of breath: Secondary | ICD-10-CM

## 2015-09-18 DIAGNOSIS — M542 Cervicalgia: Secondary | ICD-10-CM

## 2015-09-18 DIAGNOSIS — I10 Essential (primary) hypertension: Secondary | ICD-10-CM | POA: Insufficient documentation

## 2015-09-18 DIAGNOSIS — M546 Pain in thoracic spine: Secondary | ICD-10-CM | POA: Insufficient documentation

## 2015-09-18 DIAGNOSIS — M791 Myalgia: Secondary | ICD-10-CM

## 2015-09-18 DIAGNOSIS — Z79899 Other long term (current) drug therapy: Secondary | ICD-10-CM | POA: Insufficient documentation

## 2015-09-18 LAB — CBC
HEMATOCRIT: 38.3 % (ref 36.0–46.0)
Hemoglobin: 13 g/dL (ref 12.0–15.0)
MCH: 25.7 pg — AB (ref 26.0–34.0)
MCHC: 33.9 g/dL (ref 30.0–36.0)
MCV: 75.7 fL — AB (ref 78.0–100.0)
MPV: 11.6 fL (ref 8.6–12.4)
PLATELETS: 333 10*3/uL (ref 150–400)
RBC: 5.06 MIL/uL (ref 3.87–5.11)
RDW: 14.6 % (ref 11.5–15.5)
WBC: 9.1 10*3/uL (ref 4.0–10.5)

## 2015-09-18 MED ORDER — CYCLOBENZAPRINE HCL 10 MG PO TABS: 10.0000 mg | ORAL_TABLET | Freq: Three times a day (TID) | ORAL | Status: DC | PRN

## 2015-09-18 MED ORDER — GABAPENTIN 300 MG PO CAPS: 300.0000 mg | ORAL_CAPSULE | Freq: Every day | ORAL | Status: DC

## 2015-09-18 MED ORDER — DICLOFENAC SODIUM 75 MG PO TBEC: 75.0000 mg | DELAYED_RELEASE_TABLET | Freq: Two times a day (BID) | ORAL | Status: DC | PRN

## 2015-09-18 MED ORDER — DULOXETINE HCL 30 MG PO CPEP: 30.0000 mg | ORAL_CAPSULE | Freq: Every day | ORAL | Status: DC

## 2015-09-18 NOTE — Assessment & Plan Note (Signed)
A: intermittent SOB with R sided chest wall pain x 5 months. No red flags P: CXR CBC

## 2015-09-18 NOTE — Patient Instructions (Signed)
Shahnaz was seen today for hypertension and back pain.  Diagnoses and all orders for this visit:  Healthcare maintenance -     Flu Vaccine QUAD 36+ mos IM -     MM DIGITAL SCREENING BILATERAL; Future  Musculoskeletal pain -     DULoxetine (CYMBALTA) 30 MG capsule; Take 1 capsule (30 mg total) by mouth daily. -     cyclobenzaprine (FLEXERIL) 10 MG tablet; Take 1 tablet (10 mg total) by mouth 3 (three) times daily as needed for muscle spasms. -     gabapentin (NEURONTIN) 300 MG capsule; Take 1 capsule (300 mg total) by mouth at bedtime. -     diclofenac (VOLTAREN) 75 MG EC tablet; Take 1 tablet (75 mg total) by mouth 2 (two) times daily as needed for moderate pain (with food).  Vitamin D deficiency -     Vitamin D, 25-hydroxy  SOB (shortness of breath) -     DG Chest 2 View; Future  Chronic bilateral low back pain without sciatica    Increase cymbalta to 30 mg from 20 mg daily Change to flexeril Add gabapentin 300 mg nightly Refilled diclofenac  O2 level stayed up during walk. CXR ordered to further evaluate R sided rib pain and shortness of breath.   F/u in 6 weeks  Dr. Adrian Blackwater

## 2015-09-18 NOTE — Progress Notes (Signed)
Patient ID: Michele Wood, female   DOB: July 18, 1961, 54 y.o.   MRN: [id number]   Subjective:  Patient ID: Michele Wood, female    DOB: 04-Nov-1960  Age: 54 y.o. MRN: [id number]  CC: Hypertension and Back Pain   HPI Michele Wood presents for   1. Chronic pain: x 3 years. Previously a Conservation officer, historic buildings. Unable to work. Applied for disability and was denied. Lifting a gallon of milk causes pain in R elbow and L arm. She is R handed. Typing causes b/l hand numbness. She has chronic neck and low back pain. PT has helped overall. cymbalta has helped she would like a dose increase. Baclofen is not helping.   2. HTN: taking Norvasc 10 mg daily. No swelling in legs, hands or feet. She has intermittent SOB. She has anxiety. She has R rib MSK pain. No CP on exertion. Chronic non smoker.   3. Intermittent SOB: x 5 months. No cough. Non smoker. No bleeding. With R sided thoracic back pain.   Social History  Substance Use Topics  . Smoking status: Never Smoker   . Smokeless tobacco: Not on file  . Alcohol Use: No     Comment: socially   Outpatient Prescriptions Prior to Visit  Medication Sig Dispense Refill  . amLODipine (NORVASC) 10 MG tablet Take 1 tablet (10 mg total) by mouth daily. 30 tablet 0  . diclofenac (VOLTAREN) 75 MG EC tablet Take 1 tablet (75 mg total) by mouth 2 (two) times daily. 30 tablet 0  . DULoxetine (CYMBALTA) 30 MG capsule Take 1 capsule (30 mg total) by mouth daily. 30 capsule 2  . ferrous sulfate 325 (65 FE) MG tablet Take 1 tablet (325 mg total) by mouth 2 (two) times daily with a meal. 60 tablet 3  . fluticasone (FLONASE) 50 MCG/ACT nasal spray Place 2 sprays into both nostrils at bedtime. 16 g 6  . omeprazole (PRILOSEC) 20 MG capsule Take 1 capsule (20 mg total) by mouth daily. 30 capsule 0  . predniSONE (DELTASONE) 20 MG tablet 20 mg by mouth twice daily for five days, followed by 20 mg daily for five days 15 tablet 0  . Vitamin D,  Ergocalciferol, (DRISDOL) 50000 UNITS CAPS capsule Take 1 capsule (50,000 Units total) by mouth every 7 (seven) days. For 8 weeks 8 capsule 0  . amoxicillin-clavulanate (AUGMENTIN) 875-125 MG per tablet Take 1 tablet by mouth 2 (two) times daily. For 4 weeks for chronic sinusitis (Patient not taking: Reported on 09/18/2015) 56 tablet 0  . baclofen (LIORESAL) 10 MG tablet Take 1 tablet (10 mg total) by mouth at bedtime. (Patient not taking: Reported on 09/18/2015) 30 tablet 3   No facility-administered medications prior to visit.   ROS Review of Systems  Constitutional: Negative for fever and chills.  Eyes: Negative for visual disturbance.  Respiratory: Positive for shortness of breath. Negative for cough.   Cardiovascular: Positive for chest pain.  Gastrointestinal: Negative for abdominal pain and blood in stool.  Musculoskeletal: Positive for myalgias, back pain, arthralgias, gait problem, neck pain and neck stiffness. Negative for joint swelling.  Skin: Negative for rash.  Allergic/Immunologic: Negative for immunocompromised state.  Neurological: Positive for numbness.  Hematological: Negative for adenopathy. Does not bruise/bleed easily.  Psychiatric/Behavioral: Negative for suicidal ideas and dysphoric mood. The patient is nervous/anxious.     Objective:  BP 138/94 mmHg  Pulse 86  Temp(Src) 98.1 F (36.7 C) (Oral)  Resp 16  Ht 5\' 2"  (1.575 m)  Wt 264 lb (119.75 kg)  BMI 48.27 kg/m2  SpO2 96%  LMP 09/01/2013 ambulatory pulse ox 97 %   BP/Weight 09/18/2015 06/08/2015 [id number]  Systolic BP 0000000 [id number]  Diastolic BP 94 90 87  Wt. (Lbs) 264 262 264  BMI 48.27 47.91 48.27   Physical Exam  Constitutional: She is oriented to person, place, and time. She appears well-developed and well-nourished. No distress.  HENT:  Head: Normocephalic and atraumatic.  Cardiovascular: Normal rate, regular rhythm, normal heart sounds and intact distal pulses.   Pulses:      Radial pulses are 2+  on the right side, and 2+ on the left side.       Dorsalis pedis pulses are 2+ on the right side, and 2+ on the left side.  Pulmonary/Chest: Effort normal and breath sounds normal.  Musculoskeletal: She exhibits tenderness. She exhibits no edema.  Back Exam: Back: Normal Curvature, no deformities or CVA tenderness  Paraspinal Tenderness: present in b/l lumbar and R thoracic   LE Strength 5/5  LE Sensation: in tact  Straight leg raise: negative    Neurological: She is alert and oriented to person, place, and time.  Skin: Skin is warm and dry. No rash noted.  Psychiatric: She has a normal mood and affect.   Depression screen [facility] 2/9 09/18/2015 06/08/2015 10/26/2013 07/01/2013  Decreased Interest 2 0 3 3  Down, Depressed, Hopeless 2 0 3 3  PHQ - 2 Score 4 0 6 6  Altered sleeping 3 - 2 3  Tired, decreased energy 2 - 2 3  Change in appetite 1 - 3 3  Feeling bad or failure about yourself  1 - 3 3  Trouble concentrating 2 - 1 3  Moving slowly or fidgety/restless 0 - 3 3  Suicidal thoughts 1 - 2 0  PHQ-9 Score 14 - 22 24    GAD 7 : Generalized Anxiety Score 09/18/2015  Nervous, Anxious, on Edge 2  Control/stop worrying 3  Worry too much - different things 3  Trouble relaxing 3  Restless 1  Easily annoyed or irritable 2  Afraid - awful might happen 1  Total GAD 7 Score 15   Assessment & Plan:   Problem List Items Addressed This Visit    Chronic low back pain without sciatica (Chronic)    A: chronic pain w/o radiculopathy  P: Add gabapentin Change baclofen to flexeril Diclofenac Increase cymbalta 30 mg daily       Chronic neck pain (Chronic)    A: chronic pain w radiculopathy  P: Add gabapentin Change baclofen to flexeril Diclofenac Increase cymbalta 30 mg daily       Relevant Medications   DULoxetine (CYMBALTA) 30 MG capsule   cyclobenzaprine (FLEXERIL) 10 MG tablet   gabapentin (NEURONTIN) 300 MG capsule   diclofenac (VOLTAREN) 75 MG EC tablet   SOB (shortness of  breath)    A: intermittent SOB with R sided chest wall pain x 5 months. No red flags P: CXR CBC       Relevant Orders   DG Chest 2 View   CBC   Vitamin D deficiency   Relevant Orders   Vitamin D, 25-hydroxy    Other Visit Diagnoses    Healthcare maintenance    -  Primary    Relevant Orders    Flu Vaccine QUAD 36+ mos IM (Completed)    MM DIGITAL SCREENING BILATERAL    Musculoskeletal pain        Relevant Medications  DULoxetine (CYMBALTA) 30 MG capsule    cyclobenzaprine (FLEXERIL) 10 MG tablet    gabapentin (NEURONTIN) 300 MG capsule    diclofenac (VOLTAREN) 75 MG EC tablet       No orders of the defined types were placed in this encounter.    Follow-up: No Follow-up on file.   Boykin Nearing MD

## 2015-09-18 NOTE — Progress Notes (Signed)
F/U HTN low back pain Pain scale #4 No tobacco user  No suicidal thought in the past two weeks

## 2015-09-18 NOTE — Assessment & Plan Note (Signed)
A: chronic pain w radiculopathy  P: Add gabapentin Change baclofen to flexeril Diclofenac Increase cymbalta 30 mg daily

## 2015-09-18 NOTE — Assessment & Plan Note (Signed)
A: chronic pain w/o radiculopathy  P: Add gabapentin Change baclofen to flexeril Diclofenac Increase cymbalta 30 mg daily

## 2015-09-19 ENCOUNTER — Ambulatory Visit (HOSPITAL_COMMUNITY)
Admission: RE | Admit: 2015-09-19 | Discharge: 2015-09-19 | Disposition: A | Discharge status: Home / Self Care | Payer: Self-pay | Source: Ambulatory Visit | Attending: Family Medicine | Admitting: Family Medicine

## 2015-09-19 DIAGNOSIS — G8929 Other chronic pain: Secondary | ICD-10-CM | POA: Insufficient documentation

## 2015-09-19 DIAGNOSIS — M549 Dorsalgia, unspecified: Secondary | ICD-10-CM | POA: Insufficient documentation

## 2015-09-19 DIAGNOSIS — M542 Cervicalgia: Secondary | ICD-10-CM | POA: Insufficient documentation

## 2015-09-19 DIAGNOSIS — R0602 Shortness of breath: Secondary | ICD-10-CM | POA: Insufficient documentation

## 2015-09-19 DIAGNOSIS — R0781 Pleurodynia: Secondary | ICD-10-CM | POA: Insufficient documentation

## 2015-09-19 LAB — VITAMIN D 25 HYDROXY (VIT D DEFICIENCY, FRACTURES): VIT D 25 HYDROXY: 22 ng/mL — AB (ref 30–100)

## 2015-09-19 MED ORDER — VITAMIN D3 50 MCG (2000 UT) PO TABS: 2000.0000 [IU] | ORAL_TABLET | Freq: Every day | ORAL | Status: AC

## 2015-09-19 NOTE — Addendum Note (Signed)
Addended by: Boykin Nearing on: 09/19/2015 09:03 AM   Modules accepted: Orders, Medications, SmartSet

## 2015-09-20 ENCOUNTER — Encounter: Payer: Self-pay | Admitting: Physical Therapy

## 2015-09-20 ENCOUNTER — Telehealth: Payer: Self-pay | Admitting: *Deleted

## 2015-09-20 NOTE — Telephone Encounter (Signed)
-----   Message from Boykin Nearing, MD sent at 09/19/2015  9:02 AM EST ----- Vit D is improved but still low at 22, take 2000 IU daily CBC normal

## 2015-09-20 NOTE — Telephone Encounter (Signed)
LVM to return call.

## 2015-09-20 NOTE — Telephone Encounter (Signed)
-----   Message from Boykin Nearing, MD sent at 09/19/2015 12:30 PM EST ----- Normal CXR

## 2015-09-25 ENCOUNTER — Encounter: Payer: Self-pay | Admitting: Physical Therapy

## 2015-09-25 NOTE — Telephone Encounter (Signed)
Date of birth verified by pt  Results given  Pt verbalized understanding

## 2015-09-27 ENCOUNTER — Encounter: Payer: Self-pay | Admitting: Physical Therapy

## 2015-10-10 ENCOUNTER — Emergency Department (HOSPITAL_COMMUNITY)
Admission: EM | Admit: 2015-10-10 | Discharge: 2015-10-10 | Disposition: A | Discharge status: Home / Self Care | Payer: Self-pay | Attending: Emergency Medicine | Admitting: Emergency Medicine

## 2015-10-10 ENCOUNTER — Encounter (HOSPITAL_COMMUNITY): Payer: Self-pay | Admitting: Emergency Medicine

## 2015-10-10 DIAGNOSIS — Z79899 Other long term (current) drug therapy: Secondary | ICD-10-CM | POA: Insufficient documentation

## 2015-10-10 DIAGNOSIS — E669 Obesity, unspecified: Secondary | ICD-10-CM | POA: Insufficient documentation

## 2015-10-10 DIAGNOSIS — I10 Essential (primary) hypertension: Secondary | ICD-10-CM | POA: Insufficient documentation

## 2015-10-10 DIAGNOSIS — J020 Streptococcal pharyngitis: Secondary | ICD-10-CM | POA: Insufficient documentation

## 2015-10-10 DIAGNOSIS — G8929 Other chronic pain: Secondary | ICD-10-CM | POA: Insufficient documentation

## 2015-10-10 DIAGNOSIS — Z7951 Long term (current) use of inhaled steroids: Secondary | ICD-10-CM | POA: Insufficient documentation

## 2015-10-10 LAB — RAPID STREP SCREEN (MED CTR MEBANE ONLY): Streptococcus, Group A Screen (Direct): POSITIVE — AB

## 2015-10-10 MED ORDER — HYDROCODONE-ACETAMINOPHEN 7.5-325 MG/15ML PO SOLN: 15.0000 mL | Freq: Four times a day (QID) | ORAL | Status: DC | PRN

## 2015-10-10 MED ORDER — PENICILLIN G BENZATHINE 1200000 UNIT/2ML IM SUSP
1.2000 10*6.[IU] | Freq: Once | INTRAMUSCULAR | Status: AC
  Administered 2015-10-10: 1.2 10*6.[IU] via INTRAMUSCULAR
  Filled 2015-10-10: qty 2

## 2015-10-10 NOTE — ED Notes (Signed)
Pt reports sore throat since yesterday accompanied by a HA and stuffy nose

## 2015-10-10 NOTE — Discharge Instructions (Signed)
Return to the ED with any concerns including difficulty breathing or swallowing, vomiting and not able to keep down liquids, decreased level of alertness/lethargy, or any other alarming symptoms °

## 2015-10-10 NOTE — ED Provider Notes (Signed)
CSN: [id number]     Arrival date & time 10/10/15  [id number] History   First MD Initiated Contact with Patient 10/10/15 0747     Chief Complaint  Patient presents with  . Sore Throat     (Consider location/radiation/quality/duration/timing/severity/associated sxs/prior Treatment) HPI  Pt presenting with c/o sore throat which began yesterday.  Pt states pain got worse overnight.  Has also had some nasal congestion.  No cough.  Subjective fever- last took motrin at 4am this morning.  Also c/o left ear pain.  Pain is worse when she swallows.  No vomiting, diarrhea or abdominal pain.  No difficulty breathing.  She has continued to drink liquids.  No specific sick contacts.  There are no other associated systemic symptoms, there are no other alleviating or modifying factors.   Past Medical History  Diagnosis Date  . Hypertension     at age 58  . Obesity   . Sleep apnea     at age 35  . Chronic back pain    Past Surgical History  Procedure Laterality Date  . Abdominal hysterectomy     Family History  Problem Relation Age of Onset  . Hypertension Other   . Hypertension Mother    Social History  Substance Use Topics  . Smoking status: Never Smoker   . Smokeless tobacco: None  . Alcohol Use: No     Comment: socially   OB History    No data available     Review of Systems  ROS reviewed and all otherwise negative except for mentioned in HPI    Allergies  Milk-related compounds and Codeine sulfate  Home Medications   Prior to Admission medications   Medication Sig Start Date End Date Taking? Authorizing Provider  amLODipine (NORVASC) 10 MG tablet Take 1 tablet (10 mg total) by mouth daily. 09/04/15   Josalyn Funches, MD  Cholecalciferol (VITAMIN D3) 2000 UNITS TABS Take 2,000 Units by mouth daily. 09/19/15   Josalyn Funches, MD  cyclobenzaprine (FLEXERIL) 10 MG tablet Take 1 tablet (10 mg total) by mouth 3 (three) times daily as needed for muscle spasms. 09/18/15   Boykin Nearing, MD  diclofenac (VOLTAREN) 75 MG EC tablet Take 1 tablet (75 mg total) by mouth 2 (two) times daily as needed for moderate pain (with food). 09/18/15   Josalyn Funches, MD  DULoxetine (CYMBALTA) 30 MG capsule Take 1 capsule (30 mg total) by mouth daily. 09/18/15   Boykin Nearing, MD  ferrous sulfate 325 (65 FE) MG tablet Take 1 tablet (325 mg total) by mouth 2 (two) times daily with a meal. 06/12/15   Josalyn Funches, MD  fluticasone (FLONASE) 50 MCG/ACT nasal spray Place 2 sprays into both nostrils at bedtime. 06/08/15   Josalyn Funches, MD  gabapentin (NEURONTIN) 300 MG capsule Take 1 capsule (300 mg total) by mouth at bedtime. 09/18/15   Josalyn Funches, MD  hydrochlorothiazide (HYDRODIURIL) 25 MG tablet TAKE 1 TABLET BY MOUTH ONCE DAILY(TAKE WITH LISINOPRIL 40MG ) 09/20/15   Boykin Nearing, MD  HYDROcodone-acetaminophen (HYCET) 7.5-325 mg/15 ml solution Take 15 mLs by mouth 4 (four) times daily as needed for moderate pain. 10/10/15 10/09/16  Alfonzo Beers, MD  lisinopril (PRINIVIL,ZESTRIL) 40 MG tablet TAKE 1 TABLET BY MOUTH ONCE DAILY ALONG WITH HYDROCHLOROTHIAZIDE 25MG  09/20/15   Boykin Nearing, MD  omeprazole (PRILOSEC) 20 MG capsule Take 1 capsule (20 mg total) by mouth daily. 06/14/15   Josalyn Funches, MD   BP 122/81 mmHg  Pulse 83  Temp(Src) 98.4 F (  36.9 C) (Oral)  Resp 16  SpO2 94%  LMP 09/01/2013  Vitals reviewed Physical Exam  Physical Examination: General appearance - alert, well appearing, and in no distress Mental status - alert, oriented to person, place, and time Eyes - no conjunctival injection, no scleral icterus Ears- TMs clear bilaterally, no erythema  Mouth - mucous membranes moist, pharynx normal without lesions, moderate erythema of OP with exudate, palate symmetric, uvula midline Neck - supple, no significant adenopathy Chest - clear to auscultation, no wheezes, rales or rhonchi, symmetric air entry Heart - normal rate, regular rhythm, normal S1, S2, no  murmurs, rubs, clicks or gallops Neurological - alert, oriented x 3, normal speech Extremities - peripheral pulses normal, no pedal edema, no clubbing or cyanosis Skin - normal coloration and turgor, no rashes  ED Course  Procedures (including critical care time) Labs Review Labs Reviewed  RAPID STREP SCREEN (NOT AT [facility]) - Abnormal; Notable for the following:    Streptococcus, Group A Screen (Direct) POSITIVE (*)    All other components within normal limits    Imaging Review No results found. I have personally reviewed and evaluated these images and lab results as part of my medical decision-making.   EKG Interpretation None      MDM   Final diagnoses:  Strep pharyngitis    Pt presenting with c/o sore throat, ear ache and congestion.  Rapid strep is positive. Palate symmetric, doubt PTA.  Pt treated with bicillin.  Ear pain is likely referred pain.  Pt also given rx for hycet for pain.   Encouraged hydration.   Patient is overall nontoxic and well hydrated in appearance.   Discharged with strict return precautions.  Pt agreeable with plan.     Alfonzo Beers, MD 10/11/15 [phone]

## 2015-10-11 ENCOUNTER — Other Ambulatory Visit: Payer: Self-pay | Admitting: Family Medicine

## 2015-10-20 ENCOUNTER — Other Ambulatory Visit: Payer: Self-pay

## 2015-10-20 DIAGNOSIS — M7918 Myalgia, other site: Secondary | ICD-10-CM

## 2015-10-20 MED ORDER — DULOXETINE HCL 30 MG PO CPEP: 30.0000 mg | ORAL_CAPSULE | Freq: Every day | ORAL | Status: DC

## 2015-10-24 ENCOUNTER — Other Ambulatory Visit: Payer: Self-pay | Admitting: *Deleted

## 2015-10-24 DIAGNOSIS — M7918 Myalgia, other site: Secondary | ICD-10-CM

## 2015-10-24 MED ORDER — DULOXETINE HCL 30 MG PO CPEP: 30.0000 mg | ORAL_CAPSULE | Freq: Every day | ORAL | Status: DC

## 2015-11-15 ENCOUNTER — Other Ambulatory Visit: Payer: Self-pay | Admitting: Family Medicine

## 2015-11-15 DIAGNOSIS — Z1231 Encounter for screening mammogram for malignant neoplasm of breast: Secondary | ICD-10-CM

## 2015-11-30 ENCOUNTER — Ambulatory Visit: Payer: Self-pay | Attending: Family Medicine | Admitting: Family Medicine

## 2015-11-30 ENCOUNTER — Encounter: Payer: Self-pay | Admitting: Family Medicine

## 2015-11-30 VITALS — BP 128/82 | HR 101 | Temp 98.2°F | Resp 16 | Ht 62.0 in | Wt 267.0 lb

## 2015-11-30 DIAGNOSIS — M545 Low back pain, unspecified: Secondary | ICD-10-CM

## 2015-11-30 DIAGNOSIS — J329 Chronic sinusitis, unspecified: Secondary | ICD-10-CM | POA: Insufficient documentation

## 2015-11-30 DIAGNOSIS — G8929 Other chronic pain: Secondary | ICD-10-CM

## 2015-11-30 DIAGNOSIS — M549 Dorsalgia, unspecified: Secondary | ICD-10-CM | POA: Insufficient documentation

## 2015-11-30 DIAGNOSIS — M542 Cervicalgia: Secondary | ICD-10-CM

## 2015-11-30 DIAGNOSIS — J019 Acute sinusitis, unspecified: Secondary | ICD-10-CM

## 2015-11-30 DIAGNOSIS — Z1231 Encounter for screening mammogram for malignant neoplasm of breast: Secondary | ICD-10-CM

## 2015-11-30 DIAGNOSIS — I10 Essential (primary) hypertension: Secondary | ICD-10-CM

## 2015-11-30 DIAGNOSIS — Z0000 Encounter for general adult medical examination without abnormal findings: Secondary | ICD-10-CM

## 2015-11-30 DIAGNOSIS — Z79899 Other long term (current) drug therapy: Secondary | ICD-10-CM | POA: Insufficient documentation

## 2015-11-30 LAB — POCT GLYCOSYLATED HEMOGLOBIN (HGB A1C): HEMOGLOBIN A1C: 6.1

## 2015-11-30 MED ORDER — AMLODIPINE BESYLATE 10 MG PO TABS: 10.0000 mg | ORAL_TABLET | Freq: Every day | ORAL | Status: DC

## 2015-11-30 MED ORDER — LISINOPRIL 40 MG PO TABS: 40.0000 mg | ORAL_TABLET | Freq: Every day | ORAL | Status: DC

## 2015-11-30 MED ORDER — AMOXICILLIN 500 MG PO CAPS: 500.0000 mg | ORAL_CAPSULE | Freq: Three times a day (TID) | ORAL | Status: DC

## 2015-11-30 MED ORDER — TRAMADOL HCL 50 MG PO TABS: 50.0000 mg | ORAL_TABLET | Freq: Three times a day (TID) | ORAL | Status: DC | PRN

## 2015-11-30 MED ORDER — BENZONATATE 100 MG PO CAPS: 100.0000 mg | ORAL_CAPSULE | Freq: Two times a day (BID) | ORAL | Status: DC | PRN

## 2015-11-30 MED ORDER — HYDROCHLOROTHIAZIDE 25 MG PO TABS: 25.0000 mg | ORAL_TABLET | Freq: Every day | ORAL | Status: DC

## 2015-11-30 NOTE — Progress Notes (Signed)
C/C cold sx  Numbness on lt leg, neck pain x 2 wk no hx injury  Pain scale #5 No tobacco user  No suicidal thought in the past two weeks

## 2015-11-30 NOTE — Assessment & Plan Note (Signed)
Chronic pain Adding tramadol to pain control regimen

## 2015-11-30 NOTE — Progress Notes (Signed)
Subjective:  Patient ID: Michele Wood, female    DOB: 11/02/1960  Age: 55 y.o. MRN: [id number]  CC: Sinusitis; Neck Pain; and Back Pain   HPI Michele Wood presents for   1. Sinus infection: x 4 days. Sinus pressure. Congestion, cough. No fever or chills. No SOB or CP.   2. Chronic neck and back pain: stable back pain. L sided neck pain is slightly worse with increase cough. No new injury. No falls.   Social History  Substance Use Topics  . Smoking status: Never Smoker   . Smokeless tobacco: Not on file  . Alcohol Use: No     Comment: socially    Outpatient Prescriptions Prior to Visit  Medication Sig Dispense Refill  . amLODipine (NORVASC) 10 MG tablet Take 1 tablet (10 mg total) by mouth daily. 30 tablet 2  . Cholecalciferol (VITAMIN D3) 2000 UNITS TABS Take 2,000 Units by mouth daily. 30 tablet 11  . cyclobenzaprine (FLEXERIL) 10 MG tablet Take 1 tablet (10 mg total) by mouth 3 (three) times daily as needed for muscle spasms. 60 tablet 2  . diclofenac (VOLTAREN) 75 MG EC tablet Take 1 tablet (75 mg total) by mouth 2 (two) times daily as needed for moderate pain (with food). 60 tablet 2  . DULoxetine (CYMBALTA) 30 MG capsule Take 1 capsule (30 mg total) by mouth daily. 90 capsule 3  . ferrous sulfate 325 (65 FE) MG tablet Take 1 tablet (325 mg total) by mouth 2 (two) times daily with a meal. 60 tablet 3  . fluticasone (FLONASE) 50 MCG/ACT nasal spray Place 2 sprays into both nostrils at bedtime. 16 g 6  . gabapentin (NEURONTIN) 300 MG capsule Take 1 capsule (300 mg total) by mouth at bedtime. 30 capsule 2  . hydrochlorothiazide (HYDRODIURIL) 25 MG tablet TAKE 1 TABLET BY MOUTH ONCE DAILY(TAKE WITH LISINOPRIL 40MG ) 30 tablet 3  . lisinopril (PRINIVIL,ZESTRIL) 40 MG tablet TAKE 1 TABLET BY MOUTH ONCE DAILY ALONG WITH HYDROCHLOROTHIAZIDE 25MG  30 tablet 3  . omeprazole (PRILOSEC) 20 MG capsule Take 1 capsule (20 mg total) by mouth daily. (Patient not  taking: Reported on 11/30/2015) 30 capsule 0  . HYDROcodone-acetaminophen (HYCET) 7.5-325 mg/15 ml solution Take 15 mLs by mouth 4 (four) times daily as needed for moderate pain. 120 mL 0   No facility-administered medications prior to visit.    ROS Review of Systems  Constitutional: Negative for fever and chills.  HENT: Positive for congestion.   Eyes: Negative for visual disturbance.  Respiratory: Positive for cough. Negative for shortness of breath.   Cardiovascular: Negative for chest pain.  Gastrointestinal: Negative for abdominal pain and blood in stool.  Musculoskeletal: Positive for back pain, arthralgias and neck pain.  Skin: Negative for rash.  Allergic/Immunologic: Negative for immunocompromised state.  Hematological: Negative for adenopathy. Does not bruise/bleed easily.  Psychiatric/Behavioral: Negative for suicidal ideas and dysphoric mood.    Objective:  BP 128/82 mmHg  Pulse 101  Temp(Src) 98.2 F (36.8 C) (Oral)  Resp 16  Ht 5\' 2"  (1.575 m)  Wt 267 lb (121.11 kg)  BMI 48.82 kg/m2  SpO2 97%  LMP 09/01/2013  BP/Weight 11/30/2015 10/10/2015 123XX123  Systolic BP 0000000 123XX123 0000000  Diastolic BP 82 81 94  Wt. (Lbs) 267 - 264  BMI 48.82 - 48.27   Physical Exam  Constitutional: She is oriented to person, place, and time. She appears well-developed and well-nourished. No distress.  HENT:  Head: Normocephalic and atraumatic.  Nose: Mucosal  edema present. Right sinus exhibits frontal sinus tenderness. Right sinus exhibits no maxillary sinus tenderness. Left sinus exhibits no maxillary sinus tenderness and no frontal sinus tenderness.  Mouth/Throat: Oropharynx is clear and moist.  Eyes: Conjunctivae are normal. Pupils are equal, round, and reactive to light.  Cardiovascular: Normal rate, regular rhythm, normal heart sounds and intact distal pulses.   Pulmonary/Chest: Effort normal and breath sounds normal.  Musculoskeletal: She exhibits no edema.  Lymphadenopathy:     She has no cervical adenopathy.  Neurological: She is alert and oriented to person, place, and time.  Skin: Skin is warm and dry. No rash noted.  Psychiatric: She has a normal mood and affect.   Lab Results  Component Value Date   HGBA1C 6.10 11/30/2015    Assessment & Plan:  Michele Wood was seen today for sinusitis, neck pain and back pain.  Diagnoses and all orders for this visit:  Healthcare maintenance -     POCT glycosylated hemoglobin (Hb A1C) -     Ambulatory referral to Gastroenterology  Essential hypertension -     amLODipine (NORVASC) 10 MG tablet; Take 1 tablet (10 mg total) by mouth daily. -     hydrochlorothiazide (HYDRODIURIL) 25 MG tablet; Take 1 tablet (25 mg total) by mouth daily. -     lisinopril (PRINIVIL,ZESTRIL) 40 MG tablet; Take 1 tablet (40 mg total) by mouth daily.  Chronic neck pain -     traMADol (ULTRAM) 50 MG tablet; Take 1 tablet (50 mg total) by mouth every 8 (eight) hours as needed.  Chronic bilateral low back pain without sciatica -     traMADol (ULTRAM) 50 MG tablet; Take 1 tablet (50 mg total) by mouth every 8 (eight) hours as needed.  Acute sinusitis, recurrence not specified, unspecified location -     amoxicillin (AMOXIL) 500 MG capsule; Take 1 capsule (500 mg total) by mouth 3 (three) times daily. -     benzonatate (TESSALON) 100 MG capsule; Take 1 capsule (100 mg total) by mouth 2 (two) times daily as needed for cough.  Visit for screening mammogram -     MM DIGITAL SCREENING BILATERAL; Future   No orders of the defined types were placed in this encounter.    Follow-up: No Follow-up on file.   Boykin Nearing MD

## 2015-11-30 NOTE — Assessment & Plan Note (Signed)
A: viral URI P: Supportive care amox at day 10 if no better.

## 2015-11-30 NOTE — Patient Instructions (Addendum)
Michele Wood was seen today for sinusitis, neck pain and back pain.  Diagnoses and all orders for this visit:  Healthcare maintenance -     POCT glycosylated hemoglobin (Hb A1C) -     Ambulatory referral to Gastroenterology  Essential hypertension -     amLODipine (NORVASC) 10 MG tablet; Take 1 tablet (10 mg total) by mouth daily. -     hydrochlorothiazide (HYDRODIURIL) 25 MG tablet; Take 1 tablet (25 mg total) by mouth daily. -     lisinopril (PRINIVIL,ZESTRIL) 40 MG tablet; Take 1 tablet (40 mg total) by mouth daily.  Chronic neck pain -     traMADol (ULTRAM) 50 MG tablet; Take 1 tablet (50 mg total) by mouth every 8 (eight) hours as needed.  Chronic bilateral low back pain without sciatica -     traMADol (ULTRAM) 50 MG tablet; Take 1 tablet (50 mg total) by mouth every 8 (eight) hours as needed.  Acute sinusitis, recurrence not specified, unspecified location -     amoxicillin (AMOXIL) 500 MG capsule; Take 1 capsule (500 mg total) by mouth 3 (three) times daily. -     benzonatate (TESSALON) 100 MG capsule; Take 1 capsule (100 mg total) by mouth 2 (two) times daily as needed for cough.  Visit for screening mammogram -     MM DIGITAL SCREENING BILATERAL; Future   You have a viral URI with cough. For this please do the following:  1. Drink plenty of fluids. Hot tea, soup etc will help open your nasal passages. 2. Dextromethorphan 30 mg every 6-8 hrs (plain Robitussin) for cough suppression 3. Tylenol for pain up to 1000 mg three times daily.  4. Nasal saline-OTC nose spray for congestion.   F/u for chest pain, shortness of breath, persistent high fever.   F/u in 3 months for chronic neck pain   Dr. Adrian Blackwater

## 2015-12-08 ENCOUNTER — Encounter: Payer: Self-pay | Admitting: Clinical

## 2015-12-08 NOTE — Progress Notes (Signed)
Depression screen [facility] 2/9 11/30/2015 09/18/2015 06/08/2015 10/26/2013 07/01/2013  Decreased Interest 2 2 0 3 3  Down, Depressed, Hopeless 2 2 0 3 3  PHQ - 2 Score 4 4 0 6 6  Altered sleeping 3 3 - 2 3  Tired, decreased energy 2 2 - 2 3  Change in appetite 0 1 - 3 3  Feeling bad or failure about yourself  0 1 - 3 3  Trouble concentrating 0 2 - 1 3  Moving slowly or fidgety/restless 0 0 - 3 3  Suicidal thoughts 0 1 - 2 0  PHQ-9 Score 9 14 - 22 24    GAD 7 : Generalized Anxiety Score 11/30/2015 09/18/2015  Nervous, Anxious, on Edge 1 2  Control/stop worrying 3 3  Worry too much - different things 3 3  Trouble relaxing 3 3  Restless 1 1  Easily annoyed or irritable 2 2  Afraid - awful might happen 1 1  Total GAD 7 Score 14 15

## 2015-12-18 ENCOUNTER — Other Ambulatory Visit: Payer: Self-pay | Admitting: Family Medicine

## 2015-12-22 ENCOUNTER — Ambulatory Visit: Payer: Self-pay | Attending: Family Medicine

## 2016-03-21 ENCOUNTER — Other Ambulatory Visit: Payer: Self-pay | Admitting: Family Medicine

## 2016-04-23 ENCOUNTER — Other Ambulatory Visit: Payer: Self-pay | Admitting: Family Medicine

## 2016-05-27 ENCOUNTER — Other Ambulatory Visit: Payer: Self-pay | Admitting: Family Medicine

## 2016-06-26 ENCOUNTER — Other Ambulatory Visit: Payer: Self-pay | Admitting: Family Medicine

## 2016-07-29 ENCOUNTER — Ambulatory Visit: Payer: Self-pay | Attending: Internal Medicine

## 2016-07-29 ENCOUNTER — Other Ambulatory Visit: Payer: Self-pay | Admitting: Family Medicine

## 2016-08-22 ENCOUNTER — Ambulatory Visit: Payer: Self-pay | Attending: Family Medicine | Admitting: Family Medicine

## 2016-08-22 VITALS — BP 146/89 | HR 82 | Temp 98.2°F | Wt 265.2 lb

## 2016-08-22 DIAGNOSIS — M79602 Pain in left arm: Secondary | ICD-10-CM | POA: Insufficient documentation

## 2016-08-22 DIAGNOSIS — M542 Cervicalgia: Secondary | ICD-10-CM

## 2016-08-22 DIAGNOSIS — J324 Chronic pansinusitis: Secondary | ICD-10-CM

## 2016-08-22 DIAGNOSIS — G8929 Other chronic pain: Secondary | ICD-10-CM

## 2016-08-22 DIAGNOSIS — I10 Essential (primary) hypertension: Secondary | ICD-10-CM

## 2016-08-22 DIAGNOSIS — M791 Myalgia: Secondary | ICD-10-CM

## 2016-08-22 DIAGNOSIS — M25512 Pain in left shoulder: Secondary | ICD-10-CM | POA: Insufficient documentation

## 2016-08-22 DIAGNOSIS — J328 Other chronic sinusitis: Secondary | ICD-10-CM | POA: Insufficient documentation

## 2016-08-22 DIAGNOSIS — M545 Low back pain: Secondary | ICD-10-CM

## 2016-08-22 DIAGNOSIS — Z23 Encounter for immunization: Secondary | ICD-10-CM

## 2016-08-22 DIAGNOSIS — R51 Headache: Secondary | ICD-10-CM | POA: Insufficient documentation

## 2016-08-22 DIAGNOSIS — M7918 Myalgia, other site: Secondary | ICD-10-CM

## 2016-08-22 MED ORDER — AMOXICILLIN 500 MG PO CAPS: 500.0000 mg | ORAL_CAPSULE | Freq: Three times a day (TID) | ORAL | 0 refills | Status: DC

## 2016-08-22 MED ORDER — DICLOFENAC SODIUM 75 MG PO TBEC: 75.0000 mg | DELAYED_RELEASE_TABLET | Freq: Two times a day (BID) | ORAL | 2 refills | Status: DC | PRN

## 2016-08-22 MED ORDER — LORATADINE 10 MG PO TABS: 10.0000 mg | ORAL_TABLET | Freq: Every day | ORAL | 11 refills | Status: DC

## 2016-08-22 MED ORDER — HYDROCHLOROTHIAZIDE 25 MG PO TABS: 25.0000 mg | ORAL_TABLET | Freq: Every day | ORAL | 11 refills | Status: DC

## 2016-08-22 MED ORDER — GABAPENTIN 300 MG PO CAPS: 300.0000 mg | ORAL_CAPSULE | Freq: Two times a day (BID) | ORAL | 5 refills | Status: DC

## 2016-08-22 MED ORDER — AMLODIPINE BESYLATE 10 MG PO TABS: 10.0000 mg | ORAL_TABLET | Freq: Every day | ORAL | 11 refills | Status: DC

## 2016-08-22 MED ORDER — TRIAMCINOLONE ACETONIDE 55 MCG/ACT NA AERO: 2.0000 | INHALATION_SPRAY | Freq: Every day | NASAL | 12 refills | Status: DC

## 2016-08-22 MED ORDER — MONTELUKAST SODIUM 10 MG PO TABS: 10.0000 mg | ORAL_TABLET | Freq: Every day | ORAL | 3 refills | Status: DC

## 2016-08-22 MED ORDER — LISINOPRIL 40 MG PO TABS: 40.0000 mg | ORAL_TABLET | Freq: Every day | ORAL | 11 refills | Status: DC

## 2016-08-22 NOTE — Progress Notes (Signed)
Pt has neck pain, shoulder pain, face pain, and arm pain on the left side Pt uses a cane that's why I put needs assistance

## 2016-08-22 NOTE — Patient Instructions (Signed)
Michele Wood was seen today for facial pain, neck pain, shoulder pain and arm pain.  Diagnoses and all orders for this visit:  Chronic pansinusitis -     loratadine (CLARITIN) 10 MG tablet; Take 1 tablet (10 mg total) by mouth daily. -     triamcinolone (NASACORT AQ) 55 MCG/ACT AERO nasal inhaler; Place 2 sprays into the nose daily. -     montelukast (SINGULAIR) 10 MG tablet; Take 1 tablet (10 mg total) by mouth at bedtime. -     amoxicillin (AMOXIL) 500 MG capsule; Take 1 capsule (500 mg total) by mouth 3 (three) times daily. -     Ambulatory referral to ENT  Musculoskeletal pain -     gabapentin (NEURONTIN) 300 MG capsule; Take 1 capsule (300 mg total) by mouth 2 (two) times daily. -     diclofenac (VOLTAREN) 75 MG EC tablet; Take 1 tablet (75 mg total) by mouth 2 (two) times daily as needed for moderate pain (with food).  Essential hypertension -     amLODipine (NORVASC) 10 MG tablet; Take 1 tablet (10 mg total) by mouth daily. -     lisinopril (PRINIVIL,ZESTRIL) 40 MG tablet; Take 1 tablet (40 mg total) by mouth daily. -     hydrochlorothiazide (HYDRODIURIL) 25 MG tablet; Take 1 tablet (25 mg total) by mouth daily.   F/u in 4 weeks for HTN  Dr. Adrian Blackwater

## 2016-08-22 NOTE — Progress Notes (Signed)
Subjective:  Patient ID: Michele Wood, female    DOB: 04/13/1961  Age: 55 y.o. MRN: [id number]  CC: Facial Pain; Neck Pain; Shoulder Pain (left); and Arm Pain (left)   HPI Michele Wood presents for   1. Sinus infection: has hx of chronic sinusitis. Hx of sinus surgery x 2. Having congestion, runny nose, pressure in sinuses and ears. No fever.   2. Chronic neck and back pain: stable back pain. L sided neck pain is slightly worse with increase cough. No new injury. No falls.   3. HTN: compliant with current regimen. Having HA. No CP or SOB with exertion. No leg swelling.   Social History  Substance Use Topics  . Smoking status: Never Smoker  . Smokeless tobacco: Not on file  . Alcohol use No     Comment: socially    Outpatient Medications Prior to Visit  Medication Sig Dispense Refill  . amLODipine (NORVASC) 10 MG tablet TAKE 1 TABLET BY MOUTH DAILY. 30 tablet 0  . benzonatate (TESSALON) 100 MG capsule Take 1 capsule (100 mg total) by mouth 2 (two) times daily as needed for cough. 20 capsule 0  . Cholecalciferol (VITAMIN D3) 2000 UNITS TABS Take 2,000 Units by mouth daily. 30 tablet 11  . cyclobenzaprine (FLEXERIL) 10 MG tablet Take 1 tablet (10 mg total) by mouth 3 (three) times daily as needed for muscle spasms. 60 tablet 2  . diclofenac (VOLTAREN) 75 MG EC tablet Take 1 tablet (75 mg total) by mouth 2 (two) times daily as needed for moderate pain (with food). 60 tablet 2  . DULoxetine (CYMBALTA) 30 MG capsule Take 1 capsule (30 mg total) by mouth daily. 90 capsule 3  . gabapentin (NEURONTIN) 300 MG capsule Take 1 capsule (300 mg total) by mouth at bedtime. 30 capsule 0  . hydrochlorothiazide (HYDRODIURIL) 25 MG tablet Take 1 tablet (25 mg total) by mouth daily. 30 tablet 3  . lisinopril (PRINIVIL,ZESTRIL) 40 MG tablet Take 1 tablet (40 mg total) by mouth daily. 30 tablet 3  . traMADol (ULTRAM) 50 MG tablet Take 1 tablet (50 mg total) by mouth every 8  (eight) hours as needed. 60 tablet 0  . amoxicillin (AMOXIL) 500 MG capsule Take 1 capsule (500 mg total) by mouth 3 (three) times daily. (Patient not taking: Reported on 08/22/2016) 30 capsule 0  . ferrous sulfate 325 (65 FE) MG tablet Take 1 tablet (325 mg total) by mouth 2 (two) times daily with a meal. (Patient not taking: Reported on 08/22/2016) 60 tablet 3  . fluticasone (FLONASE) 50 MCG/ACT nasal spray Place 2 sprays into both nostrils at bedtime. (Patient not taking: Reported on 08/22/2016) 16 g 6  . omeprazole (PRILOSEC) 20 MG capsule Take 1 capsule (20 mg total) by mouth daily. (Patient not taking: Reported on 08/22/2016) 30 capsule 0   No facility-administered medications prior to visit.     ROS Review of Systems  Constitutional: Negative for chills and fever.  HENT: Positive for congestion.   Eyes: Negative for visual disturbance.  Respiratory: Positive for cough. Negative for shortness of breath.   Cardiovascular: Negative for chest pain.  Gastrointestinal: Negative for abdominal pain and blood in stool.  Musculoskeletal: Positive for arthralgias, back pain and neck pain.  Skin: Negative for rash.  Allergic/Immunologic: Negative for immunocompromised state.  Hematological: Negative for adenopathy. Does not bruise/bleed easily.  Psychiatric/Behavioral: Negative for dysphoric mood and suicidal ideas.    Objective:  BP (!) 146/89 (BP Location: Right Arm,  Patient Position: Sitting, Cuff Size: Large)   Pulse 82   Temp 98.2 F (36.8 C) (Oral)   Wt 265 lb 3.2 oz (120.3 kg)   LMP 09/01/2013   SpO2 98%   BMI 48.51 kg/m   BP/Weight 08/22/2016 11/30/2015 [id number]  Systolic BP 123456 0000000 123XX123  Diastolic BP 89 82 81  Wt. (Lbs) 265.2 267 -  BMI 48.51 48.82 -   Physical Exam  Constitutional: She is oriented to person, place, and time. She appears well-developed and well-nourished. No distress.  HENT:  Head: Normocephalic and atraumatic.  Nose: Mucosal edema present. Right sinus  exhibits frontal sinus tenderness. Right sinus exhibits no maxillary sinus tenderness. Left sinus exhibits no maxillary sinus tenderness and no frontal sinus tenderness.  Mouth/Throat: Oropharynx is clear and moist.  Eyes: Conjunctivae are normal. Pupils are equal, round, and reactive to light.  Cardiovascular: Normal rate, regular rhythm, normal heart sounds and intact distal pulses.   Pulmonary/Chest: Effort normal and breath sounds normal.  Musculoskeletal: She exhibits no edema.  Lymphadenopathy:    She has no cervical adenopathy.  Neurological: She is alert and oriented to person, place, and time.  Skin: Skin is warm and dry. No rash noted.  Psychiatric: She has a normal mood and affect.   Lab Results  Component Value Date   HGBA1C 6.10 11/30/2015    Assessment & Plan:  Michele Wood was seen today for facial pain, neck pain, shoulder pain and arm pain.  Diagnoses and all orders for this visit:  Chronic pansinusitis -     loratadine (CLARITIN) 10 MG tablet; Take 1 tablet (10 mg total) by mouth daily. -     triamcinolone (NASACORT AQ) 55 MCG/ACT AERO nasal inhaler; Place 2 sprays into the nose daily. -     montelukast (SINGULAIR) 10 MG tablet; Take 1 tablet (10 mg total) by mouth at bedtime. -     amoxicillin (AMOXIL) 500 MG capsule; Take 1 capsule (500 mg total) by mouth 3 (three) times daily. -     Ambulatory referral to ENT  Musculoskeletal pain -     gabapentin (NEURONTIN) 300 MG capsule; Take 1 capsule (300 mg total) by mouth 2 (two) times daily. -     diclofenac (VOLTAREN) 75 MG EC tablet; Take 1 tablet (75 mg total) by mouth 2 (two) times daily as needed for moderate pain (with food).  Essential hypertension -     amLODipine (NORVASC) 10 MG tablet; Take 1 tablet (10 mg total) by mouth daily. -     lisinopril (PRINIVIL,ZESTRIL) 40 MG tablet; Take 1 tablet (40 mg total) by mouth daily. -     hydrochlorothiazide (HYDRODIURIL) 25 MG tablet; Take 1 tablet (25 mg total) by mouth  daily.  Encounter for immunization -     Flu Vaccine QUAD 36+ mos IM   No orders of the defined types were placed in this encounter.   Follow-up: Return in about 4 weeks (around 09/19/2016) for HTN .   Boykin Nearing MD

## 2016-08-23 ENCOUNTER — Encounter: Payer: Self-pay | Admitting: Family Medicine

## 2016-08-23 NOTE — Assessment & Plan Note (Signed)
Chronic MSK pain Continue gabapentin and Voltaren

## 2016-08-23 NOTE — Assessment & Plan Note (Signed)
Slightly elevated today Continue current regimen Close fu for recheck

## 2016-08-23 NOTE — Assessment & Plan Note (Signed)
Chronic sinusitis  Claritin, Nasacort, Singulair ENT referral amox course

## 2016-09-03 ENCOUNTER — Ambulatory Visit: Payer: Self-pay | Attending: Family Medicine | Admitting: Family Medicine

## 2016-09-03 ENCOUNTER — Other Ambulatory Visit: Payer: Self-pay | Admitting: Family Medicine

## 2016-09-03 ENCOUNTER — Encounter: Payer: Self-pay | Admitting: Family Medicine

## 2016-09-03 VITALS — BP 135/85 | HR 75 | Temp 98.0°F | Ht 62.0 in | Wt 268.2 lb

## 2016-09-03 DIAGNOSIS — G8929 Other chronic pain: Secondary | ICD-10-CM | POA: Insufficient documentation

## 2016-09-03 DIAGNOSIS — Z1231 Encounter for screening mammogram for malignant neoplasm of breast: Secondary | ICD-10-CM

## 2016-09-03 DIAGNOSIS — J324 Chronic pansinusitis: Secondary | ICD-10-CM

## 2016-09-03 DIAGNOSIS — I10 Essential (primary) hypertension: Secondary | ICD-10-CM | POA: Insufficient documentation

## 2016-09-03 DIAGNOSIS — M7918 Myalgia, other site: Secondary | ICD-10-CM

## 2016-09-03 DIAGNOSIS — Z1159 Encounter for screening for other viral diseases: Secondary | ICD-10-CM

## 2016-09-03 DIAGNOSIS — M791 Myalgia: Secondary | ICD-10-CM | POA: Insufficient documentation

## 2016-09-03 DIAGNOSIS — J329 Chronic sinusitis, unspecified: Secondary | ICD-10-CM | POA: Insufficient documentation

## 2016-09-03 DIAGNOSIS — M542 Cervicalgia: Secondary | ICD-10-CM | POA: Insufficient documentation

## 2016-09-03 DIAGNOSIS — M549 Dorsalgia, unspecified: Secondary | ICD-10-CM | POA: Insufficient documentation

## 2016-09-03 DIAGNOSIS — Z23 Encounter for immunization: Secondary | ICD-10-CM

## 2016-09-03 DIAGNOSIS — Z114 Encounter for screening for human immunodeficiency virus [HIV]: Secondary | ICD-10-CM

## 2016-09-03 LAB — HIV ANTIBODY (ROUTINE TESTING W REFLEX): HIV 1&2 Ab, 4th Generation: NONREACTIVE

## 2016-09-03 MED ORDER — TETANUS-DIPHTH-ACELL PERTUSSIS 5-2.5-18.5 LF-MCG/0.5 IM SUSP: 0.5000 mL | Freq: Once | INTRAMUSCULAR | Status: DC

## 2016-09-03 MED ORDER — GABAPENTIN 300 MG PO CAPS: 600.0000 mg | ORAL_CAPSULE | Freq: Every day | ORAL | 5 refills | Status: DC

## 2016-09-03 NOTE — Patient Instructions (Addendum)
Michele Wood was seen today for hypertension.  Diagnoses and all orders for this visit:  Essential hypertension  Musculoskeletal pain -     gabapentin (NEURONTIN) 300 MG capsule; Take 2 capsules (600 mg total) by mouth at bedtime.  Screening for HIV (human immunodeficiency virus) -     HIV antibody (with reflex)  Need for hepatitis C screening test -     Hepatitis C antibody, reflex  Visit for screening mammogram -     MM DIGITAL SCREENING BILATERAL; Future  Need for Tdap vaccination -     Tdap (BOOSTRIX) injection 0.5 mL; Inject 0.5 mLs into the muscle once.  your ENT referral has been placed the appointment will be scheduled as soon as possible  F/u in 3 montsh for HTN  Dr. Adrian Blackwater

## 2016-09-03 NOTE — Progress Notes (Signed)
Subjective:  Patient ID: Michele Wood, female    DOB: 11-02-1960  Age: 55 y.o. MRN: [id number]  CC: Hypertension   HPI Michele Wood presents for   1. Sinus infection: has hx of chronic sinusitis. Hx of sinus surgery x 2. Having congestion, runny nose, pressure in sinuses and ears. No fever. Symptoms have improved since course of amoxicillin.   2. Chronic neck and back pain: stable back pain. L sided neck pain has improved since last visit. Taking gabapentin 600 mg nightly.  3. HTN: compliant with current regimen. Having HA. No CP or SOB with exertion. No leg swelling.   Social History  Substance Use Topics  . Smoking status: Never Smoker  . Smokeless tobacco: Not on file  . Alcohol use No     Comment: socially    Outpatient Medications Prior to Visit  Medication Sig Dispense Refill  . amLODipine (NORVASC) 10 MG tablet Take 1 tablet (10 mg total) by mouth daily. 30 tablet 11  . amoxicillin (AMOXIL) 500 MG capsule Take 1 capsule (500 mg total) by mouth 3 (three) times daily. 30 capsule 0  . Cholecalciferol (VITAMIN D3) 2000 UNITS TABS Take 2,000 Units by mouth daily. 30 tablet 11  . cyclobenzaprine (FLEXERIL) 10 MG tablet Take 1 tablet (10 mg total) by mouth 3 (three) times daily as needed for muscle spasms. 60 tablet 2  . diclofenac (VOLTAREN) 75 MG EC tablet Take 1 tablet (75 mg total) by mouth 2 (two) times daily as needed for moderate pain (with food). 60 tablet 2  . DULoxetine (CYMBALTA) 30 MG capsule Take 1 capsule (30 mg total) by mouth daily. 90 capsule 3  . gabapentin (NEURONTIN) 300 MG capsule Take 1 capsule (300 mg total) by mouth 2 (two) times daily. 60 capsule 5  . hydrochlorothiazide (HYDRODIURIL) 25 MG tablet Take 1 tablet (25 mg total) by mouth daily. 30 tablet 11  . lisinopril (PRINIVIL,ZESTRIL) 40 MG tablet Take 1 tablet (40 mg total) by mouth daily. 30 tablet 11  . loratadine (CLARITIN) 10 MG tablet Take 1 tablet (10 mg total) by mouth  daily. 30 tablet 11  . montelukast (SINGULAIR) 10 MG tablet Take 1 tablet (10 mg total) by mouth at bedtime. 30 tablet 3  . omeprazole (PRILOSEC) 20 MG capsule Take 1 capsule (20 mg total) by mouth daily. (Patient not taking: Reported on 08/22/2016) 30 capsule 0  . triamcinolone (NASACORT AQ) 55 MCG/ACT AERO nasal inhaler Place 2 sprays into the nose daily. 1 Inhaler 12   No facility-administered medications prior to visit.     ROS Review of Systems  Constitutional: Negative for chills and fever.  HENT: Positive for congestion.   Eyes: Negative for visual disturbance.  Respiratory: Positive for cough. Negative for shortness of breath.   Cardiovascular: Negative for chest pain.  Gastrointestinal: Negative for abdominal pain and blood in stool.  Musculoskeletal: Positive for arthralgias, back pain and neck pain.  Skin: Negative for rash.  Allergic/Immunologic: Negative for immunocompromised state.  Hematological: Negative for adenopathy. Does not bruise/bleed easily.  Psychiatric/Behavioral: Negative for dysphoric mood and suicidal ideas.    Objective:  BP 135/85 (BP Location: Left Arm, Patient Position: Sitting, Cuff Size: Large)   Pulse 75   Temp 98 F (36.7 C) (Oral)   Ht 5\' 2"  (1.575 m)   Wt 268 lb 3.2 oz (121.7 kg)   LMP 09/01/2013   SpO2 95%   BMI 49.05 kg/m   BP/Weight 09/03/2016 08/22/2016 [id number]  Systolic BP  [id number]  Diastolic BP 85 89 82  Wt. (Lbs) 268.2 265.2 267  BMI 49.05 48.51 48.82   Physical Exam  Constitutional: She is oriented to person, place, and time. She appears well-developed and well-nourished. No distress.  HENT:  Head: Normocephalic and atraumatic.  Nose: Mucosal edema present. Right sinus exhibits frontal sinus tenderness. Right sinus exhibits no maxillary sinus tenderness. Left sinus exhibits no maxillary sinus tenderness and no frontal sinus tenderness.  Mouth/Throat: Oropharynx is clear and moist.  Eyes: Conjunctivae are normal. Pupils  are equal, round, and reactive to light.  Cardiovascular: Normal rate, regular rhythm, normal heart sounds and intact distal pulses.   Pulmonary/Chest: Effort normal and breath sounds normal.  Musculoskeletal: She exhibits no edema.  Lymphadenopathy:    She has no cervical adenopathy.  Neurological: She is alert and oriented to person, place, and time.  Skin: Skin is warm and dry. No rash noted.  Psychiatric: She has a normal mood and affect.   Lab Results  Component Value Date   HGBA1C 6.10 11/30/2015    Assessment & Plan:  Trenidad was seen today for hypertension.  Diagnoses and all orders for this visit:  Essential hypertension  Musculoskeletal pain -     gabapentin (NEURONTIN) 300 MG capsule; Take 2 capsules (600 mg total) by mouth at bedtime.  Screening for HIV (human immunodeficiency virus) -     HIV antibody (with reflex)  Need for hepatitis C screening test -     Hepatitis C antibody, reflex  Visit for screening mammogram -     MM DIGITAL SCREENING BILATERAL; Future  Need for Tdap vaccination -     Discontinue: Tdap (BOOSTRIX) injection 0.5 mL; Inject 0.5 mLs into the muscle once.  Other orders -     Tdap vaccine greater than or equal to 7yo IM   No orders of the defined types were placed in this encounter.   Follow-up: Return in about 3 months (around 12/04/2016) for HTN .   Boykin Nearing MD

## 2016-09-04 LAB — HEPATITIS C ANTIBODY: HCV AB: NEGATIVE

## 2016-09-04 NOTE — Assessment & Plan Note (Signed)
HTN controlled Continue current regimen

## 2016-09-04 NOTE — Assessment & Plan Note (Signed)
Improved with amoxicillin Continue singulair, claritin, nasacort  She has been referred to ENT, awaiting appt

## 2016-11-06 ENCOUNTER — Other Ambulatory Visit: Payer: Self-pay | Admitting: Family Medicine

## 2016-11-06 DIAGNOSIS — M7918 Myalgia, other site: Secondary | ICD-10-CM

## 2016-11-07 ENCOUNTER — Other Ambulatory Visit: Payer: Self-pay | Admitting: Family Medicine

## 2016-11-07 DIAGNOSIS — M7918 Myalgia, other site: Secondary | ICD-10-CM

## 2016-11-21 ENCOUNTER — Ambulatory Visit: Payer: Self-pay | Attending: Family Medicine

## 2016-11-25 ENCOUNTER — Ambulatory Visit: Payer: Self-pay | Attending: Family Medicine

## 2016-11-26 ENCOUNTER — Ambulatory Visit
Admission: RE | Admit: 2016-11-26 | Discharge: 2016-11-26 | Disposition: A | Discharge status: Home / Self Care | Payer: No Typology Code available for payment source | Source: Ambulatory Visit | Attending: Family Medicine | Admitting: Family Medicine

## 2016-11-26 DIAGNOSIS — Z1231 Encounter for screening mammogram for malignant neoplasm of breast: Secondary | ICD-10-CM

## 2016-11-28 ENCOUNTER — Other Ambulatory Visit: Payer: Self-pay

## 2016-11-28 DIAGNOSIS — M7918 Myalgia, other site: Secondary | ICD-10-CM

## 2016-11-28 MED ORDER — DULOXETINE HCL 30 MG PO CPEP: 30.0000 mg | ORAL_CAPSULE | Freq: Every day | ORAL | 3 refills | Status: DC

## 2016-12-03 ENCOUNTER — Ambulatory Visit: Payer: Self-pay | Attending: Family Medicine | Admitting: Family Medicine

## 2016-12-03 VITALS — BP 129/85 | HR 76 | Temp 98.2°F | Ht 62.0 in | Wt 262.6 lb

## 2016-12-03 DIAGNOSIS — G8929 Other chronic pain: Secondary | ICD-10-CM | POA: Insufficient documentation

## 2016-12-03 DIAGNOSIS — J324 Chronic pansinusitis: Secondary | ICD-10-CM

## 2016-12-03 DIAGNOSIS — M542 Cervicalgia: Secondary | ICD-10-CM | POA: Insufficient documentation

## 2016-12-03 DIAGNOSIS — R42 Dizziness and giddiness: Secondary | ICD-10-CM | POA: Insufficient documentation

## 2016-12-03 DIAGNOSIS — I10 Essential (primary) hypertension: Secondary | ICD-10-CM | POA: Insufficient documentation

## 2016-12-03 DIAGNOSIS — J329 Chronic sinusitis, unspecified: Secondary | ICD-10-CM | POA: Insufficient documentation

## 2016-12-03 DIAGNOSIS — M545 Low back pain: Secondary | ICD-10-CM | POA: Insufficient documentation

## 2016-12-03 DIAGNOSIS — R51 Headache: Secondary | ICD-10-CM | POA: Insufficient documentation

## 2016-12-03 MED ORDER — AMOXICILLIN-POT CLAVULANATE 875-125 MG PO TABS: 1.0000 | ORAL_TABLET | Freq: Two times a day (BID) | ORAL | 0 refills | Status: DC

## 2016-12-03 MED ORDER — PREDNISONE 10 MG PO TABS: ORAL_TABLET | ORAL | 0 refills | Status: DC

## 2016-12-03 MED ORDER — OMEPRAZOLE 20 MG PO CPDR: 20.0000 mg | DELAYED_RELEASE_CAPSULE | Freq: Two times a day (BID) | ORAL | 0 refills | Status: DC

## 2016-12-03 NOTE — Patient Instructions (Addendum)
Michele Wood was seen today for hypertension.  Diagnoses and all orders for this visit:  Essential hypertension  Chronic pansinusitis -     predniSONE (DELTASONE) 10 MG tablet; Take by mouth 20 mg twice daily for 5 days, then 10 mg twice daily for 5 days, then 10 mg once daily for 5 days -     amoxicillin-clavulanate (AUGMENTIN) 875-125 MG tablet; Take 1 tablet by mouth 2 (two) times daily. For 3 weeks -     omeprazole (PRILOSEC) 20 MG capsule; Take 1 capsule (20 mg total) by mouth 2 (two) times daily before a meal.   Your blood pressure is well controlled   Take a 15 day taper of prednisone and 3 week course of Augmentin for chronic sinusitis Do not take diclofenac while taking prednisone, take prilosec   F/u in 6 weeks for chronic sinusitis   Dr. Adrian Blackwater

## 2016-12-03 NOTE — Assessment & Plan Note (Signed)
Well controlled Compliant with regimen Continue current regimen 

## 2016-12-03 NOTE — Assessment & Plan Note (Signed)
Return of sinus pressure, dizziness, ear pain Plan: Treat chronic sinusitis with 15 day prednisone taper and 21 days of augment ENT referral already in place, patient uninsured, awaiting appt

## 2016-12-03 NOTE — Progress Notes (Signed)
Subjective:  Patient ID: Michele Wood, female    DOB: [birth year]  Age: 56 y.o. MRN: [id number]  CC: Hypertension   HPI Michele Wood has hx of chronic sinusitis, chronic neck pain, chronic low back pain and HTN she  presents for    1. HTN: compliant with current regimen.  No CP or SOB with exertion. No leg swelling. Having headache sometimes.    2. Chronic sinusitis: having Frontal headache. No cough or congestion currently. She took a medicine called candida clear. She has some pressure behind her ears. That is worse on the L side.  Having some dizzy spells. Described as room spinning. She last felt it this morning.  She has had previous sinus surgeries.   3. Chronic neck and back pain: stable back pain. L sided neck pain has improved since last visit. Taking gabapentin 600 mg nightly. Current pain level is 6/10. Started hurting last night. Also has pain when she does too much walking.   Past Surgical History:  Procedure Laterality Date  . ABDOMINAL HYSTERECTOMY    . BREAST BIOPSY Right 07/02/2006   [place] Core benign    Social History  Substance Use Topics  . Smoking status: Never Smoker  . Smokeless tobacco: Not on file  . Alcohol use No     Comment: socially    Outpatient Medications Prior to Visit  Medication Sig Dispense Refill  . amLODipine (NORVASC) 10 MG tablet Take 1 tablet (10 mg total) by mouth daily. 30 tablet 11  . Cholecalciferol (VITAMIN D3) 2000 UNITS TABS Take 2,000 Units by mouth daily. 30 tablet 11  . cyclobenzaprine (FLEXERIL) 10 MG tablet TAKE 1 TABLET BY MOUTH 3 TIMES DAILY AS NEEDED FOR MUSCLE SPASMS 60 tablet 0  . diclofenac (VOLTAREN) 75 MG EC tablet Take 1 tablet (75 mg total) by mouth 2 (two) times daily as needed for moderate pain (with food). 60 tablet 2  . DULoxetine (CYMBALTA) 30 MG capsule Take 1 capsule (30 mg total) by mouth daily. 90 capsule 3  . gabapentin (NEURONTIN) 300 MG capsule Take 2 capsules (600 mg total) by  mouth at bedtime. 60 capsule 5  . hydrochlorothiazide (HYDRODIURIL) 25 MG tablet Take 1 tablet (25 mg total) by mouth daily. 30 tablet 11  . lisinopril (PRINIVIL,ZESTRIL) 40 MG tablet Take 1 tablet (40 mg total) by mouth daily. 30 tablet 11  . loratadine (CLARITIN) 10 MG tablet Take 1 tablet (10 mg total) by mouth daily. 30 tablet 11  . montelukast (SINGULAIR) 10 MG tablet Take 1 tablet (10 mg total) by mouth at bedtime. 30 tablet 3  . triamcinolone (NASACORT AQ) 55 MCG/ACT AERO nasal inhaler Place 2 sprays into the nose daily. 1 Inhaler 12  . omeprazole (PRILOSEC) 20 MG capsule Take 1 capsule (20 mg total) by mouth daily. (Patient not taking: Reported on 09/03/2016) 30 capsule 0   No facility-administered medications prior to visit.     ROS Review of Systems  Constitutional: Negative for chills and fever.  HENT: Positive for ear pain. Negative for congestion.   Eyes: Negative for visual disturbance.  Respiratory: Negative for cough and shortness of breath.   Cardiovascular: Negative for chest pain.  Gastrointestinal: Negative for abdominal pain and blood in stool.  Musculoskeletal: Positive for arthralgias, back pain and neck pain.  Skin: Negative for rash.  Allergic/Immunologic: Negative for immunocompromised state.  Neurological: Positive for dizziness.  Hematological: Negative for adenopathy. Does not bruise/bleed easily.  Psychiatric/Behavioral: Negative for dysphoric mood and suicidal  ideas.    Objective:  BP 129/85 (BP Location: Left Arm, Patient Position: Sitting, Cuff Size: Large)   Pulse 76   Temp 98.2 F (36.8 C) (Oral)   Ht 5\' 2"  (1.575 m)   Wt 262 lb 9.6 oz (119.1 kg)   LMP 09/01/2013   SpO2 96%   BMI 48.03 kg/m   BP/Weight 12/03/2016 09/03/2016 [id number]  Systolic BP [id number] [id number]  Diastolic BP 85 85 89  Wt. (Lbs) 262.6 268.2 265.2  BMI 48.03 49.05 48.51   Physical Exam  Constitutional: She is oriented to person, place, and time. She appears well-developed and  well-nourished. No distress.  HENT:  Head: Normocephalic and atraumatic.    Right Ear: Tympanic membrane, external ear and ear canal normal.  Left Ear: Tympanic membrane, external ear and ear canal normal.  Nose: Mucosal edema present. Right sinus exhibits frontal sinus tenderness. Right sinus exhibits no maxillary sinus tenderness. Left sinus exhibits no maxillary sinus tenderness and no frontal sinus tenderness.  Mouth/Throat: Oropharynx is clear and moist.  Eyes: Conjunctivae are normal. Pupils are equal, round, and reactive to light.  Cardiovascular: Normal rate, regular rhythm, normal heart sounds and intact distal pulses.   Pulmonary/Chest: Effort normal and breath sounds normal.  Musculoskeletal: She exhibits no edema.  Lymphadenopathy:    She has no cervical adenopathy.  Neurological: She is alert and oriented to person, place, and time.  Skin: Skin is warm and dry. No rash noted.  Psychiatric: She has a normal mood and affect.   Lab Results  Component Value Date   HGBA1C 6.10 11/30/2015    Assessment & Plan:  There are no diagnoses linked to this encounter. Meds ordered this encounter  Medications  . predniSONE (DELTASONE) 10 MG tablet    Sig: Take by mouth 20 mg twice daily for 5 days, then 10 mg twice daily for 5 days, then 10 mg once daily for 5 days    Dispense:  35 tablet    Refill:  0  . amoxicillin-clavulanate (AUGMENTIN) 875-125 MG tablet    Sig: Take 1 tablet by mouth 2 (two) times daily. For 3 weeks    Dispense:  42 tablet    Refill:  0  . omeprazole (PRILOSEC) 20 MG capsule    Sig: Take 1 capsule (20 mg total) by mouth 2 (two) times daily before a meal.    Dispense:  30 capsule    Refill:  0    Follow-up: Return in about 6 weeks (around 01/14/2017) for chronic sinusitis .   Boykin Nearing MD

## 2016-12-31 ENCOUNTER — Emergency Department (HOSPITAL_COMMUNITY)
Admission: EM | Admit: 2016-12-31 | Discharge: 2016-12-31 | Disposition: A | Discharge status: Home / Self Care | Payer: Self-pay | Attending: Emergency Medicine | Admitting: Emergency Medicine

## 2016-12-31 ENCOUNTER — Encounter (HOSPITAL_COMMUNITY): Payer: Self-pay

## 2016-12-31 DIAGNOSIS — I10 Essential (primary) hypertension: Secondary | ICD-10-CM | POA: Insufficient documentation

## 2016-12-31 DIAGNOSIS — R220 Localized swelling, mass and lump, head: Secondary | ICD-10-CM | POA: Insufficient documentation

## 2016-12-31 DIAGNOSIS — Z79899 Other long term (current) drug therapy: Secondary | ICD-10-CM | POA: Insufficient documentation

## 2016-12-31 HISTORY — DX: Chronic sinusitis, unspecified: J32.9

## 2016-12-31 MED ORDER — NAPROXEN 375 MG PO TABS: 375.0000 mg | ORAL_TABLET | Freq: Two times a day (BID) | ORAL | 0 refills | Status: DC

## 2016-12-31 NOTE — ED Notes (Signed)
Pt ambulatory and independent at discharge.  Verbalized understanding of discharge instructions 

## 2016-12-31 NOTE — Discharge Instructions (Signed)
Please use the naproxen as prescribed. Do not take any extra ibuprofen, Motrin, Advil this medication. May take Tylenol. Warm compresses to the area. Please do the instructions listed below to increase saliva flow. Have given your referral to the ENT doctor. Follow up with her primary care doctor. Return to the ED if he develops any difficulties breathing, difficulty swallowing, fevers or for any other reason. Follow these instructions every few hours: Suck on a lemon candy to stimulate the flow of saliva. Put a warm compress over the gland. Gently massage the gland. Chew sugarless gum Drink enough fluid to keep your urine clear or pale yellow. Rinse your mouth with a mixture of warm water and salt every few hours. To make this mixture, add a pinch of salt to 1 cup of warm water. Practice good oral hygiene by brushing and flossing your teeth after meals and before you go to bed. Do not use any tobacco products, including cigarettes, chewing tobacco, or electronic cigarettes. If you need help quitting, ask your health care provider.

## 2016-12-31 NOTE — ED Notes (Signed)
ED Provider at bedside. 

## 2016-12-31 NOTE — ED Triage Notes (Signed)
Patient reports that she has swelling below both ears x 4days and painful when she moves her neck. Patient c.o tightness and tinglling to the left eye today. Patient denies any problems swallowing or breathing. Patient states she has been taking Ibuprofen for pain, but is not working.

## 2016-12-31 NOTE — ED Provider Notes (Signed)
[facility] DEPT Provider Note   CSN: [id number] Arrival date & time: 12/31/16  1335  By signing my name below, I, Michele Wood, attest that this documentation has been prepared under the direction and in the presence of Doristine Devoid, PA-C. Electronically Signed: Ludger Nutting, Scribe. 12/31/16. 3:29 PM.  History   Chief Complaint Chief Complaint  Patient presents with  . Facial Swelling    The history is provided by the patient. No language interpreter was used.     HPI Comments: Michele Wood is a 56 y.o. female who presents to the Emergency Department complaining of constant, unchanged left lateral jaw swelling with associated pain to the same area and left ear pain that began 4 days ago. She denies and aggravating or alleviating factors.Patient was recently treated for sinusitis approximately one week ago with antibiotics and prednisone. States that she has chronic sinus issues. She denies fever, nausea, vomiting, SOB, oral swelling, difficulty breathing, difficulty swallowing, short of breath.   Past Medical History:  Diagnosis Date  . Chronic back pain   . Hypertension    at age 21  . Obesity   . Sinusitis   . Sleep apnea    at age 25    Patient Active Problem List   Diagnosis Date Noted  . Chronic low back pain without sciatica 09/18/2015  . Lightheadedness 06/08/2015  . Chronic neck pain 06/08/2015  . Vitamin D insufficiency 04/25/2014  . Sinusitis, chronic 08/23/2013  . HTN (hypertension) 06/21/2008    Past Surgical History:  Procedure Laterality Date  . ABDOMINAL HYSTERECTOMY    . BREAST BIOPSY Right 07/02/2006   [place] Core benign    OB History    No data available       Home Medications    Prior to Admission medications   Medication Sig Start Date End Date Taking? Authorizing Provider  amLODipine (NORVASC) 10 MG tablet Take 1 tablet (10 mg total) by mouth daily. 08/22/16   Josalyn Funches, MD  amoxicillin-clavulanate (AUGMENTIN)  875-125 MG tablet Take 1 tablet by mouth 2 (two) times daily. For 3 weeks 12/03/16   Boykin Nearing, MD  Cholecalciferol (VITAMIN D3) 2000 UNITS TABS Take 2,000 Units by mouth daily. 09/19/15   Josalyn Funches, MD  cyclobenzaprine (FLEXERIL) 10 MG tablet TAKE 1 TABLET BY MOUTH 3 TIMES DAILY AS NEEDED FOR MUSCLE SPASMS 11/07/16   Boykin Nearing, MD  diclofenac (VOLTAREN) 75 MG EC tablet Take 1 tablet (75 mg total) by mouth 2 (two) times daily as needed for moderate pain (with food). 08/22/16   Josalyn Funches, MD  DULoxetine (CYMBALTA) 30 MG capsule Take 1 capsule (30 mg total) by mouth daily. 11/28/16   Josalyn Funches, MD  gabapentin (NEURONTIN) 300 MG capsule Take 2 capsules (600 mg total) by mouth at bedtime. 09/03/16   Josalyn Funches, MD  hydrochlorothiazide (HYDRODIURIL) 25 MG tablet Take 1 tablet (25 mg total) by mouth daily. 08/22/16   Josalyn Funches, MD  lisinopril (PRINIVIL,ZESTRIL) 40 MG tablet Take 1 tablet (40 mg total) by mouth daily. 08/22/16   Josalyn Funches, MD  loratadine (CLARITIN) 10 MG tablet Take 1 tablet (10 mg total) by mouth daily. 08/22/16   Josalyn Funches, MD  montelukast (SINGULAIR) 10 MG tablet Take 1 tablet (10 mg total) by mouth at bedtime. 08/22/16   Josalyn Funches, MD  omeprazole (PRILOSEC) 20 MG capsule Take 1 capsule (20 mg total) by mouth 2 (two) times daily before a meal. 12/03/16   Boykin Nearing, MD  predniSONE (DELTASONE)  10 MG tablet Take by mouth 20 mg twice daily for 5 days, then 10 mg twice daily for 5 days, then 10 mg once daily for 5 days 12/03/16   Boykin Nearing, MD  triamcinolone (NASACORT AQ) 55 MCG/ACT AERO nasal inhaler Place 2 sprays into the nose daily. 08/22/16   Boykin Nearing, MD    Family History Family History  Problem Relation Age of Onset  . Hypertension Mother   . Hypertension Other     Social History Social History  Substance Use Topics  . Smoking status: Never Smoker  . Smokeless tobacco: Never Used  . Alcohol use 0.0 oz/week      Comment: socially     Allergies   Milk-related compounds and Codeine sulfate   Review of Systems Review of Systems  Constitutional: Negative for fever.  HENT: Positive for ear pain. Negative for sore throat and trouble swallowing.        +lateral neck swelling and pain  Respiratory: Negative for shortness of breath.   Gastrointestinal: Negative for nausea and vomiting.     Physical Exam Updated Vital Signs BP (!) 129/91 (BP Location: Left Arm)   Pulse 76   Temp 98.7 F (37.1 C)   Resp 19   Ht 5\' 2"  (1.575 m)   Wt 117.9 kg   LMP 09/01/2013   SpO2 94%   BMI 47.55 kg/m   Physical Exam  Constitutional: She is oriented to person, place, and time. She appears well-developed and well-nourished.  HENT:  Head: Normocephalic and atraumatic.  Right Ear: Tympanic membrane, external ear and ear canal normal.  Left Ear: Tympanic membrane, external ear and ear canal normal.  Nose: Nose normal.  Mouth/Throat: Uvula is midline, oropharynx is clear and moist and mucous membranes are normal. No tonsillar exudate.  I do not appreciate any significant swelling of the left lateral jaw/neck area. There is no erythema, edema noted. Patient seems to have mild tenderness to palpation of the left submandibular lymph nodes in anterior cervical lymph nodes in the left. Do not appreciate any enlarged lymph nodes. Left ear with normal canal and TM. No pain with manipulation of the auricle or tragus. Patient able to fully open her mouth. Trismus. Oropharynx is clear without any edema concerning for prostatitis.  Eyes: Conjunctivae and EOM are normal. Pupils are equal, round, and reactive to light.  No swelling of the bilateral orbits. Conjunctivae are normal without any injection.  Neck: Normal range of motion. Neck supple.  Full range of motion. No nuchal rigidity.  Cardiovascular: Normal rate.   Pulmonary/Chest: Effort normal.  Neurological: She is alert and oriented to person, place, and time.    Skin: Skin is warm and dry. Capillary refill takes less than 2 seconds.  Psychiatric: She has a normal mood and affect.  Nursing note and vitals reviewed.    ED Treatments / Results  DIAGNOSTIC STUDIES: Oxygen Saturation is 96% on RA, adequate by my interpretation.    COORDINATION OF CARE: 3:27 PM Discussed treatment plan with pt at bedside and pt agreed to plan.   Labs (all labs ordered are listed, but only abnormal results are displayed) Labs Reviewed - No data to display  EKG  EKG Interpretation None       Radiology No results found.  Procedures Procedures (including critical care time)  Medications Ordered in ED Medications - No data to display   Initial Impression / Assessment and Plan / ED Course  I have reviewed the triage vital signs and  the nursing notes.  Pertinent labs & imaging results that were available during my care of the patient were reviewed by me and considered in my medical decision making (see chart for details).     Patient presents to the ED with complaints of subjective swelling to the left jaw area following antibiotics and prednisone treated for sinusitis. Mild tender palpation. Do not appreciate any significant swelling of the left side. There is no edema or erythema. Mild tenderness to palpation of the anterior cervical and sob mandibular lymph nodes. Likely enlarged lymph nodes that are tender due to viral illness. TMs are normal. No mastoid tenderness. Concerning for mastoiditis. Oropharynx is clear. Low suspicion for prostatitis. Patient has full range of motion of the neck and concerning for deep neck infection or meningitis. She is afebrile and not tachycardic. No difficulty swallowing or breathing. No swelling of the orbits concerning for periorbital or orbital cellulitis. EOMs are normal. Had Dr. Johnney Killian evaluated patient and she is on appreciate any significant edema. She recommends NSAIDs and increasing salivation. Have given follow-up  to ENT. Patient is agreeable with the above plan. Questions were answered prior to discharge. Dr. Johnney Killian saw and evaluated pt and is agreeable to the above plan. Have given strict return precautions.  Final Clinical Impressions(s) / ED Diagnoses   Final diagnoses:  Facial swelling    New Prescriptions Discharge Medication List as of 12/31/2016  3:46 PM    START taking these medications   Details  naproxen (NAPROSYN) 375 MG tablet Take 1 tablet (375 mg total) by mouth 2 (two) times daily., Starting Tue 12/31/2016, Print       I personally performed the services described in this documentation, which was scribed in my presence. The recorded information has been reviewed and is accurate.     Doristine Devoid, PA-C 01/01/17 [place], MD 01/01/17 [phone]

## 2017-01-14 ENCOUNTER — Telehealth: Payer: Self-pay | Admitting: Family Medicine

## 2017-01-14 DIAGNOSIS — J324 Chronic pansinusitis: Secondary | ICD-10-CM

## 2017-01-14 NOTE — Telephone Encounter (Signed)
CT sinus ordered Please call to schedule Please specify fusion protocol when scheduling

## 2017-01-14 NOTE — Telephone Encounter (Signed)
Employee of Dr. Rosina Lowenstein office called asking to speak with patient's PCP in regards to placing an order for a CT of sinus with fusion protocol and its to evaluate sinusitis. Dr. Lucia Gaskins would like to see her after the scan.   Thank you.

## 2017-01-15 NOTE — Telephone Encounter (Signed)
Pt was called and a VM was left informing pt of appointment for Ct scan on 01/23/17 @1 :[facility].

## 2017-01-16 ENCOUNTER — Encounter: Payer: Self-pay | Admitting: Family Medicine

## 2017-01-16 ENCOUNTER — Ambulatory Visit: Payer: Self-pay | Attending: Family Medicine | Admitting: Family Medicine

## 2017-01-16 VITALS — BP 133/84 | HR 84 | Temp 98.2°F | Wt 263.0 lb

## 2017-01-16 DIAGNOSIS — J329 Chronic sinusitis, unspecified: Secondary | ICD-10-CM | POA: Insufficient documentation

## 2017-01-16 DIAGNOSIS — M545 Low back pain: Secondary | ICD-10-CM | POA: Insufficient documentation

## 2017-01-16 DIAGNOSIS — G8929 Other chronic pain: Secondary | ICD-10-CM | POA: Insufficient documentation

## 2017-01-16 DIAGNOSIS — I10 Essential (primary) hypertension: Secondary | ICD-10-CM | POA: Insufficient documentation

## 2017-01-16 DIAGNOSIS — M542 Cervicalgia: Secondary | ICD-10-CM | POA: Insufficient documentation

## 2017-01-16 DIAGNOSIS — R51 Headache: Secondary | ICD-10-CM | POA: Insufficient documentation

## 2017-01-16 DIAGNOSIS — J324 Chronic pansinusitis: Secondary | ICD-10-CM | POA: Insufficient documentation

## 2017-01-16 MED ORDER — MELOXICAM 15 MG PO TABS: 15.0000 mg | ORAL_TABLET | Freq: Every day | ORAL | 0 refills | Status: DC

## 2017-01-16 NOTE — Assessment & Plan Note (Signed)
Established with ENT Scheduled for repeat CT sinus with fusion protocol

## 2017-01-16 NOTE — Patient Instructions (Addendum)
Waylon was seen today for sinusitis.  Diagnoses and all orders for this visit:  Chronic neck pain -     meloxicam (MOBIC) 15 MG tablet; Take 1 tablet (15 mg total) by mouth daily. -     MR Cervical Spine Wo Contrast; Future   f/u in 6 weeks for chronic neck pain  Dr. Adrian Blackwater

## 2017-01-16 NOTE — Assessment & Plan Note (Addendum)
chronic neck pain, worsening Known cervical spondylosis Plan: Add mobic MRI cervical spine Continue gabapentin and prn flexeril  MRI of cervical spine reveals progression of disc degeneration in neck  There is also spinal stenosis where a disc is narrowing the space for the spinal cord There is also mild neural foraminal stenosis where the disc narrow the space for the nerve roots Since this is worse, neurosurgery referral placed

## 2017-01-16 NOTE — Progress Notes (Signed)
Subjective:  Patient ID: Michele Wood, female    DOB: August 05, 1961  Age: 56 y.o. MRN: [id number]  CC: Sinusitis   HPI Austine Kelsay Deberry-Nylander has hx of chronic sinusitis, chronic neck pain, chronic low back pain and HTN she  presents for    1. HTN: compliant with current regimen.  No CP or SOB with exertion. No leg swelling. Having headache sometimes.   2. Chronic sinusitis: she completed 3 weeks of Augmentin and prednisone. She has established with ENT who has recommended repeat CT sinus with fusion protocol. She is having frontal headache. No fever or chills.   3. Chronic neck pain:  L sided neck pain has improved with prednisone. Worsening since stopping prednisone.  Taking gabapentin 600 mg nightly, naproxen or diclofenac and flexeril prn. Current pain level is 6/10.   Past Surgical History:  Procedure Laterality Date  . ABDOMINAL HYSTERECTOMY    . BREAST BIOPSY Right 07/02/2006   [place] Core benign    Social History  Substance Use Topics  . Smoking status: Never Smoker  . Smokeless tobacco: Never Used  . Alcohol use 0.0 oz/week     Comment: socially    Outpatient Medications Prior to Visit  Medication Sig Dispense Refill  . amLODipine (NORVASC) 10 MG tablet Take 1 tablet (10 mg total) by mouth daily. 30 tablet 11  . amoxicillin-clavulanate (AUGMENTIN) 875-125 MG tablet Take 1 tablet by mouth 2 (two) times daily. For 3 weeks 42 tablet 0  . Cholecalciferol (VITAMIN D3) 2000 UNITS TABS Take 2,000 Units by mouth daily. 30 tablet 11  . cyclobenzaprine (FLEXERIL) 10 MG tablet TAKE 1 TABLET BY MOUTH 3 TIMES DAILY AS NEEDED FOR MUSCLE SPASMS 60 tablet 0  . diclofenac (VOLTAREN) 75 MG EC tablet Take 1 tablet (75 mg total) by mouth 2 (two) times daily as needed for moderate pain (with food). 60 tablet 2  . DULoxetine (CYMBALTA) 30 MG capsule Take 1 capsule (30 mg total) by mouth daily. 90 capsule 3  . gabapentin (NEURONTIN) 300 MG capsule Take 2 capsules (600 mg total)  by mouth at bedtime. 60 capsule 5  . hydrochlorothiazide (HYDRODIURIL) 25 MG tablet Take 1 tablet (25 mg total) by mouth daily. 30 tablet 11  . lisinopril (PRINIVIL,ZESTRIL) 40 MG tablet Take 1 tablet (40 mg total) by mouth daily. 30 tablet 11  . loratadine (CLARITIN) 10 MG tablet Take 1 tablet (10 mg total) by mouth daily. 30 tablet 11  . montelukast (SINGULAIR) 10 MG tablet Take 1 tablet (10 mg total) by mouth at bedtime. 30 tablet 3  . naproxen (NAPROSYN) 375 MG tablet Take 1 tablet (375 mg total) by mouth 2 (two) times daily. 14 tablet 0  . omeprazole (PRILOSEC) 20 MG capsule Take 1 capsule (20 mg total) by mouth 2 (two) times daily before a meal. 30 capsule 0  . predniSONE (DELTASONE) 10 MG tablet Take by mouth 20 mg twice daily for 5 days, then 10 mg twice daily for 5 days, then 10 mg once daily for 5 days 35 tablet 0  . triamcinolone (NASACORT AQ) 55 MCG/ACT AERO nasal inhaler Place 2 sprays into the nose daily. 1 Inhaler 12   No facility-administered medications prior to visit.     ROS Review of Systems  Constitutional: Negative for chills and fever.  HENT: Positive for ear pain. Negative for congestion.   Eyes: Negative for visual disturbance.  Respiratory: Negative for cough and shortness of breath.   Cardiovascular: Negative for chest pain.  Gastrointestinal: Negative for abdominal pain and blood in stool.  Musculoskeletal: Positive for arthralgias, back pain and neck pain.  Skin: Negative for rash.  Allergic/Immunologic: Negative for immunocompromised state.  Neurological: Positive for dizziness.  Hematological: Negative for adenopathy. Does not bruise/bleed easily.  Psychiatric/Behavioral: Negative for dysphoric mood and suicidal ideas.    Objective:  BP 133/84   Pulse 84   Temp 98.2 F (36.8 C) (Oral)   Wt 263 lb (119.3 kg)   LMP 09/01/2013   SpO2 97%   BMI 48.10 kg/m   BP/Weight 01/16/2017 12/31/2016 1/61/0960  Systolic BP 454 098 119  Diastolic BP 84 91 85  Wt.  (Lbs) 263 260 262.6  BMI 48.1 47.55 48.03   Physical Exam  Constitutional: She is oriented to person, place, and time. She appears well-developed and well-nourished. No distress.  HENT:  Head: Normocephalic and atraumatic.    Right Ear: Tympanic membrane, external ear and ear canal normal.  Left Ear: Tympanic membrane, external ear and ear canal normal.  Nose: Right sinus exhibits no maxillary sinus tenderness. Left sinus exhibits no maxillary sinus tenderness and no frontal sinus tenderness.  Mouth/Throat: Oropharynx is clear and moist.  Eyes: Conjunctivae are normal. Pupils are equal, round, and reactive to light.  Cardiovascular: Normal rate, regular rhythm, normal heart sounds and intact distal pulses.   Pulmonary/Chest: Effort normal and breath sounds normal.  Musculoskeletal: She exhibits no edema.  Lymphadenopathy:    She has no cervical adenopathy.  Neurological: She is alert and oriented to person, place, and time.  Skin: Skin is warm and dry. No rash noted.  Psychiatric: She has a normal mood and affect.   Lab Results  Component Value Date   HGBA1C 6.10 11/30/2015    Assessment & Plan:  Amesha was seen today for sinusitis.  Diagnoses and all orders for this visit:  Chronic neck pain -     meloxicam (MOBIC) 15 MG tablet; Take 1 tablet (15 mg total) by mouth daily. -     MR Cervical Spine Wo Contrast; Future  Chronic pansinusitis   No orders of the defined types were placed in this encounter.   Follow-up: Return in about 6 weeks (around 02/27/2017) for neck pain .   Boykin Nearing MD

## 2017-01-20 ENCOUNTER — Ambulatory Visit: Payer: Self-pay | Attending: Family Medicine

## 2017-01-23 ENCOUNTER — Ambulatory Visit (HOSPITAL_COMMUNITY)
Admission: RE | Admit: 2017-01-23 | Discharge: 2017-01-23 | Disposition: A | Discharge status: Home / Self Care | Payer: Self-pay | Source: Ambulatory Visit | Attending: Family Medicine | Admitting: Family Medicine

## 2017-01-23 DIAGNOSIS — J324 Chronic pansinusitis: Secondary | ICD-10-CM | POA: Insufficient documentation

## 2017-01-23 DIAGNOSIS — Z9889 Other specified postprocedural states: Secondary | ICD-10-CM | POA: Insufficient documentation

## 2017-01-24 ENCOUNTER — Ambulatory Visit (HOSPITAL_COMMUNITY)
Admission: RE | Admit: 2017-01-24 | Discharge: 2017-01-24 | Disposition: A | Discharge status: Home / Self Care | Payer: Self-pay | Source: Ambulatory Visit | Attending: Family Medicine | Admitting: Family Medicine

## 2017-01-24 DIAGNOSIS — M5020 Other cervical disc displacement, unspecified cervical region: Secondary | ICD-10-CM | POA: Insufficient documentation

## 2017-01-24 DIAGNOSIS — M5033 Other cervical disc degeneration, cervicothoracic region: Secondary | ICD-10-CM | POA: Insufficient documentation

## 2017-01-24 DIAGNOSIS — G8929 Other chronic pain: Secondary | ICD-10-CM | POA: Insufficient documentation

## 2017-01-24 DIAGNOSIS — M542 Cervicalgia: Secondary | ICD-10-CM

## 2017-01-24 DIAGNOSIS — M4802 Spinal stenosis, cervical region: Secondary | ICD-10-CM | POA: Insufficient documentation

## 2017-01-27 ENCOUNTER — Encounter: Payer: Self-pay | Admitting: Family Medicine

## 2017-02-05 ENCOUNTER — Telehealth: Payer: Self-pay

## 2017-02-05 NOTE — Telephone Encounter (Signed)
Pt was called and informed of lab results. 

## 2017-02-17 ENCOUNTER — Ambulatory Visit: Payer: Self-pay | Attending: Family Medicine

## 2017-02-20 NOTE — Addendum Note (Signed)
Addended by: Boykin Nearing on: 02/20/2017 02:46 PM   Modules accepted: Orders

## 2017-02-26 ENCOUNTER — Encounter: Payer: Self-pay | Admitting: Family Medicine

## 2017-02-26 NOTE — Progress Notes (Addendum)
Subjective:  Patient ID: Michele Wood, female    DOB: [birth year]  Age: 56 y.o. MRN: [id number]  CC: Neck Pain   HPI Michele Wood has hx of chronic sinusitis, chronic neck pain, chronic low back pain and HTN she  presents for    1. HTN: compliant with current regimen.  No CP or SOB with exertion. No leg swelling. Having headache sometimes.   2. Chronic sinusitis: she completed 3 weeks of Augmentin and prednisone. She has established with ENT who has recommended repeat CT sinus with fusion protocol. She is having frontal headache. No fever or chills.  CT maxillofacial with sinus, fusion protocol 01/23/17: IMPRESSION: Extensive previous sinus surgery with ethmoidectomy, middle turbinate resection and wide communication of the maxillary and sphenoid sinuses with the ethmoid and nasal passages. Widespread chronic mucoperiosteal thickening. Right frontal sinus is either chronically underdeveloped or chronically filled with partially calcified material. Similar appearance with compared to the study of 2012. Opacified lucency at the frontal ethmoid junction in the midline presumed to represent a chronically inflamed airspace. Opacified air cell versus polyp at the frontal ethmoid junction region on the left.  She has followed up with ENT who recommended watchful waiting. She would like a second opinion.   3. Chronic neck pain:  L sided neck pain. Has responded in the past to prednisone. Worsening since stopping prednisone.  Taking gabapentin 600 mg nightly, naproxen or diclofenac and flexeril prn. Current pain level is 6/10. Pain is worse when raising her L arm. She reports sleeping is good with gabapentin.  She feels like Cymbalta 30 mg daily is helping. When she takes the meloxicam at bedtime without improvement in pain. She has been referred to neurosurgery but is uninsured and cannot afford the $150-$200 co-pay.   MRI cervical spine 01/24/17: IMPRESSION: 1. Progressed  disc degeneration since 2014 at C7-T1 which may be the symptomatic level. Moderate size leftward disc herniation air with new spinal stenosis, mild spinal cord mass effect, and mild left C8 foraminal stenosis. No associated spinal cord signal abnormality.  2. Chronic but mildly progressed disc degeneration also at C5-C6 and C6-C7. Up to mild spinal stenosis with no spinal cord mass effect. Moderate left C6 and mild bilateral C7 neural foraminal stenosis.  Past Surgical History:  Procedure Laterality Date  . ABDOMINAL HYSTERECTOMY    . BREAST BIOPSY Right 07/02/2006   [place] Core benign    Social History  Substance Use Topics  . Smoking status: Never Smoker  . Smokeless tobacco: Never Used  . Alcohol use 0.0 oz/week     Comment: socially    Outpatient Medications Prior to Visit  Medication Sig Dispense Refill  . amLODipine (NORVASC) 10 MG tablet Take 1 tablet (10 mg total) by mouth daily. 30 tablet 11  . Cholecalciferol (VITAMIN D3) 2000 UNITS TABS Take 2,000 Units by mouth daily. 30 tablet 11  . cyclobenzaprine (FLEXERIL) 10 MG tablet TAKE 1 TABLET BY MOUTH 3 TIMES DAILY AS NEEDED FOR MUSCLE SPASMS 60 tablet 0  . DULoxetine (CYMBALTA) 30 MG capsule Take 1 capsule (30 mg total) by mouth daily. 90 capsule 3  . gabapentin (NEURONTIN) 300 MG capsule Take 2 capsules (600 mg total) by mouth at bedtime. 60 capsule 5  . hydrochlorothiazide (HYDRODIURIL) 25 MG tablet Take 1 tablet (25 mg total) by mouth daily. 30 tablet 11  . lisinopril (PRINIVIL,ZESTRIL) 40 MG tablet Take 1 tablet (40 mg total) by mouth daily. 30 tablet 11  . loratadine (CLARITIN) 10 MG  tablet Take 1 tablet (10 mg total) by mouth daily. 30 tablet 11  . meloxicam (MOBIC) 15 MG tablet Take 1 tablet (15 mg total) by mouth daily. 30 tablet 0  . montelukast (SINGULAIR) 10 MG tablet Take 1 tablet (10 mg total) by mouth at bedtime. 30 tablet 3  . omeprazole (PRILOSEC) 20 MG capsule Take 1 capsule (20 mg total) by mouth 2 (two) times  daily before a meal. 30 capsule 0  . triamcinolone (NASACORT AQ) 55 MCG/ACT AERO nasal inhaler Place 2 sprays into the nose daily. 1 Inhaler 12   No facility-administered medications prior to visit.     ROS Review of Systems  Constitutional: Positive for chills. Negative for fever.  HENT: Positive for sinus pressure. Negative for congestion and ear pain.   Eyes: Negative for visual disturbance.  Respiratory: Negative for cough and shortness of breath.   Cardiovascular: Negative for chest pain.  Gastrointestinal: Negative for abdominal pain and blood in stool.  Musculoskeletal: Positive for arthralgias, back pain and neck pain.  Skin: Negative for rash.  Allergic/Immunologic: Negative for immunocompromised state.  Neurological: Positive for dizziness.  Hematological: Negative for adenopathy. Does not bruise/bleed easily.  Psychiatric/Behavioral: Negative for dysphoric mood and suicidal ideas.    Objective:  BP 125/85   Pulse 75   Temp 97.9 F (36.6 C) (Oral)   Wt 262 lb 6.4 oz (119 kg)   LMP 09/01/2013   SpO2 98%   BMI 47.99 kg/m   BP/Weight 01/16/2017 12/31/2016 3/42/8768  Systolic BP 115 726 203  Diastolic BP 84 91 85  Wt. (Lbs) 263 260 262.6  BMI 48.1 47.55 48.03   Physical Exam  Constitutional: She is oriented to person, place, and time. She appears well-developed and well-nourished. No distress.  HENT:  Head: Normocephalic and atraumatic.    Right Ear: Tympanic membrane, external ear and ear canal normal.  Left Ear: Tympanic membrane, external ear and ear canal normal.  Nose: Right sinus exhibits no maxillary sinus tenderness. Left sinus exhibits no maxillary sinus tenderness and no frontal sinus tenderness.  Mouth/Throat: Oropharynx is clear and moist.  Eyes: Conjunctivae are normal. Pupils are equal, round, and reactive to light.  Cardiovascular: Normal rate, regular rhythm, normal heart sounds and intact distal pulses.   Pulmonary/Chest: Effort normal and breath  sounds normal.  Musculoskeletal: She exhibits no edema.       Left shoulder: She exhibits decreased range of motion, pain, abnormal pulse and decreased strength.  Lymphadenopathy:    She has no cervical adenopathy.  Neurological: She is alert and oriented to person, place, and time.  Skin: Skin is warm and dry. No rash noted.  Psychiatric: She has a normal mood and affect.   Lab Results  Component Value Date   HGBA1C 6.10 11/30/2015    Assessment & Plan:  Michele Wood was seen today for neck pain.  Diagnoses and all orders for this visit:  Chronic ethmoidal sinusitis -     Ambulatory referral to ENT  Musculoskeletal pain -     DULoxetine (CYMBALTA) 60 MG capsule; Take 1 capsule (60 mg total) by mouth daily. -     gabapentin (NEURONTIN) 300 MG capsule; Take 2 capsules (600 mg total) by mouth at bedtime. -     pregabalin (LYRICA) 50 MG capsule; Take 1 capsule (50 mg total) by mouth 3 (three) times daily. For PASS  Chronic neck pain -     meloxicam (MOBIC) 15 MG tablet; Take 1 tablet (15 mg total) by mouth  daily.  Healthcare maintenance -     Ambulatory referral to Gastroenterology   Meds ordered this encounter  Medications  . DULoxetine (CYMBALTA) 60 MG capsule    Sig: Take 1 capsule (60 mg total) by mouth daily.    Dispense:  30 capsule    Refill:  5  . gabapentin (NEURONTIN) 300 MG capsule    Sig: Take 2 capsules (600 mg total) by mouth at bedtime.    Dispense:  60 capsule    Refill:  5  . pregabalin (LYRICA) 50 MG capsule    Sig: Take 1 capsule (50 mg total) by mouth 3 (three) times daily. For PASS    Dispense:  90 capsule    Refill:  2  . meloxicam (MOBIC) 15 MG tablet    Sig: Take 1 tablet (15 mg total) by mouth daily.    Dispense:  30 tablet    Refill:  0    Follow-up: Return in about 6 weeks (around 04/10/2017) for chronic neck pain .   Boykin Nearing MD

## 2017-02-27 ENCOUNTER — Ambulatory Visit: Payer: Self-pay | Attending: Family Medicine | Admitting: Family Medicine

## 2017-02-27 ENCOUNTER — Encounter: Payer: Self-pay | Admitting: Family Medicine

## 2017-02-27 VITALS — BP 125/85 | HR 75 | Temp 97.9°F | Wt 262.4 lb

## 2017-02-27 DIAGNOSIS — G8929 Other chronic pain: Secondary | ICD-10-CM | POA: Insufficient documentation

## 2017-02-27 DIAGNOSIS — I10 Essential (primary) hypertension: Secondary | ICD-10-CM | POA: Insufficient documentation

## 2017-02-27 DIAGNOSIS — Z0000 Encounter for general adult medical examination without abnormal findings: Secondary | ICD-10-CM

## 2017-02-27 DIAGNOSIS — M791 Myalgia: Secondary | ICD-10-CM | POA: Insufficient documentation

## 2017-02-27 DIAGNOSIS — J322 Chronic ethmoidal sinusitis: Secondary | ICD-10-CM | POA: Insufficient documentation

## 2017-02-27 DIAGNOSIS — M7918 Myalgia, other site: Secondary | ICD-10-CM

## 2017-02-27 DIAGNOSIS — M545 Low back pain: Secondary | ICD-10-CM | POA: Insufficient documentation

## 2017-02-27 DIAGNOSIS — Z79899 Other long term (current) drug therapy: Secondary | ICD-10-CM | POA: Insufficient documentation

## 2017-02-27 DIAGNOSIS — M542 Cervicalgia: Secondary | ICD-10-CM | POA: Insufficient documentation

## 2017-02-27 MED ORDER — GABAPENTIN 300 MG PO CAPS: 600.0000 mg | ORAL_CAPSULE | Freq: Every day | ORAL | 5 refills | Status: DC

## 2017-02-27 MED ORDER — PREGABALIN 50 MG PO CAPS: 50.0000 mg | ORAL_CAPSULE | Freq: Three times a day (TID) | ORAL | 2 refills | Status: DC

## 2017-02-27 MED ORDER — DULOXETINE HCL 60 MG PO CPEP: 60.0000 mg | ORAL_CAPSULE | Freq: Every day | ORAL | 5 refills | Status: DC

## 2017-02-27 MED ORDER — CYCLOBENZAPRINE HCL 10 MG PO TABS: ORAL_TABLET | ORAL | 0 refills | Status: DC

## 2017-02-27 MED ORDER — MELOXICAM 15 MG PO TABS: 15.0000 mg | ORAL_TABLET | Freq: Every day | ORAL | 0 refills | Status: DC

## 2017-02-27 NOTE — Assessment & Plan Note (Signed)
chronic neck pain, worsening MRI revealed progression of disc degeneration  Known cervical spondylosis Plan: Continue mobic Continue gabapentin and prn robaxin Increase cymbalta to 60 mg daily Transition for gabapentin to lyrica. Patient to start patient assistance for lyrica

## 2017-02-27 NOTE — Assessment & Plan Note (Signed)
Well controlled Compliant with regimen Continue current regimen 

## 2017-02-27 NOTE — Assessment & Plan Note (Signed)
Another ENT referral for second opinion

## 2017-02-27 NOTE — Addendum Note (Signed)
Addended by: Boykin Nearing on: 02/27/2017 01:21 PM   Modules accepted: Orders

## 2017-02-27 NOTE — Patient Instructions (Addendum)
Vonne was seen today for neck pain.  Diagnoses and all orders for this visit:  Chronic ethmoidal sinusitis -     Ambulatory referral to ENT  Musculoskeletal pain -     DULoxetine (CYMBALTA) 60 MG capsule; Take 1 capsule (60 mg total) by mouth daily. -     gabapentin (NEURONTIN) 300 MG capsule; Take 2 capsules (600 mg total) by mouth at bedtime. -     pregabalin (LYRICA) 50 MG capsule; Take 1 capsule (50 mg total) by mouth 3 (three) times daily. For PASS  Chronic neck pain -     meloxicam (MOBIC) 15 MG tablet; Take 1 tablet (15 mg total) by mouth daily.  Healthcare maintenance -     Ambulatory referral to Gastroenterology    F/u in 6 weeks for chronic neck pain   Dr. Adrian Blackwater

## 2017-03-05 ENCOUNTER — Telehealth: Payer: Self-pay

## 2017-03-05 NOTE — Telephone Encounter (Signed)
Pt was called and informed of lab results. 

## 2017-03-11 ENCOUNTER — Other Ambulatory Visit: Payer: Self-pay | Admitting: Family Medicine

## 2017-03-11 DIAGNOSIS — M542 Cervicalgia: Principal | ICD-10-CM

## 2017-03-11 DIAGNOSIS — G8929 Other chronic pain: Secondary | ICD-10-CM

## 2017-03-14 ENCOUNTER — Ambulatory Visit: Payer: Self-pay | Attending: Family Medicine

## 2017-03-24 ENCOUNTER — Other Ambulatory Visit: Payer: Self-pay

## 2017-03-24 ENCOUNTER — Ambulatory Visit (INDEPENDENT_AMBULATORY_CARE_PROVIDER_SITE_OTHER): Payer: Self-pay | Admitting: Otolaryngology

## 2017-03-24 DIAGNOSIS — J321 Chronic frontal sinusitis: Secondary | ICD-10-CM

## 2017-03-24 DIAGNOSIS — J320 Chronic maxillary sinusitis: Secondary | ICD-10-CM

## 2017-03-24 DIAGNOSIS — M7918 Myalgia, other site: Secondary | ICD-10-CM

## 2017-03-24 DIAGNOSIS — H6123 Impacted cerumen, bilateral: Secondary | ICD-10-CM

## 2017-03-24 DIAGNOSIS — J322 Chronic ethmoidal sinusitis: Secondary | ICD-10-CM

## 2017-03-24 MED ORDER — PREGABALIN 50 MG PO CAPS: 50.0000 mg | ORAL_CAPSULE | Freq: Three times a day (TID) | ORAL | 3 refills | Status: DC

## 2017-04-10 ENCOUNTER — Encounter: Payer: Self-pay | Admitting: Family Medicine

## 2017-04-10 ENCOUNTER — Ambulatory Visit: Payer: Self-pay | Attending: Family Medicine | Admitting: Family Medicine

## 2017-04-10 VITALS — BP 121/84 | HR 86 | Temp 98.1°F | Wt 259.2 lb

## 2017-04-10 DIAGNOSIS — M542 Cervicalgia: Secondary | ICD-10-CM

## 2017-04-10 DIAGNOSIS — M7989 Other specified soft tissue disorders: Secondary | ICD-10-CM | POA: Insufficient documentation

## 2017-04-10 DIAGNOSIS — M4802 Spinal stenosis, cervical region: Secondary | ICD-10-CM | POA: Insufficient documentation

## 2017-04-10 DIAGNOSIS — M79672 Pain in left foot: Secondary | ICD-10-CM | POA: Insufficient documentation

## 2017-04-10 DIAGNOSIS — I10 Essential (primary) hypertension: Secondary | ICD-10-CM | POA: Insufficient documentation

## 2017-04-10 DIAGNOSIS — M545 Low back pain: Secondary | ICD-10-CM | POA: Insufficient documentation

## 2017-04-10 DIAGNOSIS — G8929 Other chronic pain: Secondary | ICD-10-CM

## 2017-04-10 NOTE — Patient Instructions (Addendum)
Judit was seen today for neck pain.  Diagnoses and all orders for this visit:  Chronic neck pain   Please continue all current medictions You will get the 60 mg cymbalta today  Please follow up in 3 weeks to reassess symptoms on the medication  Dr. Adrian Blackwater

## 2017-04-10 NOTE — Assessment & Plan Note (Signed)
chronic neck pain, worsening MRI revealed progression of disc degeneration  Known cervical spondylosis Plan: Continue mobic Continue gabapentin and prn robaxin Increase cymbalta to 60 mg daily patient will start today  Transition from gabapentin to lyrica.  Patient to start patient assistance for lyrica

## 2017-04-10 NOTE — Progress Notes (Addendum)
Subjective:  Patient ID: Michele Wood, female    DOB: May 16, 1961  Age: 56 y.o. MRN: [id number]  CC: Neck Pain   HPI Michele Wood has hx of chronic sinusitis, chronic neck pain, chronic low back pain and HTN she  presents for   1. Chronic neck pain:  L sided neck pain. Has responded in the past to prednisone. Worsening since stopping prednisone.  Taking gabapentin 600 mg nightly, naproxen or diclofenac and flexeril prn. Current pain level is 6/10. Pain is worse when raising her L arm. She reports sleeping is good with gabapentin.  She feels like Cymbalta 30 mg daily is helping. When she takes the meloxicam at bedtime without improvement in pain. She has been referred to neurosurgery but is uninsured and cannot afford the $150-$200 co-pay.   Today, she reports not having Cymbalta for one month due to receiving it via PASS and the change in dose.  She reports worsening pain in her neck nad back. She has pain and swelling in her L foot.   MRI cervical spine 01/24/17: IMPRESSION: 1. Progressed disc degeneration since 2014 at C7-T1 which may be the symptomatic level. Moderate size leftward disc herniation air with new spinal stenosis, mild spinal cord mass effect, and mild left C8 foraminal stenosis. No associated spinal cord signal abnormality.  2. Chronic but mildly progressed disc degeneration also at C5-C6 and C6-C7. Up to mild spinal stenosis with no spinal cord mass effect. Moderate left C6 and mild bilateral C7 neural foraminal stenosis.  Past Surgical History:  Procedure Laterality Date  . ABDOMINAL HYSTERECTOMY    . BREAST BIOPSY Right 07/02/2006   [place] Core benign    Social History  Substance Use Topics  . Smoking status: Never Smoker  . Smokeless tobacco: Never Used  . Alcohol use 0.0 oz/week     Comment: socially    Outpatient Medications Prior to Visit  Medication Sig Dispense Refill  . amLODipine (NORVASC) 10 MG tablet Take 1 tablet (10 mg total)  by mouth daily. 30 tablet 11  . Cholecalciferol (VITAMIN D3) 2000 UNITS TABS Take 2,000 Units by mouth daily. 30 tablet 11  . cyclobenzaprine (FLEXERIL) 10 MG tablet TAKE 1 TABLET BY MOUTH 3 TIMES DAILY AS NEEDED FOR MUSCLE SPASMS 60 tablet 0  . DULoxetine (CYMBALTA) 60 MG capsule Take 1 capsule (60 mg total) by mouth daily. 30 capsule 5  . gabapentin (NEURONTIN) 300 MG capsule Take 2 capsules (600 mg total) by mouth at bedtime. 60 capsule 5  . hydrochlorothiazide (HYDRODIURIL) 25 MG tablet Take 1 tablet (25 mg total) by mouth daily. 30 tablet 11  . lisinopril (PRINIVIL,ZESTRIL) 40 MG tablet Take 1 tablet (40 mg total) by mouth daily. 30 tablet 11  . loratadine (CLARITIN) 10 MG tablet Take 1 tablet (10 mg total) by mouth daily. 30 tablet 11  . meloxicam (MOBIC) 15 MG tablet TAKE 1 TABLET BY MOUTH DAILY. 30 tablet 0  . montelukast (SINGULAIR) 10 MG tablet Take 1 tablet (10 mg total) by mouth at bedtime. 30 tablet 3  . omeprazole (PRILOSEC) 20 MG capsule Take 1 capsule (20 mg total) by mouth 2 (two) times daily before a meal. 30 capsule 0  . pregabalin (LYRICA) 50 MG capsule Take 1 capsule (50 mg total) by mouth 3 (three) times daily. For PASS 270 capsule 3  . triamcinolone (NASACORT AQ) 55 MCG/ACT AERO nasal inhaler Place 2 sprays into the nose daily. 1 Inhaler 12   No facility-administered medications prior to  visit.     ROS Review of Systems  Constitutional: Positive for chills. Negative for fever.  HENT: Positive for sinus pressure. Negative for congestion and ear pain.   Eyes: Negative for visual disturbance.  Respiratory: Negative for cough and shortness of breath.   Cardiovascular: Negative for chest pain.  Gastrointestinal: Negative for abdominal pain and blood in stool.  Musculoskeletal: Positive for arthralgias, back pain and neck pain.  Skin: Negative for rash.  Allergic/Immunologic: Negative for immunocompromised state.  Neurological: Positive for dizziness.  Hematological:  Negative for adenopathy. Does not bruise/bleed easily.  Psychiatric/Behavioral: Negative for dysphoric mood and suicidal ideas.    Objective:  BP 121/84   Pulse 86   Temp 98.1 F (36.7 C) (Oral)   Wt 259 lb 3.2 oz (117.6 kg)   LMP 09/01/2013   SpO2 97%   BMI 47.41 kg/m   BP/Weight 04/10/2017 11/05/4823 0/0/3704  Systolic BP 888 916 945  Diastolic BP 84 85 84  Wt. (Lbs) 259.2 262.4 263  BMI 47.41 47.99 48.1   Physical Exam  Constitutional: She is oriented to person, place, and time. She appears well-developed and well-nourished. No distress.  HENT:  Head: Normocephalic and atraumatic.    Right Ear: Tympanic membrane, external ear and ear canal normal.  Left Ear: Tympanic membrane, external ear and ear canal normal.  Nose: Right sinus exhibits no maxillary sinus tenderness. Left sinus exhibits no maxillary sinus tenderness and no frontal sinus tenderness.  Mouth/Throat: Oropharynx is clear and moist.  Eyes: Conjunctivae are normal. Pupils are equal, round, and reactive to light.  Cardiovascular: Normal rate, regular rhythm, normal heart sounds and intact distal pulses.   Pulmonary/Chest: Effort normal and breath sounds normal.  Musculoskeletal: She exhibits no edema.       Left shoulder: She exhibits decreased range of motion, pain, abnormal pulse and decreased strength.       Left foot: There is swelling.  Lymphadenopathy:    She has no cervical adenopathy.  Neurological: She is alert and oriented to person, place, and time.  Skin: Skin is warm and dry. No rash noted.  Psychiatric: She has a normal mood and affect.   Lab Results  Component Value Date   HGBA1C 6.10 11/30/2015    Assessment & Plan:  [name] was seen today for neck pain.  Diagnoses and all orders for this visit:  Chronic neck pain   No orders of the defined types were placed in this encounter.   Follow-up: Return in about 3 weeks (around 05/01/2017) for chronic neck pain .   Michele Nearing  MD

## 2017-04-10 NOTE — Progress Notes (Signed)
Pt is having pain in feet and swelling.

## 2017-05-01 ENCOUNTER — Encounter: Payer: Self-pay | Admitting: Family Medicine

## 2017-05-01 ENCOUNTER — Ambulatory Visit: Payer: Self-pay | Attending: Family Medicine | Admitting: Family Medicine

## 2017-05-01 VITALS — BP 115/79 | HR 88 | Temp 97.9°F | Wt 260.4 lb

## 2017-05-01 DIAGNOSIS — M25552 Pain in left hip: Secondary | ICD-10-CM | POA: Insufficient documentation

## 2017-05-01 DIAGNOSIS — M533 Sacrococcygeal disorders, not elsewhere classified: Secondary | ICD-10-CM

## 2017-05-01 DIAGNOSIS — S46811A Strain of other muscles, fascia and tendons at shoulder and upper arm level, right arm, initial encounter: Secondary | ICD-10-CM | POA: Insufficient documentation

## 2017-05-01 DIAGNOSIS — Z90710 Acquired absence of both cervix and uterus: Secondary | ICD-10-CM | POA: Insufficient documentation

## 2017-05-01 DIAGNOSIS — Z9889 Other specified postprocedural states: Secondary | ICD-10-CM | POA: Insufficient documentation

## 2017-05-01 DIAGNOSIS — M25511 Pain in right shoulder: Secondary | ICD-10-CM | POA: Insufficient documentation

## 2017-05-01 DIAGNOSIS — G8929 Other chronic pain: Secondary | ICD-10-CM | POA: Insufficient documentation

## 2017-05-01 DIAGNOSIS — M542 Cervicalgia: Secondary | ICD-10-CM | POA: Insufficient documentation

## 2017-05-01 DIAGNOSIS — I10 Essential (primary) hypertension: Secondary | ICD-10-CM | POA: Insufficient documentation

## 2017-05-01 DIAGNOSIS — M4802 Spinal stenosis, cervical region: Secondary | ICD-10-CM | POA: Insufficient documentation

## 2017-05-01 DIAGNOSIS — S46811D Strain of other muscles, fascia and tendons at shoulder and upper arm level, right arm, subsequent encounter: Secondary | ICD-10-CM

## 2017-05-01 DIAGNOSIS — M5124 Other intervertebral disc displacement, thoracic region: Secondary | ICD-10-CM | POA: Insufficient documentation

## 2017-05-01 DIAGNOSIS — Z79899 Other long term (current) drug therapy: Secondary | ICD-10-CM | POA: Insufficient documentation

## 2017-05-01 MED ORDER — PREDNISONE 10 MG PO TABS: ORAL_TABLET | ORAL | 0 refills | Status: DC

## 2017-05-01 NOTE — Assessment & Plan Note (Signed)
Known cervical DJD Prednisone taper  continue nightly flexeril

## 2017-05-01 NOTE — Patient Instructions (Addendum)
Michele Wood was seen today for neck pain.  Diagnoses and all orders for this visit:  Degenerative cervical spinal stenosis  Strain of right supraspinatus muscle, subsequent encounter -     predniSONE (DELTASONE) 10 MG tablet; Take by mouth 4 tablets for 1 day, 3 tablets for 3 days, 2 tablets for 2 days, 1 tablet for 1 days then STOP -     Ambulatory referral to Orthopedic Surgery  Pain of left sacroiliac joint -     predniSONE (DELTASONE) 10 MG tablet; Take by mouth 4 tablets for 1 day, 3 tablets for 3 days, 2 tablets for 2 days, 1 tablet for 1 days then STOP -     Ambulatory referral to Orthopedic Surgery   Prednisone taper for flared up pain Orthopaedic referral placed Once you have lyrica start 50 mg 3 times a day Taper off gabapentin my taking 300 mg nighty for one week, then STOP  F/u in 6 weeks for chronic pain (neck, shoulder, hips, back) sooner if needed  Dr. Adrian Blackwater

## 2017-05-01 NOTE — Progress Notes (Signed)
Subjective:  Patient ID: Michele Wood, female    DOB: [birth year]  Age: 56 y.o. MRN: [id number]  CC: Neck Pain   HPI Michele Wood has hx of chronic sinusitis, chronic neck pain, chronic low back pain and HTN she  presents for   1. Chronic neck pain:  L sided neck pain. Has responded in the past to prednisone. Worsening since stopping prednisone.  Taking gabapentin 600 mg nightly, naproxen or diclofenac and flexeril prn. Current pain level is 7/10. Pain is worse when raising her L arm. She reports sleeping is good with gabapentin.  She feels like Cymbalta 30 mg daily is helping. When she takes the meloxicam at bedtime without improvement in pain. She has been referred to neurosurgery but is uninsured and cannot afford the $150-$200 co-pay.   Today, she report R shoulder pain that started last night. Pain is severe. Sharp pain. She took flexeril last night. No weakness in her R arm or hand.    MRI cervical spine 01/24/17: IMPRESSION: 1. Progressed disc degeneration since 2014 at C7-T1 which may be the symptomatic level. Moderate size leftward disc herniation air with new spinal stenosis, mild spinal cord mass effect, and mild left C8 foraminal stenosis. No associated spinal cord signal abnormality.  2. left hip pain x one month: pain in gluteal area and lateral hip. She walks with a cane. No recent trauma. She has history of low back pain.   Lumbar x-ray 06/11/13:  IMPRESSION: 1.  Negative for fracture or other acute abnormality. 2.  Facet degenerative changes L3-4 and L4-5, likely accounting for the mild grade 1 anterolisthesis L4-5.  Lumbar MRI 10/05/13 IMPRESSION: T9-10 and T10-11 mild bulge. Mild spinal stenosis. No cord compression.  L3-4 mild-to-moderate facet joint degenerative changes. Mild bulge greater left lateral position touching but not compressing the exiting left L3 nerve root. No significant spinal stenosis.  L4-5 moderate facet joint  degenerative changes. Mild bulge. No significant spinal stenosis.  L5-S1 mild to slightly moderate right-sided and mild left-sided facet joint degenerative changes. Mild bulge. Minimal contact with the upper aspect of the S1 nerve roots which are not compressed.  Past Surgical History:  Procedure Laterality Date  . ABDOMINAL HYSTERECTOMY    . BREAST BIOPSY Right 07/02/2006   [place] Core benign    Social History  Substance Use Topics  . Smoking status: Never Smoker  . Smokeless tobacco: Never Used  . Alcohol use 0.0 oz/week     Comment: socially    Outpatient Medications Prior to Visit  Medication Sig Dispense Refill  . amLODipine (NORVASC) 10 MG tablet Take 1 tablet (10 mg total) by mouth daily. 30 tablet 11  . Cholecalciferol (VITAMIN D3) 2000 UNITS TABS Take 2,000 Units by mouth daily. 30 tablet 11  . cyclobenzaprine (FLEXERIL) 10 MG tablet TAKE 1 TABLET BY MOUTH 3 TIMES DAILY AS NEEDED FOR MUSCLE SPASMS 60 tablet 0  . DULoxetine (CYMBALTA) 60 MG capsule Take 1 capsule (60 mg total) by mouth daily. 30 capsule 5  . gabapentin (NEURONTIN) 300 MG capsule Take 2 capsules (600 mg total) by mouth at bedtime. 60 capsule 5  . hydrochlorothiazide (HYDRODIURIL) 25 MG tablet Take 1 tablet (25 mg total) by mouth daily. 30 tablet 11  . lisinopril (PRINIVIL,ZESTRIL) 40 MG tablet Take 1 tablet (40 mg total) by mouth daily. 30 tablet 11  . loratadine (CLARITIN) 10 MG tablet Take 1 tablet (10 mg total) by mouth daily. 30 tablet 11  . meloxicam (MOBIC) 15 MG  tablet TAKE 1 TABLET BY MOUTH DAILY. 30 tablet 0  . montelukast (SINGULAIR) 10 MG tablet Take 1 tablet (10 mg total) by mouth at bedtime. 30 tablet 3  . omeprazole (PRILOSEC) 20 MG capsule Take 1 capsule (20 mg total) by mouth 2 (two) times daily before a meal. 30 capsule 0  . pregabalin (LYRICA) 50 MG capsule Take 1 capsule (50 mg total) by mouth 3 (three) times daily. For PASS 270 capsule 3  . triamcinolone (NASACORT AQ) 55 MCG/ACT AERO nasal  inhaler Place 2 sprays into the nose daily. 1 Inhaler 12   No facility-administered medications prior to visit.     ROS Review of Systems  Constitutional: Negative for chills and fever.  HENT: Positive for sinus pressure. Negative for congestion and ear pain.   Eyes: Negative for visual disturbance.  Respiratory: Negative for cough and shortness of breath.   Cardiovascular: Negative for chest pain.  Gastrointestinal: Negative for abdominal pain and blood in stool.  Musculoskeletal: Positive for arthralgias, back pain, gait problem, myalgias and neck pain.  Skin: Negative for rash.  Allergic/Immunologic: Negative for immunocompromised state.  Neurological: Negative for dizziness.  Hematological: Negative for adenopathy. Does not bruise/bleed easily.  Psychiatric/Behavioral: Negative for dysphoric mood and suicidal ideas.    Objective:  BP 115/79   Pulse 88   Temp 97.9 F (36.6 C) (Oral)   Wt 260 lb 6.4 oz (118.1 kg)   LMP 09/01/2013   BMI 47.63 kg/m   BP/Weight 04/10/2017 7/89/3810 10/20/5100  Systolic BP 585 277 824  Diastolic BP 84 85 84  Wt. (Lbs) 259.2 262.4 263  BMI 47.41 47.99 48.1   Physical Exam  Constitutional: She is oriented to person, place, and time. She appears well-developed and well-nourished. No distress.  HENT:  Head: Normocephalic and atraumatic.  Right Ear: Tympanic membrane, external ear and ear canal normal.  Left Ear: Tympanic membrane, external ear and ear canal normal.  Nose: Right sinus exhibits no maxillary sinus tenderness. Left sinus exhibits no maxillary sinus tenderness and no frontal sinus tenderness.  Mouth/Throat: Oropharynx is clear and moist.  Eyes: Pupils are equal, round, and reactive to light. Conjunctivae are normal.  Cardiovascular: Normal rate, regular rhythm, normal heart sounds and intact distal pulses.   Pulmonary/Chest: Effort normal and breath sounds normal.  Musculoskeletal: She exhibits no edema.       Right shoulder: She  exhibits decreased range of motion, tenderness, pain and spasm. She exhibits normal pulse and normal strength.       Left shoulder: She exhibits decreased range of motion, pain, abnormal pulse and decreased strength.       Left hip: She exhibits tenderness (at SI joint).       Left foot: There is swelling.  Positive empty can on the R side  Lymphadenopathy:    She has no cervical adenopathy.  Neurological: She is alert and oriented to person, place, and time.  Skin: Skin is warm and dry. No rash noted.  Psychiatric: She has a normal mood and affect.   Lab Results  Component Value Date   HGBA1C 6.10 11/30/2015    Assessment & Plan:  Michele Wood was seen today for neck pain.  Diagnoses and all orders for this visit:  Degenerative cervical spinal stenosis  Strain of right supraspinatus muscle, subsequent encounter -     predniSONE (DELTASONE) 10 MG tablet; Take by mouth 4 tablets for 1 day, 3 tablets for 3 days, 2 tablets for 2 days, 1 tablet for 1  days then STOP -     Ambulatory referral to Orthopedic Surgery  Pain of left sacroiliac joint -     predniSONE (DELTASONE) 10 MG tablet; Take by mouth 4 tablets for 1 day, 3 tablets for 3 days, 2 tablets for 2 days, 1 tablet for 1 days then STOP -     Ambulatory referral to Orthopedic Surgery   No orders of the defined types were placed in this encounter.   Follow-up: Return in about 6 weeks (around 06/12/2017) for pain.   Boykin Nearing MD

## 2017-05-01 NOTE — Assessment & Plan Note (Signed)
Pain x one month Resistant to current therapy prednisone taper ordered

## 2017-05-13 ENCOUNTER — Ambulatory Visit (INDEPENDENT_AMBULATORY_CARE_PROVIDER_SITE_OTHER): Payer: Self-pay

## 2017-05-13 ENCOUNTER — Ambulatory Visit (INDEPENDENT_AMBULATORY_CARE_PROVIDER_SITE_OTHER): Payer: Self-pay | Admitting: Orthopaedic Surgery

## 2017-05-13 DIAGNOSIS — G8929 Other chronic pain: Secondary | ICD-10-CM

## 2017-05-13 DIAGNOSIS — M7062 Trochanteric bursitis, left hip: Secondary | ICD-10-CM

## 2017-05-13 DIAGNOSIS — M7061 Trochanteric bursitis, right hip: Secondary | ICD-10-CM

## 2017-05-13 DIAGNOSIS — M545 Low back pain: Secondary | ICD-10-CM

## 2017-05-13 MED ORDER — METHYLPREDNISOLONE ACETATE 40 MG/ML IJ SUSP
40.0000 mg | INTRAMUSCULAR | Status: AC | PRN
  Administered 2017-05-13: 40 mg via INTRA_ARTICULAR

## 2017-05-13 MED ORDER — BUPIVACAINE HCL 0.5 % IJ SOLN
3.0000 mL | INTRAMUSCULAR | Status: AC | PRN
  Administered 2017-05-13: 3 mL via INTRA_ARTICULAR

## 2017-05-13 MED ORDER — LIDOCAINE HCL 1 % IJ SOLN
3.0000 mL | INTRAMUSCULAR | Status: AC | PRN
  Administered 2017-05-13: 3 mL

## 2017-05-13 NOTE — Progress Notes (Signed)
Office Visit Note   Patient: Michele Wood           Date of Birth: 02-14-1961           MRN: [id number] Visit Date: 05/13/2017              Requested by: Boykin Nearing, MD [address], [place] [id number] PCP: Boykin Nearing, MD   Assessment & Plan: Visit Diagnoses:  1. Chronic bilateral low back pain, with sciatica presence unspecified     Plan: Overall impression is lumbar spondylosis and bilateral trochanteric bursitis with morbid obesity. Recommended physical therapy with modalities, weight loss, core strengthening. Bilateral trochanteric bursal injections were performed today. Follow-up as needed.  Follow-Up Instructions: Return if symptoms worsen or fail to improve.   Orders:  Orders Placed This Encounter  Procedures  . XR Lumbar Spine 2-3 Views   No orders of the defined types were placed in this encounter.     Procedures: Large Joint Inj Date/Time: 05/13/2017 1:59 PM Performed by: Leandrew Koyanagi Authorized by: Leandrew Koyanagi   Consent Given by:  Patient Timeout: prior to procedure the correct patient, procedure, and site was verified   Indications:  Pain Location:  Hip Site:  R greater trochanter Prep: patient was prepped and draped in usual sterile fashion   Needle Size:  22 G Approach:  Lateral Ultrasound Guidance: No   Fluoroscopic Guidance: No   Arthrogram: No   Medications:  3 mL lidocaine 1 %; 3 mL bupivacaine 0.5 %; 40 mg methylPREDNISolone acetate 40 MG/ML Large Joint Inj Date/Time: 05/13/2017 1:59 PM Performed by: Leandrew Koyanagi Authorized by: Leandrew Koyanagi   Consent Given by:  Patient Timeout: prior to procedure the correct patient, procedure, and site was verified   Indications:  Pain Location:  Hip Site:  L greater trochanter Prep: patient was prepped and draped in usual sterile fashion   Needle Size:  22 G Approach:  Lateral Ultrasound Guidance: No   Fluoroscopic Guidance: No   Arthrogram: No   Medications:   3 mL lidocaine 1 %; 3 mL bupivacaine 0.5 %; 40 mg methylPREDNISolone acetate 40 MG/ML     Clinical Data: No additional findings.   Subjective: Chief Complaint  Patient presents with  . Left Hip - Pain  . Right Hip - Pain  . Lower Back - Pain    Patient is a 56 year old morbidly obese female comes in with low back pain and bilateral hip pain with occasional radiation down to the knee. She denies any numbness and tingling. The pain is worse at night. The pain is worse with lying on each side. Denies any injuries. Denies any groin pain.    Review of Systems  Constitutional: Negative.   HENT: Negative.   Eyes: Negative.   Respiratory: Negative.   Cardiovascular: Negative.   Endocrine: Negative.   Musculoskeletal: Negative.   Neurological: Negative.   Hematological: Negative.   Psychiatric/Behavioral: Negative.   All other systems reviewed and are negative.    Objective: Vital Signs: LMP 09/01/2013   Physical Exam  Constitutional: She is oriented to person, place, and time. She appears well-developed and well-nourished.  HENT:  Head: Normocephalic and atraumatic.  Eyes: EOM are normal.  Neck: Neck supple.  Pulmonary/Chest: Effort normal.  Abdominal: Soft.  Neurological: She is alert and oriented to person, place, and time.  Skin: Skin is warm. Capillary refill takes less than 2 seconds.  Psychiatric: She has a normal mood and affect. Her  behavior is normal. Judgment and thought content normal.  Nursing note and vitals reviewed.   Ortho Exam Bilateral hip exam is consistent with trochanteric bursitis with tenderness to palpation. Painless range of motion of the hips. No sciatic tension signs. Lumbar exam is fairly unremarkable. Specialty Comments:  No specialty comments available.  Imaging: Xr Lumbar Spine 2-3 Views  Result Date: 05/13/2017 Advanced lumbar spondylosis    PMFS History: Patient Active Problem List   Diagnosis Date Noted  . Strain of right  supraspinatus muscle 05/01/2017  . Pain of left sacroiliac joint 05/01/2017  . Chronic low back pain without sciatica 09/18/2015  . Lightheadedness 06/08/2015  . Degenerative cervical spinal stenosis 06/08/2015  . Vitamin D insufficiency 04/25/2014  . Sinusitis, chronic 08/23/2013  . HTN (hypertension) 06/21/2008   Past Medical History:  Diagnosis Date  . Chronic back pain   . Hypertension    at age 61  . Obesity   . Sinusitis   . Sleep apnea    at age 83    Family History  Problem Relation Age of Onset  . Hypertension Mother   . Hypertension Other     Past Surgical History:  Procedure Laterality Date  . ABDOMINAL HYSTERECTOMY    . BREAST BIOPSY Right 07/02/2006   [place] Core benign   Social History   Occupational History  . Not on file.   Social History Main Topics  . Smoking status: Never Smoker  . Smokeless tobacco: Never Used  . Alcohol use 0.0 oz/week     Comment: socially  . Drug use: No  . Sexual activity: Not on file

## 2017-05-15 ENCOUNTER — Ambulatory Visit: Payer: Self-pay | Attending: Family Medicine

## 2017-06-03 ENCOUNTER — Other Ambulatory Visit: Payer: Self-pay

## 2017-06-03 DIAGNOSIS — M7918 Myalgia, other site: Secondary | ICD-10-CM

## 2017-06-03 MED ORDER — DULOXETINE HCL 60 MG PO CPEP: 60.0000 mg | ORAL_CAPSULE | Freq: Every day | ORAL | 3 refills | Status: DC

## 2017-06-13 ENCOUNTER — Ambulatory Visit: Payer: Self-pay | Attending: Internal Medicine | Admitting: Licensed Clinical Social Worker

## 2017-06-13 ENCOUNTER — Ambulatory Visit: Payer: Self-pay | Attending: Internal Medicine | Admitting: Internal Medicine

## 2017-06-13 ENCOUNTER — Encounter: Payer: Self-pay | Admitting: Internal Medicine

## 2017-06-13 VITALS — BP 128/89 | HR 75 | Temp 98.3°F | Resp 16 | Wt 262.2 lb

## 2017-06-13 DIAGNOSIS — F419 Anxiety disorder, unspecified: Secondary | ICD-10-CM | POA: Insufficient documentation

## 2017-06-13 DIAGNOSIS — Z79899 Other long term (current) drug therapy: Secondary | ICD-10-CM | POA: Insufficient documentation

## 2017-06-13 DIAGNOSIS — I10 Essential (primary) hypertension: Secondary | ICD-10-CM | POA: Insufficient documentation

## 2017-06-13 DIAGNOSIS — F329 Major depressive disorder, single episode, unspecified: Secondary | ICD-10-CM | POA: Insufficient documentation

## 2017-06-13 DIAGNOSIS — Z23 Encounter for immunization: Secondary | ICD-10-CM | POA: Insufficient documentation

## 2017-06-13 DIAGNOSIS — M7062 Trochanteric bursitis, left hip: Secondary | ICD-10-CM | POA: Insufficient documentation

## 2017-06-13 DIAGNOSIS — E669 Obesity, unspecified: Secondary | ICD-10-CM | POA: Insufficient documentation

## 2017-06-13 DIAGNOSIS — M7061 Trochanteric bursitis, right hip: Secondary | ICD-10-CM | POA: Insufficient documentation

## 2017-06-13 DIAGNOSIS — M5440 Lumbago with sciatica, unspecified side: Secondary | ICD-10-CM | POA: Insufficient documentation

## 2017-06-13 DIAGNOSIS — M545 Low back pain: Secondary | ICD-10-CM

## 2017-06-13 DIAGNOSIS — M5412 Radiculopathy, cervical region: Secondary | ICD-10-CM | POA: Insufficient documentation

## 2017-06-13 DIAGNOSIS — G8929 Other chronic pain: Secondary | ICD-10-CM | POA: Insufficient documentation

## 2017-06-13 DIAGNOSIS — Z6841 Body Mass Index (BMI) 40.0 and over, adult: Secondary | ICD-10-CM | POA: Insufficient documentation

## 2017-06-13 DIAGNOSIS — M5010 Cervical disc disorder with radiculopathy, unspecified cervical region: Secondary | ICD-10-CM | POA: Insufficient documentation

## 2017-06-13 DIAGNOSIS — F32A Depression, unspecified: Secondary | ICD-10-CM | POA: Insufficient documentation

## 2017-06-13 NOTE — BH Specialist Note (Signed)
Integrated Behavioral Health Initial Visit  MRN: [id number] Name: Michele Wood   Session Start time: 12:00 PM Session End time: 12:30 PM Total time: 30 minutes  Type of Service: [place] Interpretor:No. Interpretor Name and Language: N/A   Warm Hand Off Completed.       SUBJECTIVE: Michele Wood is a 56 y.o. female accompanied by spouse. Patient was referred by Dr. Wynetta Emery for depression and anxiety. Patient reports the following symptoms/concerns: chronic pain, overwhelming feelings of sadness and worry, difficulty sleeping, low energy, difficulty concentrating, and hx of suicidal ideations Duration of problem: "early 2005"; Severity of problem: moderate  OBJECTIVE: Mood: Anxious and Affect: Appropriate Risk of harm to self or others: No plan to harm self or others   LIFE CONTEXT: Family and Social: Pt receives strong support from spouse of eleven years and her two adult sons School/Work: Pt receives disability ($800) Self-Care: Pt enjoys spending time with grandchildren stating they "keep my spirits up". She enjoys watching television Life Changes: Pt has ongoing medical concerns that results in chronic pain  GOALS ADDRESSED: Patient will reduce symptoms of: anxiety and depression and increase knowledge and/or ability of: coping skills and also: Increase adequate support systems for patient/family   INTERVENTIONS: Solution-Focused Strategies, Supportive Counseling, Psychoeducation and/or Health Education and Link to [employer]  Standardized Assessments completed: GAD-7 and PHQ 2&9 with C-SSRS  ASSESSMENT: Patient currently experiencing anxiety and depression triggered by ongoing medical concerns. She reports chronic pain, overwhelming feelings of sadness and worry, difficulty sleeping, low energy, difficulty concentrating, and hx of suicidal ideations. Pt denies current SI/HI/AVH. She receives  strong support from spouse. Patient may benefit from psychoeducation, psychotherapy, and medication management. LCSWA educated pt on the correlation between one's physical and mental health. LCSWA inquired about protective factors and discussed safety plan if she experienced a crisis. Pt successfully identified healthy coping skills to utilize to decrease symptoms, including participating in medication management. LCSWA provided pt with resources on crisis intervention and psychotherapy.  PLAN: 1. Follow up with behavioral health clinician on : Pt was encouraged to contact LCSWA if symptoms worsen or fail to improve to schedule behavioral appointments at [facility]. 2. Behavioral recommendations: LCSWA recommends that pt apply healthy coping skills discussed, comply with medication management, and utilize provided resources. Pt is encouraged to schedule follow up appointment with LCSWA 3. Referral(s): [place] (LME/Outside Clinic) 4. "From scale of 1-10, how likely are you to follow plan?": 8/10  Rebekah Chesterfield, LCSW 06/17/17 4:08 PM

## 2017-06-13 NOTE — Patient Instructions (Addendum)
We will resubmit referral for you to see a neurosurgeon for your neck.  Influenza Virus Vaccine injection (Fluarix) What is this medicine? INFLUENZA VIRUS VACCINE (in floo EN zuh VAHY ruhs vak SEEN) helps to reduce the risk of getting influenza also known as the flu. This medicine may be used for other purposes; ask your health care provider or pharmacist if you have questions. COMMON BRAND NAME(S): Fluarix, Fluzone What should I tell my health care provider before I take this medicine? They need to know if you have any of these conditions: -bleeding disorder like hemophilia -fever or infection -Guillain-Barre syndrome or other neurological problems -immune system problems -infection with the human immunodeficiency virus (HIV) or AIDS -low blood platelet counts -multiple sclerosis -an unusual or allergic reaction to influenza virus vaccine, eggs, chicken proteins, latex, gentamicin, other medicines, foods, dyes or preservatives -pregnant or trying to get pregnant -breast-feeding How should I use this medicine? This vaccine is for injection into a muscle. It is given by a health care professional. A copy of Vaccine Information Statements will be given before each vaccination. Read this sheet carefully each time. The sheet may change frequently. Talk to your pediatrician regarding the use of this medicine in children. Special care may be needed. Overdosage: If you think you have taken too much of this medicine contact a poison control center or emergency room at once. NOTE: This medicine is only for you. Do not share this medicine with others. What if I miss a dose? This does not apply. What may interact with this medicine? -chemotherapy or radiation therapy -medicines that lower your immune system like etanercept, anakinra, infliximab, and adalimumab -medicines that treat or prevent blood clots like warfarin -phenytoin -steroid medicines like prednisone or  cortisone -theophylline -vaccines This list may not describe all possible interactions. Give your health care provider a list of all the medicines, herbs, non-prescription drugs, or dietary supplements you use. Also tell them if you smoke, drink alcohol, or use illegal drugs. Some items may interact with your medicine. What should I watch for while using this medicine? Report any side effects that do not go away within 3 days to your doctor or health care professional. Call your health care provider if any unusual symptoms occur within 6 weeks of receiving this vaccine. You may still catch the flu, but the illness is not usually as bad. You cannot get the flu from the vaccine. The vaccine will not protect against colds or other illnesses that may cause fever. The vaccine is needed every year. What side effects may I notice from receiving this medicine? Side effects that you should report to your doctor or health care professional as soon as possible: -allergic reactions like skin rash, itching or hives, swelling of the face, lips, or tongue Side effects that usually do not require medical attention (report to your doctor or health care professional if they continue or are bothersome): -fever -headache -muscle aches and pains -pain, tenderness, redness, or swelling at site where injected -weak or tired This list may not describe all possible side effects. Call your doctor for medical advice about side effects. You may report side effects to FDA at 1-800-FDA-1088. Where should I keep my medicine? This vaccine is only given in a clinic, pharmacy, doctor's office, or other health care setting and will not be stored at home. NOTE: This sheet is a summary. It may not cover all possible information. If you have questions about this medicine, talk to your doctor, pharmacist, or  health care provider.  2018 Elsevier/Gold Standard (2008-04-27 09:30:40)

## 2017-06-13 NOTE — Progress Notes (Signed)
Patient ID: Michele Wood, female    DOB: 08-Nov-1960  MRN: [id number]  CC: re-establish and Neck Pain   Subjective: Michele Wood is a 56 y.o. female who presents to est care with me. Spouse, Jenny Reichmann, is with her. Saw Dr. Adrian Blackwater 04/2017. Her concerns today include:  Hx of HTN, chronic sinusitis, degen disc of c-spine, chronic LBP, vit D def, obesity  1. Chronic LBP Last lumbar MRI 09/2013 revealed mild to moderate DJD of facet joints and mild bulging disc at several levels Referred to Dr. Erlinda Hong on last visit. Assessed to have chronic LBP with sciatica and bilateral trochanteric bursitis -inj to bursa helped  -referred to P.T. Did P.T 4 yrs ago and "they released me and told there was nothing more they can do." -does not feel it would be helpful going back. Encouraged to walk more and how to bend which she has continued to do. Neck: hurts all the time. Numbness at fingers LT, numbness and tingling arm especially around shoulder.   2. Neck Pain -MRI of cervical spine done in the spring of this year revealed herniated disc on the left side at C7-C8 level with new spinal stenosis and mild spinal cord mass effect -Referred to neurosurgeon but did not have insurance at the time. Now has disability and Medicare as of this month and would like to move forward with referral  3. HTN: compliant with meds and salt restriction  4. Dep/Anxiety: Depression and anxiety screen positive today Use to see a therapist at [facility]. Not as bad as it use to be. Symptoms of depression fluctuates . No SI. Cymbalta helps.   HM: due for flu shot and colon CA screening  Patient Active Problem List   Diagnosis Date Noted  . Anxiety and depression 06/13/2017  . Cervical radiculopathy due to intervertebral disc disorder 06/13/2017  . Trochanteric bursitis of both hips 05/13/2017  . Strain of right supraspinatus muscle 05/01/2017  . Chronic low back pain without sciatica 09/18/2015  .  Degenerative cervical spinal stenosis 06/08/2015  . Vitamin D insufficiency 04/25/2014  . Sinusitis, chronic 08/23/2013  . HTN (hypertension) 06/21/2008     Current Outpatient Prescriptions on File Prior to Visit  Medication Sig Dispense Refill  . amLODipine (NORVASC) 10 MG tablet Take 1 tablet (10 mg total) by mouth daily. 30 tablet 11  . Cholecalciferol (VITAMIN D3) 2000 UNITS TABS Take 2,000 Units by mouth daily. 30 tablet 11  . cyclobenzaprine (FLEXERIL) 10 MG tablet TAKE 1 TABLET BY MOUTH 3 TIMES DAILY AS NEEDED FOR MUSCLE SPASMS 60 tablet 0  . DULoxetine (CYMBALTA) 60 MG capsule Take 1 capsule (60 mg total) by mouth daily. 90 capsule 3  . gabapentin (NEURONTIN) 300 MG capsule Take 2 capsules (600 mg total) by mouth at bedtime. 60 capsule 5  . hydrochlorothiazide (HYDRODIURIL) 25 MG tablet Take 1 tablet (25 mg total) by mouth daily. 30 tablet 11  . lisinopril (PRINIVIL,ZESTRIL) 40 MG tablet Take 1 tablet (40 mg total) by mouth daily. 30 tablet 11  . loratadine (CLARITIN) 10 MG tablet Take 1 tablet (10 mg total) by mouth daily. 30 tablet 11  . meloxicam (MOBIC) 15 MG tablet TAKE 1 TABLET BY MOUTH DAILY. 30 tablet 0  . montelukast (SINGULAIR) 10 MG tablet Take 1 tablet (10 mg total) by mouth at bedtime. 30 tablet 3  . omeprazole (PRILOSEC) 20 MG capsule Take 1 capsule (20 mg total) by mouth 2 (two) times daily before a meal. 30 capsule 0  .  pregabalin (LYRICA) 50 MG capsule Take 1 capsule (50 mg total) by mouth 3 (three) times daily. For PASS 270 capsule 3  . triamcinolone (NASACORT AQ) 55 MCG/ACT AERO nasal inhaler Place 2 sprays into the nose daily. 1 Inhaler 12   No current facility-administered medications on file prior to visit.     Allergies  Allergen Reactions  . Milk-Related Compounds Diarrhea and Other (See Comments)    Diarrhea and stomach upset  . Codeine Sulfate Rash    Social History   Social History  . Marital status: Married    Spouse name: N/A  . Number of  children: N/A  . Years of education: N/A   Occupational History  . Not on file.   Social History Main Topics  . Smoking status: Never Smoker  . Smokeless tobacco: Never Used  . Alcohol use 0.0 oz/week     Comment: socially  . Drug use: No  . Sexual activity: Not on file   Other Topics Concern  . Not on file   Social History Narrative  . No narrative on file    Family History  Problem Relation Age of Onset  . Hypertension Mother   . Hypertension Other     Past Surgical History:  Procedure Laterality Date  . ABDOMINAL HYSTERECTOMY    . BREAST BIOPSY Right 07/02/2006   [place] Core benign    ROS: Review of Systems Negative except as stated above PHYSICAL EXAM: BP 128/89   Pulse 75   Temp 98.3 F (36.8 C) (Oral)   Resp 16   Wt 262 lb 3.2 oz (118.9 kg)   LMP 09/01/2013   SpO2 98%   BMI 47.96 kg/m   Physical Exam General appearance - alert, well appearing, obese African-American female and in no distress Mental status - alert, oriented to person, place, and time, normal mood, behavior, speech, dress, motor activity, and thought processes Mouth - mucous membranes moist, pharynx normal without lesions Chest - clear to auscultation, no wheezes, rales or rhonchi, symmetric air entry Heart - normal rate, regular rhythm, normal S1, S2, no murmurs, rubs, clicks or gallops Musculoskeletal - ambulates with a wooden cane. Gait is stable Neck: Mild to moderate tenderness on palpation of the C-spine. Discomfort with attempted passive movement of the neck. Extremities -no lower extremity edema Depression screen [name] 2/9 06/13/2017 05/01/2017 04/10/2017 02/27/2017 01/16/2017  Decreased Interest '2 1 1 2 2  '$ Down, Depressed, Hopeless '2 2 1 3 2  '$ PHQ - 2 Score '4 3 2 5 4  '$ Altered sleeping '3 2 1 3 3  '$ Tired, decreased energy '3 2 2 2 3  '$ Change in appetite 1 0 0 1 2  Feeling bad or failure about yourself  '1 3 3 3 3  '$ Trouble concentrating '3 3 3 3 3  '$ Moving slowly or fidgety/restless '2 2 3 1 1    '$ Suicidal thoughts '1 1 1 2 1  '$ PHQ-9 Score '18 16 15 20 20  '$ Some recent data might be hidden   GAD 7 : Generalized Anxiety Score 06/13/2017 05/01/2017 04/10/2017 02/27/2017  Nervous, Anxious, on Edge '2 1 1 2  '$ Control/stop worrying '3 2 3 3  '$ Worry too much - different things '3 2 3 3  '$ Trouble relaxing '2 2 2 2  '$ Restless '2 1 1 1  '$ Easily annoyed or irritable '1 1 2 2  '$ Afraid - awful might happen '2 1 2 1  '$ Total GAD 7 Score '15 10 14 14     '$ ASSESSMENT AND  PLAN: 1. Cervical radiculopathy -Message sent to referral specialist to move forward with referral to neurosurgeon now that patient has Medicare  2. Chronic bilateral low back pain without sciatica -Encourage increased physical activity as tolerated. Continue Mobic and Cymbalta. She is also on gabapentin and Lyrica. Reports feeling stable on this combination  3. Essential hypertension Controlled. Continue current medications and DASH diet  4. Anxiety and depression On Cymbalta. She is interested in doing some therapy sessions. I have asked our LCSW to meet with her today  5. Need for influenza vaccination - Flu Vaccine QUAD 6+ mos PF IM (Fluarix Quad PF)  Addendum: We spoke about colon cancer screening today. Patient was interested in doing the Fit test. However we forgot to give her kit before she left. We will give on follow-up visit  Patient was given the opportunity to ask questions.  Patient verbalized understanding of the plan and was able to repeat key elements of the plan.   Orders Placed This Encounter  Procedures  . Flu Vaccine QUAD 6+ mos PF IM (Fluarix Quad PF)     Requested Prescriptions    No prescriptions requested or ordered in this encounter    Return in about 3 months (around 09/12/2017).  Karle Plumber, MD, FACP

## 2017-07-14 DIAGNOSIS — H1013 Acute atopic conjunctivitis, bilateral: Secondary | ICD-10-CM | POA: Diagnosis not present

## 2017-07-14 DIAGNOSIS — H40033 Anatomical narrow angle, bilateral: Secondary | ICD-10-CM | POA: Diagnosis not present

## 2017-09-12 ENCOUNTER — Ambulatory Visit: Payer: Medicare Other | Attending: Internal Medicine | Admitting: Internal Medicine

## 2017-09-12 ENCOUNTER — Encounter: Payer: Self-pay | Admitting: Internal Medicine

## 2017-09-12 VITALS — BP 118/80 | HR 87 | Temp 98.4°F | Resp 18 | Ht 62.0 in | Wt 269.4 lb

## 2017-09-12 DIAGNOSIS — I10 Essential (primary) hypertension: Secondary | ICD-10-CM | POA: Insufficient documentation

## 2017-09-12 DIAGNOSIS — M545 Low back pain, unspecified: Secondary | ICD-10-CM

## 2017-09-12 DIAGNOSIS — F329 Major depressive disorder, single episode, unspecified: Secondary | ICD-10-CM | POA: Diagnosis not present

## 2017-09-12 DIAGNOSIS — Z1211 Encounter for screening for malignant neoplasm of colon: Secondary | ICD-10-CM

## 2017-09-12 DIAGNOSIS — R7303 Prediabetes: Secondary | ICD-10-CM

## 2017-09-12 DIAGNOSIS — E559 Vitamin D deficiency, unspecified: Secondary | ICD-10-CM | POA: Insufficient documentation

## 2017-09-12 DIAGNOSIS — M5412 Radiculopathy, cervical region: Secondary | ICD-10-CM | POA: Diagnosis not present

## 2017-09-12 DIAGNOSIS — M5010 Cervical disc disorder with radiculopathy, unspecified cervical region: Secondary | ICD-10-CM | POA: Diagnosis not present

## 2017-09-12 DIAGNOSIS — F419 Anxiety disorder, unspecified: Secondary | ICD-10-CM | POA: Insufficient documentation

## 2017-09-12 DIAGNOSIS — E669 Obesity, unspecified: Secondary | ICD-10-CM

## 2017-09-12 DIAGNOSIS — Z6841 Body Mass Index (BMI) 40.0 and over, adult: Secondary | ICD-10-CM | POA: Diagnosis not present

## 2017-09-12 DIAGNOSIS — G8929 Other chronic pain: Secondary | ICD-10-CM

## 2017-09-12 MED ORDER — GABAPENTIN 300 MG PO CAPS
ORAL_CAPSULE | ORAL | 5 refills | Status: DC
Start: 2017-09-12 — End: 2017-12-12

## 2017-09-12 NOTE — Progress Notes (Signed)
Patient ID: Michele Wood, female    DOB: 09-10-1961  MRN: [id number]  CC: Follow-up   Subjective: Michele Wood is a 56 y.o. female who presents for chronic ds manage. Husband is with her. Her concerns today include:  Hx of HTN, chronic sinusitis, degen disc of c-spine, chronic LBP, vit D def, obesity  1. Pain in neck and lower back worse in cold weather Never got Lyrica through PAP. Sent in application through our pharmacy over 6 mths ago On Cymbalta and Gabapentin at nights -has form for handicap sticker -referred to neurosurgeon but has not heard anything as yet. Looks like our referral person just sent that over on 08/21/2017 to [place] lumbar MRI 09/2013 revealed mild to moderate DJD of facet joints and mild bulging disc at several levels -MRI of cervical spine done in the spring of this year revealed herniated disc on the left side at C7-C8 level with new spinal stenosis and mild spinal cord mass effect  2. HTN compliant -SOB sometimes. No PND/orthopnea. -some LE edema.   3. Obesity: can not walk long distances but tries to stay active in house. Would like to be able to get out and do more [place] to [facility] [place]  4. Dep/anxiety -feels useless and worhtless because she is not able to do as much as she would like due to chronic pain issues Use to play ball with her boys. Now can not stand for more than 10 mins SI every now and then as a passing thought -seen by LCSW on last visit. She did not explore seeing a mental health provider in the community and does not want to at this time  Patient Active Problem List   Diagnosis Date Noted  . Anxiety and depression 06/13/2017  . Cervical radiculopathy due to intervertebral disc disorder 06/13/2017  . Trochanteric bursitis of both hips 05/13/2017  . Strain of right supraspinatus muscle 05/01/2017  . Chronic low back pain without sciatica 09/18/2015  . Degenerative cervical spinal  stenosis 06/08/2015  . Vitamin D insufficiency 04/25/2014  . Sinusitis, chronic 08/23/2013  . HTN (hypertension) 06/21/2008     Current Outpatient Medications on File Prior to Visit  Medication Sig Dispense Refill  . amLODipine (NORVASC) 10 MG tablet Take 1 tablet (10 mg total) by mouth daily. 30 tablet 11  . Cholecalciferol (VITAMIN D3) 2000 UNITS TABS Take 2,000 Units by mouth daily. 30 tablet 11  . cyclobenzaprine (FLEXERIL) 10 MG tablet TAKE 1 TABLET BY MOUTH 3 TIMES DAILY AS NEEDED FOR MUSCLE SPASMS 60 tablet 0  . DULoxetine (CYMBALTA) 60 MG capsule Take 1 capsule (60 mg total) by mouth daily. 90 capsule 3  . gabapentin (NEURONTIN) 300 MG capsule Take 2 capsules (600 mg total) by mouth at bedtime. 60 capsule 5  . hydrochlorothiazide (HYDRODIURIL) 25 MG tablet Take 1 tablet (25 mg total) by mouth daily. 30 tablet 11  . lisinopril (PRINIVIL,ZESTRIL) 40 MG tablet Take 1 tablet (40 mg total) by mouth daily. 30 tablet 11  . loratadine (CLARITIN) 10 MG tablet Take 1 tablet (10 mg total) by mouth daily. 30 tablet 11  . montelukast (SINGULAIR) 10 MG tablet Take 1 tablet (10 mg total) by mouth at bedtime. 30 tablet 3  . triamcinolone (NASACORT AQ) 55 MCG/ACT AERO nasal inhaler Place 2 sprays into the nose daily. 1 Inhaler 12  . meloxicam (MOBIC) 15 MG tablet TAKE 1 TABLET BY MOUTH DAILY. (Patient not taking: Reported on 09/12/2017) 30 tablet 0  .  omeprazole (PRILOSEC) 20 MG capsule Take 1 capsule (20 mg total) by mouth 2 (two) times daily before a meal. (Patient not taking: Reported on 09/12/2017) 30 capsule 0  . pregabalin (LYRICA) 50 MG capsule Take 1 capsule (50 mg total) by mouth 3 (three) times daily. For PASS (Patient not taking: Reported on 09/12/2017) 270 capsule 3   No current facility-administered medications on file prior to visit.     Allergies  Allergen Reactions  . Milk-Related Compounds Diarrhea and Other (See Comments)    Diarrhea and stomach upset  . Codeine Sulfate Rash     Social History   Socioeconomic History  . Marital status: Married    Spouse name: Not on file  . Number of children: Not on file  . Years of education: Not on file  . Highest education level: Not on file  Social Needs  . Financial resource strain: Not on file  . Food insecurity - worry: Not on file  . Food insecurity - inability: Not on file  . Transportation needs - medical: Not on file  . Transportation needs - non-medical: Not on file  Occupational History  . Not on file  Tobacco Use  . Smoking status: Never Smoker  . Smokeless tobacco: Never Used  Substance and Sexual Activity  . Alcohol use: Yes    Alcohol/week: 0.0 oz    Comment: socially  . Drug use: No  . Sexual activity: Not on file  Other Topics Concern  . Not on file  Social History Narrative  . Not on file    Family History  Problem Relation Age of Onset  . Hypertension Mother   . Hypertension Other     Past Surgical History:  Procedure Laterality Date  . ABDOMINAL HYSTERECTOMY    . BREAST BIOPSY Right 07/02/2006   [place] Core benign    ROS: Review of Systems Neg except as above PHYSICAL EXAM: BP 118/80 (BP Location: Left Arm, Patient Position: Sitting, Cuff Size: Large)   Pulse 87   Temp 98.4 F (36.9 C) (Oral)   Resp 18   Ht 5\' 2"  (1.575 m)   Wt 269 lb 6.4 oz (122.2 kg)   LMP 09/01/2013   SpO2 96%   BMI 49.27 kg/m   Wt Readings from Last 3 Encounters:  09/12/17 269 lb 6.4 oz (122.2 kg)  06/13/17 262 lb 3.2 oz (118.9 kg)  05/01/17 260 lb 6.4 oz (118.1 kg)   Physical Exam  General appearance - alert, well appearing, morbidly obese female and in no distress Mental status - alert, oriented to person, place, and time, normal mood, behavior, speech, dress, motor activity, and thought processes Mouth - mucous membranes moist, pharynx normal without lesions Neck - supple, no significant adenopathy Chest - clear to auscultation, no wheezes, rales or rhonchi, symmetric air entry Heart -  normal rate, regular rhythm, normal S1, S2, no murmurs, rubs, clicks or gallops Extremities - peripheral pulses normal, no pedal edema, no clubbing or cyanosis   ASSESSMENT AND PLAN: 1. Cervical radiculopathy due to intervertebral disc disorder 2. Chronic bilateral low back pain without sciatica -Patient given phone number for the neurosurgery office which she was referred so that she can call. -Since pain is worse in the winter months we discussed increasing gabapentin to 3 times a day dosing.  Patient is agreeable to this. -Encouraged her to consider joining the [employer] to do water aerobics.  She feels this is something she would like to do and will look into it. -  Form completed for handicap sticker. - gabapentin (NEURONTIN) 300 MG capsule; 1 tab PO Q a.m and noon. 2 tabs PO Q p.m  Dispense: 120 capsule; Refill: 5  3. Anxiety and depression Continue Cymbalta.  She declines any add on medication.  She will let me know if she changes her mind about seeing a therapist  4. Class 3 severe obesity with serious comorbidity and body mass index (BMI) of 45.0 to 49.9 in adult, unspecified obesity type ([place]) Encourage increase physical activity.  [facility].  5. Colon cancer screening - Fecal occult blood, imunochemical  6. Essential hypertension Controlled. -- Comprehensive metabolic panel - CBC - Lipid panel  Patient was given the opportunity to ask questions.  Patient verbalized understanding of the plan and was able to repeat key elements of the plan.   No orders of the defined types were placed in this encounter.    Requested Prescriptions    No prescriptions requested or ordered in this encounter    No Follow-up on file.  Karle Plumber, MD, FACP

## 2017-09-12 NOTE — Progress Notes (Signed)
Patient stated that she is having a lower back pain and neck pain. Patient stated that her neck pain runs through her shoulder and feels like a needle pinching her. Patient stated that her back pain feels like its stabbing her and its hard for her to sit or drive.

## 2017-09-12 NOTE — Patient Instructions (Signed)
Consider doing water aerobics at the [facility] as discussed today.  Increase Gabapentin to 300 mg in morning and noon and 600 mg at nights.  Let me know if you change your moind about being referred to a therapist for depression/anxiety.  Follow a Healthy Eating Plan - You can do it! Limit sugary drinks.  Avoid sodas, sweet tea, sport or energy drinks, or fruit drinks.  Drink water, lo-fat milk, or diet drinks. Limit snack foods.   Cut back on candy, cake, cookies, chips, ice cream.  These are a special treat, only in small amounts. Eat plenty of vegetables.  Especially dark green, red, and orange vegetables. Aim for at least 3 servings a day. More is better! Include fruit in your daily diet.  Whole fruit is much healthier than fruit juice! Limit "white" bread, "white" pasta, "white" rice.   Choose "100% whole grain" products, brown or wild rice. Avoid fatty meats. Try "Meatless Monday" and choose eggs or beans one day a week.  When eating meat, choose lean meats like chicken, [place], and fish.  Grill, broil, or bake meats instead of frying, and eat poultry without the skin. Eat less salt.  Avoid frozen pizzas, frozen dinners and salty foods.  Use seasonings other than salt in cooking.  This can help blood pressure and keep you from swelling Beer, wine and liquor have calories.  If you can safely drink alcohol, limit to 1 drink per day for women, 2 drinks for men

## 2017-09-13 LAB — COMPREHENSIVE METABOLIC PANEL
A/G RATIO: 1.1 — AB (ref 1.2–2.2)
ALK PHOS: 83 IU/L (ref 39–117)
ALT: 13 IU/L (ref 0–32)
AST: 13 IU/L (ref 0–40)
Albumin: 4 g/dL (ref 3.5–5.5)
BILIRUBIN TOTAL: 0.2 mg/dL (ref 0.0–1.2)
BUN/Creatinine Ratio: 15 (ref 9–23)
BUN: 12 mg/dL (ref 6–24)
CHLORIDE: 97 mmol/L (ref 96–106)
CO2: 28 mmol/L (ref 20–29)
Calcium: 9.5 mg/dL (ref 8.7–10.2)
Creatinine, Ser: 0.82 mg/dL (ref 0.57–1.00)
GFR calc Af Amer: 93 mL/min/{1.73_m2} (ref >59)
GFR calc non Af Amer: 81 mL/min/{1.73_m2} (ref >59)
GLOBULIN, TOTAL: 3.5 g/dL (ref 1.5–4.5)
Glucose: 165 mg/dL — ABNORMAL HIGH (ref 65–99)
POTASSIUM: 3.8 mmol/L (ref 3.5–5.2)
Sodium: 140 mmol/L (ref 134–144)
Total Protein: 7.5 g/dL (ref 6.0–8.5)

## 2017-09-13 LAB — CBC
HEMOGLOBIN: 12.5 g/dL (ref 11.1–15.9)
Hematocrit: 36.8 % (ref 34.0–46.6)
MCH: 26.9 pg (ref 26.6–33.0)
MCHC: 34 g/dL (ref 31.5–35.7)
MCV: 79 fL (ref 79–97)
PLATELETS: 322 10*3/uL (ref 150–379)
RBC: 4.65 x10E6/uL (ref 3.77–5.28)
RDW: 14.3 % (ref 12.3–15.4)
WBC: 7.2 10*3/uL (ref 3.4–10.8)

## 2017-09-13 LAB — LIPID PANEL
CHOLESTEROL TOTAL: 219 mg/dL — AB (ref 100–199)
Chol/HDL Ratio: 6.8 ratio — ABNORMAL HIGH (ref 0.0–4.4)
HDL: 32 mg/dL — ABNORMAL LOW (ref >39)
LDL Calculated: 125 mg/dL — ABNORMAL HIGH (ref 0–99)
TRIGLYCERIDES: 310 mg/dL — AB (ref 0–149)
VLDL CHOLESTEROL CAL: 62 mg/dL — AB (ref 5–40)

## 2017-09-14 ENCOUNTER — Telehealth: Payer: Self-pay | Admitting: Internal Medicine

## 2017-09-14 NOTE — Telephone Encounter (Signed)
Phone call placed to patient this a.m. to discuss lab results.  Patient informed that her LDL cholesterol was 125 with goal being less than 100.  Her ASCVD risk score is 9.4%.  We discussed the option of placing her on medication versus giving her 3-6 months to make changes in her eating habits and improve on physical activity to help get LDL down.  Patient opted for the latter. Blood sugar was 165.  Patient reports drinking orange juice before she came to her visit.  In February 2017 A1c was 6.1 which is considered prediabetes.  Patient states that she was never informed of this by previous provider.  We discussed dietary changes needed including decreasing consumption of white carbohydrates, decreasing portion sizes and eliminating sweet drinks.  Aerobic exercise encourage as discussed on recent visit. I will asked the lab to add A1c to current blood tests.  Results for orders placed or performed in visit on 09/12/17  Comprehensive metabolic panel  Result Value Ref Range   Glucose 165 (H) 65 - 99 mg/dL   BUN 12 6 - 24 mg/dL   Creatinine, Ser 0.82 0.57 - 1.00 mg/dL   GFR calc non Af Amer 81 >59 mL/min/1.73   GFR calc Af Amer 93 >59 mL/min/1.73   BUN/Creatinine Ratio 15 9 - 23   Sodium 140 134 - 144 mmol/L   Potassium 3.8 3.5 - 5.2 mmol/L   Chloride 97 96 - 106 mmol/L   CO2 28 20 - 29 mmol/L   Calcium 9.5 8.7 - 10.2 mg/dL   Total Protein 7.5 6.0 - 8.5 g/dL   Albumin 4.0 3.5 - 5.5 g/dL   Globulin, Total 3.5 1.5 - 4.5 g/dL   Albumin/Globulin Ratio 1.1 (L) 1.2 - 2.2   Bilirubin Total 0.2 0.0 - 1.2 mg/dL   Alkaline Phosphatase 83 39 - 117 IU/L   AST 13 0 - 40 IU/L   ALT 13 0 - 32 IU/L  CBC  Result Value Ref Range   WBC 7.2 3.4 - 10.8 x10E3/uL   RBC 4.65 3.77 - 5.28 x10E6/uL   Hemoglobin 12.5 11.1 - 15.9 g/dL   Hematocrit 36.8 34.0 - 46.6 %   MCV 79 79 - 97 fL   MCH 26.9 26.6 - 33.0 pg   MCHC 34.0 31.5 - 35.7 g/dL   RDW 14.3 12.3 - 15.4 %   Platelets 322 150 - 379 x10E3/uL  Lipid  panel  Result Value Ref Range   Cholesterol, Total 219 (H) 100 - 199 mg/dL   Triglycerides 310 (H) 0 - 149 mg/dL   HDL 32 (L) >39 mg/dL   VLDL Cholesterol Cal 62 (H) 5 - 40 mg/dL   LDL Calculated 125 (H) 0 - 99 mg/dL   Chol/HDL Ratio 6.8 (H) 0.0 - 4.4 ratio

## 2017-09-15 NOTE — Addendum Note (Signed)
Addended by: Karle Plumber B on: 09/15/2017 08:43 AM   Modules accepted: Orders

## 2017-09-16 ENCOUNTER — Telehealth: Payer: Self-pay | Admitting: Internal Medicine

## 2017-09-16 ENCOUNTER — Other Ambulatory Visit: Payer: Self-pay | Admitting: Pharmacist

## 2017-09-16 DIAGNOSIS — I10 Essential (primary) hypertension: Secondary | ICD-10-CM

## 2017-09-16 LAB — HEMOGLOBIN A1C
Est. average glucose Bld gHb Est-mCnc: 148 mg/dL
Hgb A1c MFr Bld: 6.8 % — ABNORMAL HIGH (ref 4.8–5.6)

## 2017-09-16 LAB — SPECIMEN STATUS REPORT

## 2017-09-16 MED ORDER — LISINOPRIL 40 MG PO TABS: 40.0000 mg | ORAL_TABLET | Freq: Every day | ORAL | 2 refills | Status: DC

## 2017-09-16 MED ORDER — AMLODIPINE BESYLATE 10 MG PO TABS: 10.0000 mg | ORAL_TABLET | Freq: Every day | ORAL | 2 refills | Status: DC

## 2017-09-16 MED ORDER — HYDROCHLOROTHIAZIDE 25 MG PO TABS: 25.0000 mg | ORAL_TABLET | Freq: Every day | ORAL | 2 refills | Status: DC

## 2017-09-16 MED ORDER — METFORMIN HCL 500 MG PO TABS: 500.0000 mg | ORAL_TABLET | Freq: Every day | ORAL | 3 refills | Status: DC

## 2017-09-16 NOTE — Telephone Encounter (Signed)
PC placed to pt this a.m. Pt advised that A1C came back at 6.8 confirming DM. I recommend starting low dose Metformin. Pt told that med can cause bloating and diarrhea. If this becomes a problem, please let me know. Pt requested med be sent to [facility] pharmacy.

## 2017-09-17 ENCOUNTER — Other Ambulatory Visit: Payer: Self-pay | Admitting: *Deleted

## 2017-09-17 DIAGNOSIS — M7918 Myalgia, other site: Secondary | ICD-10-CM

## 2017-09-17 MED ORDER — PREGABALIN 50 MG PO CAPS: 50.0000 mg | ORAL_CAPSULE | Freq: Three times a day (TID) | ORAL | 3 refills | Status: DC

## 2017-09-17 NOTE — Telephone Encounter (Signed)
PRINTED FOR PASS PROGRAM 

## 2017-09-29 ENCOUNTER — Other Ambulatory Visit: Payer: Self-pay | Admitting: Pharmacist

## 2017-09-29 DIAGNOSIS — M7918 Myalgia, other site: Secondary | ICD-10-CM

## 2017-09-29 MED ORDER — DULOXETINE HCL 60 MG PO CPEP: 60.0000 mg | ORAL_CAPSULE | Freq: Every day | ORAL | 0 refills | Status: DC

## 2017-10-15 DIAGNOSIS — M4722 Other spondylosis with radiculopathy, cervical region: Secondary | ICD-10-CM | POA: Diagnosis not present

## 2017-10-15 DIAGNOSIS — M542 Cervicalgia: Secondary | ICD-10-CM | POA: Diagnosis not present

## 2017-10-20 ENCOUNTER — Encounter (HOSPITAL_COMMUNITY): Payer: Self-pay | Admitting: Emergency Medicine

## 2017-10-20 ENCOUNTER — Emergency Department (HOSPITAL_COMMUNITY): Payer: Medicare Other

## 2017-10-20 ENCOUNTER — Emergency Department (HOSPITAL_COMMUNITY)
Admission: EM | Admit: 2017-10-20 | Discharge: 2017-10-20 | Disposition: A | Discharge status: Home / Self Care | Payer: Medicare Other | Attending: Emergency Medicine | Admitting: Emergency Medicine

## 2017-10-20 ENCOUNTER — Other Ambulatory Visit: Payer: Self-pay

## 2017-10-20 DIAGNOSIS — E119 Type 2 diabetes mellitus without complications: Secondary | ICD-10-CM | POA: Insufficient documentation

## 2017-10-20 DIAGNOSIS — M25562 Pain in left knee: Secondary | ICD-10-CM | POA: Insufficient documentation

## 2017-10-20 DIAGNOSIS — Z79899 Other long term (current) drug therapy: Secondary | ICD-10-CM | POA: Insufficient documentation

## 2017-10-20 DIAGNOSIS — I10 Essential (primary) hypertension: Secondary | ICD-10-CM | POA: Diagnosis not present

## 2017-10-20 DIAGNOSIS — Z7984 Long term (current) use of oral hypoglycemic drugs: Secondary | ICD-10-CM | POA: Insufficient documentation

## 2017-10-20 DIAGNOSIS — M7732 Calcaneal spur, left foot: Secondary | ICD-10-CM | POA: Diagnosis not present

## 2017-10-20 DIAGNOSIS — M79672 Pain in left foot: Secondary | ICD-10-CM

## 2017-10-20 HISTORY — DX: Other cervical disc degeneration, unspecified cervical region: M50.30

## 2017-10-20 HISTORY — DX: Type 2 diabetes mellitus without complications: E11.9

## 2017-10-20 NOTE — ED Triage Notes (Signed)
Pt is c/o left foot pain x 2 days  Denies injury  Pt states her foot is swollen and the pain radiates up toward her knee

## 2017-10-20 NOTE — Discharge Instructions (Signed)
Please read attached information. If you experience any new or worsening signs or symptoms please return to the emergency room for evaluation. Please follow-up with your primary care provider or specialist as discussed.  °

## 2017-10-20 NOTE — ED Provider Notes (Signed)
[facility] DEPT Provider Note   CSN: [id number] Arrival date & time: 10/20/17  0253     History   Chief Complaint Chief Complaint  Patient presents with  . Foot Pain    HPI Renata Gambino Deberry-Kinn is a 57 y.o. female.  HPI   57 year old female presents today with complaints of foot pain.  Patient notes approximately 3 days ago she developed pain over the left dorsal aspect of her foot with associated swelling.  She notes the symptoms are worse when she is walking, improved with rest.  She notes the swelling is also worsened with ambulation and improved after a night's rest.  She denies any fever or warmth, denies any swelling to the remainder of the extremity.  Patient also reports some minor anterior left knee pain worse with palpation and ambulation.  Patient notes that she has had similar symptoms in the past and was diagnosed with bone spurs.  Patient also notes that she has been taking ibuprofen with minimal relief of her symptoms.  She denies any history of gout.  No history of trauma.  Past Medical History:  Diagnosis Date  . Chronic back pain   . DDD (degenerative disc disease), cervical   . Diabetes mellitus without complication ([place])   . Hypertension    at age 44  . Obesity   . Sinusitis   . Sleep apnea    at age 44    Patient Active Problem List   Diagnosis Date Noted  . Class 3 severe obesity with serious comorbidity and body mass index (BMI) of 45.0 to 49.9 in adult ([place]) 09/12/2017  . Anxiety and depression 06/13/2017  . Cervical radiculopathy due to intervertebral disc disorder 06/13/2017  . Trochanteric bursitis of both hips 05/13/2017  . Strain of right supraspinatus muscle 05/01/2017  . Chronic low back pain without sciatica 09/18/2015  . Degenerative cervical spinal stenosis 06/08/2015  . Vitamin D insufficiency 04/25/2014  . Sinusitis, chronic 08/23/2013  . HTN (hypertension) 06/21/2008    Past Surgical History:    Procedure Laterality Date  . ABDOMINAL HYSTERECTOMY    . BREAST BIOPSY Right 07/02/2006   [place] Core benign  . NASAL SINUS SURGERY      OB History    No data available       Home Medications    Prior to Admission medications   Medication Sig Start Date End Date Taking? Authorizing Provider  amLODipine (NORVASC) 10 MG tablet Take 1 tablet (10 mg total) by mouth daily. 09/16/17   Ladell Pier, MD  Cholecalciferol (VITAMIN D3) 2000 UNITS TABS Take 2,000 Units by mouth daily. 09/19/15   Funches, Adriana Mccallum, MD  cyclobenzaprine (FLEXERIL) 10 MG tablet TAKE 1 TABLET BY MOUTH 3 TIMES DAILY AS NEEDED FOR MUSCLE SPASMS 02/27/17   Funches, Adriana Mccallum, MD  DULoxetine (CYMBALTA) 60 MG capsule Take 1 capsule (60 mg total) by mouth daily. 09/29/17   Ladell Pier, MD  gabapentin (NEURONTIN) 300 MG capsule 1 tab PO Q a.m and noon. 2 tabs PO Q p.m 09/12/17   Ladell Pier, MD  hydrochlorothiazide (HYDRODIURIL) 25 MG tablet Take 1 tablet (25 mg total) by mouth daily. 09/16/17   Ladell Pier, MD  lisinopril (PRINIVIL,ZESTRIL) 40 MG tablet Take 1 tablet (40 mg total) by mouth daily. 09/16/17   Ladell Pier, MD  loratadine (CLARITIN) 10 MG tablet Take 1 tablet (10 mg total) by mouth daily. 08/22/16   Boykin Nearing, MD  meloxicam (MOBIC) 15 MG  tablet TAKE 1 TABLET BY MOUTH DAILY. Patient not taking: Reported on 09/12/2017 03/14/17   Boykin Nearing, MD  metFORMIN (GLUCOPHAGE) 500 MG tablet Take 1 tablet (500 mg total) by mouth daily with breakfast. 09/16/17   Ladell Pier, MD  montelukast (SINGULAIR) 10 MG tablet Take 1 tablet (10 mg total) by mouth at bedtime. 08/22/16   Funches, Adriana Mccallum, MD  omeprazole (PRILOSEC) 20 MG capsule Take 1 capsule (20 mg total) by mouth 2 (two) times daily before a meal. Patient not taking: Reported on 09/12/2017 12/03/16   Boykin Nearing, MD  pregabalin (LYRICA) 50 MG capsule Take 1 capsule (50 mg total) by mouth 3 (three) times daily. For PASS 09/17/17    Ladell Pier, MD  triamcinolone (NASACORT AQ) 55 MCG/ACT AERO nasal inhaler Place 2 sprays into the nose daily. 08/22/16   Boykin Nearing, MD    Family History Family History  Problem Relation Age of Onset  . Hypertension Mother   . Hypertension Other     Social History Social History   Tobacco Use  . Smoking status: Never Smoker  . Smokeless tobacco: Never Used  Substance Use Topics  . Alcohol use: Yes    Alcohol/week: 0.0 oz    Comment: socially  . Drug use: No     Allergies   Milk-related compounds and Codeine sulfate   Review of Systems Review of Systems  All other systems reviewed and are negative.   Physical Exam Updated Vital Signs BP 114/78 (BP Location: Right Leg)   Pulse 72   Temp 97.6 F (36.4 C) (Oral)   Resp 18   Ht 5\' 2"  (1.575 m)   Wt 124.7 kg (275 lb)   LMP 09/01/2013   SpO2 97%   BMI 50.30 kg/m   Physical Exam  Constitutional: She is oriented to person, place, and time. She appears well-developed and well-nourished.  HENT:  Head: Normocephalic and atraumatic.  Eyes: Conjunctivae are normal. Pupils are equal, round, and reactive to light. Right eye exhibits no discharge. Left eye exhibits no discharge. No scleral icterus.  Neck: Normal range of motion. No JVD present. No tracheal deviation present.  Pulmonary/Chest: Effort normal. No stridor.  Musculoskeletal: She exhibits tenderness. She exhibits no edema.  Minor swelling over the dorsum left talonavicular joint-no redness or warmth to touch-ankle full range of motion, no swelling to the remainder of the lower extremity  Left knee with minor tenderness palpation of the anterior joint line  Neurological: She is alert and oriented to person, place, and time. Coordination normal.  Psychiatric: She has a normal mood and affect. Her behavior is normal. Judgment and thought content normal.  Nursing note and vitals reviewed.    ED Treatments / Results  Labs (all labs ordered are  listed, but only abnormal results are displayed) Labs Reviewed - No data to display  EKG  EKG Interpretation None       Radiology Dg Foot Complete Left  Result Date: 10/20/2017 CLINICAL DATA:  57 y/o  F; 2 days of left foot pain. EXAM: LEFT FOOT - COMPLETE 3+ VIEW COMPARISON:  10/28/2014 left foot radiographs FINDINGS: There is no evidence of fracture or dislocation. Lisfranc alignment maintained. Plantar calcaneal bone spur. Soft tissues are unremarkable. IMPRESSION: No acute fracture or dislocation identified. Electronically Signed   By: Kristine Garbe M.D.   On: 10/20/2017 06:18    Procedures Procedures (including critical care time)  Medications Ordered in ED Medications - No data to display   Initial Impression /  Assessment and Plan / ED Course  I have reviewed the triage vital signs and the nursing notes.  Pertinent labs & imaging results that were available during my care of the patient were reviewed by me and considered in my medical decision making (see chart for details).      Final Clinical Impressions(s) / ED Diagnoses   Final diagnoses:  Foot pain, left   Labs:   Imaging: DG foot complete left  Consults:  Therapeutics:  Discharge Meds:   Assessment/Plan: 57 year old female presents today with foot pain.  Patient has very minor swelling localized on the foot, no signs of infectious etiology low suspicion for gout, likely overuse.  Patient without any swelling to the rest of the leg flow suspicion for blood clot.  Patient will be encouraged to use ibuprofen and Tylenol, ice, rest, follow-up with podiatry if symptoms persist return to the emergency room if they worsen.  Patient verbalized understanding and agreement to today's plan had no further questions or concerns at the time of discharge.  ED Discharge Orders    None       Okey Regal, Hershal Coria 10/20/17 7412    Shanon Rosser, MD 10/20/17 [phone]

## 2017-10-20 NOTE — ED Notes (Signed)
Pt left prior to receiving her discharge paperwork

## 2017-10-20 NOTE — ED Notes (Signed)
ED Provider at bedside. 

## 2017-10-27 ENCOUNTER — Other Ambulatory Visit: Payer: Self-pay | Admitting: Neurological Surgery

## 2017-10-27 DIAGNOSIS — M4722 Other spondylosis with radiculopathy, cervical region: Secondary | ICD-10-CM

## 2017-11-09 ENCOUNTER — Ambulatory Visit
Admission: RE | Admit: 2017-11-09 | Discharge: 2017-11-09 | Disposition: A | Discharge status: Home / Self Care | Payer: Medicare Other | Source: Ambulatory Visit | Attending: Neurological Surgery | Admitting: Neurological Surgery

## 2017-11-09 DIAGNOSIS — M4722 Other spondylosis with radiculopathy, cervical region: Secondary | ICD-10-CM

## 2017-11-09 DIAGNOSIS — M5023 Other cervical disc displacement, cervicothoracic region: Secondary | ICD-10-CM | POA: Diagnosis not present

## 2017-11-12 DIAGNOSIS — M4722 Other spondylosis with radiculopathy, cervical region: Secondary | ICD-10-CM | POA: Diagnosis not present

## 2017-12-08 DIAGNOSIS — M4722 Other spondylosis with radiculopathy, cervical region: Secondary | ICD-10-CM | POA: Diagnosis not present

## 2017-12-12 ENCOUNTER — Ambulatory Visit: Payer: Medicare Other | Attending: Internal Medicine | Admitting: Internal Medicine

## 2017-12-12 ENCOUNTER — Encounter: Payer: Self-pay | Admitting: Internal Medicine

## 2017-12-12 ENCOUNTER — Other Ambulatory Visit: Payer: Self-pay

## 2017-12-12 VITALS — BP 116/84 | HR 72 | Temp 98.2°F | Resp 16 | Wt 267.4 lb

## 2017-12-12 DIAGNOSIS — I10 Essential (primary) hypertension: Secondary | ICD-10-CM | POA: Diagnosis not present

## 2017-12-12 DIAGNOSIS — Z8249 Family history of ischemic heart disease and other diseases of the circulatory system: Secondary | ICD-10-CM | POA: Diagnosis not present

## 2017-12-12 DIAGNOSIS — Z1211 Encounter for screening for malignant neoplasm of colon: Secondary | ICD-10-CM | POA: Diagnosis not present

## 2017-12-12 DIAGNOSIS — G8929 Other chronic pain: Secondary | ICD-10-CM | POA: Diagnosis not present

## 2017-12-12 DIAGNOSIS — Z6841 Body Mass Index (BMI) 40.0 and over, adult: Secondary | ICD-10-CM | POA: Insufficient documentation

## 2017-12-12 DIAGNOSIS — E559 Vitamin D deficiency, unspecified: Secondary | ICD-10-CM | POA: Diagnosis not present

## 2017-12-12 DIAGNOSIS — Z79899 Other long term (current) drug therapy: Secondary | ICD-10-CM | POA: Diagnosis not present

## 2017-12-12 DIAGNOSIS — Z885 Allergy status to narcotic agent status: Secondary | ICD-10-CM | POA: Diagnosis not present

## 2017-12-12 DIAGNOSIS — M5010 Cervical disc disorder with radiculopathy, unspecified cervical region: Secondary | ICD-10-CM | POA: Insufficient documentation

## 2017-12-12 DIAGNOSIS — F329 Major depressive disorder, single episode, unspecified: Secondary | ICD-10-CM | POA: Diagnosis not present

## 2017-12-12 DIAGNOSIS — E119 Type 2 diabetes mellitus without complications: Secondary | ICD-10-CM | POA: Insufficient documentation

## 2017-12-12 DIAGNOSIS — Z7951 Long term (current) use of inhaled steroids: Secondary | ICD-10-CM | POA: Insufficient documentation

## 2017-12-12 DIAGNOSIS — Z90710 Acquired absence of both cervix and uterus: Secondary | ICD-10-CM | POA: Diagnosis not present

## 2017-12-12 DIAGNOSIS — F419 Anxiety disorder, unspecified: Secondary | ICD-10-CM | POA: Diagnosis not present

## 2017-12-12 DIAGNOSIS — M5416 Radiculopathy, lumbar region: Secondary | ICD-10-CM | POA: Diagnosis not present

## 2017-12-12 DIAGNOSIS — F32A Depression, unspecified: Secondary | ICD-10-CM

## 2017-12-12 LAB — GLUCOSE, POCT (MANUAL RESULT ENTRY): POC Glucose: 104 mg/dl — AB (ref 70–99)

## 2017-12-12 MED ORDER — DULOXETINE HCL 60 MG PO CPEP: 60.0000 mg | ORAL_CAPSULE | Freq: Every day | ORAL | 3 refills | Status: DC

## 2017-12-12 NOTE — Progress Notes (Signed)
Pt here for f/u - Neck and discuss medications: Cymbalta

## 2017-12-12 NOTE — Patient Instructions (Addendum)
Please schedule an eye exam to have vision checked.   Please call Dr. Hewitt Shorts office and schedule an appointment to be seen for your lower back pain.   Diabetes Mellitus and Nutrition When you have diabetes (diabetes mellitus), it is very important to have healthy eating habits because your blood sugar (glucose) levels are greatly affected by what you eat and drink. Eating healthy foods in the appropriate amounts, at about the same times every day, can help you:  Control your blood glucose.  Lower your risk of heart disease.  Improve your blood pressure.  Reach or maintain a healthy weight.  Every person with diabetes is different, and each person has different needs for a meal plan. Your health care provider may recommend that you work with a diet and nutrition specialist (dietitian) to make a meal plan that is best for you. Your meal plan may vary depending on factors such as:  The calories you need.  The medicines you take.  Your weight.  Your blood glucose, blood pressure, and cholesterol levels.  Your activity level.  Other health conditions you have, such as heart or kidney disease.  How do carbohydrates affect me? Carbohydrates affect your blood glucose level more than any other type of food. Eating carbohydrates naturally increases the amount of glucose in your blood. Carbohydrate counting is a method for keeping track of how many carbohydrates you eat. Counting carbohydrates is important to keep your blood glucose at a healthy level, especially if you use insulin or take certain oral diabetes medicines. It is important to know how many carbohydrates you can safely have in each meal. This is different for every person. Your dietitian can help you calculate how many carbohydrates you should have at each meal and for snack. Foods that contain carbohydrates include:  Bread, cereal, rice, pasta, and crackers.  Potatoes and corn.  Peas, beans, and lentils.  Milk and  yogurt.  Fruit and juice.  Desserts, such as cakes, cookies, ice cream, and candy.  How does alcohol affect me? Alcohol can cause a sudden decrease in blood glucose (hypoglycemia), especially if you use insulin or take certain oral diabetes medicines. Hypoglycemia can be a life-threatening condition. Symptoms of hypoglycemia (sleepiness, dizziness, and confusion) are similar to symptoms of having too much alcohol. If your health care provider says that alcohol is safe for you, follow these guidelines:  Limit alcohol intake to no more than 1 drink per day for nonpregnant women and 2 drinks per day for men. One drink equals 12 oz of beer, 5 oz of wine, or 1 oz of hard liquor.  Do not drink on an empty stomach.  Keep yourself hydrated with water, diet soda, or unsweetened iced tea.  Keep in mind that regular soda, juice, and other mixers may contain a lot of sugar and must be counted as carbohydrates.  What are tips for following this plan? Reading food labels  Start by checking the serving size on the label. The amount of calories, carbohydrates, fats, and other nutrients listed on the label are based on one serving of the food. Many foods contain more than one serving per package.  Check the total grams (g) of carbohydrates in one serving. You can calculate the number of servings of carbohydrates in one serving by dividing the total carbohydrates by 15. For example, if a food has 30 g of total carbohydrates, it would be equal to 2 servings of carbohydrates.  Check the number of grams (g) of saturated and  trans fats in one serving. Choose foods that have low or no amount of these fats.  Check the number of milligrams (mg) of sodium in one serving. Most people should limit total sodium intake to less than 2,300 mg per day.  Always check the nutrition information of foods labeled as "low-fat" or "nonfat". These foods may be higher in added sugar or refined carbohydrates and should be  avoided.  Talk to your dietitian to identify your daily goals for nutrients listed on the label. Shopping  Avoid buying canned, premade, or processed foods. These foods tend to be high in fat, sodium, and added sugar.  Shop around the outside edge of the grocery store. This includes fresh fruits and vegetables, bulk grains, fresh meats, and fresh dairy. Cooking  Use low-heat cooking methods, such as baking, instead of high-heat cooking methods like deep frying.  Cook using healthy oils, such as olive, canola, or sunflower oil.  Avoid cooking with butter, cream, or high-fat meats. Meal planning  Eat meals and snacks regularly, preferably at the same times every day. Avoid going long periods of time without eating.  Eat foods high in fiber, such as fresh fruits, vegetables, beans, and whole grains. Talk to your dietitian about how many servings of carbohydrates you can eat at each meal.  Eat 4-6 ounces of lean protein each day, such as lean meat, chicken, fish, eggs, or tofu. 1 ounce is equal to 1 ounce of meat, chicken, or fish, 1 egg, or 1/4 cup of tofu.  Eat some foods each day that contain healthy fats, such as avocado, nuts, seeds, and fish. Lifestyle   Check your blood glucose regularly.  Exercise at least 30 minutes 5 or more days each week, or as told by your health care provider.  Take medicines as told by your health care provider.  Do not use any products that contain nicotine or tobacco, such as cigarettes and e-cigarettes. If you need help quitting, ask your health care provider.  Work with a Social worker or diabetes educator to identify strategies to manage stress and any emotional and social challenges. What are some questions to ask my health care provider?  Do I need to meet with a diabetes educator?  Do I need to meet with a dietitian?  What number can I call if I have questions?  When are the best times to check my blood glucose? Where to find more  information:  American Diabetes Association: diabetes.org/food-and-fitness/food  Academy of Nutrition and Dietetics: PokerClues.dk  [employer] of Diabetes and Digestive and Kidney Diseases (NIH): ContactWire.be Summary  A healthy meal plan will help you control your blood glucose and maintain a healthy lifestyle.  Working with a diet and nutrition specialist (dietitian) can help you make a meal plan that is best for you.  Keep in mind that carbohydrates and alcohol have immediate effects on your blood glucose levels. It is important to count carbohydrates and to use alcohol carefully. This information is not intended to replace advice given to you by your health care provider. Make sure you discuss any questions you have with your health care provider. Document Released: 06/27/2005 Document Revised: 11/04/2016 Document Reviewed: 11/04/2016 Elsevier Interactive Patient Education  [employer].

## 2017-12-12 NOTE — Progress Notes (Signed)
Patient ID: Michele Wood, female    DOB: August 09, 1961  MRN: [id number]  CC: Follow-up   Subjective: Michele Wood is a 57 y.o. female who presents for chronic ds management. Husband, Michele Wood, is with her Her concerns today include:  Hx of HTN, chronic sinusitis, degen disc of c-spine, chronic LBP, vit D def, obesity, anx/dep, new DM  1.  New DM based on A1C 6.8 on labs from last visit. -BS: did get meter and has been checking  TID.  Not sure of what range should be.  She was checking 30 mins after a meal and it has been in 200s.  A.m range 80-90 Med: she has tried to cut back on white carbs, eating more veggies,.  "I love fries, I must admit.".  Exercise: she signed up for scholarship at [employer] but has not heard back from them as yet  2.  Dep/Anx:  She would like to talk with a counselor.  "I am going through a lot of changes and I would like to get my mind right." -out of Cymbalta since Christmas, she had called for RF but never rec call back.  She found it helpful.  3.  LBP: "hard for me to walk."  After walking for 20 mins, she gets a tight feeling in lower back "then my bowels want to let loose."  Going on for mths but more intense over past 2 mths.  No urinary incontinence.  No saddle anesthesia. Problem started before she was placed on Metformin in Nov, 2018. -pain radiates down RT leg.  + numbness in this leg.  Worse when "I push myself." -seeing Dr. Cyndy Freeze for her neck.  Just had a ESI to neck which was helpful.   4. HTN:   Compliant with meds  HM: turned in FIT test but I have not rec results    Patient Active Problem List   Diagnosis Date Noted  . Class 3 severe obesity with serious comorbidity and body mass index (BMI) of 45.0 to 49.9 in adult ([place]) 09/12/2017  . Anxiety and depression 06/13/2017  . Cervical radiculopathy due to intervertebral disc disorder 06/13/2017  . Trochanteric bursitis of both hips 05/13/2017  . Strain of right supraspinatus  muscle 05/01/2017  . Chronic low back pain without sciatica 09/18/2015  . Degenerative cervical spinal stenosis 06/08/2015  . Vitamin D insufficiency 04/25/2014  . Sinusitis, chronic 08/23/2013  . HTN (hypertension) 06/21/2008     Current Outpatient Medications on File Prior to Visit  Medication Sig Dispense Refill  . amLODipine (NORVASC) 10 MG tablet Take 1 tablet (10 mg total) by mouth daily. 30 tablet 2  . Cholecalciferol (VITAMIN D3) 2000 UNITS TABS Take 2,000 Units by mouth daily. 30 tablet 11  . cyclobenzaprine (FLEXERIL) 10 MG tablet TAKE 1 TABLET BY MOUTH 3 TIMES DAILY AS NEEDED FOR MUSCLE SPASMS 60 tablet 0  . hydrochlorothiazide (HYDRODIURIL) 25 MG tablet Take 1 tablet (25 mg total) by mouth daily. 30 tablet 2  . lisinopril (PRINIVIL,ZESTRIL) 40 MG tablet Take 1 tablet (40 mg total) by mouth daily. 30 tablet 2  . loratadine (CLARITIN) 10 MG tablet Take 1 tablet (10 mg total) by mouth daily. 30 tablet 11  . montelukast (SINGULAIR) 10 MG tablet Take 1 tablet (10 mg total) by mouth at bedtime. 30 tablet 3  . pregabalin (LYRICA) 50 MG capsule Take 1 capsule (50 mg total) by mouth 3 (three) times daily. For PASS 270 capsule 3  . triamcinolone (NASACORT AQ) 55  MCG/ACT AERO nasal inhaler Place 2 sprays into the nose daily. 1 Inhaler 12  . meloxicam (MOBIC) 15 MG tablet TAKE 1 TABLET BY MOUTH DAILY. (Patient not taking: Reported on 09/12/2017) 30 tablet 0  . metFORMIN (GLUCOPHAGE) 500 MG tablet Take 1 tablet (500 mg total) by mouth daily with breakfast. (Patient not taking: Reported on 12/12/2017) 90 tablet 3  . omeprazole (PRILOSEC) 20 MG capsule Take 1 capsule (20 mg total) by mouth 2 (two) times daily before a meal. (Patient not taking: Reported on 09/12/2017) 30 capsule 0   No current facility-administered medications on file prior to visit.     Allergies  Allergen Reactions  . Milk-Related Compounds Diarrhea and Other (See Comments)    Diarrhea and stomach upset  . Codeine Sulfate  Rash    Social History   Socioeconomic History  . Marital status: Married    Spouse name: Not on file  . Number of children: Not on file  . Years of education: Not on file  . Highest education level: Not on file  Social Needs  . Financial resource strain: Not on file  . Food insecurity - worry: Not on file  . Food insecurity - inability: Not on file  . Transportation needs - medical: Not on file  . Transportation needs - non-medical: Not on file  Occupational History  . Not on file  Tobacco Use  . Smoking status: Never Smoker  . Smokeless tobacco: Never Used  Substance and Sexual Activity  . Alcohol use: Yes    Alcohol/week: 0.0 oz    Comment: socially  . Drug use: No  . Sexual activity: Not on file  Other Topics Concern  . Not on file  Social History Narrative  . Not on file    Family History  Problem Relation Age of Onset  . Hypertension Mother   . Hypertension Other     Past Surgical History:  Procedure Laterality Date  . ABDOMINAL HYSTERECTOMY    . BREAST BIOPSY Right 07/02/2006   [place] Core benign  . NASAL SINUS SURGERY      ROS: Review of Systems As above PHYSICAL EXAM: BP 116/84 (BP Location: Right Arm, Patient Position: Sitting, Cuff Size: Large)   Pulse 72   Temp 98.2 F (36.8 C) (Oral)   Resp 16   Wt 267 lb 6.4 oz (121.3 kg)   LMP 09/01/2013   SpO2 97%   BMI 48.91 kg/m   Physical Exam  General appearance - alert, well appearing, and in no distress Mental status - alert, oriented to person, place, and time, normal mood, behavior, speech, dress, motor activity, and thought processes Neck - supple, no significant adenopathy Chest - clear to auscultation, no wheezes, rales or rhonchi, symmetric air entry Heart - normal rate, regular rhythm, normal S1, S2, no murmurs, rubs, clicks or gallops Neurological - ambulates with cane.  Power LEs 5/5 BL, gross sensation intact in LEs Musculoskeletal -mild tenderness on palpation of LS spine Extremities  -no LE edema  Results for orders placed or performed in visit on 12/12/17  POCT glucose (manual entry)  Result Value Ref Range   POC Glucose 104 (A) 70 - 99 mg/dl   Lab Results  Component Value Date   HGBA1C 6.8 (H) 09/12/2017     Chemistry      Component Value Date/Time   NA 140 09/12/2017 1140   K 3.8 09/12/2017 1140   CL 97 09/12/2017 1140   CO2 28 09/12/2017 1140   BUN  12 09/12/2017 1140   CREATININE 0.82 09/12/2017 1140   CREATININE 0.85 04/25/2015 1602      Component Value Date/Time   CALCIUM 9.5 09/12/2017 1140   ALKPHOS 83 09/12/2017 1140   AST 13 09/12/2017 1140   ALT 13 09/12/2017 1140   BILITOT 0.2 09/12/2017 1140     Lab Results  Component Value Date   WBC 7.2 09/12/2017   HGB 12.5 09/12/2017   HCT 36.8 09/12/2017   MCV 79 09/12/2017   PLT 322 09/12/2017    ASSESSMENT AND PLAN: 1. New onset type 2 diabetes mellitus ([place]) -Discussed blood sugar goals before meals and 2 hours after meal.  I also did some basic nutrition teaching.  Encourage her to cut back on white carbohydrates.  Avoid sugary drinks.  Try to drink more water.  Incorporate fresh fruits and vegetables into the diet.  I also had a clinical pharmacist speak with her today for some basic diabetic teaching. -Encourage patient to call the [employer] to follow-up with scholarship application so that she can get started exercising right ear. - POCT glucose (manual entry) - Microalbumin / creatinine urine ratio  2. Anxiety and depression - DULoxetine (CYMBALTA) 60 MG capsule; Take 1 capsule (60 mg total) by mouth daily.  Dispense: 90 capsule; Refill: 3 - Ambulatory referral to Psychiatry  3. Lumbar radiculopathy -report of bowel incontinence intermittently x several mths is concerning - Ambulatory referral to Neurosurgery -patient advised to call her neurosurgeon and request a follow-up appointment for evaluation of lumbar radiculopathy.  She is already seen Dr. Cyndy Freeze at [facility] for her  neck.  I will submit a referral for her back. - MR Lumbar Spine Wo Contrast; Future  4. Essential hypertension At goal.  5. Colon cancer screening - Fecal occult blood, imunochemical([facility])  Patient was given the opportunity to ask questions.  Patient verbalized understanding of the plan and was able to repeat key elements of the plan.   Orders Placed This Encounter  Procedures  . Fecal occult blood, imunochemical([facility])  . MR Lumbar Spine Wo Contrast  . Microalbumin / creatinine urine ratio  . Ambulatory referral to Psychiatry  . Ambulatory referral to Neurosurgery  . POCT glucose (manual entry)     Requested Prescriptions   Signed Prescriptions Disp Refills  . DULoxetine (CYMBALTA) 60 MG capsule 90 capsule 3    Sig: Take 1 capsule (60 mg total) by mouth daily.    Return in about 3 months (around 03/14/2018).  Karle Plumber, MD, FACP

## 2017-12-13 LAB — MICROALBUMIN / CREATININE URINE RATIO
Creatinine, Urine: 116.7 mg/dL
Microalb/Creat Ratio: <2.6 mg/g creat (ref 0.0–30.0)
Microalbumin, Urine: <3 ug/mL

## 2017-12-19 ENCOUNTER — Ambulatory Visit (HOSPITAL_COMMUNITY)
Admission: RE | Admit: 2017-12-19 | Discharge: 2017-12-19 | Disposition: A | Discharge status: Home / Self Care | Payer: Medicare Other | Source: Ambulatory Visit | Attending: Internal Medicine | Admitting: Internal Medicine

## 2017-12-19 DIAGNOSIS — M5416 Radiculopathy, lumbar region: Secondary | ICD-10-CM | POA: Diagnosis present

## 2017-12-19 DIAGNOSIS — M4316 Spondylolisthesis, lumbar region: Secondary | ICD-10-CM | POA: Insufficient documentation

## 2017-12-19 DIAGNOSIS — M4726 Other spondylosis with radiculopathy, lumbar region: Secondary | ICD-10-CM | POA: Insufficient documentation

## 2017-12-23 ENCOUNTER — Telehealth: Payer: Self-pay

## 2017-12-23 NOTE — Telephone Encounter (Signed)
Contacted pt to go over MRI results pt is aware and doesn't have any questions or concerns  

## 2017-12-24 LAB — FECAL OCCULT BLOOD, IMMUNOCHEMICAL: FECAL OCCULT BLD: NEGATIVE

## 2017-12-29 ENCOUNTER — Telehealth: Payer: Self-pay | Admitting: Internal Medicine

## 2017-12-29 DIAGNOSIS — I10 Essential (primary) hypertension: Secondary | ICD-10-CM

## 2017-12-29 MED ORDER — AMLODIPINE BESYLATE 10 MG PO TABS: 10.0000 mg | ORAL_TABLET | Freq: Every day | ORAL | 2 refills | Status: DC

## 2017-12-29 MED ORDER — HYDROCHLOROTHIAZIDE 25 MG PO TABS: 25.0000 mg | ORAL_TABLET | Freq: Every day | ORAL | 2 refills | Status: DC

## 2017-12-29 MED ORDER — LISINOPRIL 40 MG PO TABS: 40.0000 mg | ORAL_TABLET | Freq: Every day | ORAL | 2 refills | Status: DC

## 2017-12-29 NOTE — Telephone Encounter (Signed)
Refilled

## 2017-12-29 NOTE — Telephone Encounter (Signed)
Patient called and requested for medications listed  lisinopril (PRINIVIL,ZESTRIL) 40 MG tablet [[id number]]  amLODipine (NORVASC) 10 MG tablet [[id number]] hydrochlorothiazide (HYDRODIURIL) 25 MG tablet [[id number]]  Please send to [facility] on [address]

## 2018-01-05 DIAGNOSIS — M4722 Other spondylosis with radiculopathy, cervical region: Secondary | ICD-10-CM | POA: Diagnosis not present

## 2018-01-21 ENCOUNTER — Ambulatory Visit (INDEPENDENT_AMBULATORY_CARE_PROVIDER_SITE_OTHER): Payer: Medicare Other | Admitting: Psychiatry

## 2018-01-21 ENCOUNTER — Encounter (HOSPITAL_COMMUNITY): Payer: Self-pay | Admitting: Psychiatry

## 2018-01-21 VITALS — BP 142/80 | HR 84 | Ht 62.0 in | Wt 261.0 lb

## 2018-01-21 DIAGNOSIS — E119 Type 2 diabetes mellitus without complications: Secondary | ICD-10-CM | POA: Diagnosis not present

## 2018-01-21 DIAGNOSIS — M2550 Pain in unspecified joint: Secondary | ICD-10-CM | POA: Diagnosis not present

## 2018-01-21 DIAGNOSIS — R440 Auditory hallucinations: Secondary | ICD-10-CM

## 2018-01-21 DIAGNOSIS — G4700 Insomnia, unspecified: Secondary | ICD-10-CM | POA: Diagnosis not present

## 2018-01-21 DIAGNOSIS — F329 Major depressive disorder, single episode, unspecified: Secondary | ICD-10-CM

## 2018-01-21 DIAGNOSIS — Z818 Family history of other mental and behavioral disorders: Secondary | ICD-10-CM | POA: Diagnosis not present

## 2018-01-21 DIAGNOSIS — Z9149 Other personal history of psychological trauma, not elsewhere classified: Secondary | ICD-10-CM | POA: Diagnosis not present

## 2018-01-21 DIAGNOSIS — F331 Major depressive disorder, recurrent, moderate: Secondary | ICD-10-CM

## 2018-01-21 DIAGNOSIS — F419 Anxiety disorder, unspecified: Secondary | ICD-10-CM | POA: Diagnosis not present

## 2018-01-21 DIAGNOSIS — M549 Dorsalgia, unspecified: Secondary | ICD-10-CM

## 2018-01-21 MED ORDER — DULOXETINE HCL 30 MG PO CPEP
90.0000 mg | ORAL_CAPSULE | Freq: Every day | ORAL | 0 refills | Status: DC
Start: 2018-01-21 — End: 2018-03-04

## 2018-01-21 MED ORDER — HALOPERIDOL 1 MG PO TABS: 1.0000 mg | ORAL_TABLET | Freq: Every day | ORAL | 0 refills | Status: DC
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
## 2018-01-28 ENCOUNTER — Telehealth (HOSPITAL_COMMUNITY): Payer: Self-pay

## 2018-01-28 ENCOUNTER — Other Ambulatory Visit (HOSPITAL_COMMUNITY): Payer: Self-pay | Admitting: Psychiatry

## 2018-01-28 NOTE — Telephone Encounter (Signed)
She can try Latuda 20 mg.

## 2018-01-28 NOTE — Telephone Encounter (Signed)
Called in prescription to [facility], 30 tabs no refills.

## 2018-01-28 NOTE — Telephone Encounter (Signed)
Received fax from pharmacy stating that Haloperidol 1mg  tablet is on back order, but the pharmacy has 5mg  tabs and 10mg  tabs. Please advise

## 2018-02-03 DIAGNOSIS — M4722 Other spondylosis with radiculopathy, cervical region: Secondary | ICD-10-CM | POA: Diagnosis not present

## 2018-03-04 ENCOUNTER — Ambulatory Visit (INDEPENDENT_AMBULATORY_CARE_PROVIDER_SITE_OTHER): Payer: Medicare Other | Admitting: Psychiatry

## 2018-03-04 ENCOUNTER — Encounter (HOSPITAL_COMMUNITY): Payer: Self-pay | Admitting: Psychiatry

## 2018-03-04 VITALS — BP 132/80 | HR 73 | Ht 62.0 in | Wt 259.0 lb

## 2018-03-04 DIAGNOSIS — F331 Major depressive disorder, recurrent, moderate: Secondary | ICD-10-CM

## 2018-03-04 DIAGNOSIS — Z818 Family history of other mental and behavioral disorders: Secondary | ICD-10-CM | POA: Diagnosis not present

## 2018-03-04 MED ORDER — LURASIDONE HCL 40 MG PO TABS: 40.0000 mg | ORAL_TABLET | Freq: Every day | ORAL | 1 refills | Status: DC

## 2018-03-04 MED ORDER — DULOXETINE HCL 30 MG PO CPEP: 90.0000 mg | ORAL_CAPSULE | Freq: Every day | ORAL | 1 refills | Status: DC
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
## 2018-03-12 DIAGNOSIS — M4722 Other spondylosis with radiculopathy, cervical region: Secondary | ICD-10-CM | POA: Diagnosis not present

## 2018-03-16 ENCOUNTER — Encounter: Payer: Self-pay | Admitting: Internal Medicine

## 2018-03-16 ENCOUNTER — Ambulatory Visit: Payer: Medicare Other | Attending: Internal Medicine | Admitting: Internal Medicine

## 2018-03-16 VITALS — BP 115/77 | HR 78 | Temp 98.4°F | Resp 16 | Wt 259.2 lb

## 2018-03-16 DIAGNOSIS — Z8249 Family history of ischemic heart disease and other diseases of the circulatory system: Secondary | ICD-10-CM | POA: Diagnosis not present

## 2018-03-16 DIAGNOSIS — M5416 Radiculopathy, lumbar region: Secondary | ICD-10-CM | POA: Diagnosis not present

## 2018-03-16 DIAGNOSIS — Z7984 Long term (current) use of oral hypoglycemic drugs: Secondary | ICD-10-CM | POA: Insufficient documentation

## 2018-03-16 DIAGNOSIS — Z9189 Other specified personal risk factors, not elsewhere classified: Secondary | ICD-10-CM | POA: Diagnosis not present

## 2018-03-16 DIAGNOSIS — Z9889 Other specified postprocedural states: Secondary | ICD-10-CM | POA: Diagnosis not present

## 2018-03-16 DIAGNOSIS — Z6841 Body Mass Index (BMI) 40.0 and over, adult: Secondary | ICD-10-CM

## 2018-03-16 DIAGNOSIS — Z90710 Acquired absence of both cervix and uterus: Secondary | ICD-10-CM | POA: Diagnosis not present

## 2018-03-16 DIAGNOSIS — I10 Essential (primary) hypertension: Secondary | ICD-10-CM | POA: Diagnosis not present

## 2018-03-16 DIAGNOSIS — E119 Type 2 diabetes mellitus without complications: Secondary | ICD-10-CM

## 2018-03-16 DIAGNOSIS — G8929 Other chronic pain: Secondary | ICD-10-CM | POA: Diagnosis not present

## 2018-03-16 DIAGNOSIS — M5412 Radiculopathy, cervical region: Secondary | ICD-10-CM | POA: Diagnosis not present

## 2018-03-16 DIAGNOSIS — Z79899 Other long term (current) drug therapy: Secondary | ICD-10-CM | POA: Insufficient documentation

## 2018-03-16 DIAGNOSIS — F329 Major depressive disorder, single episode, unspecified: Secondary | ICD-10-CM | POA: Diagnosis not present

## 2018-03-16 DIAGNOSIS — F419 Anxiety disorder, unspecified: Secondary | ICD-10-CM | POA: Insufficient documentation

## 2018-03-16 DIAGNOSIS — E559 Vitamin D deficiency, unspecified: Secondary | ICD-10-CM | POA: Insufficient documentation

## 2018-03-16 LAB — POCT GLYCOSYLATED HEMOGLOBIN (HGB A1C): HbA1c, POC (controlled diabetic range): 6.5 % (ref 0.0–7.0)

## 2018-03-16 LAB — GLUCOSE, POCT (MANUAL RESULT ENTRY): POC GLUCOSE: 163 mg/dL — AB (ref 70–99)

## 2018-03-16 NOTE — Patient Instructions (Signed)
Keep up the good works with regular exercise and healthy food choices.

## 2018-03-16 NOTE — Progress Notes (Signed)
Patient ID: Michele Wood, female    DOB: May 19, 1961  MRN: [id number]  CC: Referral (Dentist) and Diabetes   Subjective: Michele Wood is a 57 y.o. female who presents for chronic ds management.  Spouse is with her. Her concerns today include:  Hx of HTN, chronic sinusitis, degen disc of c-spine, chronic LBP, vit D def, obesity, anx/dep, new DM  Anxiety/dep:  Seeing Dr. Adele Schilder.  Diagnosed with depression with psychosis.  Dose of Cymbalta increased.  Latuda was added.  He recommends that she see a therapist also.  Chronic LBP: MRI of the lumbar spine done on last visit.  No compressive lesions seen.  Did show some facet arthropathy.  She was referred to Dr. Cyndy Freeze she has been seeing for her neck issues.  However his office reports that they did not get a referral from [place].  DM: Compliant with metformin.  Starting swimming 3 x a wk for 1.5 hrs  "I try to get out of the house more so I'm not eating all the time." Exercise:  Can walk about a block before she gets tired or back starts bothering her.  She has 3 large dogs and would like to help her husband out more with walking them.  Her husband walks about 3 miles with them.  She is wanting to know however that she can get a mobility chair or scooter to use outside the house to walk her dogs.  Reports that her house is not large enough to accommodate it inside the house.  Feels she needs a mobility chair because her arms feel weak even with the injections that she gets to the neck Arms being weak even with the shots. Eating habits: she has cut out red meat and fried foods Loss 16 lbs since 10/2017  Dental:  Inc teeth sensitivity with certain foods.  Request dental referral  HTN:  Compliant with meds Patient Active Problem List   Diagnosis Date Noted  . New onset type 2 diabetes mellitus ([place]) 12/12/2017  . Class 3 severe obesity with serious comorbidity and body mass index (BMI) of 45.0 to 49.9 in adult ([place])  09/12/2017  . Anxiety and depression 06/13/2017  . Cervical radiculopathy due to intervertebral disc disorder 06/13/2017  . Trochanteric bursitis of both hips 05/13/2017  . Strain of right supraspinatus muscle 05/01/2017  . Chronic low back pain without sciatica 09/18/2015  . Degenerative cervical spinal stenosis 06/08/2015  . Vitamin D insufficiency 04/25/2014  . Sinusitis, chronic 08/23/2013  . HTN (hypertension) 06/21/2008     Current Outpatient Medications on File Prior to Visit  Medication Sig Dispense Refill  . amLODipine (NORVASC) 10 MG tablet Take 1 tablet (10 mg total) by mouth daily. 30 tablet 2  . Cholecalciferol (VITAMIN D3) 2000 UNITS TABS Take 2,000 Units by mouth daily. 30 tablet 11  . cyclobenzaprine (FLEXERIL) 10 MG tablet TAKE 1 TABLET BY MOUTH 3 TIMES DAILY AS NEEDED FOR MUSCLE SPASMS 60 tablet 0  . DULoxetine (CYMBALTA) 30 MG capsule Take 3 capsules (90 mg total) by mouth daily. 90 capsule 1  . hydrochlorothiazide (HYDRODIURIL) 25 MG tablet Take 1 tablet (25 mg total) by mouth daily. 30 tablet 2  . lisinopril (PRINIVIL,ZESTRIL) 40 MG tablet Take 1 tablet (40 mg total) by mouth daily. 30 tablet 2  . loratadine (CLARITIN) 10 MG tablet Take 1 tablet (10 mg total) by mouth daily. 30 tablet 11  . lurasidone (LATUDA) 40 MG TABS tablet Take 1 tablet (40 mg total) by  mouth daily with breakfast. 30 tablet 1  . meloxicam (MOBIC) 15 MG tablet TAKE 1 TABLET BY MOUTH DAILY. 30 tablet 0  . metFORMIN (GLUCOPHAGE) 500 MG tablet Take 1 tablet (500 mg total) by mouth daily with breakfast. 90 tablet 3  . montelukast (SINGULAIR) 10 MG tablet Take 1 tablet (10 mg total) by mouth at bedtime. 30 tablet 3  . omeprazole (PRILOSEC) 20 MG capsule Take 1 capsule (20 mg total) by mouth 2 (two) times daily before a meal. 30 capsule 0  . pregabalin (LYRICA) 50 MG capsule Take 1 capsule (50 mg total) by mouth 3 (three) times daily. For PASS 270 capsule 3  . triamcinolone (NASACORT AQ) 55 MCG/ACT AERO  nasal inhaler Place 2 sprays into the nose daily. 1 Inhaler 12   No current facility-administered medications on file prior to visit.     Allergies  Allergen Reactions  . Milk-Related Compounds Diarrhea and Other (See Comments)    Diarrhea and stomach upset  . Codeine Sulfate Rash    Social History   Socioeconomic History  . Marital status: Married    Spouse name: Michele Wood  . Number of children: 2  . Years of education: Not on file  . Highest education level: Some college, no degree  Occupational History  . Not on file  Social Needs  . Financial resource strain: Very hard  . Food insecurity:    Worry: Often true    Inability: Often true  . Transportation needs:    Medical: No    Non-medical: No  Tobacco Use  . Smoking status: Never Smoker  . Smokeless tobacco: Never Used  Substance and Sexual Activity  . Alcohol use: Yes    Alcohol/week: 0.0 oz    Comment: rare  . Drug use: No  . Sexual activity: Not Currently  Lifestyle  . Physical activity:    Days per week: 7 days    Minutes per session: 60 min  . Stress: Very much  Relationships  . Social connections:    Talks on phone: Three times a week    Gets together: Not on file    Attends religious service: Not on file    Active member of club or organization: Not on file    Attends meetings of clubs or organizations: Not on file    Relationship status: Not on file  . Intimate partner violence:    Fear of current or ex partner: No    Emotionally abused: No    Physically abused: No    Forced sexual activity: No  Other Topics Concern  . Not on file  Social History Narrative  . Not on file    Family History  Problem Relation Age of Onset  . Hypertension Mother   . Hypertension Other   . Schizophrenia Brother     Past Surgical History:  Procedure Laterality Date  . ABDOMINAL HYSTERECTOMY    . BREAST BIOPSY Right 07/02/2006   [place] Core benign  . NASAL SINUS SURGERY      ROS: Review of Systems Neg except  as above PHYSICAL EXAM: BP 115/77   Pulse 78   Temp 98.4 F (36.9 C) (Oral)   Resp 16   Wt 259 lb 3.2 oz (117.6 kg)   LMP 09/01/2013   SpO2 95%   BMI 47.41 kg/m   Wt Readings from Last 3 Encounters:  03/16/18 259 lb 3.2 oz (117.6 kg)  03/04/18 259 lb (117.5 kg)  01/21/18 261 lb (118.4 kg)  Physical Exam General appearance - alert, well appearing, and in no distress Mental status - normal mood, behavior, speech, dress, motor activity, and thought processes Neck - supple, no significant adenopathy Chest - clear to auscultation, no wheezes, rales or rhonchi, symmetric air entry Heart - normal rate, regular rhythm, normal S1, S2, no murmurs, rubs, clicks or gallops Musculoskeletal -ambulates with a cane.   Neuro: Grip 5 out of 5 bilaterally.  Power in upper extremities proximal and distally 5/5 bilaterally.  Power lower extremities 5/5 bilaterally.   Extremities - peripheral pulses normal, no pedal edema, no clubbing or cyanosis  Results for orders placed or performed in visit on 03/16/18  POCT glucose (manual entry)  Result Value Ref Range   POC Glucose 163 (A) 70 - 99 mg/dl  POCT glycosylated hemoglobin (Hb A1C)  Result Value Ref Range   Hemoglobin A1C  4.0 - 5.6 %   HbA1c, POC (prediabetic range)  5.7 - 6.4 %   HbA1c, POC (controlled diabetic range) 6.5 0.0 - 7.0 %     ASSESSMENT AND PLAN: 1. Controlled type 2 diabetes mellitus without complication, without long-term current use of insulin ([place]) Commended her on weight loss, positive changes in eating habits and increase physical activity.  Encouraged her to keep up the good works. - POCT glucose (manual entry) - POCT glycosylated hemoglobin (Hb A1C)  2. Class 3 severe obesity due to excess calories with serious comorbidity and body mass index (BMI) of 45.0 to 49.9 in adult [facility]) See #1 above. -She does not qualify for motorized wheelchair.  On physical exam her strength is good.  Also mobility chairs are to help more  with decreased mobility issues in the home. -Encourage her when she walks to do walk rest cycles of 10 minutes.  3. Lumbar radiculopathy Message sent to our referral coordinator regarding the referral that was submitted to Dr. Cyndy Freeze.  In any event the patient does not have a compressive lesions seen on MRI.  She may benefit from physical therapy  4. Need for dental care Referral submitted to dentist  5. Essential hypertension Controlled on current medications.  Patient was given the opportunity to ask questions.  Patient verbalized understanding of the plan and was able to repeat key elements of the plan.   Orders Placed This Encounter  Procedures  . POCT glucose (manual entry)  . POCT glycosylated hemoglobin (Hb A1C)     Requested Prescriptions    No prescriptions requested or ordered in this encounter    No follow-ups on file.  Karle Plumber, MD, FACP

## 2018-03-16 NOTE — Progress Notes (Signed)
Pt states she has been having some pain in her b/l hips

## 2018-03-31 ENCOUNTER — Other Ambulatory Visit: Payer: Self-pay

## 2018-03-31 DIAGNOSIS — I10 Essential (primary) hypertension: Secondary | ICD-10-CM

## 2018-03-31 MED ORDER — HYDROCHLOROTHIAZIDE 25 MG PO TABS: 25.0000 mg | ORAL_TABLET | Freq: Every day | ORAL | 5 refills | Status: DC

## 2018-04-02 ENCOUNTER — Other Ambulatory Visit: Payer: Self-pay

## 2018-04-02 DIAGNOSIS — I10 Essential (primary) hypertension: Secondary | ICD-10-CM

## 2018-04-02 MED ORDER — LISINOPRIL 40 MG PO TABS: 40.0000 mg | ORAL_TABLET | Freq: Every day | ORAL | 6 refills | Status: DC

## 2018-04-08 ENCOUNTER — Other Ambulatory Visit: Payer: Self-pay

## 2018-04-08 DIAGNOSIS — Z6841 Body Mass Index (BMI) 40.0 and over, adult: Secondary | ICD-10-CM | POA: Diagnosis not present

## 2018-04-08 DIAGNOSIS — I10 Essential (primary) hypertension: Secondary | ICD-10-CM

## 2018-04-08 DIAGNOSIS — M4722 Other spondylosis with radiculopathy, cervical region: Secondary | ICD-10-CM | POA: Diagnosis not present

## 2018-04-08 DIAGNOSIS — M542 Cervicalgia: Secondary | ICD-10-CM | POA: Diagnosis not present

## 2018-04-08 MED ORDER — AMLODIPINE BESYLATE 10 MG PO TABS
10.0000 mg | ORAL_TABLET | Freq: Every day | ORAL | 6 refills | Status: DC
Start: 2018-04-08 — End: 2018-10-05

## 2018-04-10 DIAGNOSIS — M545 Low back pain: Secondary | ICD-10-CM | POA: Diagnosis not present

## 2018-04-10 DIAGNOSIS — M47816 Spondylosis without myelopathy or radiculopathy, lumbar region: Secondary | ICD-10-CM | POA: Diagnosis not present

## 2018-04-10 DIAGNOSIS — M4316 Spondylolisthesis, lumbar region: Secondary | ICD-10-CM | POA: Diagnosis not present

## 2018-04-27 ENCOUNTER — Ambulatory Visit (HOSPITAL_COMMUNITY): Payer: Self-pay | Admitting: Psychiatry

## 2018-05-13 ENCOUNTER — Ambulatory Visit (INDEPENDENT_AMBULATORY_CARE_PROVIDER_SITE_OTHER): Payer: Medicare Other | Admitting: Psychology

## 2018-05-13 DIAGNOSIS — F3289 Other specified depressive episodes: Secondary | ICD-10-CM | POA: Diagnosis not present

## 2018-06-03 ENCOUNTER — Ambulatory Visit (INDEPENDENT_AMBULATORY_CARE_PROVIDER_SITE_OTHER): Payer: Medicare Other | Admitting: Psychology

## 2018-06-03 DIAGNOSIS — F3289 Other specified depressive episodes: Secondary | ICD-10-CM

## 2018-06-05 ENCOUNTER — Other Ambulatory Visit (HOSPITAL_COMMUNITY): Payer: Self-pay

## 2018-06-05 DIAGNOSIS — F331 Major depressive disorder, recurrent, moderate: Secondary | ICD-10-CM

## 2018-06-05 MED ORDER — DULOXETINE HCL 30 MG PO CPEP: 90.0000 mg | ORAL_CAPSULE | Freq: Every day | ORAL | 0 refills | Status: DC

## 2018-06-05 MED ORDER — LURASIDONE HCL 40 MG PO TABS: 40.0000 mg | ORAL_TABLET | Freq: Every day | ORAL | 0 refills | Status: DC

## 2018-06-08 ENCOUNTER — Ambulatory Visit (HOSPITAL_COMMUNITY): Payer: Self-pay | Admitting: Psychiatry

## 2018-06-11 DIAGNOSIS — M4722 Other spondylosis with radiculopathy, cervical region: Secondary | ICD-10-CM | POA: Diagnosis not present

## 2018-06-17 ENCOUNTER — Ambulatory Visit (INDEPENDENT_AMBULATORY_CARE_PROVIDER_SITE_OTHER): Payer: Medicare Other | Admitting: Psychology

## 2018-06-17 DIAGNOSIS — F3289 Other specified depressive episodes: Secondary | ICD-10-CM

## 2018-06-19 ENCOUNTER — Encounter: Payer: Self-pay | Admitting: Internal Medicine

## 2018-06-19 ENCOUNTER — Ambulatory Visit: Payer: Medicare Other | Attending: Internal Medicine | Admitting: Internal Medicine

## 2018-06-19 VITALS — BP 109/76 | HR 83 | Temp 98.2°F | Resp 16 | Wt 255.2 lb

## 2018-06-19 DIAGNOSIS — M79672 Pain in left foot: Secondary | ICD-10-CM | POA: Insufficient documentation

## 2018-06-19 DIAGNOSIS — M7989 Other specified soft tissue disorders: Secondary | ICD-10-CM | POA: Diagnosis not present

## 2018-06-19 DIAGNOSIS — F329 Major depressive disorder, single episode, unspecified: Secondary | ICD-10-CM | POA: Insufficient documentation

## 2018-06-19 DIAGNOSIS — M7062 Trochanteric bursitis, left hip: Secondary | ICD-10-CM | POA: Insufficient documentation

## 2018-06-19 DIAGNOSIS — G8929 Other chronic pain: Secondary | ICD-10-CM | POA: Diagnosis not present

## 2018-06-19 DIAGNOSIS — E119 Type 2 diabetes mellitus without complications: Secondary | ICD-10-CM

## 2018-06-19 DIAGNOSIS — M5412 Radiculopathy, cervical region: Secondary | ICD-10-CM | POA: Insufficient documentation

## 2018-06-19 DIAGNOSIS — M5010 Cervical disc disorder with radiculopathy, unspecified cervical region: Secondary | ICD-10-CM

## 2018-06-19 DIAGNOSIS — M5030 Other cervical disc degeneration, unspecified cervical region: Secondary | ICD-10-CM | POA: Insufficient documentation

## 2018-06-19 DIAGNOSIS — Z23 Encounter for immunization: Secondary | ICD-10-CM | POA: Diagnosis not present

## 2018-06-19 DIAGNOSIS — Z7984 Long term (current) use of oral hypoglycemic drugs: Secondary | ICD-10-CM | POA: Diagnosis not present

## 2018-06-19 DIAGNOSIS — J329 Chronic sinusitis, unspecified: Secondary | ICD-10-CM | POA: Insufficient documentation

## 2018-06-19 DIAGNOSIS — Z6841 Body Mass Index (BMI) 40.0 and over, adult: Secondary | ICD-10-CM | POA: Insufficient documentation

## 2018-06-19 DIAGNOSIS — M79671 Pain in right foot: Secondary | ICD-10-CM | POA: Diagnosis not present

## 2018-06-19 DIAGNOSIS — Z79899 Other long term (current) drug therapy: Secondary | ICD-10-CM | POA: Diagnosis not present

## 2018-06-19 DIAGNOSIS — I10 Essential (primary) hypertension: Secondary | ICD-10-CM | POA: Diagnosis not present

## 2018-06-19 DIAGNOSIS — R600 Localized edema: Secondary | ICD-10-CM

## 2018-06-19 LAB — POCT GLYCOSYLATED HEMOGLOBIN (HGB A1C): HbA1c, POC (prediabetic range): 6 % (ref 5.7–6.4)

## 2018-06-19 LAB — GLUCOSE, POCT (MANUAL RESULT ENTRY): POC GLUCOSE: 230 mg/dL — AB (ref 70–99)

## 2018-06-19 MED ORDER — PREGABALIN 50 MG PO CAPS: 50.0000 mg | ORAL_CAPSULE | Freq: Three times a day (TID) | ORAL | 3 refills | Status: DC

## 2018-06-19 NOTE — Progress Notes (Signed)
Pt states her neck hurts

## 2018-06-19 NOTE — Progress Notes (Signed)
Patient ID: Michele Wood, female    DOB: [birth year]  MRN: [id number]  CC: Diabetes and Hypertension   Subjective: Michele Wood is a 57 y.o. female who presents for chronic ds management.  Husband is with her. Her concerns today include:  Hx of HTN, chronic sinusitis, degen disc of c-spine, chronic LBP, vit D def, obesity, anx/dep, new DM, dep with psychosis  C/o swelling and aching in feet when feet are kept  in dependent position X 3-4 yrs.  Feels like "it is aching to the bone, like I can feel my heart beat in my foot."  Pain mainly on the dorsal surface.  Alternates sides. Prevents her from swimming "because I have to walk to get there." Taking Ibuprofen  DM: checks BS occasionally.  Reports good readings. Eating less and "I watch what I eat more.  I try to stay away from red meat." Started drinking more water. Drinks 2 cups of regular sodas a day. Still swims.  Use to go 3 x a wk but cut back to 2 x a wk "since I get the foot issue."  Loss 12 lbs since 12/2017  HTN: compliant with amlodipine and lisinopril.  She tries to monitor her salt intake.  No chest pains or shortness of breath.  Cervical radiculopathy: We did prescribe Lyrica in the past and she did well with this.  She was getting it through the patient assistance program but now that she has insurance she no longer qualifies for through the patient assistance program.  She has been taking gabapentin that she had prior to Lyrica.  However her insurance would not pay for the gabapentin and she felt her symptoms were better with the Lyrica.  She is requesting prescription for the Lyrica   Patient Active Problem List   Diagnosis Date Noted  . New onset type 2 diabetes mellitus ([place]) 12/12/2017  . Class 3 severe obesity with serious comorbidity and body mass index (BMI) of 45.0 to 49.9 in adult ([place]) 09/12/2017  . Anxiety and depression 06/13/2017  . Cervical radiculopathy due to intervertebral disc  disorder 06/13/2017  . Trochanteric bursitis of both hips 05/13/2017  . Strain of right supraspinatus muscle 05/01/2017  . Chronic low back pain without sciatica 09/18/2015  . Degenerative cervical spinal stenosis 06/08/2015  . Vitamin D insufficiency 04/25/2014  . Sinusitis, chronic 08/23/2013  . HTN (hypertension) 06/21/2008     Current Outpatient Medications on File Prior to Visit  Medication Sig Dispense Refill  . amLODipine (NORVASC) 10 MG tablet Take 1 tablet (10 mg total) by mouth daily. 30 tablet 6  . Cholecalciferol (VITAMIN D3) 2000 UNITS TABS Take 2,000 Units by mouth daily. 30 tablet 11  . cyclobenzaprine (FLEXERIL) 10 MG tablet TAKE 1 TABLET BY MOUTH 3 TIMES DAILY AS NEEDED FOR MUSCLE SPASMS 60 tablet 0  . DULoxetine (CYMBALTA) 30 MG capsule Take 3 capsules (90 mg total) by mouth daily. 90 capsule 0  . hydrochlorothiazide (HYDRODIURIL) 25 MG tablet Take 1 tablet (25 mg total) by mouth daily. 30 tablet 5  . lisinopril (PRINIVIL,ZESTRIL) 40 MG tablet Take 1 tablet (40 mg total) by mouth daily. 30 tablet 6  . loratadine (CLARITIN) 10 MG tablet Take 1 tablet (10 mg total) by mouth daily. 30 tablet 11  . lurasidone (LATUDA) 40 MG TABS tablet Take 1 tablet (40 mg total) by mouth daily with breakfast. 30 tablet 0  . meloxicam (MOBIC) 15 MG tablet TAKE 1 TABLET BY MOUTH DAILY. 30 tablet  0  . metFORMIN (GLUCOPHAGE) 500 MG tablet Take 1 tablet (500 mg total) by mouth daily with breakfast. 90 tablet 3  . montelukast (SINGULAIR) 10 MG tablet Take 1 tablet (10 mg total) by mouth at bedtime. 30 tablet 3  . omeprazole (PRILOSEC) 20 MG capsule Take 1 capsule (20 mg total) by mouth 2 (two) times daily before a meal. 30 capsule 0  . triamcinolone (NASACORT AQ) 55 MCG/ACT AERO nasal inhaler Place 2 sprays into the nose daily. 1 Inhaler 12   No current facility-administered medications on file prior to visit.     Allergies  Allergen Reactions  . Milk-Related Compounds Diarrhea and Other (See  Comments)    Diarrhea and stomach upset  . Codeine Sulfate Rash    Social History   Socioeconomic History  . Marital status: Married    Spouse name: Jenny Reichmann  . Number of children: 2  . Years of education: Not on file  . Highest education level: Some college, no degree  Occupational History  . Not on file  Social Needs  . Financial resource strain: Very hard  . Food insecurity:    Worry: Often true    Inability: Often true  . Transportation needs:    Medical: No    Non-medical: No  Tobacco Use  . Smoking status: Never Smoker  . Smokeless tobacco: Never Used  Substance and Sexual Activity  . Alcohol use: Yes    Alcohol/week: 0.0 standard drinks    Comment: rare  . Drug use: No  . Sexual activity: Not Currently  Lifestyle  . Physical activity:    Days per week: 7 days    Minutes per session: 60 min  . Stress: Very much  Relationships  . Social connections:    Talks on phone: Three times a week    Gets together: Not on file    Attends religious service: Not on file    Active member of club or organization: Not on file    Attends meetings of clubs or organizations: Not on file    Relationship status: Not on file  . Intimate partner violence:    Fear of current or ex partner: No    Emotionally abused: No    Physically abused: No    Forced sexual activity: No  Other Topics Concern  . Not on file  Social History Narrative  . Not on file    Family History  Problem Relation Age of Onset  . Hypertension Mother   . Hypertension Other   . Schizophrenia Brother     Past Surgical History:  Procedure Laterality Date  . ABDOMINAL HYSTERECTOMY    . BREAST BIOPSY Right 07/02/2006   [place] Core benign  . NASAL SINUS SURGERY      ROS: Review of Systems Negative except as above PHYSICAL EXAM: BP 109/76   Pulse 83   Temp 98.2 F (36.8 C) (Oral)   Resp 16   Wt 255 lb 3.2 oz (115.8 kg)   LMP 09/01/2013   SpO2 95%   BMI 46.68 kg/m   Wt Readings from Last 3  Encounters:  06/19/18 255 lb 3.2 oz (115.8 kg)  03/16/18 259 lb 3.2 oz (117.6 kg)  12/12/17 267 lb 6.4 oz (121.3 kg)    Physical Exam  General appearance - alert, well appearing, and in no distress Mental status - normal mood, behavior, speech, dress, motor activity, and thought processes Neck - supple, no significant adenopathy Chest - clear to auscultation, no wheezes, rales  or rhonchi, symmetric air entry Heart - normal rate, regular rhythm, normal S1, S2, no murmurs, rubs, clicks or gallops Musculoskeletal -feet: Slight edema on the dorsal surface of the right foot.  Good range of motion at the ankle joints. Extremities -no edema in the lower legs.  Dorsalis pedis and posterior tibialis pulses are 3+ bilaterally. Diabetic Foot Exam - Simple   Simple Foot Form Visual Inspection See comments:  Yes Sensation Testing Intact to touch and monofilament testing bilaterally:  Yes Pulse Check Posterior Tibialis and Dorsalis pulse intact bilaterally:  Yes Comments Small callus on the medial aspect of the sole of the right big toe    Lab Results  Component Value Date   HGBA1C 6.0 06/19/2018    ASSESSMENT AND PLAN: 1. Controlled type 2 diabetes mellitus without complication, without long-term current use of insulin ([place]) Commended her on control of her diabetes and weight loss.  Continue to encourage healthy eating habits and regular exercise. - POCT glucose (manual entry) - POCT glycosylated hemoglobin (Hb A1C)  2. Need for immunization against influenza - Flu Vaccine QUAD 36+ mos IM  3. Essential hypertension At goal.  Continue Norvasc and lisinopril  4. Class 3 severe obesity due to excess calories with serious comorbidity and body mass index (BMI) of 45.0 to 49.9 in adult [facility]) See #1 above  5. Pedal edema Recommend purchasing a pair of compression socks and using them when she is up on her feet during the day  6. Cervical radiculopathy due to intervertebral disc  disorder Prescription given for Lyrica  7. Foot pain, bilateral Could be a component of diabetic neuropathy for which gabapentin or Lyrica should help.  Patient was given the opportunity to ask questions.  Patient verbalized understanding of the plan and was able to repeat key elements of the plan.   Orders Placed This Encounter  Procedures  . Flu Vaccine QUAD 36+ mos IM  . POCT glucose (manual entry)  . POCT glycosylated hemoglobin (Hb A1C)     Requested Prescriptions   Signed Prescriptions Disp Refills  . pregabalin (LYRICA) 50 MG capsule 90 capsule 3    Sig: Take 1 capsule (50 mg total) by mouth 3 (three) times daily.    Return in about 4 months (around 10/19/2018).  Karle Plumber, MD, FACP

## 2018-06-19 NOTE — Patient Instructions (Addendum)
Try wearing compression socks during the day.  Try to limit salt in the foods.    Influenza Virus Vaccine injection (Fluarix) What is this medicine? INFLUENZA VIRUS VACCINE (in floo EN zuh VAHY ruhs vak SEEN) helps to reduce the risk of getting influenza also known as the flu. This medicine may be used for other purposes; ask your health care provider or pharmacist if you have questions. COMMON BRAND NAME(S): Fluarix, Fluzone What should I tell my health care provider before I take this medicine? They need to know if you have any of these conditions: -bleeding disorder like hemophilia -fever or infection -Guillain-Barre syndrome or other neurological problems -immune system problems -infection with the human immunodeficiency virus (HIV) or AIDS -low blood platelet counts -multiple sclerosis -an unusual or allergic reaction to influenza virus vaccine, eggs, chicken proteins, latex, gentamicin, other medicines, foods, dyes or preservatives -pregnant or trying to get pregnant -breast-feeding How should I use this medicine? This vaccine is for injection into a muscle. It is given by a health care professional. A copy of Vaccine Information Statements will be given before each vaccination. Read this sheet carefully each time. The sheet may change frequently. Talk to your pediatrician regarding the use of this medicine in children. Special care may be needed. Overdosage: If you think you have taken too much of this medicine contact a poison control center or emergency room at once. NOTE: This medicine is only for you. Do not share this medicine with others. What if I miss a dose? This does not apply. What may interact with this medicine? -chemotherapy or radiation therapy -medicines that lower your immune system like etanercept, anakinra, infliximab, and adalimumab -medicines that treat or prevent blood clots like warfarin -phenytoin -steroid medicines like prednisone or  cortisone -theophylline -vaccines This list may not describe all possible interactions. Give your health care provider a list of all the medicines, herbs, non-prescription drugs, or dietary supplements you use. Also tell them if you smoke, drink alcohol, or use illegal drugs. Some items may interact with your medicine. What should I watch for while using this medicine? Report any side effects that do not go away within 3 days to your doctor or health care professional. Call your health care provider if any unusual symptoms occur within 6 weeks of receiving this vaccine. You may still catch the flu, but the illness is not usually as bad. You cannot get the flu from the vaccine. The vaccine will not protect against colds or other illnesses that may cause fever. The vaccine is needed every year. What side effects may I notice from receiving this medicine? Side effects that you should report to your doctor or health care professional as soon as possible: -allergic reactions like skin rash, itching or hives, swelling of the face, lips, or tongue Side effects that usually do not require medical attention (report to your doctor or health care professional if they continue or are bothersome): -fever -headache -muscle aches and pains -pain, tenderness, redness, or swelling at site where injected -weak or tired This list may not describe all possible side effects. Call your doctor for medical advice about side effects. You may report side effects to FDA at 1-800-FDA-1088. Where should I keep my medicine? This vaccine is only given in a clinic, pharmacy, doctor's office, or other health care setting and will not be stored at home. NOTE: This sheet is a summary. It may not cover all possible information. If you have questions about this medicine, talk to  your doctor, pharmacist, or health care provider.  2018 Elsevier/Gold Standard (2008-04-27 09:30:40)

## 2018-06-26 ENCOUNTER — Ambulatory Visit (INDEPENDENT_AMBULATORY_CARE_PROVIDER_SITE_OTHER): Payer: Medicare Other | Admitting: Psychology

## 2018-06-26 DIAGNOSIS — F3289 Other specified depressive episodes: Secondary | ICD-10-CM

## 2018-07-01 ENCOUNTER — Ambulatory Visit: Payer: Medicare Other | Admitting: Psychology

## 2018-07-03 ENCOUNTER — Ambulatory Visit (INDEPENDENT_AMBULATORY_CARE_PROVIDER_SITE_OTHER): Payer: Medicare Other | Admitting: Psychology

## 2018-07-03 DIAGNOSIS — F3289 Other specified depressive episodes: Secondary | ICD-10-CM

## 2018-07-07 ENCOUNTER — Other Ambulatory Visit (HOSPITAL_COMMUNITY): Payer: Self-pay

## 2018-07-07 DIAGNOSIS — F331 Major depressive disorder, recurrent, moderate: Secondary | ICD-10-CM

## 2018-07-07 MED ORDER — DULOXETINE HCL 30 MG PO CPEP: 90.0000 mg | ORAL_CAPSULE | Freq: Every day | ORAL | 0 refills | Status: DC

## 2018-07-14 DIAGNOSIS — I10 Essential (primary) hypertension: Secondary | ICD-10-CM | POA: Diagnosis not present

## 2018-07-14 DIAGNOSIS — M4316 Spondylolisthesis, lumbar region: Secondary | ICD-10-CM | POA: Diagnosis not present

## 2018-07-14 DIAGNOSIS — Z6841 Body Mass Index (BMI) 40.0 and over, adult: Secondary | ICD-10-CM | POA: Diagnosis not present

## 2018-07-15 ENCOUNTER — Ambulatory Visit: Payer: Medicare Other | Admitting: Psychology

## 2018-07-22 ENCOUNTER — Ambulatory Visit: Payer: Medicare Other | Admitting: Psychology

## 2018-07-29 ENCOUNTER — Ambulatory Visit (INDEPENDENT_AMBULATORY_CARE_PROVIDER_SITE_OTHER): Payer: Medicare Other | Admitting: Psychology

## 2018-07-29 DIAGNOSIS — F3289 Other specified depressive episodes: Secondary | ICD-10-CM | POA: Diagnosis not present

## 2018-08-06 ENCOUNTER — Encounter (HOSPITAL_COMMUNITY): Payer: Self-pay | Admitting: Psychiatry

## 2018-08-06 ENCOUNTER — Ambulatory Visit (INDEPENDENT_AMBULATORY_CARE_PROVIDER_SITE_OTHER): Payer: Medicare Other | Admitting: Psychiatry

## 2018-08-06 VITALS — BP 118/77 | HR 92 | Ht 62.0 in | Wt 255.0 lb

## 2018-08-06 DIAGNOSIS — F4310 Post-traumatic stress disorder, unspecified: Secondary | ICD-10-CM

## 2018-08-06 DIAGNOSIS — F331 Major depressive disorder, recurrent, moderate: Secondary | ICD-10-CM | POA: Diagnosis not present

## 2018-08-06 MED ORDER — LURASIDONE HCL 40 MG PO TABS: 40.0000 mg | ORAL_TABLET | Freq: Every day | ORAL | 0 refills | Status: DC

## 2018-08-06 MED ORDER — DULOXETINE HCL 30 MG PO CPEP: 90.0000 mg | ORAL_CAPSULE | Freq: Every day | ORAL | 0 refills | Status: DC

## 2018-08-06 MED ORDER — DULOXETINE HCL 30 MG PO CPEP: 90.0000 mg | ORAL_CAPSULE | Freq: Every day | ORAL | 2 refills | Status: DC
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
## 2018-08-07 ENCOUNTER — Ambulatory Visit (INDEPENDENT_AMBULATORY_CARE_PROVIDER_SITE_OTHER): Payer: Medicare Other | Admitting: Psychology

## 2018-08-07 DIAGNOSIS — F3289 Other specified depressive episodes: Secondary | ICD-10-CM

## 2018-08-12 ENCOUNTER — Ambulatory Visit (INDEPENDENT_AMBULATORY_CARE_PROVIDER_SITE_OTHER): Payer: Medicare Other | Admitting: Psychology

## 2018-08-12 DIAGNOSIS — F3289 Other specified depressive episodes: Secondary | ICD-10-CM

## 2018-08-28 ENCOUNTER — Ambulatory Visit (INDEPENDENT_AMBULATORY_CARE_PROVIDER_SITE_OTHER): Payer: Medicare Other | Admitting: Psychology

## 2018-08-28 DIAGNOSIS — F3289 Other specified depressive episodes: Secondary | ICD-10-CM

## 2018-09-16 ENCOUNTER — Ambulatory Visit (INDEPENDENT_AMBULATORY_CARE_PROVIDER_SITE_OTHER): Payer: Medicare Other | Admitting: Psychology

## 2018-09-16 DIAGNOSIS — F3289 Other specified depressive episodes: Secondary | ICD-10-CM

## 2018-09-29 ENCOUNTER — Other Ambulatory Visit: Payer: Self-pay | Admitting: Internal Medicine

## 2018-09-29 DIAGNOSIS — I10 Essential (primary) hypertension: Secondary | ICD-10-CM

## 2018-09-30 ENCOUNTER — Ambulatory Visit (INDEPENDENT_AMBULATORY_CARE_PROVIDER_SITE_OTHER): Payer: Medicare Other | Admitting: Psychology

## 2018-09-30 DIAGNOSIS — F3289 Other specified depressive episodes: Secondary | ICD-10-CM | POA: Diagnosis not present

## 2018-10-03 ENCOUNTER — Other Ambulatory Visit (HOSPITAL_COMMUNITY): Payer: Self-pay | Admitting: Psychiatry

## 2018-10-03 DIAGNOSIS — F331 Major depressive disorder, recurrent, moderate: Secondary | ICD-10-CM

## 2018-10-03 DIAGNOSIS — F4310 Post-traumatic stress disorder, unspecified: Secondary | ICD-10-CM

## 2018-10-05 ENCOUNTER — Other Ambulatory Visit: Payer: Self-pay | Admitting: Internal Medicine

## 2018-10-05 DIAGNOSIS — I10 Essential (primary) hypertension: Secondary | ICD-10-CM

## 2018-10-14 HISTORY — PX: EXPLORATION POST OPERATIVE OPEN HEART: SHX5061

## 2018-10-14 HISTORY — PX: BACK SURGERY: SHX140

## 2018-10-16 ENCOUNTER — Ambulatory Visit (INDEPENDENT_AMBULATORY_CARE_PROVIDER_SITE_OTHER): Payer: Medicare Other | Admitting: Psychology

## 2018-10-16 DIAGNOSIS — F3289 Other specified depressive episodes: Secondary | ICD-10-CM

## 2018-10-19 ENCOUNTER — Ambulatory Visit (HOSPITAL_BASED_OUTPATIENT_CLINIC_OR_DEPARTMENT_OTHER): Payer: Medicare Other | Admitting: Internal Medicine

## 2018-10-19 ENCOUNTER — Ambulatory Visit (HOSPITAL_COMMUNITY)
Admission: RE | Admit: 2018-10-19 | Discharge: 2018-10-19 | Disposition: A | Discharge status: Home / Self Care | Payer: Medicare Other | Source: Ambulatory Visit | Attending: Family Medicine | Admitting: Family Medicine

## 2018-10-19 ENCOUNTER — Encounter: Payer: Self-pay | Admitting: Internal Medicine

## 2018-10-19 ENCOUNTER — Other Ambulatory Visit: Payer: Self-pay

## 2018-10-19 VITALS — BP 127/84 | HR 82 | Temp 97.7°F | Resp 16 | Ht 62.0 in | Wt 255.6 lb

## 2018-10-19 DIAGNOSIS — M545 Low back pain: Secondary | ICD-10-CM

## 2018-10-19 DIAGNOSIS — F419 Anxiety disorder, unspecified: Secondary | ICD-10-CM

## 2018-10-19 DIAGNOSIS — R0789 Other chest pain: Secondary | ICD-10-CM | POA: Diagnosis not present

## 2018-10-19 DIAGNOSIS — E559 Vitamin D deficiency, unspecified: Secondary | ICD-10-CM

## 2018-10-19 DIAGNOSIS — Z79899 Other long term (current) drug therapy: Secondary | ICD-10-CM

## 2018-10-19 DIAGNOSIS — Z6841 Body Mass Index (BMI) 40.0 and over, adult: Secondary | ICD-10-CM | POA: Insufficient documentation

## 2018-10-19 DIAGNOSIS — R079 Chest pain, unspecified: Secondary | ICD-10-CM | POA: Insufficient documentation

## 2018-10-19 DIAGNOSIS — I10 Essential (primary) hypertension: Secondary | ICD-10-CM

## 2018-10-19 DIAGNOSIS — I447 Left bundle-branch block, unspecified: Secondary | ICD-10-CM

## 2018-10-19 DIAGNOSIS — E66813 Obesity, class 3: Secondary | ICD-10-CM

## 2018-10-19 DIAGNOSIS — R600 Localized edema: Secondary | ICD-10-CM | POA: Diagnosis not present

## 2018-10-19 DIAGNOSIS — M7989 Other specified soft tissue disorders: Secondary | ICD-10-CM

## 2018-10-19 DIAGNOSIS — L84 Corns and callosities: Secondary | ICD-10-CM

## 2018-10-19 DIAGNOSIS — Z8249 Family history of ischemic heart disease and other diseases of the circulatory system: Secondary | ICD-10-CM

## 2018-10-19 DIAGNOSIS — Z885 Allergy status to narcotic agent status: Secondary | ICD-10-CM

## 2018-10-19 DIAGNOSIS — F331 Major depressive disorder, recurrent, moderate: Secondary | ICD-10-CM | POA: Insufficient documentation

## 2018-10-19 DIAGNOSIS — Z7984 Long term (current) use of oral hypoglycemic drugs: Secondary | ICD-10-CM

## 2018-10-19 DIAGNOSIS — E1140 Type 2 diabetes mellitus with diabetic neuropathy, unspecified: Secondary | ICD-10-CM

## 2018-10-19 DIAGNOSIS — Z23 Encounter for immunization: Secondary | ICD-10-CM

## 2018-10-19 DIAGNOSIS — Z7982 Long term (current) use of aspirin: Secondary | ICD-10-CM

## 2018-10-19 DIAGNOSIS — E119 Type 2 diabetes mellitus without complications: Secondary | ICD-10-CM

## 2018-10-19 DIAGNOSIS — Z791 Long term (current) use of non-steroidal anti-inflammatories (NSAID): Secondary | ICD-10-CM | POA: Insufficient documentation

## 2018-10-19 DIAGNOSIS — G8929 Other chronic pain: Secondary | ICD-10-CM | POA: Insufficient documentation

## 2018-10-19 LAB — GLUCOSE, POCT (MANUAL RESULT ENTRY): POC Glucose: 113 mg/dl — AB (ref 70–99)

## 2018-10-19 LAB — POCT GLYCOSYLATED HEMOGLOBIN (HGB A1C): HbA1c, POC (prediabetic range): 5.7 % (ref 5.7–6.4)

## 2018-10-19 MED ORDER — LISINOPRIL 40 MG PO TABS: 40.0000 mg | ORAL_TABLET | Freq: Every day | ORAL | 3 refills | Status: DC

## 2018-10-19 MED ORDER — HYDROCHLOROTHIAZIDE 25 MG PO TABS: 25.0000 mg | ORAL_TABLET | Freq: Every day | ORAL | 3 refills | Status: DC

## 2018-10-19 MED ORDER — ASPIRIN EC 81 MG PO TBEC: 81.0000 mg | DELAYED_RELEASE_TABLET | Freq: Every day | ORAL | 1 refills | Status: DC

## 2018-10-19 MED ORDER — METFORMIN HCL 500 MG PO TABS: 500.0000 mg | ORAL_TABLET | Freq: Every day | ORAL | 3 refills | Status: DC

## 2018-10-19 MED ORDER — AMLODIPINE BESYLATE 10 MG PO TABS: 10.0000 mg | ORAL_TABLET | Freq: Every day | ORAL | 3 refills | Status: DC

## 2018-10-19 NOTE — Patient Instructions (Addendum)
Try to check your blood pressure at least once a week at a nearby pharmacy.  The goal is 130/80 or lower.  Continue to limit salt in the foods.  You have been referred to podiatry, ophthalmology and cardiology. Start taking a baby Aspirin 81 mg daily.  Try stopping the Lyrica for 1 to 2 weeks to see whether the swelling in the feet resolves.  This will help [place] in determining whether the swelling is due to the Lyrica.  Pneumococcal Polysaccharide Vaccine: What You Need to Know 1. Why get vaccinated? Vaccination can protect older adults (and some children and younger adults) from pneumococcal disease. Pneumococcal disease is caused by bacteria that can spread from person to person through close contact. It can cause ear infections, and it can also lead to more serious infections of the:  Lungs (pneumonia),  Blood (bacteremia), and  Covering of the brain and spinal cord (meningitis). Meningitis can cause deafness and brain damage, and it can be fatal. Anyone can get pneumococcal disease, but children under 42 years of age, people with certain medical conditions, adults over 85 years of age, and cigarette smokers are at the highest risk. About 18,000 older adults die each year from pneumococcal disease in the [place]. Treatment of pneumococcal infections with penicillin and other drugs used to be more effective. But some strains of the disease have become resistant to these drugs. This makes prevention of the disease, through vaccination, even more important. 2. Pneumococcal polysaccharide vaccine (PPSV23) Pneumococcal polysaccharide vaccine (PPSV23) protects against 23 types of pneumococcal bacteria. It will not prevent all pneumococcal disease. PPSV23 is recommended for:  All adults 84 years of age and older,  Anyone 2 through 57 years of age with certain long-term health problems,  Anyone 2 through 57 years of age with a weakened immune system,  Adults 81 through 57 years of age who  smoke cigarettes or have asthma. Most people need only one dose of PPSV. A second dose is recommended for certain high-risk groups. People 20 and older should get a dose even if they have gotten one or more doses of the vaccine before they turned 65. Your healthcare provider can give you more information about these recommendations. Most healthy adults develop protection within 2 to 3 weeks of getting the shot. 3. Some people should not get this vaccine  Anyone who has had a life-threatening allergic reaction to PPSV should not get another dose.  Anyone who has a severe allergy to any component of PPSV should not receive it. Tell your provider if you have any severe allergies.  Anyone who is moderately or severely ill when the shot is scheduled may be asked to wait until they recover before getting the vaccine. Someone with a mild illness can usually be vaccinated.  Children less than 48 years of age should not receive this vaccine.  There is no evidence that PPSV is harmful to either a pregnant woman or to her fetus. However, as a precaution, women who need the vaccine should be vaccinated before becoming pregnant, if possible. 4. Risks of a vaccine reaction With any medicine, including vaccines, there is a chance of side effects. These are usually mild and go away on their own, but serious reactions are also possible. About half of people who get PPSV have mild side effects, such as redness or pain where the shot is given, which go away within about two days. Less than 1 out of 100 people develop a fever, muscle aches, or  more severe local reactions. Problems that could happen after any vaccine:  People sometimes faint after a medical procedure, including vaccination. Sitting or lying down for about 15 minutes can help prevent fainting, and injuries caused by a fall. Tell your doctor if you feel dizzy, or have vision changes or ringing in the ears.  Some people get severe pain in the shoulder  and have difficulty moving the arm where a shot was given. This happens very rarely.  Any medication can cause a severe allergic reaction. Such reactions from a vaccine are very rare, estimated at about 1 in a million doses, and would happen within a few minutes to a few hours after the vaccination. As with any medicine, there is a very remote chance of a vaccine causing a serious injury or death. The safety of vaccines is always being monitored. For more information, visit: http://www.aguilar.org/ 5. What if there is a serious reaction? What should I look for? Look for anything that concerns you, such as signs of a severe allergic reaction, very high fever, or unusual behavior. Signs of a severe allergic reaction can include hives, swelling of the face and throat, difficulty breathing, a fast heartbeat, dizziness, and weakness. These would usually start a few minutes to a few hours after the vaccination. What should I do? If you think it is a severe allergic reaction or other emergency that can't wait, call 9-1-1 or get to the nearest hospital. Otherwise, call your doctor. Afterward, the reaction should be reported to the Vaccine Adverse Event Reporting System (VAERS). Your doctor might file this report, or you can do it yourself through the VAERS web site at www.vaers.SamedayNews.es, or by calling 507-089-8872. VAERS does not give medical advice. 6. How can I learn more?  Ask your doctor. He or she can give you the vaccine package insert or suggest other sources of information.  Call your local or state health department.  Contact the Centers for Disease Control and Prevention (CDC): ? Call (941)098-3219 (1-800-CDC-INFO) or ? Visit CDC's website at http://hunter.com/ CDC Vaccine Information Statement PPSV Vaccine (02/04/2014) This information is not intended to replace advice given to you by your health care provider. Make sure you discuss any questions you have with your health care  provider. Document Released: 07/28/2006 Document Revised: 05/12/2018 Document Reviewed: 05/12/2018 Elsevier Interactive Patient Education  2019 [employer].

## 2018-10-19 NOTE — Progress Notes (Signed)
Patient ID: Michele Wood, female    DOB: 1961/05/29  MRN: [id number]  CC: Diabetes and Hypertension   Subjective: Michele Wood is a 58 y.o. female who presents for chronic ds management Her concerns today include:  Hx of HTN, chronic sinusitis, degen disc of c-spine, chronic LBP, vit D def, obesity, anx/dep,  DM (with ? neuropathy in feet from DM vs lower back), dep with psychosis  DM:  "I'm moving more."  Walking around a block every day.  She also gets outside and plays with grandchildren Checks BS 1 x a wk.  Gives range 90-130 "I made a new yrs resolution to cut out sodas."  Drinking more water.  Due for eye exam.  Little blurred vision  HTN:  No device to check blood pressure. Compliant with medications and salt restrictions Planes of + intermittent pressure-like CP in past 1 mth, usually with activity.  No radiation down the arm or up the neck.  Usually goes away with rest. Patient does not smoke.  No family history of heart disease.  Chronic LBP:  Told by neurosurgeon, Dr. Arnoldo Morale, that she needs surgery on her lower back.  She held off during the holidays.  Plans to pursue sometime this yr -feet swelling continues.  Did get compression socks which helped initially but would still get swelling so bad that the socks became uncomfortable.  She thinks the swelling in her feet is due to her back issue  Depression:  Doing good on Latuda and Cymbalta.  She sees psychiatrist Dr. Adele Schilder. -Also sees a counselor 2 x a mth with [facility]  Patient Active Problem List   Diagnosis Date Noted  . New onset type 2 diabetes mellitus ([place]) 12/12/2017  . Class 3 severe obesity with serious comorbidity and body mass index (BMI) of 45.0 to 49.9 in adult ([place]) 09/12/2017  . Anxiety and depression 06/13/2017  . Cervical radiculopathy due to intervertebral disc disorder 06/13/2017  . Trochanteric bursitis of both hips 05/13/2017  . Strain of right supraspinatus muscle  05/01/2017  . Chronic low back pain without sciatica 09/18/2015  . Degenerative cervical spinal stenosis 06/08/2015  . Vitamin D insufficiency 04/25/2014  . Sinusitis, chronic 08/23/2013  . HTN (hypertension) 06/21/2008     Current Outpatient Medications on File Prior to Visit  Medication Sig Dispense Refill  . amLODipine (NORVASC) 10 MG tablet TAKE 1 TABLET BY MOUTH EVERY DAY 90 tablet 0  . Cholecalciferol (VITAMIN D3) 2000 UNITS TABS Take 2,000 Units by mouth daily. 30 tablet 11  . cyclobenzaprine (FLEXERIL) 10 MG tablet TAKE 1 TABLET BY MOUTH 3 TIMES DAILY AS NEEDED FOR MUSCLE SPASMS 60 tablet 0  . DULoxetine (CYMBALTA) 30 MG capsule Take 3 capsules (90 mg total) by mouth daily. 90 capsule 2  . hydrochlorothiazide (HYDRODIURIL) 25 MG tablet TAKE 1 TABLET BY MOUTH EVERY DAY 30 tablet 0  . lisinopril (PRINIVIL,ZESTRIL) 40 MG tablet TAKE 1 TABLET BY MOUTH EVERY DAY 30 tablet 0  . loratadine (CLARITIN) 10 MG tablet Take 1 tablet (10 mg total) by mouth daily. 30 tablet 11  . lurasidone (LATUDA) 40 MG TABS tablet Take 1 tablet (40 mg total) by mouth daily with breakfast. 90 tablet 0  . meloxicam (MOBIC) 15 MG tablet TAKE 1 TABLET BY MOUTH DAILY. 30 tablet 0  . metFORMIN (GLUCOPHAGE) 500 MG tablet Take 1 tablet (500 mg total) by mouth daily with breakfast. 90 tablet 3  . montelukast (SINGULAIR) 10 MG tablet Take 1 tablet (  10 mg total) by mouth at bedtime. 30 tablet 3  . omeprazole (PRILOSEC) 20 MG capsule Take 1 capsule (20 mg total) by mouth 2 (two) times daily before a meal. 30 capsule 0  . pregabalin (LYRICA) 50 MG capsule Take 1 capsule (50 mg total) by mouth 3 (three) times daily. 90 capsule 3  . triamcinolone (NASACORT AQ) 55 MCG/ACT AERO nasal inhaler Place 2 sprays into the nose daily. 1 Inhaler 12   No current facility-administered medications on file prior to visit.     Allergies  Allergen Reactions  . Milk-Related Compounds Diarrhea and Other (See Comments)    Diarrhea and  stomach upset  . Codeine Sulfate Rash    Social History   Socioeconomic History  . Marital status: Married    Spouse name: Jenny Reichmann  . Number of children: 2  . Years of education: Not on file  . Highest education level: Some college, no degree  Occupational History  . Not on file  Social Needs  . Financial resource strain: Very hard  . Food insecurity:    Worry: Often true    Inability: Often true  . Transportation needs:    Medical: No    Non-medical: No  Tobacco Use  . Smoking status: Never Smoker  . Smokeless tobacco: Never Used  Substance and Sexual Activity  . Alcohol use: Yes    Alcohol/week: 0.0 standard drinks    Comment: rare  . Drug use: No  . Sexual activity: Not Currently  Lifestyle  . Physical activity:    Days per week: 7 days    Minutes per session: 60 min  . Stress: Very much  Relationships  . Social connections:    Talks on phone: Three times a week    Gets together: Not on file    Attends religious service: Not on file    Active member of club or organization: Not on file    Attends meetings of clubs or organizations: Not on file    Relationship status: Not on file  . Intimate partner violence:    Fear of current or ex partner: No    Emotionally abused: No    Physically abused: No    Forced sexual activity: No  Other Topics Concern  . Not on file  Social History Narrative  . Not on file    Family History  Problem Relation Age of Onset  . Hypertension Mother   . Hypertension Other   . Schizophrenia Brother     Past Surgical History:  Procedure Laterality Date  . ABDOMINAL HYSTERECTOMY    . BREAST BIOPSY Right 07/02/2006   [place] Core benign  . NASAL SINUS SURGERY      ROS: Review of Systems Negative except as above.  PHYSICAL EXAM: BP 127/84   Pulse 82   Temp 97.7 F (36.5 C) (Oral)   Resp 16   Ht 5\' 2"  (1.575 m)   Wt 255 lb 9.6 oz (115.9 kg)   LMP 09/01/2013   SpO2 95%   BMI 46.75 kg/m   Wt Readings from Last 3  Encounters:  10/19/18 255 lb 9.6 oz (115.9 kg)  06/19/18 255 lb 3.2 oz (115.8 kg)  03/16/18 259 lb 3.2 oz (117.6 kg)   Physical Exam  General appearance - alert, well appearing, obese middle-aged African-American female and in no distress Mental status - normal mood, behavior, speech, dress, motor activity, and thought processes Mouth - mucous membranes moist, pharynx normal without lesions Neck - supple, no  significant adenopathy Chest - clear to auscultation, no wheezes, rales or rhonchi, symmetric air entry Heart - normal rate, regular rhythm, normal S1, S2, no murmurs, rubs, clicks or gallops Extremities - peripheral pulses normal, mild pedal edema bilaterally, no clubbing or cyanosis Diabetic Foot Exam - Simple   Simple Foot Form Visual Inspection See comments:  Yes Sensation Testing Intact to touch and monofilament testing bilaterally:  Yes Pulse Check Posterior Tibialis and Dorsalis pulse intact bilaterally:  Yes Comments Large hard callus on the medial aspect of the right big toe      Results for orders placed or performed in visit on 10/19/18  POCT glucose (manual entry)  Result Value Ref Range   POC Glucose 113 (A) 70 - 99 mg/dl  POCT glycosylated hemoglobin (Hb A1C)  Result Value Ref Range   Hemoglobin A1C     HbA1c POC (<> result, manual entry)     HbA1c, POC (prediabetic range) 5.7 5.7 - 6.4 %   HbA1c, POC (controlled diabetic range)     Depression screen [facility] 2/9 10/19/2018 06/19/2018 03/16/2018  Decreased Interest 1 1 2   Down, Depressed, Hopeless 1 1 1   PHQ - 2 Score 2 2 3   Altered sleeping 1 1 1   Tired, decreased energy 2 1 2   Change in appetite 0 2 1  Feeling bad or failure about yourself  2 2 2   Trouble concentrating 3 2 3   Moving slowly or fidgety/restless 1 1 2   Suicidal thoughts 1 1 1   PHQ-9 Score 12 12 15   Some recent data might be hidden   EKG: Normal sinus rhythm left bundle branch block QT is prolonged.  No old EKG to compare. ASSESSMENT AND  PLAN: 1. Controlled type 2 diabetes mellitus without complication, without long-term current use of insulin ([place]) Commended her on trying to be more active. Continue to encourage healthy eating habits.  Counseling given. Continue metformin - POCT glucose (manual entry) - POCT glycosylated hemoglobin (Hb A1C) - CBC - Lipid panel - Comprehensive metabolic panel - Ambulatory referral to Ophthalmology - metFORMIN (GLUCOPHAGE) 500 MG tablet; Take 1 tablet (500 mg total) by mouth daily with breakfast.  Dispense: 90 tablet; Refill: 3  2. Essential hypertension Close to goal.  Continue current medications and low-salt diet. - amLODipine (NORVASC) 10 MG tablet; Take 1 tablet (10 mg total) by mouth daily.  Dispense: 90 tablet; Refill: 3 - hydrochlorothiazide (HYDRODIURIL) 25 MG tablet; Take 1 tablet (25 mg total) by mouth daily.  Dispense: 90 tablet; Refill: 3 - lisinopril (PRINIVIL,ZESTRIL) 40 MG tablet; Take 1 tablet (40 mg total) by mouth daily.  Dispense: 90 tablet; Refill: 3  3. Chest pain, exertional History is concerning and patient has risk factors.  EKG with left bundle branch block.  No old EKG available to compare.  Start low-dose aspirin.  Refer to cardiology - Ambulatory referral to Cardiology - EKG 12-Lead - aspirin EC 81 MG tablet; Take 1 tablet (81 mg total) by mouth daily.  Dispense: 100 tablet; Refill: 1  4. Class 3 severe obesity due to excess calories with serious comorbidity and body mass index (BMI) of 45.0 to 49.9 in adult [facility]) Discussed and encouraged healthy eating habits.  Advised to cut back on white carbohydrates.  Commended her on cutting out sugary drinks.  Advised regular exercise but will hold off on doing so until she has seen the cardiologist for #3 above  5. Pedal edema We discussed that Lyrica can cause swelling in the extremities.  Prior to  Lyrica she was on gabapentin and was having swelling as well.  To determine whether this is causing the swelling, I  recommend that she stop the Lyrica for about 1 to 2 weeks to see whether the swelling resolves. - Brain natriuretic peptide  6. MDD (major depressive disorder), recurrent episode, moderate ([place]) Plugged in with mental health services and doing well.  7. Pre-ulcerative corn or callous - Ambulatory referral to Podiatry  8. Need for vaccination against Streptococcus pneumoniae Given    Patient was given the opportunity to ask questions.  Patient verbalized understanding of the plan and was able to repeat key elements of the plan.   Orders Placed This Encounter  Procedures  . POCT glucose (manual entry)  . POCT glycosylated hemoglobin (Hb A1C)     Requested Prescriptions    No prescriptions requested or ordered in this encounter    No follow-ups on file.  Karle Plumber, MD, FACP

## 2018-10-20 ENCOUNTER — Other Ambulatory Visit: Payer: Self-pay | Admitting: Internal Medicine

## 2018-10-20 LAB — CBC
HEMATOCRIT: 36.5 % (ref 34.0–46.6)
HEMOGLOBIN: 12.1 g/dL (ref 11.1–15.9)
MCH: 26.1 pg — ABNORMAL LOW (ref 26.6–33.0)
MCHC: 33.2 g/dL (ref 31.5–35.7)
MCV: 79 fL (ref 79–97)
Platelets: 368 10*3/uL (ref 150–450)
RBC: 4.63 x10E6/uL (ref 3.77–5.28)
RDW: 15.1 % (ref 11.7–15.4)
WBC: 6.4 10*3/uL (ref 3.4–10.8)

## 2018-10-20 LAB — COMPREHENSIVE METABOLIC PANEL
ALBUMIN: 4.1 g/dL (ref 3.5–5.5)
ALK PHOS: 83 IU/L (ref 39–117)
ALT: 14 IU/L (ref 0–32)
AST: 13 IU/L (ref 0–40)
Albumin/Globulin Ratio: 1.2 (ref 1.2–2.2)
BUN / CREAT RATIO: 19 (ref 9–23)
BUN: 21 mg/dL (ref 6–24)
CHLORIDE: 101 mmol/L (ref 96–106)
CO2: 25 mmol/L (ref 20–29)
Calcium: 9.7 mg/dL (ref 8.7–10.2)
Creatinine, Ser: 1.08 mg/dL — ABNORMAL HIGH (ref 0.57–1.00)
GFR calc Af Amer: 66 mL/min/{1.73_m2} (ref >59)
GFR calc non Af Amer: 58 mL/min/{1.73_m2} — ABNORMAL LOW (ref >59)
GLUCOSE: 90 mg/dL (ref 65–99)
Globulin, Total: 3.3 g/dL (ref 1.5–4.5)
Potassium: 3.9 mmol/L (ref 3.5–5.2)
Sodium: 141 mmol/L (ref 134–144)
Total Protein: 7.4 g/dL (ref 6.0–8.5)

## 2018-10-20 LAB — LIPID PANEL
CHOL/HDL RATIO: 6 ratio — AB (ref 0.0–4.4)
Cholesterol, Total: 210 mg/dL — ABNORMAL HIGH (ref 100–199)
HDL: 35 mg/dL — AB (ref >39)
LDL Calculated: 133 mg/dL — ABNORMAL HIGH (ref 0–99)
Triglycerides: 210 mg/dL — ABNORMAL HIGH (ref 0–149)
VLDL CHOLESTEROL CAL: 42 mg/dL — AB (ref 5–40)

## 2018-10-20 LAB — BRAIN NATRIURETIC PEPTIDE: BNP: 4.8 pg/mL (ref 0.0–100.0)

## 2018-10-20 MED ORDER — ATORVASTATIN CALCIUM 20 MG PO TABS: 20.0000 mg | ORAL_TABLET | Freq: Every day | ORAL | 6 refills | Status: DC

## 2018-10-26 ENCOUNTER — Other Ambulatory Visit (HOSPITAL_COMMUNITY): Payer: Self-pay | Admitting: Psychiatry

## 2018-10-26 DIAGNOSIS — F331 Major depressive disorder, recurrent, moderate: Secondary | ICD-10-CM

## 2018-10-28 ENCOUNTER — Ambulatory Visit (INDEPENDENT_AMBULATORY_CARE_PROVIDER_SITE_OTHER): Payer: Medicare Other | Admitting: Psychology

## 2018-10-28 DIAGNOSIS — F3289 Other specified depressive episodes: Secondary | ICD-10-CM

## 2018-11-03 DIAGNOSIS — H35033 Hypertensive retinopathy, bilateral: Secondary | ICD-10-CM | POA: Diagnosis not present

## 2018-11-03 DIAGNOSIS — H02834 Dermatochalasis of left upper eyelid: Secondary | ICD-10-CM | POA: Diagnosis not present

## 2018-11-03 DIAGNOSIS — H2513 Age-related nuclear cataract, bilateral: Secondary | ICD-10-CM | POA: Diagnosis not present

## 2018-11-03 DIAGNOSIS — H02831 Dermatochalasis of right upper eyelid: Secondary | ICD-10-CM | POA: Diagnosis not present

## 2018-11-03 DIAGNOSIS — E119 Type 2 diabetes mellitus without complications: Secondary | ICD-10-CM | POA: Diagnosis not present

## 2018-11-04 ENCOUNTER — Other Ambulatory Visit (HOSPITAL_COMMUNITY): Payer: Self-pay

## 2018-11-04 DIAGNOSIS — F331 Major depressive disorder, recurrent, moderate: Secondary | ICD-10-CM

## 2018-11-04 MED ORDER — LURASIDONE HCL 40 MG PO TABS: 40.0000 mg | ORAL_TABLET | Freq: Every day | ORAL | 0 refills | Status: DC

## 2018-11-05 ENCOUNTER — Encounter: Payer: Self-pay | Admitting: Podiatry

## 2018-11-05 ENCOUNTER — Ambulatory Visit (INDEPENDENT_AMBULATORY_CARE_PROVIDER_SITE_OTHER): Payer: Medicare Other | Admitting: Podiatry

## 2018-11-05 ENCOUNTER — Ambulatory Visit (INDEPENDENT_AMBULATORY_CARE_PROVIDER_SITE_OTHER): Payer: Medicare Other

## 2018-11-05 ENCOUNTER — Ambulatory Visit: Payer: Medicare Other

## 2018-11-05 VITALS — BP 122/74

## 2018-11-05 DIAGNOSIS — M722 Plantar fascial fibromatosis: Secondary | ICD-10-CM

## 2018-11-05 DIAGNOSIS — M7731 Calcaneal spur, right foot: Secondary | ICD-10-CM | POA: Diagnosis not present

## 2018-11-05 DIAGNOSIS — M7732 Calcaneal spur, left foot: Secondary | ICD-10-CM

## 2018-11-05 NOTE — Patient Instructions (Addendum)
Plantar Fasciitis (Heel Spur Syndrome) with Rehab The plantar fascia is a fibrous, ligament-like, soft-tissue structure that spans the bottom of the foot. Plantar fasciitis is a condition that causes pain in the foot due to inflammation of the tissue. SYMPTOMS   Pain and tenderness on the underneath side of the foot.  Pain that worsens with standing or walking. CAUSES  Plantar fasciitis is caused by irritation and injury to the plantar fascia on the underneath side of the foot. Common mechanisms of injury include:  Direct trauma to bottom of the foot.  Damage to a small nerve that runs under the foot where the main fascia attaches to the heel bone.  Stress placed on the plantar fascia due to bone spurs. RISK INCREASES WITH:   Activities that place stress on the plantar fascia (running, jumping, pivoting, or cutting).  Poor strength and flexibility.  Improperly fitted shoes.  Tight calf muscles.  Flat feet.  Failure to warm-up properly before activity.  Obesity. PREVENTION  Warm up and stretch properly before activity.  Allow for adequate recovery between workouts.  Maintain physical fitness:  Strength, flexibility, and endurance.  Cardiovascular fitness.  Maintain a health body weight.  Avoid stress on the plantar fascia.  Wear properly fitted shoes, including arch supports for individuals who have flat feet.  PROGNOSIS  If treated properly, then the symptoms of plantar fasciitis usually resolve without surgery. However, occasionally surgery is necessary.  RELATED COMPLICATIONS   Recurrent symptoms that may result in a chronic condition.  Problems of the lower back that are caused by compensating for the injury, such as limping.  Pain or weakness of the foot during push-off following surgery.  Chronic inflammation, scarring, and partial or complete fascia tear, occurring more often from repeated injections.  TREATMENT  Treatment initially involves the  use of ice and medication to help reduce pain and inflammation. The use of strengthening and stretching exercises may help reduce pain with activity, especially stretches of the Achilles tendon. These exercises may be performed at home or with a therapist. Your caregiver may recommend that you use heel cups of arch supports to help reduce stress on the plantar fascia. Occasionally, corticosteroid injections are given to reduce inflammation. If symptoms persist for greater than 6 months despite non-surgical (conservative), then surgery may be recommended.   MEDICATION   If pain medication is necessary, then nonsteroidal anti-inflammatory medications, such as aspirin and ibuprofen, or other minor pain relievers, such as acetaminophen, are often recommended.  Do not take pain medication within 7 days before surgery.  Prescription pain relievers may be given if deemed necessary by your caregiver. Use only as directed and only as much as you need.  Corticosteroid injections may be given by your caregiver. These injections should be reserved for the most serious cases, because they may only be given a certain number of times.  HEAT AND COLD  Cold treatment (icing) relieves pain and reduces inflammation. Cold treatment should be applied for 10 to 15 minutes every 2 to 3 hours for inflammation and pain and immediately after any activity that aggravates your symptoms. Use ice packs or massage the area with a piece of ice (ice massage).  Heat treatment may be used prior to performing the stretching and strengthening activities prescribed by your caregiver, physical therapist, or athletic trainer. Use a heat pack or soak the injury in warm water.  SEEK IMMEDIATE MEDICAL CARE IF:  Treatment seems to offer no benefit, or the condition worsens.  Any medications   produce adverse side effects.  EXERCISES- RANGE OF MOTION (ROM) AND STRETCHING EXERCISES - Plantar Fasciitis (Heel Spur Syndrome) These exercises  may help you when beginning to rehabilitate your injury. Your symptoms may resolve with or without further involvement from your physician, physical therapist or athletic trainer. While completing these exercises, remember:   Restoring tissue flexibility helps normal motion to return to the joints. This allows healthier, less painful movement and activity.  An effective stretch should be held for at least 30 seconds.  A stretch should never be painful. You should only feel a gentle lengthening or release in the stretched tissue.  RANGE OF MOTION - Toe Extension, Flexion  Sit with your right / left leg crossed over your opposite knee.  Grasp your toes and gently pull them back toward the top of your foot. You should feel a stretch on the bottom of your toes and/or foot.  Hold this stretch for 10 seconds.  Now, gently pull your toes toward the bottom of your foot. You should feel a stretch on the top of your toes and or foot.  Hold this stretch for 10 seconds. Repeat  times. Complete this stretch 3 times per day.   RANGE OF MOTION - Ankle Dorsiflexion, Active Assisted  Remove shoes and sit on a chair that is preferably not on a carpeted surface.  Place right / left foot under knee. Extend your opposite leg for support.  Keeping your heel down, slide your right / left foot back toward the chair until you feel a stretch at your ankle or calf. If you do not feel a stretch, slide your bottom forward to the edge of the chair, while still keeping your heel down.  Hold this stretch for 10 seconds. Repeat 3 times. Complete this stretch 2 times per day.   STRETCH  Gastroc, Standing  Place hands on wall.  Extend right / left leg, keeping the front knee somewhat bent.  Slightly point your toes inward on your back foot.  Keeping your right / left heel on the floor and your knee straight, shift your weight toward the wall, not allowing your back to arch.  You should feel a gentle stretch  in the right / left calf. Hold this position for 10 seconds. Repeat 3 times. Complete this stretch 2 times per day.  STRETCH  Soleus, Standing  Place hands on wall.  Extend right / left leg, keeping the other knee somewhat bent.  Slightly point your toes inward on your back foot.  Keep your right / left heel on the floor, bend your back knee, and slightly shift your weight over the back leg so that you feel a gentle stretch deep in your back calf.  Hold this position for 10 seconds. Repeat 3 times. Complete this stretch 2 times per day.  STRETCH  Gastrocsoleus, Standing  Note: This exercise can place a lot of stress on your foot and ankle. Please complete this exercise only if specifically instructed by your caregiver.   Place the ball of your right / left foot on a step, keeping your other foot firmly on the same step.  Hold on to the wall or a rail for balance.  Slowly lift your other foot, allowing your body weight to press your heel down over the edge of the step.  You should feel a stretch in your right / left calf.  Hold this position for 10 seconds.  Repeat this exercise with a slight bend in your right /   left knee. Repeat 3 times. Complete this stretch 2 times per day.   STRENGTHENING EXERCISES - Plantar Fasciitis (Heel Spur Syndrome)  These exercises may help you when beginning to rehabilitate your injury. They may resolve your symptoms with or without further involvement from your physician, physical therapist or athletic trainer. While completing these exercises, remember:   Muscles can gain both the endurance and the strength needed for everyday activities through controlled exercises.  Complete these exercises as instructed by your physician, physical therapist or athletic trainer. Progress the resistance and repetitions only as guided.  STRENGTH - Towel Curls  Sit in a chair positioned on a non-carpeted surface.  Place your foot on a towel, keeping your heel  on the floor.  Pull the towel toward your heel by only curling your toes. Keep your heel on the floor. Repeat 3 times. Complete this exercise 2 times per day.  STRENGTH - Ankle Inversion  Secure one end of a rubber exercise band/tubing to a fixed object (table, pole). Loop the other end around your foot just before your toes.  Place your fists between your knees. This will focus your strengthening at your ankle.  Slowly, pull your big toe up and in, making sure the band/tubing is positioned to resist the entire motion.  Hold this position for 10 seconds.  Have your muscles resist the band/tubing as it slowly pulls your foot back to the starting position. Repeat 3 times. Complete this exercises 2 times per day.  Document Released: 09/30/2005 Document Revised: 12/23/2011 Document Reviewed: 01/12/2009 [facility] Patient Information 2014 [place], [place]. Corns and Calluses Corns are small areas of thickened skin that occur on the top, sides, or tip of a toe. They contain a cone-shaped core with a point that can press on a nerve below. This causes pain.  Calluses are areas of thickened skin that can occur anywhere on the body, including the hands, fingers, palms, soles of the feet, and heels. Calluses are usually larger than corns. What are the causes? Corns and calluses are caused by rubbing (friction) or pressure, such as from shoes that are too tight or do not fit properly. What increases the risk? Corns are more likely to develop in people who have misshapen toes (toe deformities), such as hammer toes. Calluses can occur with friction to any area of the skin. They are more likely to develop in people who:  Work with their hands.  Wear shoes that fit poorly, are too tight, or are high-heeled.  Have toe deformities. What are the signs or symptoms? Symptoms of a corn or callus include:  A hard growth on the skin.  Pain or tenderness under the skin.  Redness and  swelling.  Increased discomfort while wearing tight-fitting shoes, if your feet are affected. If a corn or callus becomes infected, symptoms may include:  Redness and swelling that gets worse.  Pain.  Fluid, blood, or pus draining from the corn or callus. How is this diagnosed? Corns and calluses may be diagnosed based on your symptoms, your medical history, and a physical exam. How is this treated? Treatment for corns and calluses may include:  Removing the cause of the friction or pressure. This may involve: ? Changing your shoes. ? Wearing shoe inserts (orthotics) or other protective layers in your shoes, such as a corn pad. ? Wearing gloves.  Applying medicine to the skin (topical medicine) to help soften skin in the hardened, thickened areas.  Removing layers of dead skin with a file to  reduce the size of the corn or callus.  Removing the corn or callus with a scalpel or laser.  Taking antibiotic medicines, if your corn or callus is infected.  Having surgery, if a toe deformity is the cause. Follow these instructions at home:   Take over-the-counter and prescription medicines only as told by your health care provider.  If you were prescribed an antibiotic, take it as told by your health care provider. Do not stop taking it even if your condition starts to improve.  Wear shoes that fit well. Avoid wearing high-heeled shoes and shoes that are too tight or too loose.  Wear any padding, protective layers, gloves, or orthotics as told by your health care provider.  Soak your hands or feet and then use a file or pumice stone to soften your corn or callus. Do this as told by your health care provider.  Check your corn or callus every day for symptoms of infection. Contact a health care provider if you:  Notice that your symptoms do not improve with treatment.  Have redness or swelling that gets worse.  Notice that your corn or callus becomes painful.  Have fluid,  blood, or pus coming from your corn or callus.  Have new symptoms. Summary  Corns are small areas of thickened skin that occur on the top, sides, or tip of a toe.  Calluses are areas of thickened skin that can occur anywhere on the body, including the hands, fingers, palms, and soles of the feet. Calluses are usually larger than corns.  Corns and calluses are caused by rubbing (friction) or pressure, such as from shoes that are too tight or do not fit properly.  Treatment may include wearing any padding, protective layers, gloves, or orthotics as told by your health care provider. This information is not intended to replace advice given to you by your health care provider. Make sure you discuss any questions you have with your health care provider. Document Released: 07/06/2004 Document Revised: 08/13/2017 Document Reviewed: 08/13/2017 [employer] Interactive Patient Education  2019 [employer]  Diabetes Mellitus and [place] care is an important part of your health, especially when you have diabetes. Diabetes may cause you to have problems because of poor blood flow (circulation) to your feet and legs, which can cause your skin to:  Become thinner and drier.  Break more easily.  Heal more slowly.  Peel and crack. You may also have nerve damage (neuropathy) in your legs and feet, causing decreased feeling in them. This means that you may not notice minor injuries to your feet that could lead to more serious problems. Noticing and addressing any potential problems early is the best way to prevent future foot problems. How to care for your feet Foot hygiene  Wash your feet daily with warm water and mild soap. Do not use hot water. Then, pat your feet and the areas between your toes until they are completely dry. Do not soak your feet as this can dry your skin.  Trim your toenails straight across. Do not dig under them or around the cuticle. File the edges of your nails with an  emery board or nail file.  Apply a moisturizing lotion or petroleum jelly to the skin on your feet and to dry, brittle toenails. Use lotion that does not contain alcohol and is unscented. Do not apply lotion between your toes. Shoes and socks  Wear clean socks or stockings every day. Make sure they are not too tight.  Do not wear knee-high stockings since they may decrease blood flow to your legs.  Wear shoes that fit properly and have enough cushioning. Always look in your shoes before you put them on to be sure there are no objects inside.  To break in new shoes, wear them for just a few hours a day. This prevents injuries on your feet. Wounds, scrapes, corns, and calluses  Check your feet daily for blisters, cuts, bruises, sores, and redness. If you cannot see the bottom of your feet, use a mirror or ask someone for help.  Do not cut corns or calluses or try to remove them with medicine.  If you find a minor scrape, cut, or break in the skin on your feet, keep it and the skin around it clean and dry. You may clean these areas with mild soap and water. Do not clean the area with peroxide, alcohol, or iodine.  If you have a wound, scrape, corn, or callus on your foot, look at it several times a day to make sure it is healing and not infected. Check for: ? Redness, swelling, or pain. ? Fluid or blood. ? Warmth. ? Pus or a bad smell. General instructions  Do not cross your legs. This may decrease blood flow to your feet.  Do not use heating pads or hot water bottles on your feet. They may burn your skin. If you have lost feeling in your feet or legs, you may not know this is happening until it is too late.  Protect your feet from hot and cold by wearing shoes, such as at the beach or on hot pavement.  Schedule a complete foot exam at least once a year (annually) or more often if you have foot problems. If you have foot problems, report any cuts, sores, or bruises to your health care  provider immediately. Contact a health care provider if:  You have a medical condition that increases your risk of infection and you have any cuts, sores, or bruises on your feet.  You have an injury that is not healing.  You have redness on your legs or feet.  You feel burning or tingling in your legs or feet.  You have pain or cramps in your legs and feet.  Your legs or feet are numb.  Your feet always feel cold.  You have pain around a toenail. Get help right away if:  You have a wound, scrape, corn, or callus on your foot and: ? You have pain, swelling, or redness that gets worse. ? You have fluid or blood coming from the wound, scrape, corn, or callus. ? Your wound, scrape, corn, or callus feels warm to the touch. ? You have pus or a bad smell coming from the wound, scrape, corn, or callus. ? You have a fever. ? You have a red line going up your leg. Summary  Check your feet every day for cuts, sores, red spots, swelling, and blisters.  Moisturize feet and legs daily.  Wear shoes that fit properly and have enough cushioning.  If you have foot problems, report any cuts, sores, or bruises to your health care provider immediately.  Schedule a complete foot exam at least once a year (annually) or more often if you have foot problems. This information is not intended to replace advice given to you by your health care provider. Make sure you discuss any questions you have with your health care provider. Document Released: 09/27/2000 Document Revised: 11/12/2017 Document Reviewed: 11/01/2016  [employer] Interactive Patient Education  2019 [employer]    Plantar Fasciitis (Heel Spur Syndrome) with Rehab The plantar fascia is a fibrous, ligament-like, soft-tissue structure that spans the bottom of the foot. Plantar fasciitis is a condition that causes pain in the foot due to inflammation of the tissue. SYMPTOMS   Pain and tenderness on the underneath side of the  foot.  Pain that worsens with standing or walking. CAUSES  Plantar fasciitis is caused by irritation and injury to the plantar fascia on the underneath side of the foot. Common mechanisms of injury include:  Direct trauma to bottom of the foot.  Damage to a small nerve that runs under the foot where the main fascia attaches to the heel bone.  Stress placed on the plantar fascia due to bone spurs. RISK INCREASES WITH:   Activities that place stress on the plantar fascia (running, jumping, pivoting, or cutting).  Poor strength and flexibility.  Improperly fitted shoes.  Tight calf muscles.  Flat feet.  Failure to warm-up properly before activity.  Obesity. PREVENTION  Warm up and stretch properly before activity.  Allow for adequate recovery between workouts.  Maintain physical fitness:  Strength, flexibility, and endurance.  Cardiovascular fitness.  Maintain a health body weight.  Avoid stress on the plantar fascia.  Wear properly fitted shoes, including arch supports for individuals who have flat feet.  PROGNOSIS  If treated properly, then the symptoms of plantar fasciitis usually resolve without surgery. However, occasionally surgery is necessary.  RELATED COMPLICATIONS   Recurrent symptoms that may result in a chronic condition.  Problems of the lower back that are caused by compensating for the injury, such as limping.  Pain or weakness of the foot during push-off following surgery.  Chronic inflammation, scarring, and partial or complete fascia tear, occurring more often from repeated injections.  TREATMENT  Treatment initially involves the use of ice and medication to help reduce pain and inflammation. The use of strengthening and stretching exercises may help reduce pain with activity, especially stretches of the Achilles tendon. These exercises may be performed at home or with a therapist. Your caregiver may recommend that you use heel cups of arch  supports to help reduce stress on the plantar fascia. Occasionally, corticosteroid injections are given to reduce inflammation. If symptoms persist for greater than 6 months despite non-surgical (conservative), then surgery may be recommended.   MEDICATION   If pain medication is necessary, then nonsteroidal anti-inflammatory medications, such as aspirin and ibuprofen, or other minor pain relievers, such as acetaminophen, are often recommended.  Do not take pain medication within 7 days before surgery.  Prescription pain relievers may be given if deemed necessary by your caregiver. Use only as directed and only as much as you need.  Corticosteroid injections may be given by your caregiver. These injections should be reserved for the most serious cases, because they may only be given a certain number of times.  HEAT AND COLD  Cold treatment (icing) relieves pain and reduces inflammation. Cold treatment should be applied for 10 to 15 minutes every 2 to 3 hours for inflammation and pain and immediately after any activity that aggravates your symptoms. Use ice packs or massage the area with a piece of ice (ice massage).  Heat treatment may be used prior to performing the stretching and strengthening activities prescribed by your caregiver, physical therapist, or athletic trainer. Use a heat pack or soak the injury in warm water.  SEEK IMMEDIATE MEDICAL CARE IF:  Treatment  seems to offer no benefit, or the condition worsens.  Any medications produce adverse side effects.  EXERCISES- RANGE OF MOTION (ROM) AND STRETCHING EXERCISES - Plantar Fasciitis (Heel Spur Syndrome) These exercises may help you when beginning to rehabilitate your injury. Your symptoms may resolve with or without further involvement from your physician, physical therapist or athletic trainer. While completing these exercises, remember:   Restoring tissue flexibility helps normal motion to return to the joints. This allows  healthier, less painful movement and activity.  An effective stretch should be held for at least 30 seconds.  A stretch should never be painful. You should only feel a gentle lengthening or release in the stretched tissue.  RANGE OF MOTION - Toe Extension, Flexion  Sit with your right / left leg crossed over your opposite knee.  Grasp your toes and gently pull them back toward the top of your foot. You should feel a stretch on the bottom of your toes and/or foot.  Hold this stretch for 10 seconds.  Now, gently pull your toes toward the bottom of your foot. You should feel a stretch on the top of your toes and or foot.  Hold this stretch for 10 seconds. Repeat  times. Complete this stretch 3 times per day.   RANGE OF MOTION - Ankle Dorsiflexion, Active Assisted  Remove shoes and sit on a chair that is preferably not on a carpeted surface.  Place right / left foot under knee. Extend your opposite leg for support.  Keeping your heel down, slide your right / left foot back toward the chair until you feel a stretch at your ankle or calf. If you do not feel a stretch, slide your bottom forward to the edge of the chair, while still keeping your heel down.  Hold this stretch for 10 seconds. Repeat 3 times. Complete this stretch 2 times per day.   STRETCH  Gastroc, Standing  Place hands on wall.  Extend right / left leg, keeping the front knee somewhat bent.  Slightly point your toes inward on your back foot.  Keeping your right / left heel on the floor and your knee straight, shift your weight toward the wall, not allowing your back to arch.  You should feel a gentle stretch in the right / left calf. Hold this position for 10 seconds. Repeat 3 times. Complete this stretch 2 times per day.  STRETCH  Soleus, Standing  Place hands on wall.  Extend right / left leg, keeping the other knee somewhat bent.  Slightly point your toes inward on your back foot.  Keep your right / left  heel on the floor, bend your back knee, and slightly shift your weight over the back leg so that you feel a gentle stretch deep in your back calf.  Hold this position for 10 seconds. Repeat 3 times. Complete this stretch 2 times per day.  STRETCH  Gastrocsoleus, Standing  Note: This exercise can place a lot of stress on your foot and ankle. Please complete this exercise only if specifically instructed by your caregiver.   Place the ball of your right / left foot on a step, keeping your other foot firmly on the same step.  Hold on to the wall or a rail for balance.  Slowly lift your other foot, allowing your body weight to press your heel down over the edge of the step.  You should feel a stretch in your right / left calf.  Hold this position for 10 seconds.  Repeat this exercise with a slight bend in your right / left knee. Repeat 3 times. Complete this stretch 2 times per day.   STRENGTHENING EXERCISES - Plantar Fasciitis (Heel Spur Syndrome)  These exercises may help you when beginning to rehabilitate your injury. They may resolve your symptoms with or without further involvement from your physician, physical therapist or athletic trainer. While completing these exercises, remember:   Muscles can gain both the endurance and the strength needed for everyday activities through controlled exercises.  Complete these exercises as instructed by your physician, physical therapist or athletic trainer. Progress the resistance and repetitions only as guided.  STRENGTH - Towel Curls  Sit in a chair positioned on a non-carpeted surface.  Place your foot on a towel, keeping your heel on the floor.  Pull the towel toward your heel by only curling your toes. Keep your heel on the floor. Repeat 3 times. Complete this exercise 2 times per day.  STRENGTH - Ankle Inversion  Secure one end of a rubber exercise band/tubing to a fixed object (table, pole). Loop the other end around your foot just  before your toes.  Place your fists between your knees. This will focus your strengthening at your ankle.  Slowly, pull your big toe up and in, making sure the band/tubing is positioned to resist the entire motion.  Hold this position for 10 seconds.  Have your muscles resist the band/tubing as it slowly pulls your foot back to the starting position. Repeat 3 times. Complete this exercises 2 times per day.  Document Released: 09/30/2005 Document Revised: 12/23/2011 Document Reviewed: 01/12/2009 [facility] Patient Information 2014 [place], [place].

## 2018-11-06 ENCOUNTER — Ambulatory Visit (INDEPENDENT_AMBULATORY_CARE_PROVIDER_SITE_OTHER): Payer: Medicare Other | Admitting: Psychiatry

## 2018-11-06 ENCOUNTER — Encounter (HOSPITAL_COMMUNITY): Payer: Self-pay | Admitting: Psychiatry

## 2018-11-06 DIAGNOSIS — F4310 Post-traumatic stress disorder, unspecified: Secondary | ICD-10-CM | POA: Diagnosis not present

## 2018-11-06 DIAGNOSIS — F331 Major depressive disorder, recurrent, moderate: Secondary | ICD-10-CM

## 2018-11-06 MED ORDER — LURASIDONE HCL 40 MG PO TABS: 40.0000 mg | ORAL_TABLET | Freq: Every day | ORAL | 0 refills | Status: DC

## 2018-11-06 MED ORDER — DULOXETINE HCL 30 MG PO CPEP: 90.0000 mg | ORAL_CAPSULE | Freq: Every day | ORAL | 2 refills | Status: DC
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
## 2018-11-11 ENCOUNTER — Ambulatory Visit (INDEPENDENT_AMBULATORY_CARE_PROVIDER_SITE_OTHER): Payer: Medicare Other | Admitting: Psychology

## 2018-11-11 DIAGNOSIS — F3289 Other specified depressive episodes: Secondary | ICD-10-CM | POA: Diagnosis not present

## 2018-11-13 ENCOUNTER — Other Ambulatory Visit: Payer: Self-pay | Admitting: Internal Medicine

## 2018-11-16 NOTE — Telephone Encounter (Signed)
Pt last seen: 10/19/18 Next appt: 01/18/19 Last RX written on: 06/19/18 Date of original fill: 06/19/18 Date of refill(s): 07/20/18, 08/31/18, 10/13/18  No other controlled substances were filled during this time, please refill if appropriate.    **Pt has multiple profiles in PMP Aware due to her hyphenated last name**

## 2018-11-17 DIAGNOSIS — Z6841 Body Mass Index (BMI) 40.0 and over, adult: Secondary | ICD-10-CM | POA: Diagnosis not present

## 2018-11-17 DIAGNOSIS — M4316 Spondylolisthesis, lumbar region: Secondary | ICD-10-CM | POA: Diagnosis not present

## 2018-11-17 DIAGNOSIS — I10 Essential (primary) hypertension: Secondary | ICD-10-CM | POA: Diagnosis not present

## 2018-11-17 NOTE — Progress Notes (Signed)
SUBJECTIVE: Michele Wood is accompanied by her husband this morning.  She presents to clinic on today with cc of painful bilateral  heels.  Patient admits pain with first step in the morning and after periods of rest. Duration of pain is greater than 3 years. She was seen in the ED about 3 years ago and diagnosed with "bone spurs".   Prior treatments include none, other than the ED visit.  Past Medical History:  Diagnosis Date  . Chronic back pain   . DDD (degenerative disc disease), cervical   . Depression   . Diabetes mellitus without complication ([place])   . Hypertension    at age 24  . Obesity   . Sinusitis   . Sleep apnea    at age 67     Patient Active Problem List   Diagnosis Date Noted  . MDD (major depressive disorder), recurrent episode, moderate ([place]) 10/19/2018  . New onset type 2 diabetes mellitus ([place]) 12/12/2017  . Class 3 severe obesity with serious comorbidity and body mass index (BMI) of 45.0 to 49.9 in adult ([place]) 09/12/2017  . Anxiety and depression 06/13/2017  . Cervical radiculopathy due to intervertebral disc disorder 06/13/2017  . Chronic low back pain without sciatica 09/18/2015  . Degenerative cervical spinal stenosis 06/08/2015  . Vitamin D insufficiency 04/25/2014  . HTN (hypertension) 06/21/2008     Past Surgical History:  Procedure Laterality Date  . ABDOMINAL HYSTERECTOMY    . BREAST BIOPSY Right 07/02/2006   [place] Core benign  . NASAL SINUS SURGERY       Current Outpatient Medications:  .  amLODipine (NORVASC) 10 MG tablet, Take 1 tablet (10 mg total) by mouth daily., Disp: 90 tablet, Rfl: 3 .  aspirin EC 81 MG tablet, Take 1 tablet (81 mg total) by mouth daily., Disp: 100 tablet, Rfl: 1 .  atorvastatin (LIPITOR) 20 MG tablet, Take 1 tablet (20 mg total) by mouth daily., Disp: 30 tablet, Rfl: 6 .  Cholecalciferol (VITAMIN D3) 2000 UNITS TABS, Take 2,000 Units by mouth daily., Disp: 30 tablet, Rfl: 11 .  cyclobenzaprine (FLEXERIL) 10  MG tablet, TAKE 1 TABLET BY MOUTH 3 TIMES DAILY AS NEEDED FOR MUSCLE SPASMS, Disp: 60 tablet, Rfl: 0 .  hydrochlorothiazide (HYDRODIURIL) 25 MG tablet, Take 1 tablet (25 mg total) by mouth daily., Disp: 90 tablet, Rfl: 3 .  lisinopril (PRINIVIL,ZESTRIL) 40 MG tablet, Take 1 tablet (40 mg total) by mouth daily., Disp: 90 tablet, Rfl: 3 .  loratadine (CLARITIN) 10 MG tablet, Take 1 tablet (10 mg total) by mouth daily., Disp: 30 tablet, Rfl: 11 .  meloxicam (MOBIC) 15 MG tablet, TAKE 1 TABLET BY MOUTH DAILY., Disp: 30 tablet, Rfl: 0 .  metFORMIN (GLUCOPHAGE) 500 MG tablet, Take 1 tablet (500 mg total) by mouth daily with breakfast., Disp: 90 tablet, Rfl: 3 .  montelukast (SINGULAIR) 10 MG tablet, Take 1 tablet (10 mg total) by mouth at bedtime., Disp: 30 tablet, Rfl: 3 .  omeprazole (PRILOSEC) 20 MG capsule, Take 1 capsule (20 mg total) by mouth 2 (two) times daily before a meal., Disp: 30 capsule, Rfl: 0 .  pregabalin (LYRICA) 50 MG capsule, Take 1 capsule (50 mg total) by mouth 3 (three) times daily., Disp: 90 capsule, Rfl: 3 .  triamcinolone (NASACORT AQ) 55 MCG/ACT AERO nasal inhaler, Place 2 sprays into the nose daily., Disp: 1 Inhaler, Rfl: 12 .  DULoxetine (CYMBALTA) 30 MG capsule, Take 3 capsules (90 mg total) by mouth daily., Disp:  90 capsule, Rfl: 2 .  lurasidone (LATUDA) 40 MG TABS tablet, Take 1 tablet (40 mg total) by mouth daily with breakfast., Disp: 90 tablet, Rfl: 0   Allergies  Allergen Reactions  . Milk-Related Compounds Diarrhea and Other (See Comments)    Diarrhea and stomach upset  . Codeine Sulfate Rash     Social History   Occupational History  . Not on file  Tobacco Use  . Smoking status: Never Smoker  . Smokeless tobacco: Never Used  Substance and Sexual Activity  . Alcohol use: Yes    Alcohol/week: 0.0 standard drinks    Comment: rare  . Drug use: No  . Sexual activity: Not Currently     Family History  Problem Relation Age of Onset  . Hypertension Mother    . Hypertension Other   . Schizophrenia Brother      Immunization History  Administered Date(s) Administered  . Influenza Whole 09/13/2008  . Influenza,inj,Quad PF,6+ Mos 09/22/2014, 09/18/2015, 08/22/2016, 06/13/2017, 06/19/2018  . Pneumococcal Polysaccharide-23 10/19/2018  . Tdap 09/03/2016     Review of systems: Positive Findings in bold print.  Constitutional:  chills, fatigue, fever, sweats, weight change Communication: Optometrist, sign Ecologist, hand writing, iPad/Android device Head: headaches, head injury Eyes: changes in vision, eye pain, glaucoma, cataracts, macular degeneration, diplopia, glare,  light sensitivity, eyeglasses or contacts, blindness Ears nose mouth throat: Hard of hearing, ringing in ears, deaf, sign language,  vertigo,   nosebleeds,  rhinitis,  cold sores, snoring, swollen glands, sinusitis Cardiovascular: HTN, edema, arrhythmia, pacemaker in place, defibrillator in place,  chest pain/tightness, chronic anticoagulation, blood clot, heart failure Peripheral Vascular: leg cramps, varicose veins, blood clots, lymphedema Respiratory:  difficulty breathing, denies congestion, SOB, wheezing, cough, emphysema, sleep apnea Gastrointestinal: change in appetite or weight, abdominal pain, constipation, diarrhea, nausea, vomiting, vomiting blood, change in bowel habits, abdominal pain, jaundice, rectal bleeding, hemorrhoids, Genitourinary:  nocturia,  pain on urination,  blood in urine, Foley catheter, urinary urgency Musculoskeletal: uses mobility aid,  cramping, stiff joints, painful joints, decreased joint motion, fractures, OA, gout Skin: +changes in toenails, color change, dryness, itching, mole changes,  rash  Neurological: headaches, numbness in feet, paresthesias in feet, burning in feet, fainting,  seizures, change in speech. denies headaches, memory problems/poor historian, cerebral palsy, weakness, paralysis Endocrine: diabetes, hypothyroidism,  hyperthyroidism,  goiter, dry mouth, flushing, heat intolerance,  cold intolerance,  excessive thirst, denies polyuria,  nocturia Hematological:  easy bleeding, excessive bleeding, easy bruising, enlarged lymph nodes, on long term blood thinner, history of past transusions Allergy/immunological:  hives, eczema, frequent infections, multiple drug allergies, seasonal allergies, transplant recipient Psychiatric:  anxiety, depression, mood disorder, suicidal ideations, hallucinations   OBJECTIVE: Vitals:   11/05/18 0927  BP: 122/74   Vascular: Capillary refill time immediate x 10 digits. Dorsalis pedis and posterior tibial pulses palpable bilaterally. No pedal edema b/l. No varicosities b/l. Skin temperature gradient WNL b/l.  Dermatological: Normal skin turgor, texture and tone b/l No skin eruptions noted b/l Nails 1-5 b/l well maintained.  Neurological: Epicritic sensation grossly intact b/l and symmetrically with 10 gram monofilament. Vibratory sensation intact b/l.   Musculoskeletal: Muscle strength 5/5 to all LE muscle groups b/l Negative Tinel's sign b/l Pain on palpation noted medial tubercle bilateral heels  Xray findings: bilateral heels Significant for plantar calcaneal spurs b/l  ASSESSMENT: 1. Plantar fasciitis bilateral feet 2. Heel spur bilateral feet  PLAN: 1. Patient examined today. 2. Xrays taken b/l feet and discussed findings with patient. 3. Options  for plantar fasciitis discussed included stretching exercises, night splint, plantar fascial strap, oral anti-inflammatories, icing, protection, local steroid injection, and arch supports. 4. Discussed etiology, pathology, conservative vs. surgical therapies. At this time a plantar fascial injection was recommended.  The patient agreed and a sterile skin prep was applied.  An injection consisting of a 1cc of a  1:1 mixture dexamethasone and marcaine  was infiltrated at the point of maximal tenderness to both  heels.  The patient tolerated this well and was given instructions for aftercare.   5. She is taking Meloxicam presently and may continue on current dosage. 6. Recommended change in shoe gear to [employer] sneakers series 600 or higher. 7. Dispensed literature on stretching exercises. 5.   Follow up 1 month.

## 2018-11-18 NOTE — Telephone Encounter (Signed)
[facility] to call in Lyrica Rx. Spoke to [place]  Name: Lyrica Strength: 50Mg  Sig: Take 1 capsule by mouth three times a day Quantity: 90 Refills: 3

## 2018-11-21 ENCOUNTER — Other Ambulatory Visit: Payer: Self-pay

## 2018-11-21 ENCOUNTER — Emergency Department (HOSPITAL_COMMUNITY): Payer: Medicare Other

## 2018-11-21 ENCOUNTER — Emergency Department (HOSPITAL_COMMUNITY)
Admission: EM | Admit: 2018-11-21 | Discharge: 2018-11-22 | Disposition: A | Discharge status: Home / Self Care | Payer: Medicare Other | Attending: Emergency Medicine | Admitting: Emergency Medicine

## 2018-11-21 ENCOUNTER — Encounter (HOSPITAL_COMMUNITY): Payer: Self-pay | Admitting: *Deleted

## 2018-11-21 DIAGNOSIS — Z7984 Long term (current) use of oral hypoglycemic drugs: Secondary | ICD-10-CM | POA: Insufficient documentation

## 2018-11-21 DIAGNOSIS — I10 Essential (primary) hypertension: Secondary | ICD-10-CM | POA: Diagnosis not present

## 2018-11-21 DIAGNOSIS — Z79899 Other long term (current) drug therapy: Secondary | ICD-10-CM | POA: Insufficient documentation

## 2018-11-21 DIAGNOSIS — Z7982 Long term (current) use of aspirin: Secondary | ICD-10-CM | POA: Insufficient documentation

## 2018-11-21 DIAGNOSIS — R079 Chest pain, unspecified: Secondary | ICD-10-CM

## 2018-11-21 DIAGNOSIS — R0789 Other chest pain: Secondary | ICD-10-CM | POA: Insufficient documentation

## 2018-11-21 DIAGNOSIS — E119 Type 2 diabetes mellitus without complications: Secondary | ICD-10-CM | POA: Diagnosis not present

## 2018-11-21 LAB — BASIC METABOLIC PANEL
Anion gap: 11 (ref 5–15)
BUN: 15 mg/dL (ref 6–20)
CO2: 23 mmol/L (ref 22–32)
Calcium: 8.9 mg/dL (ref 8.9–10.3)
Chloride: 106 mmol/L (ref 98–111)
Creatinine, Ser: 0.99 mg/dL (ref 0.44–1.00)
GFR calc Af Amer: >60 mL/min (ref >60)
GFR calc non Af Amer: >60 mL/min (ref >60)
Glucose, Bld: 127 mg/dL — ABNORMAL HIGH (ref 70–99)
Potassium: 3.5 mmol/L (ref 3.5–5.1)
Sodium: 140 mmol/L (ref 135–145)

## 2018-11-21 LAB — I-STAT BETA HCG BLOOD, ED (NOT ORDERABLE): I-stat hCG, quantitative: <5 m[IU]/mL (ref <5)

## 2018-11-21 LAB — CBC
HCT: 38.3 % (ref 36.0–46.0)
Hemoglobin: 11.7 g/dL — ABNORMAL LOW (ref 12.0–15.0)
MCH: 26 pg (ref 26.0–34.0)
MCHC: 30.5 g/dL (ref 30.0–36.0)
MCV: 85.1 fL (ref 80.0–100.0)
Platelets: 328 10*3/uL (ref 150–400)
RBC: 4.5 MIL/uL (ref 3.87–5.11)
RDW: 15.8 % — ABNORMAL HIGH (ref 11.5–15.5)
WBC: 9.3 10*3/uL (ref 4.0–10.5)
nRBC: 0 % (ref 0.0–0.2)

## 2018-11-21 LAB — POCT I-STAT TROPONIN I: Troponin i, poc: 0.02 ng/mL (ref 0.00–0.08)

## 2018-11-21 LAB — D-DIMER, QUANTITATIVE: D-Dimer, Quant: 0.38 ug/mL-FEU (ref 0.00–0.50)

## 2018-11-21 LAB — TROPONIN I: Troponin I: <0.03 ng/mL (ref <0.03)

## 2018-11-21 NOTE — ED Provider Notes (Signed)
[facility] DEPT Provider Note   CSN: [id number] Arrival date & time: 11/21/18  1824     History   Chief Complaint Chief Complaint  Patient presents with  . Chest Pain    HPI Michele Wood is a 58 y.o. female.  The history is provided by the patient and medical records. No language interpreter was used.  Chest Pain   Michele Wood is a 58 y.o. female who presents to the Emergency Department complaining of chest pain. She presents for evaluation of intermittent chest pain for the last month. Initially she had a scraping sensation in her chest but now it is intermittent pressure in her chest. Pain episodes last about 20 to 30 minutes. There are no clear alleviating or worsening factors although it does ease off at times if she sits down to rest. She does feel like she has her breath catch at times in his short of breath. Last episode of pain was yesterday. He denies any fevers, cough, abdominal pain, nausea, vomiting, diaphoresis, leg swelling or pain. She has a history of hypertension, diabetes. No personal history of clotting disorder or cardiac disease. She does have a family history of cardiac disease in her brother. Past Medical History:  Diagnosis Date  . Chronic back pain   . DDD (degenerative disc disease), cervical   . Depression   . Diabetes mellitus without complication ([place])   . Hypertension    at age 56  . Obesity   . Sinusitis   . Sleep apnea    at age 82    Patient Active Problem List   Diagnosis Date Noted  . MDD (major depressive disorder), recurrent episode, moderate ([place]) 10/19/2018  . New onset type 2 diabetes mellitus ([place]) 12/12/2017  . Class 3 severe obesity with serious comorbidity and body mass index (BMI) of 45.0 to 49.9 in adult ([place]) 09/12/2017  . Anxiety and depression 06/13/2017  . Cervical radiculopathy due to intervertebral disc disorder 06/13/2017  . Chronic low back pain without sciatica  09/18/2015  . Degenerative cervical spinal stenosis 06/08/2015  . Vitamin D insufficiency 04/25/2014  . HTN (hypertension) 06/21/2008    Past Surgical History:  Procedure Laterality Date  . ABDOMINAL HYSTERECTOMY    . BREAST BIOPSY Right 07/02/2006   [place] Core benign  . NASAL SINUS SURGERY       OB History   No obstetric history on file.      Home Medications    Prior to Admission medications   Medication Sig Start Date End Date Taking? Authorizing Provider  amLODipine (NORVASC) 10 MG tablet Take 1 tablet (10 mg total) by mouth daily. 10/19/18   Ladell Pier, MD  aspirin EC 81 MG tablet Take 1 tablet (81 mg total) by mouth daily. 10/19/18   Ladell Pier, MD  atorvastatin (LIPITOR) 20 MG tablet Take 1 tablet (20 mg total) by mouth daily. 10/20/18   Ladell Pier, MD  Cholecalciferol (VITAMIN D3) 2000 UNITS TABS Take 2,000 Units by mouth daily. 09/19/15   Funches, Adriana Mccallum, MD  cyclobenzaprine (FLEXERIL) 10 MG tablet TAKE 1 TABLET BY MOUTH 3 TIMES DAILY AS NEEDED FOR MUSCLE SPASMS 02/27/17   Funches, Adriana Mccallum, MD  DULoxetine (CYMBALTA) 30 MG capsule Take 3 capsules (90 mg total) by mouth daily. 11/06/18   Arfeen, Arlyce Harman, MD  hydrochlorothiazide (HYDRODIURIL) 25 MG tablet Take 1 tablet (25 mg total) by mouth daily. 10/19/18   Ladell Pier, MD  lisinopril (PRINIVIL,ZESTRIL) 40 MG  tablet Take 1 tablet (40 mg total) by mouth daily. 10/19/18   Ladell Pier, MD  loratadine (CLARITIN) 10 MG tablet Take 1 tablet (10 mg total) by mouth daily. 08/22/16   Funches, Adriana Mccallum, MD  lurasidone (LATUDA) 40 MG TABS tablet Take 1 tablet (40 mg total) by mouth daily with breakfast. 11/06/18   Arfeen, Arlyce Harman, MD  meloxicam (MOBIC) 15 MG tablet TAKE 1 TABLET BY MOUTH DAILY. 03/14/17   Boykin Nearing, MD  metFORMIN (GLUCOPHAGE) 500 MG tablet Take 1 tablet (500 mg total) by mouth daily with breakfast. 10/19/18   Ladell Pier, MD  montelukast (SINGULAIR) 10 MG tablet Take 1 tablet (10 mg total)  by mouth at bedtime. 08/22/16   Funches, Adriana Mccallum, MD  omeprazole (PRILOSEC) 20 MG capsule Take 1 capsule (20 mg total) by mouth 2 (two) times daily before a meal. 12/03/16   Funches, Josalyn, MD  pregabalin (LYRICA) 50 MG capsule TAKE 1 CAPSULE BY MOUTH THREE TIMES A DAY 11/17/18   Ladell Pier, MD  triamcinolone (NASACORT AQ) 55 MCG/ACT AERO nasal inhaler Place 2 sprays into the nose daily. 08/22/16   Boykin Nearing, MD    Family History Family History  Problem Relation Age of Onset  . Hypertension Mother   . Hypertension Other   . Schizophrenia Brother     Social History Social History   Tobacco Use  . Smoking status: Never Smoker  . Smokeless tobacco: Never Used  Substance Use Topics  . Alcohol use: Yes    Alcohol/week: 0.0 standard drinks    Comment: rare  . Drug use: No     Allergies   Milk-related compounds and Codeine sulfate   Review of Systems Review of Systems  Cardiovascular: Positive for chest pain.  All other systems reviewed and are negative.    Physical Exam Updated Vital Signs BP 134/84   Pulse 84   Temp 99 F (37.2 C) (Oral)   Resp 20   Ht 5\' 2"  (1.575 m)   Wt 114.1 kg   LMP 09/01/2013   SpO2 99%   BMI 46.00 kg/m   Physical Exam Vitals signs and nursing note reviewed.  Constitutional:      Appearance: She is well-developed.  HENT:     Head: Normocephalic and atraumatic.  Cardiovascular:     Rate and Rhythm: Normal rate and regular rhythm.     Heart sounds: No murmur.  Pulmonary:     Effort: Pulmonary effort is normal. No respiratory distress.     Breath sounds: Normal breath sounds.  Abdominal:     Palpations: Abdomen is soft.     Tenderness: There is no abdominal tenderness. There is no guarding or rebound.  Musculoskeletal:        General: No swelling or tenderness.  Skin:    General: Skin is warm and dry.  Neurological:     Mental Status: She is alert and oriented to person, place, and time.  Psychiatric:        Mood  and Affect: Mood normal.        Behavior: Behavior normal.      ED Treatments / Results  Labs (all labs ordered are listed, but only abnormal results are displayed) Labs Reviewed  BASIC METABOLIC PANEL - Abnormal; Notable for the following components:      Result Value   Glucose, Bld 127 (*)    All other components within normal limits  CBC - Abnormal; Notable for the following components:   Hemoglobin  11.7 (*)    RDW 15.8 (*)    All other components within normal limits  TROPONIN I  D-DIMER, QUANTITATIVE (NOT AT [facility])  I-STAT TROPONIN, ED  I-STAT BETA HCG BLOOD, ED ([facility], [facility], [facility] ONLY)  I-STAT BETA HCG BLOOD, ED (NOT ORDERABLE)  POCT I-STAT TROPONIN I    EKG EKG Interpretation  Date/Time:  Saturday November 21 2018 18:34:33 EST Ventricular Rate:  95 PR Interval:    QRS Duration: 143 QT Interval:  402 QTC Calculation: 506 R Axis:   -49 Text Interpretation:  Sinus rhythm Probable left atrial enlargement Left bundle branch block Baseline wander in lead(s) II V2 Confirmed by Quintella Reichert [phone]) on 11/21/2018 9:27:34 PM   Radiology Dg Chest 2 View  Result Date: 11/21/2018 CLINICAL DATA:  Chest pain EXAM: CHEST - 2 VIEW COMPARISON:  09/19/2015 FINDINGS: Heart and mediastinal contours are within normal limits. No focal opacities or effusions. No acute bony abnormality. IMPRESSION: No active cardiopulmonary disease. Electronically Signed   By: Rolm Baptise M.D.   On: 11/21/2018 19:31    Procedures Procedures (including critical care time)  Medications Ordered in ED Medications - No data to display   Initial Impression / Assessment and Plan / ED Course  I have reviewed the triage vital signs and the nursing notes.  Pertinent labs & imaging results that were available during my care of the patient were reviewed by me and considered in my medical decision making (see chart for details).    patient here for evaluation of one month of intermittent chest pain, no consistent  exertional component. EKG with left bundle branch block, similar when compared to priors. Troponin is negative times two and she has been asymptomatic today. Current presentation is not consistent with ACS, PE, dissection, pneumonia, CHF. Plan to refer to cardiology for close outpatient follow-up. Return precautions discussed.  Final Clinical Impressions(s) / ED Diagnoses   Final diagnoses:  Nonspecific chest pain    ED Discharge Orders         Ordered    Ambulatory referral to Cardiology     11/21/18 2335           Quintella Reichert, MD 11/22/18 [phone]

## 2018-11-21 NOTE — ED Triage Notes (Signed)
Pt reports having chest pain x 1 month. Pt reports that yesterday the chest pain felt worse.  Pt reports that she feels the pressure more and pt can feel it in her back and left arm. Pt did see her MD and was told that cardiology would contact her but they have yet to reach out. Pt rates pain a 4 out of 10.  Pt reports SOB but no visible sweat noted on pt. Pt a/o x 4.

## 2018-11-27 ENCOUNTER — Ambulatory Visit (INDEPENDENT_AMBULATORY_CARE_PROVIDER_SITE_OTHER): Payer: Medicare Other | Admitting: Psychology

## 2018-11-27 DIAGNOSIS — F3289 Other specified depressive episodes: Secondary | ICD-10-CM | POA: Diagnosis not present

## 2018-12-03 ENCOUNTER — Other Ambulatory Visit: Payer: Self-pay | Admitting: Neurosurgery

## 2018-12-11 ENCOUNTER — Ambulatory Visit (INDEPENDENT_AMBULATORY_CARE_PROVIDER_SITE_OTHER): Payer: Medicare Other | Admitting: Psychology

## 2018-12-11 DIAGNOSIS — F3289 Other specified depressive episodes: Secondary | ICD-10-CM | POA: Diagnosis not present

## 2018-12-16 ENCOUNTER — Ambulatory Visit (INDEPENDENT_AMBULATORY_CARE_PROVIDER_SITE_OTHER): Payer: Medicare Other | Admitting: Cardiovascular Disease

## 2018-12-16 ENCOUNTER — Encounter

## 2018-12-16 ENCOUNTER — Encounter: Payer: Self-pay | Admitting: Cardiovascular Disease

## 2018-12-16 VITALS — BP 130/88 | HR 86 | Ht 62.0 in | Wt 254.2 lb

## 2018-12-16 DIAGNOSIS — I447 Left bundle-branch block, unspecified: Secondary | ICD-10-CM | POA: Diagnosis not present

## 2018-12-16 DIAGNOSIS — I10 Essential (primary) hypertension: Secondary | ICD-10-CM | POA: Diagnosis not present

## 2018-12-16 DIAGNOSIS — Z8249 Family history of ischemic heart disease and other diseases of the circulatory system: Secondary | ICD-10-CM | POA: Diagnosis not present

## 2018-12-16 DIAGNOSIS — E782 Mixed hyperlipidemia: Secondary | ICD-10-CM

## 2018-12-16 DIAGNOSIS — Z01810 Encounter for preprocedural cardiovascular examination: Secondary | ICD-10-CM

## 2018-12-16 DIAGNOSIS — R079 Chest pain, unspecified: Secondary | ICD-10-CM | POA: Diagnosis not present

## 2018-12-16 DIAGNOSIS — E785 Hyperlipidemia, unspecified: Secondary | ICD-10-CM | POA: Insufficient documentation

## 2018-12-16 DIAGNOSIS — Z87898 Personal history of other specified conditions: Secondary | ICD-10-CM | POA: Insufficient documentation

## 2018-12-16 MED ORDER — METOPROLOL TARTRATE 100 MG PO TABS: 100.0000 mg | ORAL_TABLET | Freq: Once | ORAL | 0 refills | Status: DC

## 2018-12-16 NOTE — Assessment & Plan Note (Signed)
Michele Wood was recently seen in ER a month ago for chest pain.  She ruled out for myocardial infarction.  She does have risk factors included family history, treated hypertension, diabetes and hyperlipidemia.  Her pain began 6 months ago and occurs on a daily basis.  Can last up to 10 to 15 minutes at a time radiates to her jaw on occasion as well as associated shortness of breath.  Her body habitus is not conducive to Myoview stress testing given given her large breasts contribute to presentation artifact in her left bundle branch block.  She apparently scheduled for back surgery later this month.  I am going to get a coronary CTA to further evaluate and to re-stratify her as well as a 2D echo.  If her CTA shows significant CAD she will need diagnostic coronary angiography to further evaluate prior to her back surgery.

## 2018-12-16 NOTE — Assessment & Plan Note (Signed)
History of morbid obesity with BMI of 46

## 2018-12-16 NOTE — Assessment & Plan Note (Signed)
History of essential hypertension her blood pressure measured today 130/88.  He is on amlodipine lisinopril and hydrochlorothiazide.

## 2018-12-16 NOTE — Assessment & Plan Note (Signed)
New since her last EKG on record 10 years ago

## 2018-12-16 NOTE — Progress Notes (Signed)
12/16/2018 Michele Wood   May 19, 1961  [id number]  Primary Physician Ladell Pier, MD Primary Cardiologist: Lorretta Harp MD Lupe Carney, [place]  HPI:  Michele Wood is a 58 y.o. morbidly overweight married African-American female mother of 2, grandmother 5 grandchildren who is accompanied by a friend [place] today.  She was referred by Dr. Quintella Reichert at [facility] emergency room at [facility] because of a recent ER visit a month ago for chest pain.  Her primary care provider is Dr. Karle Plumber.  Her cardiac risk factors include treated hypertension, diabetes and hyperlipidemia.  Her brother did have a myocardial infarction and stent in the past.  She is never had a heart attack or stroke.  She apparently scheduled for back surgery later this month by Dr. Earle Gell.  She is had chest pain for 6 months and was seen in the emergency room 11/21/2018 for accelerated symptoms.  She ruled out for myocardial infarction.  She does have a bundle branch block.  She gets pain on a daily basis lasting 15 to 20 minutes at a time occasionally rating to her jaw and associated with shortness of breath.   Current Meds  Medication Sig  . amLODipine (NORVASC) 10 MG tablet Take 1 tablet (10 mg total) by mouth daily.  Marland Kitchen aspirin EC 81 MG tablet Take 1 tablet (81 mg total) by mouth daily.  Marland Kitchen atorvastatin (LIPITOR) 20 MG tablet Take 1 tablet (20 mg total) by mouth daily.  . Cholecalciferol (VITAMIN D3) 2000 UNITS TABS Take 2,000 Units by mouth daily.  . cyclobenzaprine (FLEXERIL) 10 MG tablet TAKE 1 TABLET BY MOUTH 3 TIMES DAILY AS NEEDED FOR MUSCLE SPASMS  . DULoxetine (CYMBALTA) 30 MG capsule Take 3 capsules (90 mg total) by mouth daily.  . hydrochlorothiazide (HYDRODIURIL) 25 MG tablet Take 1 tablet (25 mg total) by mouth daily.  Marland Kitchen lisinopril (PRINIVIL,ZESTRIL) 40 MG tablet Take 1 tablet (40 mg total) by mouth daily.  Marland Kitchen loratadine (CLARITIN) 10 MG tablet  Take 1 tablet (10 mg total) by mouth daily.  Marland Kitchen lurasidone (LATUDA) 40 MG TABS tablet Take 1 tablet (40 mg total) by mouth daily with breakfast.  . meloxicam (MOBIC) 15 MG tablet TAKE 1 TABLET BY MOUTH DAILY.  . metFORMIN (GLUCOPHAGE) 500 MG tablet Take 1 tablet (500 mg total) by mouth daily with breakfast.  . montelukast (SINGULAIR) 10 MG tablet Take 1 tablet (10 mg total) by mouth at bedtime.  Marland Kitchen omeprazole (PRILOSEC) 20 MG capsule Take 1 capsule (20 mg total) by mouth 2 (two) times daily before a meal.  . pregabalin (LYRICA) 50 MG capsule TAKE 1 CAPSULE BY MOUTH THREE TIMES A DAY  . triamcinolone (NASACORT AQ) 55 MCG/ACT AERO nasal inhaler Place 2 sprays into the nose daily.     Allergies  Allergen Reactions  . Milk-Related Compounds Diarrhea and Other (See Comments)    Diarrhea and stomach upset  . Codeine Sulfate Rash    Social History   Socioeconomic History  . Marital status: Married    Spouse name: Jenny Reichmann  . Number of children: 2  . Years of education: Not on file  . Highest education level: Some college, no degree  Occupational History  . Not on file  Social Needs  . Financial resource strain: Very hard  . Food insecurity:    Worry: Often true    Inability: Often true  . Transportation needs:    Medical: No  Non-medical: No  Tobacco Use  . Smoking status: Never Smoker  . Smokeless tobacco: Never Used  Substance and Sexual Activity  . Alcohol use: Yes    Alcohol/week: 0.0 standard drinks    Comment: rare  . Drug use: No  . Sexual activity: Not Currently  Lifestyle  . Physical activity:    Days per week: 7 days    Minutes per session: 60 min  . Stress: Very much  Relationships  . Social connections:    Talks on phone: Three times a week    Gets together: Not on file    Attends religious service: Not on file    Active member of club or organization: Not on file    Attends meetings of clubs or organizations: Not on file    Relationship status: Not on file    . Intimate partner violence:    Fear of current or ex partner: No    Emotionally abused: No    Physically abused: No    Forced sexual activity: No  Other Topics Concern  . Not on file  Social History Narrative  . Not on file     Review of Systems: General: negative for chills, fever, night sweats or weight changes.  Cardiovascular: negative for chest pain, dyspnea on exertion, edema, orthopnea, palpitations, paroxysmal nocturnal dyspnea or shortness of breath Dermatological: negative for rash Respiratory: negative for cough or wheezing Urologic: negative for hematuria Abdominal: negative for nausea, vomiting, diarrhea, bright red blood per rectum, melena, or hematemesis Neurologic: negative for visual changes, syncope, or dizziness All other systems reviewed and are otherwise negative except as noted above.    Blood pressure 130/88, pulse 86, height 5\' 2"  (1.575 m), weight 254 lb 3.2 oz (115.3 kg), last menstrual period 09/01/2013, SpO2 99 %.  General appearance: alert and no distress Neck: no adenopathy, no carotid bruit, no JVD, supple, symmetrical, trachea midline and thyroid not enlarged, symmetric, no tenderness/mass/nodules Lungs: clear to auscultation bilaterally Heart: regular rate and rhythm, S1, S2 normal, no murmur, click, rub or gallop Extremities: extremities normal, atraumatic, no cyanosis or edema Pulses: 2+ and symmetric Skin: Skin color, texture, turgor normal. No rashes or lesions Neurologic: Alert and oriented X 3, normal strength and tone. Normal symmetric reflexes. Normal coordination and gait  EKG not performed today  ASSESSMENT AND PLAN:   Obesity History of morbid obesity with BMI of 46  HTN (hypertension) History of essential hypertension her blood pressure measured today 130/88.  He is on amlodipine lisinopril and hydrochlorothiazide.  Hyperlipidemia History of hyperlipidemia on statin therapy with lipid profile performed 10/19/2018 revealing a  total cholesterol 210, LDL of 133 and HDL 35.  She does admit to dietary indiscretion.  Family history of heart disease And older brother has had stents  Left bundle branch block New since her last EKG on record 10 years ago  Chest pain Ms. Owensby was recently seen in ER a month ago for chest pain.  She ruled out for myocardial infarction.  She does have risk factors included family history, treated hypertension, diabetes and hyperlipidemia.  Her pain began 6 months ago and occurs on a daily basis.  Can last up to 10 to 15 minutes at a time radiates to her jaw on occasion as well as associated shortness of breath.  Her body habitus is not conducive to Myoview stress testing given given her large breasts contribute to presentation artifact in her left bundle branch block.  She apparently scheduled for back surgery later  this month.  I am going to get a coronary CTA to further evaluate and to re-stratify her as well as a 2D echo.  If her CTA shows significant CAD she will need diagnostic coronary angiography to further evaluate prior to her back surgery.      Lorretta Harp MD FACP,FACC,FAHA, [facility] 12/16/2018 10:05 AM

## 2018-12-16 NOTE — Patient Instructions (Addendum)
Please arrive at the [facility] main entrance of [facility] at xx:xx AM (30-45 minutes prior to test start time)  [facility] [place], [place] [address] [phone]  Proceed to the [facility] Radiology Department (First Floor).  Please follow these instructions carefully (unless otherwise directed):  Hold all erectile dysfunction medications at least 48 hours prior to test.  On the Night Before the Test: . Be sure to Drink plenty of water. . Do not consume any caffeinated/decaffeinated beverages or chocolate 12 hours prior to your test. . Do not take any antihistamines 12 hours prior to your test. . If you take Metformin do not take 24 hours prior to test. . If the patient has contrast allergy: ? Patient will need a prescription for Prednisone and very clear instructions (as follows): 1. Prednisone 50 mg - take 13 hours prior to test 2. Take another Prednisone 50 mg 7 hours prior to test 3. Take another Prednisone 50 mg 1 hour prior to test 4. Take Benadryl 50 mg 1 hour prior to test . Patient must complete all four doses of above prophylactic medications. . Patient will need a ride after test due to Benadryl.  On the Day of the Test: . Drink plenty of water. Do not drink any water within one hour of the test. . Do not eat any food 4 hours prior to the test. . You may take your regular medications prior to the test.  . Take metoprolol (Lopressor) 100 MG two hours prior to test. HOLD your LISINOPRIL on the day of your coronary CTA. Marland Kitchen HOLD Furosemide/Hydrochlorothiazide morning of the test.                 -IF HR is greater than 55 BPM and patient is less than or equal to 63 yrs old Lopressor 100mg  x1.                    After the Test: . Drink plenty of water. . After receiving IV contrast, you may experience a mild flushed feeling. This is normal. . On occasion, you may experience a mild rash up to 24 hours after the test. This is not  dangerous. If this occurs, you can take Benadryl 25 mg and increase your fluid intake. . If you experience trouble breathing, this can be serious. If it is severe call 911 IMMEDIATELY. If it is mild, please call our office. . If you take any of these medications: Glipizide/Metformin, Avandament, Glucavance, please do not take 48 hours after completing test.    Lab work: Your physician recommends that you return for lab work WITHIN 1-2 [place] CTA If you have labs (blood work) drawn today and your tests are completely normal, you will receive your results only by: Marland Kitchen MyChart Message (if you have MyChart) OR . A paper copy in the mail If you have any lab test that is abnormal or we need to change your treatment, we will call you to review the results.  Testing/Procedures: Your physician has requested that you have an echocardiogram. Echocardiography is a painless test that uses sound waves to create images of your heart. It provides your doctor with information about the size and shape of your heart and how well your heart's chambers and valves are working. This procedure takes approximately one hour. There are no restrictions for this procedure. [place], [place], [place] [address]   Follow-Up: At [facility], you and your health needs are our priority.  As part of our continuing mission to provide you with exceptional heart care, we have created designated Provider Care Teams.  These Care Teams include your primary Cardiologist (physician) and Advanced Practice Providers (APPs -  Physician Assistants and Nurse Practitioners) who all work together to provide you with the care you need, when you need it. You will need a follow up appointment in 3 months WITH DR. Gwenlyn Found. Dr. Gwenlyn Found will make a decision on clearance for your upcoming procedure based on the results of your coronary CTA and echocardiogram.     Any Other Special Instructions Will Be Listed Below (If  Applicable).  Heart-Healthy Eating Plan Heart-healthy meal planning includes:  Eating less unhealthy fats.  Eating more healthy fats.  Making other changes in your diet. Talk with your doctor or a diet specialist (dietitian) to create an eating plan that is right for you.  What are tips for following this plan? Cooking Avoid frying your food. Try to bake, boil, grill, or broil it instead. You can also reduce fat by:  Removing the skin from poultry.  Removing all visible fats from meats.  Steaming vegetables in water or broth. Meal planning   At meals, divide your plate into four equal parts: ? Fill one-half of your plate with vegetables and green salads. ? Fill one-fourth of your plate with whole grains. ? Fill one-fourth of your plate with lean protein foods.  Eat 4-5 servings of vegetables per day. A serving of vegetables is: ? 1 cup of raw or cooked vegetables. ? 2 cups of raw leafy greens.  Eat 4-5 servings of fruit per day. A serving of fruit is: ? 1 medium whole fruit. ?  cup of dried fruit. ?  cup of fresh, frozen, or canned fruit. ?  cup of 100% fruit juice.  Eat more foods that have soluble fiber. These are apples, broccoli, carrots, beans, peas, and barley. Try to get 20-30 g of fiber per day.  Eat 4-5 servings of nuts, legumes, and seeds per week: ? 1 serving of dried beans or legumes equals  cup after being cooked. ? 1 serving of nuts is  cup. ? 1 serving of seeds equals 1 tablespoon. General information  Eat more home-cooked food. Eat less restaurant, buffet, and fast food.  Limit or avoid alcohol.  Limit foods that are high in starch and sugar.  Avoid fried foods.  Lose weight if you are overweight.  Keep track of how much salt (sodium) you eat. This is important if you have high blood pressure. Ask your doctor to tell you more about this.  Try to add vegetarian meals each week. Fats  Choose healthy fats. These include olive oil and  canola oil, flaxseeds, walnuts, almonds, and seeds.  Eat more omega-3 fats. These include salmon, mackerel, sardines, tuna, flaxseed oil, and ground flaxseeds. Try to eat fish at least 2 times each week.  Check food labels. Avoid foods with trans fats or high amounts of saturated fat.  Limit saturated fats. ? These are often found in animal products, such as meats, butter, and cream. ? These are also found in plant foods, such as palm oil, palm kernel oil, and coconut oil.  Avoid foods with partially hydrogenated oils in them. These have trans fats. Examples are stick margarine, some tub margarines, cookies, crackers, and other baked goods. What foods can I eat? Fruits All fresh, canned (in natural juice), or frozen fruits. Vegetables  Fresh or frozen vegetables (raw, steamed, roasted, or grilled). Green salads. Grains Most grains. Choose whole wheat and whole grains most of the time. Rice and pasta, including brown rice and pastas made with whole wheat. Meats and other proteins Lean, well-trimmed beef, veal, pork, and lamb. Chicken and [place] without skin. All fish and shellfish. Wild duck, rabbit, pheasant, and venison. Egg whites or low-cholesterol egg substitutes. Dried beans, peas, lentils, and tofu. Seeds and most nuts. Dairy Low-fat or nonfat cheeses, including ricotta and mozzarella. Skim or 1% milk that is liquid, powdered, or evaporated. Buttermilk that is made with low-fat milk. Nonfat or low-fat yogurt. Fats and oils Non-hydrogenated (trans-free) margarines. Vegetable oils, including soybean, sesame, sunflower, olive, peanut, safflower, corn, canola, and cottonseed. Salad dressings or mayonnaise made with a vegetable oil. Beverages Mineral water. Coffee and tea. Diet carbonated beverages. Sweets and desserts Sherbet, gelatin, and fruit ice. Small amounts of dark chocolate. Limit all sweets and desserts. Seasonings and condiments All seasonings and condiments. The items  listed above may not be a complete list of foods and drinks you can eat. Contact a dietitian for more options. What foods should I avoid? Fruits Canned fruit in heavy syrup. Fruit in cream or butter sauce. Fried fruit. Limit coconut. Vegetables Vegetables cooked in cheese, cream, or butter sauce. Fried vegetables. Grains Breads that are made with saturated or trans fats, oils, or whole milk. Croissants. Sweet rolls. Donuts. High-fat crackers, such as cheese crackers. Meats and other proteins Fatty meats, such as hot dogs, ribs, sausage, bacon, rib-eye roast or steak. High-fat deli meats, such as salami and bologna. Caviar. Domestic duck and goose. Organ meats, such as liver. Dairy Cream, sour cream, cream cheese, and creamed cottage cheese. Whole-milk cheeses. Whole or 2% milk that is liquid, evaporated, or condensed. Whole buttermilk. Cream sauce or high-fat cheese sauce. Yogurt that is made from whole milk. Fats and oils Meat fat, or shortening. Cocoa butter, hydrogenated oils, palm oil, coconut oil, palm kernel oil. Solid fats and shortenings, including bacon fat, salt pork, lard, and butter. Nondairy cream substitutes. Salad dressings with cheese or sour cream. Beverages Regular sodas and juice drinks with added sugar. Sweets and desserts Frosting. Pudding. Cookies. Cakes. Pies. Milk chocolate or white chocolate. Buttered syrups. Full-fat ice cream or ice cream drinks. The items listed above may not be a complete list of foods and drinks to avoid. Contact a dietitian for more information. Summary  Heart-healthy meal planning includes eating less unhealthy fats, eating more healthy fats, and making other changes in your diet.  Eat a balanced diet. This includes fruits and vegetables, low-fat or nonfat dairy, lean protein, nuts and legumes, whole grains, and heart-healthy oils and fats. This information is not intended to replace advice given to you by your health care provider. Make sure  you discuss any questions you have with your health care provider.

## 2018-12-16 NOTE — Assessment & Plan Note (Signed)
History of hyperlipidemia on statin therapy with lipid profile performed 10/19/2018 revealing a total cholesterol 210, LDL of 133 and HDL 35.  She does admit to dietary indiscretion.

## 2018-12-16 NOTE — Assessment & Plan Note (Addendum)
And older brother has had stents

## 2018-12-23 ENCOUNTER — Ambulatory Visit (INDEPENDENT_AMBULATORY_CARE_PROVIDER_SITE_OTHER): Payer: Medicare Other | Admitting: Psychology

## 2018-12-23 DIAGNOSIS — F3289 Other specified depressive episodes: Secondary | ICD-10-CM

## 2018-12-28 ENCOUNTER — Ambulatory Visit (HOSPITAL_COMMUNITY): Payer: Medicare Other | Attending: Cardiology

## 2018-12-28 ENCOUNTER — Other Ambulatory Visit: Payer: Self-pay

## 2018-12-28 DIAGNOSIS — Z01810 Encounter for preprocedural cardiovascular examination: Secondary | ICD-10-CM | POA: Diagnosis not present

## 2018-12-28 DIAGNOSIS — R079 Chest pain, unspecified: Secondary | ICD-10-CM | POA: Diagnosis not present

## 2018-12-29 DIAGNOSIS — Z01810 Encounter for preprocedural cardiovascular examination: Secondary | ICD-10-CM | POA: Diagnosis not present

## 2018-12-29 DIAGNOSIS — R079 Chest pain, unspecified: Secondary | ICD-10-CM | POA: Diagnosis not present

## 2018-12-29 LAB — CBC
Hematocrit: 38 % (ref 34.0–46.6)
Hemoglobin: 12.3 g/dL (ref 11.1–15.9)
MCH: 26 pg — AB (ref 26.6–33.0)
MCHC: 32.4 g/dL (ref 31.5–35.7)
MCV: 80 fL (ref 79–97)
PLATELETS: 360 10*3/uL (ref 150–450)
RBC: 4.73 x10E6/uL (ref 3.77–5.28)
RDW: 14.5 % (ref 11.7–15.4)
WBC: 6.7 10*3/uL (ref 3.4–10.8)

## 2018-12-29 LAB — BASIC METABOLIC PANEL
BUN / CREAT RATIO: 14 (ref 9–23)
BUN: 12 mg/dL (ref 6–24)
CO2: 22 mmol/L (ref 20–29)
Calcium: 9.6 mg/dL (ref 8.7–10.2)
Chloride: 105 mmol/L (ref 96–106)
Creatinine, Ser: 0.84 mg/dL (ref 0.57–1.00)
GFR calc non Af Amer: 78 mL/min/{1.73_m2} (ref >59)
GFR, EST AFRICAN AMERICAN: 90 mL/min/{1.73_m2} (ref >59)
Glucose: 131 mg/dL — ABNORMAL HIGH (ref 65–99)
Potassium: 4.3 mmol/L (ref 3.5–5.2)
Sodium: 143 mmol/L (ref 134–144)

## 2018-12-31 ENCOUNTER — Telehealth: Payer: Self-pay

## 2019-01-01 ENCOUNTER — Inpatient Hospital Stay (HOSPITAL_COMMUNITY)
Admission: RE | Admit: 2019-01-01 | Discharge: 2019-01-01 | Disposition: A | Discharge status: Home / Self Care | Payer: Self-pay | Source: Ambulatory Visit

## 2019-01-06 ENCOUNTER — Ambulatory Visit (INDEPENDENT_AMBULATORY_CARE_PROVIDER_SITE_OTHER): Payer: Medicare Other | Admitting: Psychology

## 2019-01-06 DIAGNOSIS — F3289 Other specified depressive episodes: Secondary | ICD-10-CM | POA: Diagnosis not present

## 2019-01-08 ENCOUNTER — Telehealth: Payer: Self-pay

## 2019-01-08 DIAGNOSIS — E782 Mixed hyperlipidemia: Secondary | ICD-10-CM

## 2019-01-08 MED ORDER — ATORVASTATIN CALCIUM 40 MG PO TABS: 40.0000 mg | ORAL_TABLET | Freq: Every day | ORAL | 3 refills | Status: DC

## 2019-01-08 NOTE — Telephone Encounter (Signed)
Spoke with patient and informed of the following notes: Notes recorded by Lorretta Harp, MD on 12/29/2018 at 4:27 PM EDT  Not at goal on atorvastatin 20 mg a day with positive risk factors. Increase to 40 mg a day, emphasize heart healthy diet, and recheck in 3 months.  Pt agreeable with plan. Will mail lap slip and AHA heart healthy dietary recommendations to patient.

## 2019-01-08 NOTE — Addendum Note (Signed)
Addended by: Annita Brod on: 01/08/2019 04:13 PM   Modules accepted: Orders

## 2019-01-09 NOTE — Telephone Encounter (Signed)
Encounter closed

## 2019-01-18 ENCOUNTER — Ambulatory Visit (INDEPENDENT_AMBULATORY_CARE_PROVIDER_SITE_OTHER): Payer: Medicare Other | Admitting: Psychology

## 2019-01-18 ENCOUNTER — Ambulatory Visit: Payer: Medicare Other | Attending: Internal Medicine | Admitting: Internal Medicine

## 2019-01-18 ENCOUNTER — Other Ambulatory Visit: Payer: Self-pay

## 2019-01-18 DIAGNOSIS — I5189 Other ill-defined heart diseases: Secondary | ICD-10-CM

## 2019-01-18 DIAGNOSIS — M545 Low back pain, unspecified: Secondary | ICD-10-CM

## 2019-01-18 DIAGNOSIS — F3289 Other specified depressive episodes: Secondary | ICD-10-CM

## 2019-01-18 DIAGNOSIS — G8929 Other chronic pain: Secondary | ICD-10-CM | POA: Diagnosis not present

## 2019-01-18 DIAGNOSIS — E119 Type 2 diabetes mellitus without complications: Secondary | ICD-10-CM

## 2019-01-18 DIAGNOSIS — I10 Essential (primary) hypertension: Secondary | ICD-10-CM

## 2019-01-18 DIAGNOSIS — R079 Chest pain, unspecified: Secondary | ICD-10-CM

## 2019-01-18 DIAGNOSIS — M62838 Other muscle spasm: Secondary | ICD-10-CM

## 2019-01-18 MED ORDER — AMLODIPINE BESYLATE 10 MG PO TABS: 10.0000 mg | ORAL_TABLET | Freq: Every day | ORAL | 3 refills | Status: DC

## 2019-01-18 MED ORDER — HYDROCHLOROTHIAZIDE 25 MG PO TABS: 25.0000 mg | ORAL_TABLET | Freq: Every day | ORAL | 3 refills | Status: DC

## 2019-01-18 NOTE — Progress Notes (Signed)
Virtual Visit via Telephone Note  I connected with Michele Wood on 01/18/19 at 3:58 p.m by telephone from my office and verified that I am speaking with the correct person using two identifiers. Pt is at home.   I discussed the limitations, risks, security and privacy concerns of performing an evaluation and management service by telephone and the availability of in person appointments. I also discussed with the patient that there may be a patient responsible charge related to this service. The patient expressed understanding and agreed to proceed.   History of Present Illness: Hx of HTN, chronic sinusitis, degen disc of c-spine, chronic LBP, vit D def, obesity, anx/dep,  DM (with ? neuropathy in feet from DM vs lower back), dep with psychosis  CP: saw Dr. Gwenlyn Found last mth.  Echo revealed EF of 56-25% and diastolic parameters are consistent with impaired relaxation.  Septal dyssynergy with overall mildly reduced LV function and mild diastolic dysfunction.  Coronary CTA was ordered but patient has not received an appointment as yet.  Chronic LBP: is scheduled for back surgery on 01/10/2019 with Dr. Arnoldo Morale Requests refill on Flexeril to use as needed for spasms in neck and shoulders.   HTN:  Out of Lisinopril, HCTZ for 1-2 mths.  She states that the pharmacy did not give him to her when she got her other medicines refill.  Currently on Norvasc only. Metoprolol on med list but this is a one-time dose for her to take before coronary CTA that was ordered by Dr. Alvester Chou No device to check BP Limits salt in foods CP still come and good.  Little bit of SOB She stopped taking the Lyrica as discussed on last visit and swelling in legs resolved.   DM: No checking BS  "I got of sodas totally. I'm trying to just drink water." Eating more veggies and less white carbs Med: compliant with Metformin No device to wgh self at home.  Not getting in much exercise due to back  issue  Observations/Objective:  No direct observation was done as this is a telephone encounter.    Assessment and Plan:  1. Controlled type 2 diabetes mellitus without complication, without long-term current use of insulin (HCC) Continue metformin. Commended her on dietary changes.  2. Essential hypertension Patient advised that she has to inform the pharmacy when she needs refills on her medications otherwise they do not automatically refill them.  Refill sent to the pharmacy on amlodipine and hydrochlorothiazide.  I will hold off on restarting the lisinopril until she comes in to see the clinical pharmacist for repeat blood pressure check to see what her blood pressure looks like on amlodipine and hydrochlorothiazide.  Again the metoprolol is just a one-time dose that she is to take before coronary CTA is ordered by Dr. Alvester Chou - amLODipine (NORVASC) 10 MG tablet; Take 1 tablet (10 mg total) by mouth daily.  Dispense: 90 tablet; Refill: 3 - hydrochlorothiazide (HYDRODIURIL) 25 MG tablet; Take 1 tablet (25 mg total) by mouth daily.  Dispense: 90 tablet; Refill: 3  3. Muscle spasm Refill given on Flexeril to use as needed  4. Chronic bilateral low back pain without sciatica Advised her to hold off on having surgery on the lower back until cardiac work-up is completed  5.  Chest pain 6.  Diastolic dysfunction -Advised patient to call Dr. Naida Sleight office and let them know that she has not received appointment as yet for the coronary CTA that he has ordered.  There may be  a delay due to the current COVID-19 outbreak  Follow Up Instructions:    I discussed the assessment and treatment plan with the patient. The patient was provided an opportunity to ask questions and all were answered. The patient agreed with the plan and demonstrated an understanding of the instructions.   The patient was advised to call back or seek an in-person evaluation if the symptoms worsen or if the condition fails  to improve as anticipated.  I provided 18 minutes of non-face-to-face time during this encounter.   Karle Plumber, MD

## 2019-01-21 ENCOUNTER — Ambulatory Visit: Payer: Medicare Other | Attending: Family Medicine | Admitting: Pharmacist

## 2019-01-21 ENCOUNTER — Other Ambulatory Visit: Payer: Self-pay

## 2019-01-21 ENCOUNTER — Encounter: Payer: Self-pay | Admitting: Pharmacist

## 2019-01-21 VITALS — BP 131/87 | HR 82

## 2019-01-21 DIAGNOSIS — I10 Essential (primary) hypertension: Secondary | ICD-10-CM | POA: Insufficient documentation

## 2019-01-21 DIAGNOSIS — Z79899 Other long term (current) drug therapy: Secondary | ICD-10-CM | POA: Insufficient documentation

## 2019-01-21 DIAGNOSIS — Z8249 Family history of ischemic heart disease and other diseases of the circulatory system: Secondary | ICD-10-CM | POA: Insufficient documentation

## 2019-01-21 NOTE — Progress Notes (Signed)
   S:    PCP: Dr. Wynetta Emery  Patient arrives in good spirits. Presents to the clinic for hypertension management. Patient was referred by Dr. Wynetta Emery on 01/18/19. BP not taken - visit conducted as a Telemedicine visit. Dr. Wynetta Emery refilled amlodipine and HCTZ but held off on lisinopril as pt reported not taking.   Patient denies adherence with medications. Denies chest pain, dyspnea, HA or blurred vision.   Current BP Medications include:   - Amlodipine 10 mg daily - HCTZ 25 mg (not taking) - Patient is currently holding lisinopril  Dietary habits include:  - Limits salt; does not use when cooking; does not add at the table - Denies drinking caffeine  Exercise habits include: - Denies  Family / Social history:  - Fhx: HTN (mother), MI (brother) - Tobacco: never smoker - Alcohol: occasionally   Home BP readings: no meter at home  O:  L arm after 5 minutes rest: 131/87, HR 82 Last 3 Office BP readings: BP Readings from Last 3 Encounters:  01/21/19 131/87  12/16/18 130/88  11/21/18 134/84    BMET    Component Value Date/Time   NA 143 12/29/2018 1048   K 4.3 12/29/2018 1048   CL 105 12/29/2018 1048   CO2 22 12/29/2018 1048   GLUCOSE 131 (H) 12/29/2018 1048   GLUCOSE 127 (H) 11/21/2018 1902   BUN 12 12/29/2018 1048   CREATININE 0.84 12/29/2018 1048   CREATININE 0.85 04/25/2015 1602   CALCIUM 9.6 12/29/2018 1048   GFRNONAA 78 12/29/2018 1048   GFRNONAA 79 04/25/2015 1602   GFRAA 90 12/29/2018 1048   GFRAA >89 04/25/2015 1602    Renal function: CrCl cannot be calculated (Patient's most recent lab result is older than the maximum 21 days allowed.).  Clinical ASCVD: No  The 10-year ASCVD risk score Mikey Bussing DC Jr., et al., 2013) is: 19.9%   Values used to calculate the score:     Age: 58 years     Sex: Female     Is Non-Hispanic African American: Yes     Diabetic: Yes     Tobacco smoker: No     Systolic Blood Pressure: 185 mmHg     Is BP treated: Yes     HDL  Cholesterol: 35 mg/dL     Total Cholesterol: 210 mg/dL   A/P: Hypertension longstanding currently above goal on current medications. BP Goal <130/80 mmHg. Patient is not taking HCTZ but is adherent to amlodipine. Per PCP request, I have instructed patient to continue taking amlodipine and start back with HCTZ. She is to continue to hold off on lisinopril for now. She has upcoming appts with Cardiology and with her PCP over the summer.      -Continued amlodipine 10 mg daily. -Advised pt to restart HCTZ 25 mg daily. -Hold off on lisinopril for now.  -Counseled on lifestyle modifications for blood pressure control including reduced dietary sodium, increased exercise, adequate sleep -Encouraged regular follow-up appts with Dr. Gwenlyn Found and Dr. Wynetta Emery.  Results reviewed and written information provided. Total time in face-to-face counseling 15 minutes.   F/U Clinic Visit w. PCP.   Benard Halsted, PharmD, [place] [phone]

## 2019-01-21 NOTE — Patient Instructions (Addendum)
Thank you for coming to see Michele Wood today.   Blood pressure today is above goal.  Continue taking amlodipine and start taking HCTZ. We will continue to hold off on lisinopril for now.   Limiting salt and caffeine, as well as exercising as able for at least 30 minutes for 5 days out of the week, can also help you lower your blood pressure.  Take your blood pressure at home if you are able. Please write down these numbers and bring them to your visits.  If you have any questions about medications, please call me [phone].  [name]

## 2019-01-27 ENCOUNTER — Telehealth: Payer: Self-pay | Admitting: Cardiovascular Disease

## 2019-01-27 NOTE — Telephone Encounter (Signed)
Is pt cleared for procedure?

## 2019-01-27 NOTE — Telephone Encounter (Signed)
Ms. Kopplin had a 2D echo that showed mild LV dysfunction.  A coronary CTA was ordered but has yet to be done.  This was done for preoperative clearance for her back surgery scheduled sometime this month.  She was having fairly frequent chest pain that I was concerned about.  She cannot be cleared for her back surgery until she has her CTA.

## 2019-01-27 NOTE — Telephone Encounter (Signed)
New Message            Patient is calling back to check status on her testing, patient states she has surgery coming up in April and no one has gotten back to her in a month. Pls call to advise.

## 2019-01-29 NOTE — Telephone Encounter (Signed)
Pt aware that she cannot be cleared for her back surgery until she has her coronary CTA and that she would be contacted to schedule an appt for CTA when available. Pt verbalized understanding. Requests fax to Dr. Newman Pies office at [facility]. Telephone encounter faxed to office via Route-fax system in [place]. Advised pt to call EMS if she has MI symptoms. Pt verbalized understanding.

## 2019-02-02 ENCOUNTER — Ambulatory Visit (INDEPENDENT_AMBULATORY_CARE_PROVIDER_SITE_OTHER): Payer: Medicaid Other | Admitting: Psychology

## 2019-02-02 DIAGNOSIS — F3289 Other specified depressive episodes: Secondary | ICD-10-CM

## 2019-02-04 ENCOUNTER — Telehealth: Payer: Self-pay | Admitting: Cardiovascular Disease

## 2019-02-04 ENCOUNTER — Encounter: Payer: Self-pay | Admitting: Podiatry

## 2019-02-04 ENCOUNTER — Other Ambulatory Visit: Payer: Self-pay

## 2019-02-04 ENCOUNTER — Ambulatory Visit (INDEPENDENT_AMBULATORY_CARE_PROVIDER_SITE_OTHER): Payer: Medicare Other | Admitting: Podiatry

## 2019-02-04 VITALS — Temp 97.2°F

## 2019-02-04 DIAGNOSIS — L84 Corns and callosities: Secondary | ICD-10-CM

## 2019-02-04 DIAGNOSIS — E119 Type 2 diabetes mellitus without complications: Secondary | ICD-10-CM

## 2019-02-04 NOTE — Progress Notes (Signed)
Subjective: Michele Wood is a 58 y.o. y.o. female who presents today with cc of paitoenails and painful callus right foot which interfere with daily activities. Pain is aggravated when weightbearing with and without wearing enclosed shoe gear .  She states both of her heels feel much better since she received steroid injections for plantar fasciitis on her last visit. Her symptoms have not returned.  Ladell Pier, MD is her PCP and last visit was 10/19/2018.   Current Outpatient Medications:  .  amLODipine (NORVASC) 10 MG tablet, Take 1 tablet (10 mg total) by mouth daily., Disp: 90 tablet, Rfl: 3 .  aspirin EC 81 MG tablet, Take 1 tablet (81 mg total) by mouth daily., Disp: 100 tablet, Rfl: 1 .  atorvastatin (LIPITOR) 40 MG tablet, Take 1 tablet (40 mg total) by mouth daily., Disp: 90 tablet, Rfl: 3 .  Cholecalciferol (VITAMIN D3) 2000 UNITS TABS, Take 2,000 Units by mouth daily., Disp: 30 tablet, Rfl: 11 .  DULoxetine (CYMBALTA) 30 MG capsule, Take 3 capsules (90 mg total) by mouth daily. (Patient taking differently: Take 30 mg by mouth 3 (three) times daily. ), Disp: 90 capsule, Rfl: 2 .  hydrochlorothiazide (HYDRODIURIL) 25 MG tablet, Take 1 tablet (25 mg total) by mouth daily., Disp: 90 tablet, Rfl: 3 .  ibuprofen (ADVIL,MOTRIN) 200 MG tablet, Take 800 mg by mouth every 6 (six) hours as needed for moderate pain., Disp: , Rfl:  .  lisinopril (PRINIVIL,ZESTRIL) 40 MG tablet, Take 1 tablet (40 mg total) by mouth daily., Disp: 90 tablet, Rfl: 3 .  lurasidone (LATUDA) 40 MG TABS tablet, Take 1 tablet (40 mg total) by mouth daily with breakfast., Disp: 90 tablet, Rfl: 0 .  meloxicam (MOBIC) 15 MG tablet, TAKE 1 TABLET BY MOUTH DAILY. (Patient taking differently: Take 15 mg by mouth daily as needed for pain. ), Disp: 30 tablet, Rfl: 0 .  metFORMIN (GLUCOPHAGE) 500 MG tablet, Take 1 tablet (500 mg total) by mouth daily with breakfast., Disp: 90 tablet, Rfl: 3 .  montelukast (SINGULAIR)  10 MG tablet, Take 1 tablet (10 mg total) by mouth at bedtime., Disp: 30 tablet, Rfl: 3 .  triamcinolone (NASACORT AQ) 55 MCG/ACT AERO nasal inhaler, Place 2 sprays into the nose daily., Disp: 1 Inhaler, Rfl: 12 .  metoprolol tartrate (LOPRESSOR) 100 MG tablet, Take 1 tablet (100 mg total) by mouth once for 1 dose. TAKE 2 HOURS PRIOR TO YOUR CORONARY CTA. HOLD YOUR LISINOPRIL. (Patient not taking: Reported on 12/30/2018), Disp: 1 tablet, Rfl: 0  Allergies  Allergen Reactions  . Milk-Related Compounds Diarrhea and Other (See Comments)    Diarrhea and stomach upset  . Codeine Sulfate Rash    Objective:  Vascular Examination: Capillary refill time immediate x 10 digits.  Dorsalis pedis pulses palpable b/l.  Posterior tibial pulses palpable b/l.  Digital hair sparse x 10 digits.  Skin temperature gradient WNL b/l.  Dermatological Examination: Skin with normal turgor, texture and tone b/l.  Toenails 1-5 b/l well maintained b/l.  Hyperkeratotic lesion plantar hallux IPJ right foot with tenderness to palpation. No erythema, no edema, no drainage, no flocculence noted.   Musculoskeletal: Muscle strength 5/5 to all LE muscle groups  Neurological: Sensation intact with 10 gram monofilament.  Vibratory sensation intact.  Assessment: 1. Callus right hallux 2.  NIDDM  Plan: 1. Continue diabetic foot care principles. Literature dispensed on today. 2. Hyperkeratotic lesion(s) pared right hallux with sterile scalpel blade without incident. 3. Patient to continue soft,  supportive shoe gear daily. 4. Patient to report any pedal injuries to medical professional immediately. 5. Follow up 3 months.  6. Patient/POA to call should there be a concern in the interim. 

## 2019-02-04 NOTE — Patient Instructions (Signed)
Onychomycosis/Fungal Toenails  WHAT IS IT? An infection that lies within the keratin of your nail plate that is caused by a fungus.  WHY ME? Fungal infections affect all ages, sexes, races, and creeds.  There may be many factors that predispose you to a fungal infection such as age, coexisting medical conditions such as diabetes, or an autoimmune disease; stress, medications, fatigue, genetics, etc.  Bottom line: fungus thrives in a warm, moist environment and your shoes offer such a location.  IS IT CONTAGIOUS? Theoretically, yes.  You do not want to share shoes, nail clippers or files with someone who has fungal toenails.  Walking around barefoot in the same room or sleeping in the same bed is unlikely to transfer the organism.  It is important to realize, however, that fungus can spread easily from one nail to the next on the same foot.  HOW DO WE TREAT THIS?  There are several ways to treat this condition.  Treatment may depend on many factors such as age, medications, pregnancy, liver and kidney conditions, etc.  It is best to ask your doctor which options are available to you.  1. No treatment.   Unlike many other medical concerns, you can live with this condition.  However for many people this can be a painful condition and may lead to ingrown toenails or a bacterial infection.  It is recommended that you keep the nails cut short to help reduce the amount of fungal nail. 2. Topical treatment.  These range from herbal remedies to prescription strength nail lacquers.  About 40-50% effective, topicals require twice daily application for approximately 9 to 12 months or until an entirely new nail has grown out.  The most effective topicals are medical grade medications available through physicians offices. 3. Oral antifungal medications.  With an 80-90% cure rate, the most common oral medication requires 3 to 4 months of therapy and stays in your system for a year as the new nail grows out.  Oral  antifungal medications do require blood work to make sure it is a safe drug for you.  A liver function panel will be performed prior to starting the medication and after the first month of treatment.  It is important to have the blood work performed to avoid any harmful side effects.  In general, this medication safe but blood work is required. 4. Laser Therapy.  This treatment is performed by applying a specialized laser to the affected nail plate.  This therapy is noninvasive, fast, and non-painful.  It is not covered by insurance and is therefore, out of pocket.  The results have been very good with a 80-95% cure rate.  [facility] is the only practice in the area to offer this therapy. 5. Permanent Nail Avulsion.  Removing the entire nail so that a new nail will not grow back.  Corns and Calluses Corns are small areas of thickened skin that occur on the top, sides, or tip of a toe. They contain a cone-shaped core with a point that can press on a nerve below. This causes pain.  Calluses are areas of thickened skin that can occur anywhere on the body, including the hands, fingers, palms, soles of the feet, and heels. Calluses are usually larger than corns. What are the causes? Corns and calluses are caused by rubbing (friction) or pressure, such as from shoes that are too tight or do not fit properly. What increases the risk? Corns are more likely to develop in people   who have misshapen toes (toe deformities), such as hammer toes. Calluses can occur with friction to any area of the skin. They are more likely to develop in people who:  Work with their hands.  Wear shoes that fit poorly, are too tight, or are high-heeled.  Have toe deformities. What are the signs or symptoms? Symptoms of a corn or callus include:  A hard growth on the skin.  Pain or tenderness under the skin.  Redness and swelling.  Increased discomfort while wearing tight-fitting shoes, if your feet are  affected. If a corn or callus becomes infected, symptoms may include:  Redness and swelling that gets worse.  Pain.  Fluid, blood, or pus draining from the corn or callus. How is this diagnosed? Corns and calluses may be diagnosed based on your symptoms, your medical history, and a physical exam. How is this treated? Treatment for corns and calluses may include:  Removing the cause of the friction or pressure. This may involve: ? Changing your shoes. ? Wearing shoe inserts (orthotics) or other protective layers in your shoes, such as a corn pad. ? Wearing gloves.  Applying medicine to the skin (topical medicine) to help soften skin in the hardened, thickened areas.  Removing layers of dead skin with a file to reduce the size of the corn or callus.  Removing the corn or callus with a scalpel or laser.  Taking antibiotic medicines, if your corn or callus is infected.  Having surgery, if a toe deformity is the cause. Follow these instructions at home:   Take over-the-counter and prescription medicines only as told by your health care provider.  If you were prescribed an antibiotic, take it as told by your health care provider. Do not stop taking it even if your condition starts to improve.  Wear shoes that fit well. Avoid wearing high-heeled shoes and shoes that are too tight or too loose.  Wear any padding, protective layers, gloves, or orthotics as told by your health care provider.  Soak your hands or feet and then use a file or pumice stone to soften your corn or callus. Do this as told by your health care provider.  Check your corn or callus every day for symptoms of infection. Contact a health care provider if you:  Notice that your symptoms do not improve with treatment.  Have redness or swelling that gets worse.  Notice that your corn or callus becomes painful.  Have fluid, blood, or pus coming from your corn or callus.  Have new symptoms. Summary  Corns are  small areas of thickened skin that occur on the top, sides, or tip of a toe.  Calluses are areas of thickened skin that can occur anywhere on the body, including the hands, fingers, palms, and soles of the feet. Calluses are usually larger than corns.  Corns and calluses are caused by rubbing (friction) or pressure, such as from shoes that are too tight or do not fit properly.  Treatment may include wearing any padding, protective layers, gloves, or orthotics as told by your health care provider. This information is not intended to replace advice given to you by your health care provider. Make sure you discuss any questions you have with your health care provider. Document Released: 07/06/2004 Document Revised: 08/13/2017 Document Reviewed: 08/13/2017 Elsevier Interactive Patient Education  2019 Elsevier Inc.  Diabetes Mellitus and Foot Care Foot care is an important part of your health, especially when you have diabetes. Diabetes may cause you to have   problems because of poor blood flow (circulation) to your feet and legs, which can cause your skin to: Become thinner and drier. Break more easily. Heal more slowly. Peel and crack. You may also have nerve damage (neuropathy) in your legs and feet, causing decreased feeling in them. This means that you may not notice minor injuries to your feet that could lead to more serious problems. Noticing and addressing any potential problems early is the best way to prevent future foot problems. How to care for your feet Foot hygiene Wash your feet daily with warm water and mild soap. Do not use hot water. Then, pat your feet and the areas between your toes until they are completely dry. Do not soak your feet as this can dry your skin. Trim your toenails straight across. Do not dig under them or around the cuticle. File the edges of your nails with an emery board or nail file. Apply a moisturizing lotion or petroleum jelly to the skin on your feet and to  dry, brittle toenails. Use lotion that does not contain alcohol and is unscented. Do not apply lotion between your toes. Shoes and socks Wear clean socks or stockings every day. Make sure they are not too tight. Do not wear knee-high stockings since they may decrease blood flow to your legs. Wear shoes that fit properly and have enough cushioning. Always look in your shoes before you put them on to be sure there are no objects inside. To break in new shoes, wear them for just a few hours a day. This prevents injuries on your feet. Wounds, scrapes, corns, and calluses Check your feet daily for blisters, cuts, bruises, sores, and redness. If you cannot see the bottom of your feet, use a mirror or ask someone for help. Do not cut corns or calluses or try to remove them with medicine. If you find a minor scrape, cut, or break in the skin on your feet, keep it and the skin around it clean and dry. You may clean these areas with mild soap and water. Do not clean the area with peroxide, alcohol, or iodine. If you have a wound, scrape, corn, or callus on your foot, look at it several times a day to make sure it is healing and not infected. Check for: Redness, swelling, or pain. Fluid or blood. Warmth. Pus or a bad smell. General instructions Do not cross your legs. This may decrease blood flow to your feet. Do not use heating pads or hot water bottles on your feet. They may burn your skin. If you have lost feeling in your feet or legs, you may not know this is happening until it is too late. Protect your feet from hot and cold by wearing shoes, such as at the beach or on hot pavement. Schedule a complete foot exam at least once a year (annually) or more often if you have foot problems. If you have foot problems, report any cuts, sores, or bruises to your health care provider immediately. Contact a health care provider if: You have a medical condition that increases your risk of infection and you have any  cuts, sores, or bruises on your feet. You have an injury that is not healing. You have redness on your legs or feet. You feel burning or tingling in your legs or feet. You have pain or cramps in your legs and feet. Your legs or feet are numb. Your feet always feel cold. You have pain around a toenail. Get help   right away if: You have a wound, scrape, corn, or callus on your foot and: You have pain, swelling, or redness that gets worse. You have fluid or blood coming from the wound, scrape, corn, or callus. Your wound, scrape, corn, or callus feels warm to the touch. You have pus or a bad smell coming from the wound, scrape, corn, or callus. You have a fever. You have a red line going up your leg. Summary Check your feet every day for cuts, sores, red spots, swelling, and blisters. Moisturize feet and legs daily. Wear shoes that fit properly and have enough cushioning. If you have foot problems, report any cuts, sores, or bruises to your health care provider immediately. Schedule a complete foot exam at least once a year (annually) or more often if you have foot problems. This information is not intended to replace advice given to you by your health care provider. Make sure you discuss any questions you have with your health care provider. Document Released: 09/27/2000 Document Revised: 11/12/2017 Document Reviewed: 11/01/2016 Elsevier Interactive Patient Education  2019 Elsevier Inc.  

## 2019-02-04 NOTE — Telephone Encounter (Signed)
Lvm to call and schedule cardiac ct.

## 2019-02-05 ENCOUNTER — Encounter (HOSPITAL_COMMUNITY): Payer: Self-pay | Admitting: Psychiatry

## 2019-02-05 ENCOUNTER — Ambulatory Visit (INDEPENDENT_AMBULATORY_CARE_PROVIDER_SITE_OTHER): Payer: Medicare Other | Admitting: Psychiatry

## 2019-02-05 ENCOUNTER — Other Ambulatory Visit (HOSPITAL_COMMUNITY): Payer: Self-pay

## 2019-02-05 ENCOUNTER — Other Ambulatory Visit: Payer: Self-pay

## 2019-02-05 DIAGNOSIS — F331 Major depressive disorder, recurrent, moderate: Secondary | ICD-10-CM

## 2019-02-05 DIAGNOSIS — F4310 Post-traumatic stress disorder, unspecified: Secondary | ICD-10-CM

## 2019-02-05 MED ORDER — DULOXETINE HCL 30 MG PO CPEP: 90.0000 mg | ORAL_CAPSULE | Freq: Every day | ORAL | 1 refills | Status: DC

## 2019-02-05 MED ORDER — LURASIDONE HCL 60 MG PO TABS: 60.0000 mg | ORAL_TABLET | Freq: Every day | ORAL | 1 refills | Status: DC
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
## 2019-02-08 ENCOUNTER — Telehealth: Payer: Self-pay | Admitting: Cardiovascular Disease

## 2019-02-08 ENCOUNTER — Other Ambulatory Visit: Payer: Self-pay

## 2019-02-08 DIAGNOSIS — I209 Angina pectoris, unspecified: Secondary | ICD-10-CM

## 2019-02-08 NOTE — Progress Notes (Signed)
New order for pt upcoming cardiac CT with adjusted Dx code requested by Mack Guise now in epic

## 2019-02-08 NOTE — Telephone Encounter (Signed)
I have a hold spot for patient for 02/11/19 @ 8:30 for Cardiac  Ct.   Need new dx code for Ct. Can't schedule due to [facility] Dx.  Send me a message when you have fixed the order,    Thank.

## 2019-02-10 ENCOUNTER — Telehealth (HOSPITAL_COMMUNITY): Payer: Self-pay | Admitting: Emergency Medicine

## 2019-02-10 NOTE — Telephone Encounter (Signed)
Pt returning phone call regarding upcoming cardiac imaging study; pt verbalizes understanding of appt date/time, parking situation and where to check in, pre-test NPO status and medications ordered, and verified current allergies; name and call back number provided for further questions should they arise Marchia Bond RN [facility] [phone] office [phone] cell  Pt denies covid 19 symptoms currently Pt verbalized understanding of visitor policy

## 2019-02-10 NOTE — Telephone Encounter (Signed)
Left message on voicemail with name and callback number Marchia Bond RN Navigator [facility] [phone] Office [phone] Cell  Message sent via mychart

## 2019-02-11 ENCOUNTER — Encounter: Payer: Medicare Other | Admitting: *Deleted

## 2019-02-11 ENCOUNTER — Other Ambulatory Visit: Payer: Self-pay

## 2019-02-11 ENCOUNTER — Ambulatory Visit (HOSPITAL_COMMUNITY): Payer: Medicare Other

## 2019-02-11 ENCOUNTER — Ambulatory Visit (HOSPITAL_COMMUNITY)
Admission: RE | Admit: 2019-02-11 | Discharge: 2019-02-11 | Disposition: A | Discharge status: Home / Self Care | Payer: Medicare Other | Source: Ambulatory Visit | Attending: Cardiovascular Disease | Admitting: Cardiovascular Disease

## 2019-02-11 DIAGNOSIS — I209 Angina pectoris, unspecified: Secondary | ICD-10-CM | POA: Insufficient documentation

## 2019-02-11 DIAGNOSIS — Z006 Encounter for examination for normal comparison and control in clinical research program: Secondary | ICD-10-CM

## 2019-02-11 LAB — POCT I-STAT CREATININE: Creatinine, Ser: 0.9 mg/dL (ref 0.44–1.00)

## 2019-02-11 MED ORDER — METOPROLOL TARTRATE 5 MG/5ML IV SOLN
5.0000 mg | INTRAVENOUS | Status: DC | PRN
  Administered 2019-02-11: 5 mg via INTRAVENOUS
  Filled 2019-02-11: qty 5

## 2019-02-11 MED ORDER — IOHEXOL 350 MG/ML SOLN
100.0000 mL | Freq: Once | INTRAVENOUS | Status: AC | PRN
  Administered 2019-02-11: 09:00:00 100 mL via INTRAVENOUS

## 2019-02-11 MED ORDER — NITROGLYCERIN 0.4 MG SL SUBL
0.8000 mg | SUBLINGUAL_TABLET | Freq: Once | SUBLINGUAL | Status: AC
  Administered 2019-02-11: 0.8 mg via SUBLINGUAL
  Filled 2019-02-11: qty 25

## 2019-02-11 MED ORDER — NITROGLYCERIN 0.4 MG SL SUBL
SUBLINGUAL_TABLET | SUBLINGUAL | Status: AC
  Filled 2019-02-11: qty 2

## 2019-02-11 MED ORDER — METOPROLOL TARTRATE 5 MG/5ML IV SOLN
INTRAVENOUS | Status: AC
  Filled 2019-02-11: qty 20

## 2019-02-11 NOTE — Research (Signed)
Subject Name: Michele Wood  Subject met inclusion and exclusion criteria.  The informed consent form, study requirements and expectations were reviewed with the subject and questions and concerns were addressed prior to the signing of the consent form.  The subject verbalized understanding of the trial requirements.  The subject agreed to participate in the CADFEM trial and signed the informed consent at 02/11/19 on 0719.  The informed consent was obtained prior to performance of any protocol-specific procedures for the subject.  A copy of the signed informed consent was given to the subject and a copy was placed in the subject's medical record.   Star Age [place]

## 2019-02-11 NOTE — Progress Notes (Signed)
CT complete. Patient denies any complaints. Offered patient snack and drink.

## 2019-02-11 NOTE — Progress Notes (Signed)
Patient ambulatory out of department with steady gait noted. Denies any complaints. Patients son is driving patient home per patient.

## 2019-02-16 ENCOUNTER — Ambulatory Visit (INDEPENDENT_AMBULATORY_CARE_PROVIDER_SITE_OTHER): Payer: Medicare Other | Admitting: Psychology

## 2019-02-16 DIAGNOSIS — F3289 Other specified depressive episodes: Secondary | ICD-10-CM

## 2019-02-18 ENCOUNTER — Telehealth: Payer: Self-pay

## 2019-02-18 NOTE — Telephone Encounter (Signed)
   [facility] Pre-operative Risk Assessment    Request for surgical clearance:  1. What type of surgery is being performed? Posterior lumbar fusion 2 lvl   2. When is this surgery scheduled? 03/29/19  3. What type of clearance is required (medical clearance vs. Pharmacy clearance to hold med vs. Both)? Both  4. Are there any medications that need to be held prior to surgery and how long? Aspirin  5. Practice name and name of physician performing surgery? [facility]   Dr.Jeffrey Jenkins  6. What is your office phone number [phone]   7.   What is your office fax number [phone]  8.   Anesthesia type General   Kathyrn Lass 02/18/2019, 6:20 PM  _________________________________________________________________   (provider comments below)

## 2019-02-19 NOTE — Telephone Encounter (Signed)
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  She is cleared for her back surgery at low risk.  She can come off her aspirin.  After she recovers she will need a transesophageal echo to further evaluate her left atrial myxoma.

## 2019-02-19 NOTE — Telephone Encounter (Signed)
      Primary Cardiologist: Quay Burow, MD  Chart reviewed as part of pre-operative protocol coverage. Per Dr. Gwenlyn Found, patient is cleared for surgery at low risk. Aspirin can be held for surgery as needed.   I will route this recommendation to the requesting party via Epic fax function and remove from pre-op pool.  Please call with questions.  Abigail Butts, PA-C 02/19/2019, 4:28 PM

## 2019-02-25 NOTE — Telephone Encounter (Signed)
Cardiac ct completed on 4/30

## 2019-03-03 ENCOUNTER — Ambulatory Visit (INDEPENDENT_AMBULATORY_CARE_PROVIDER_SITE_OTHER): Payer: Medicare Other | Admitting: Psychology

## 2019-03-03 DIAGNOSIS — F3289 Other specified depressive episodes: Secondary | ICD-10-CM

## 2019-03-17 ENCOUNTER — Ambulatory Visit (INDEPENDENT_AMBULATORY_CARE_PROVIDER_SITE_OTHER): Payer: Medicaid Other | Admitting: Psychology

## 2019-03-17 DIAGNOSIS — F3289 Other specified depressive episodes: Secondary | ICD-10-CM

## 2019-03-19 ENCOUNTER — Ambulatory Visit (INDEPENDENT_AMBULATORY_CARE_PROVIDER_SITE_OTHER): Payer: Medicare Other | Admitting: Cardiovascular Disease

## 2019-03-19 ENCOUNTER — Other Ambulatory Visit: Payer: Self-pay

## 2019-03-19 ENCOUNTER — Telehealth: Payer: Self-pay

## 2019-03-19 DIAGNOSIS — D151 Benign neoplasm of heart: Secondary | ICD-10-CM

## 2019-03-19 DIAGNOSIS — R079 Chest pain, unspecified: Secondary | ICD-10-CM | POA: Diagnosis not present

## 2019-03-19 DIAGNOSIS — E782 Mixed hyperlipidemia: Secondary | ICD-10-CM | POA: Diagnosis not present

## 2019-03-19 DIAGNOSIS — I209 Angina pectoris, unspecified: Secondary | ICD-10-CM

## 2019-03-19 DIAGNOSIS — I447 Left bundle-branch block, unspecified: Secondary | ICD-10-CM | POA: Diagnosis not present

## 2019-03-19 DIAGNOSIS — I10 Essential (primary) hypertension: Secondary | ICD-10-CM | POA: Diagnosis not present

## 2019-03-19 NOTE — Telephone Encounter (Signed)
Cardiac clearance letter for pt upcoming procedure mailed to pt address on file on 6/5

## 2019-03-19 NOTE — Progress Notes (Signed)
03/19/2019 Michele Wood   [birth year]  [id number]  Primary Physician Ladell Pier, MD Primary Cardiologist: Lorretta Harp MD FACP, [place], [place], [place]  HPI:  Michele Wood is a 58 y.o.  morbidly overweight married African-American female mother of 2, grandmother 5 grandchildren who I last saw in the office 12/16/2018. She was referred by Dr. Quintella Reichert at [facility] emergency room at [facility] because of a recent ER visit a month ago for chest pain.  Her primary care provider is Dr. Karle Plumber.  Her cardiac risk factors include treated hypertension, diabetes and hyperlipidemia.  Her brother did have a myocardial infarction and stent in the past.  She is never had a heart attack or stroke.  She apparently scheduled for back surgery later this month by Dr. Earle Gell.  She is had chest pain for 6 months and was seen in the emergency room 11/21/2018 for accelerated symptoms.  She ruled out for myocardial infarction.  She does have a bundle branch block.  She was getting chest pain on a daily basis 10 to 20 minutes at a time but this is gotten much better since I last saw her.  A coronary CTA performed 02/11/2019 showed a low coronary calcium score 26 with minimal CAD.  A 2D echocardiogram performed 3/16//20 revealed normal LV systolic function with a left atrial mass consistent with myxoma.  She is scheduled for back surgery by Dr. Arnoldo Morale in the near future.  Current Meds  Medication Sig  . amLODipine (NORVASC) 10 MG tablet Take 1 tablet (10 mg total) by mouth daily.  Marland Kitchen aspirin EC 81 MG tablet Take 1 tablet (81 mg total) by mouth daily.  Marland Kitchen atorvastatin (LIPITOR) 40 MG tablet Take 1 tablet (40 mg total) by mouth daily.  . Cholecalciferol (VITAMIN D3) 2000 UNITS TABS Take 2,000 Units by mouth daily.  . DULoxetine (CYMBALTA) 30 MG capsule Take 3 capsules (90 mg total) by mouth daily.  . hydrochlorothiazide (HYDRODIURIL) 25 MG tablet Take 1 tablet (25 mg total) by  mouth daily.  Marland Kitchen ibuprofen (ADVIL,MOTRIN) 200 MG tablet Take 800 mg by mouth every 6 (six) hours as needed for moderate pain.  Marland Kitchen lisinopril (PRINIVIL,ZESTRIL) 40 MG tablet Take 1 tablet (40 mg total) by mouth daily.  . Lurasidone HCl 60 MG TABS Take 1 tablet (60 mg total) by mouth daily with breakfast.  . meloxicam (MOBIC) 15 MG tablet TAKE 1 TABLET BY MOUTH DAILY. (Patient taking differently: Take 15 mg by mouth daily as needed for pain. )  . metFORMIN (GLUCOPHAGE) 500 MG tablet Take 1 tablet (500 mg total) by mouth daily with breakfast.  . montelukast (SINGULAIR) 10 MG tablet Take 1 tablet (10 mg total) by mouth at bedtime.  . triamcinolone (NASACORT AQ) 55 MCG/ACT AERO nasal inhaler Place 2 sprays into the nose daily.     Allergies  Allergen Reactions  . Milk-Related Compounds Diarrhea and Other (See Comments)    Diarrhea and stomach upset  . Codeine Sulfate Rash    Social History   Socioeconomic History  . Marital status: Married    Spouse name: Jenny Reichmann  . Number of children: 2  . Years of education: Not on file  . Highest education level: Some college, no degree  Occupational History  . Not on file  Social Needs  . Financial resource strain: Very hard  . Food insecurity:    Worry: Often true    Inability: Often true  . Transportation  needs:    Medical: No    Non-medical: No  Tobacco Use  . Smoking status: Never Smoker  . Smokeless tobacco: Never Used  Substance and Sexual Activity  . Alcohol use: Yes    Alcohol/week: 0.0 standard drinks    Comment: rare  . Drug use: No  . Sexual activity: Not Currently  Lifestyle  . Physical activity:    Days per week: 7 days    Minutes per session: 60 min  . Stress: Very much  Relationships  . Social connections:    Talks on phone: Three times a week    Gets together: Not on file    Attends religious service: Not on file    Active member of club or organization: Not on file    Attends meetings of clubs or organizations: Not on  file    Relationship status: Not on file  . Intimate partner violence:    Fear of current or ex partner: No    Emotionally abused: No    Physically abused: No    Forced sexual activity: No  Other Topics Concern  . Not on file  Social History Narrative  . Not on file     Review of Systems: General: negative for chills, fever, night sweats or weight changes.  Cardiovascular: negative for chest pain, dyspnea on exertion, edema, orthopnea, palpitations, paroxysmal nocturnal dyspnea or shortness of breath Dermatological: negative for rash Respiratory: negative for cough or wheezing Urologic: negative for hematuria Abdominal: negative for nausea, vomiting, diarrhea, bright red blood per rectum, melena, or hematemesis Neurologic: negative for visual changes, syncope, or dizziness All other systems reviewed and are otherwise negative except as noted above.    Blood pressure 112/82, pulse 76, temperature (!) 97.3 F (36.3 C), height 5\' 2"  (1.575 m), weight 250 lb (113.4 kg), last menstrual period 09/01/2013.  General appearance: alert and no distress Neck: no adenopathy, no carotid bruit, no JVD, supple, symmetrical, trachea midline and thyroid not enlarged, symmetric, no tenderness/mass/nodules Lungs: clear to auscultation bilaterally Heart: regular rate and rhythm, S1, S2 normal, no murmur, click, rub or gallop Extremities: extremities normal, atraumatic, no cyanosis or edema Pulses: 2+ and symmetric Skin: Skin color, texture, turgor normal. No rashes or lesions Neurologic: Alert and oriented X 3, normal strength and tone. Normal symmetric reflexes. Normal coordination and gait  EKG not performed today  ASSESSMENT AND PLAN:   HTN (hypertension) History of essential hypertension blood pressure measured today at 112/82.  She is on amlodipine the hydrochlorothiazide lisinopril.  Hyperlipidemia History of hyperlipidemia on atorvastatin with lipid profile performed 10/19/2018 revealing  a total cholesterol 210, LDL 133 and HDL 35.  We will recheck a lipid liver profile  Left bundle branch block Chronic  Chest pain History of atypical chest pain with recent coronary CTA performed 02/11/2019 revealing a coronary calcium score of 26 minimal CAD.  This places her at low perioperative cardiovascular risk for her upcoming neurosurgery.  We will pursue aggressive risk factor modification.  Atrial myxoma Recent transthoracic echo performed 12/28/2018 revealed a left atrial mass consistent with eczema.  We will obtain a transesophageal echo to better characterize.      Lorretta Harp MD FACP,FACC,FAHA, [facility] 03/19/2019 10:38 AM

## 2019-03-19 NOTE — Assessment & Plan Note (Signed)
Recent transthoracic echo performed 12/28/2018 revealed a left atrial mass consistent with eczema.  We will obtain a transesophageal echo to better characterize.

## 2019-03-19 NOTE — Assessment & Plan Note (Signed)
Chronic. 

## 2019-03-19 NOTE — Assessment & Plan Note (Signed)
History of hyperlipidemia on atorvastatin with lipid profile performed 10/19/2018 revealing a total cholesterol 210, LDL 133 and HDL 35.  We will recheck a lipid liver profile

## 2019-03-19 NOTE — Telephone Encounter (Signed)
mychart message regarding clearance letter sent to pt

## 2019-03-19 NOTE — Patient Instructions (Addendum)
Dear Michele Wood, You are scheduled for a TEE on Monday May 17, 2019 with Dr. Cherlynn Kaiser.  Please arrive at the [facility] (Main Entrance A) at [facility]: [address] [place], [place] [address] at 8:30 am. (1 hour prior to procedure unless lab work is needed; if lab work is needed arrive 1.5 hours ahead)  DIET: Nothing to eat or drink after midnight except a sip of water with medications (see medication instructions below)  Medication Instructions:  Hold hydrochlorothiazide (Hydrouril) on the morning of your procedure   Labs For TEE:  Go to this location within 1 week of your procedure date:  [facility] at [facility] [address], [place], [place] [address] [place] and [place]. NO APPOINTMENT IS NEEDED. YOU MUST HAVE THIS LAB WORK DONE BEFORE GOING TO [place] COVID-19 TEST BECAUSE YOU WILL NEED TO QUARANTINE YOURSELF AFTER THE COVID-19 TEST UNTIL YOU RECEIVE YOUR RESULTS (NEGATIVE RESULTS).  Go to this location 3 days prior to your TEE for COVID-19 testing: [facility]  [address] [place], [place] [address] FOR YOUR COVID-19 TEST. YOUR APPOINTMENT IS ON May 14, 2019 AT 10:30AM. YOU WILL ALSO NEED TO QUARANTINE YOURSELF AFTER THE COVID-19 TEST UNTIL YOU RECEIVE YOUR RESULTS. RESULTS COME BACK IN 48 HOURS OR LESS.  You must have a responsible person to drive you home and stay in the waiting area during your procedure. Failure to do so could result in cancellation.  Bring your insurance cards.  *Special Note: Every effort is made to have your procedure done on time. Occasionally there are emergencies that occur at the hospital that may cause delays. Please be patient if a delay does occur.   Other Lab Work: Your physician recommends that you return for lab work in 1-2 weeks: [place] AND LIVER FUNCTION TEST.

## 2019-03-19 NOTE — Assessment & Plan Note (Signed)
History of atypical chest pain with recent coronary CTA performed 02/11/2019 revealing a coronary calcium score of 26 minimal CAD.  This places her at low perioperative cardiovascular risk for her upcoming neurosurgery.  We will pursue aggressive risk factor modification.

## 2019-03-19 NOTE — Assessment & Plan Note (Signed)
History of essential hypertension blood pressure measured today at 112/82.  She is on amlodipine the hydrochlorothiazide lisinopril.

## 2019-03-20 ENCOUNTER — Other Ambulatory Visit: Payer: Self-pay

## 2019-03-23 ENCOUNTER — Ambulatory Visit (INDEPENDENT_AMBULATORY_CARE_PROVIDER_SITE_OTHER): Payer: Medicare Other | Admitting: Psychiatry

## 2019-03-23 ENCOUNTER — Other Ambulatory Visit: Payer: Self-pay

## 2019-03-23 ENCOUNTER — Encounter (HOSPITAL_COMMUNITY): Payer: Self-pay | Admitting: Psychiatry

## 2019-03-23 DIAGNOSIS — F4310 Post-traumatic stress disorder, unspecified: Secondary | ICD-10-CM

## 2019-03-23 DIAGNOSIS — I209 Angina pectoris, unspecified: Secondary | ICD-10-CM | POA: Diagnosis not present

## 2019-03-23 DIAGNOSIS — F331 Major depressive disorder, recurrent, moderate: Secondary | ICD-10-CM

## 2019-03-23 MED ORDER — DULOXETINE HCL 60 MG PO CPEP: 60.0000 mg | ORAL_CAPSULE | Freq: Every day | ORAL | 1 refills | Status: DC

## 2019-03-23 MED ORDER — LURASIDONE HCL 60 MG PO TABS: 60.0000 mg | ORAL_TABLET | Freq: Every day | ORAL | 1 refills | Status: DC

## 2019-03-23 MED ORDER — HYDROXYZINE PAMOATE 25 MG PO CAPS: 25.0000 mg | ORAL_CAPSULE | Freq: Every evening | ORAL | 1 refills | Status: DC | PRN
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
## 2019-03-25 DIAGNOSIS — R079 Chest pain, unspecified: Secondary | ICD-10-CM | POA: Diagnosis not present

## 2019-03-25 DIAGNOSIS — Z01810 Encounter for preprocedural cardiovascular examination: Secondary | ICD-10-CM | POA: Diagnosis not present

## 2019-03-25 LAB — BASIC METABOLIC PANEL
BUN/Creatinine Ratio: 18 (ref 9–23)
BUN: 17 mg/dL (ref 6–24)
CO2: 24 mmol/L (ref 20–29)
Calcium: 9.3 mg/dL (ref 8.7–10.2)
Chloride: 102 mmol/L (ref 96–106)
Creatinine, Ser: 0.94 mg/dL (ref 0.57–1.00)
GFR calc Af Amer: 78 mL/min/{1.73_m2} (ref >59)
GFR calc non Af Amer: 68 mL/min/{1.73_m2} (ref >59)
Glucose: 110 mg/dL — ABNORMAL HIGH (ref 65–99)
Potassium: 3.6 mmol/L (ref 3.5–5.2)
Sodium: 143 mmol/L (ref 134–144)

## 2019-03-25 LAB — CBC
Hematocrit: 36.5 % (ref 34.0–46.6)
Hemoglobin: 12 g/dL (ref 11.1–15.9)
MCH: 26.2 pg — ABNORMAL LOW (ref 26.6–33.0)
MCHC: 32.9 g/dL (ref 31.5–35.7)
MCV: 80 fL (ref 79–97)
Platelets: 367 10*3/uL (ref 150–450)
RBC: 4.58 x10E6/uL (ref 3.77–5.28)
RDW: 12.8 % (ref 11.7–15.4)
WBC: 7.8 10*3/uL (ref 3.4–10.8)

## 2019-03-26 NOTE — Pre-Procedure Instructions (Signed)
[id number] [place], [place] [address] Phone: [phone] Fax: [phone]      Your procedure is scheduled on Wednesday 03-31-19  Report to [facility] Main Entrance "A" at [place].M., and check in at the Admitting office.  Call this number if you have problems the morning of surgery:  [phone]  Call [phone] if you have any questions prior to your surgery date Monday-Friday 8am-4pm    Remember:  Do not eat or drink after midnight.   Take these medicines the morning of surgery with A SIP OF WATER :  amLODipine (NORVASC) DULoxetine (CYMBALTA) atorvastatin (LIPITOR) lisinopril (PRINIVIL,ZESTRIL) triamcinolone (NASACORT AQ)as needed  Follow your surgeon's instructions on when to stop Aspirin.  If no instructions were given by your surgeon then you will need to call the office to get those instructions.    7 days prior to surgery STOP taking any MOBIC , Aspirin (unless otherwise instructed by your surgeon), Aleve, Naproxen, Ibuprofen, Motrin, Advil, Goody's, BC's, all herbal medications, fish oil, and all vitamins.    WHAT DO I DO ABOUT MY DIABETES MEDICATION?   Marland Kitchen Do not take oral diabetes medicines (pills) including METFORMIN  the morning of surgery.  How to Manage Your Diabetes Before and After Surgery  Why is it important to control my blood sugar before and after surgery? . Improving blood sugar levels before and after surgery helps healing and can limit problems. . A way of improving blood sugar control is eating a healthy diet by: o  Eating less sugar and carbohydrates o  Increasing activity/exercise o  Talking with your doctor about reaching your blood sugar goals . High blood sugars (greater than 180 mg/dL) can raise your risk of infections and slow your recovery, so you will need to focus on controlling your diabetes during the weeks before surgery. . Make  sure that the doctor who takes care of your diabetes knows about your planned surgery including the date and location.  How do I manage my blood sugar before surgery? . Check your blood sugar at least 4 times a day, starting 2 days before surgery, to make sure that the level is not too high or low. o Check your blood sugar the morning of your surgery when you wake up and every 2 hours until you get to the Short Stay unit. . If your blood sugar is less than 70 mg/dL, you will need to treat for low blood sugar: o Do not take insulin. o Treat a low blood sugar (less than 70 mg/dL) with  cup of clear juice (cranberry or apple), 4 glucose tablets, OR glucose gel. o Recheck blood sugar in 15 minutes after treatment (to make sure it is greater than 70 mg/dL). If your blood sugar is not greater than 70 mg/dL on recheck, call [phone] for further instructions. . Report your blood sugar to the short stay nurse when you get to Short Stay.  . If you are admitted to the hospital after surgery: o Your blood sugar will be checked by the staff and you will probably be given insulin after surgery (instead of oral diabetes medicines) to make sure you have good blood sugar levels. o The goal for blood sugar control after surgery is 80-180 mg/dL.  The Morning of Surgery  Do not wear jewelry, make-up or nail polish.  Do not wear lotions, powders, or perfumes/colognes, or deodorant  Do not shave  48 hours prior to surgery.  Men may shave face and neck.  Do not bring valuables to the hospital.  [facility] is not responsible for any belongings or valuables.  If you are a smoker, DO NOT Smoke 24 hours prior to surgery IF you wear a CPAP at night please bring your mask, tubing, and machine the morning of surgery   Remember that you must have someone to transport you home after your surgery, and remain with you for 24 hours if you are discharged the same day.   Contacts, glasses, hearing aids, dentures or  bridgework may not be worn into surgery.    Leave your suitcase in the car.  After surgery it may be brought to your room.  For patients admitted to the hospital, discharge time will be determined by your treatment team.  Patients discharged the day of surgery will not be allowed to drive home.    Special instructions:   [place]- Preparing For Surgery  Before surgery, you can play an important role. Because skin is not sterile, your skin needs to be as free of germs as possible. You can reduce the number of germs on your skin by washing with CHG (chlorahexidine gluconate) Soap before surgery.  CHG is an antiseptic cleaner which kills germs and bonds with the skin to continue killing germs even after washing.    Oral Hygiene is also important to reduce your risk of infection.  Remember - BRUSH YOUR TEETH THE MORNING OF SURGERY WITH YOUR REGULAR TOOTHPASTE  Please do not use if you have an allergy to CHG or antibacterial soaps. If your skin becomes reddened/irritated stop using the CHG.  Do not shave (including legs and underarms) for at least 48 hours prior to first CHG shower. It is OK to shave your face.  Please follow these instructions carefully.   1. Shower the NIGHT BEFORE SURGERY and the MORNING OF SURGERY with CHG Soap.   2. If you chose to wash your hair, wash your hair first as usual with your normal shampoo.  3. After you shampoo, rinse your hair and body thoroughly to remove the shampoo.  4. Use CHG as you would any other liquid soap. You can apply CHG directly to the skin and wash gently with a scrungie or a clean washcloth.   5. Apply the CHG Soap to your body ONLY FROM THE NECK DOWN.  Do not use on open wounds or open sores. Avoid contact with your eyes, ears, mouth and genitals (private parts). Wash Face and genitals (private parts)  with your normal soap.   6. Wash thoroughly, paying special attention to the area where your surgery will be  performed.  7. Thoroughly rinse your body with warm water from the neck down.  8. DO NOT shower/wash with your normal soap after using and rinsing off the CHG Soap.  9. Pat yourself dry with a CLEAN TOWEL.  10. Wear CLEAN PAJAMAS to bed the night before surgery, wear comfortable clothes the morning of surgery  11. Place CLEAN SHEETS on your bed the night of your first shower and DO NOT SLEEP WITH PETS.    Day of Surgery:  Do not apply any deodorants/lotions.  Please wear clean clothes to the hospital/surgery center.   Remember to brush your teeth WITH YOUR REGULAR TOOTHPASTE.   Please read over the following fact sheets that you were given.

## 2019-03-27 ENCOUNTER — Other Ambulatory Visit (HOSPITAL_COMMUNITY)
Admission: RE | Admit: 2019-03-27 | Discharge: 2019-03-27 | Disposition: A | Discharge status: Home / Self Care | Payer: Medicare Other | Source: Ambulatory Visit | Attending: Neurosurgery | Admitting: Neurosurgery

## 2019-03-27 DIAGNOSIS — Z1159 Encounter for screening for other viral diseases: Secondary | ICD-10-CM | POA: Diagnosis not present

## 2019-03-29 ENCOUNTER — Ambulatory Visit (INDEPENDENT_AMBULATORY_CARE_PROVIDER_SITE_OTHER): Payer: Medicare Other | Admitting: Psychology

## 2019-03-29 ENCOUNTER — Encounter (HOSPITAL_COMMUNITY): Payer: Self-pay

## 2019-03-29 ENCOUNTER — Other Ambulatory Visit: Payer: Self-pay

## 2019-03-29 ENCOUNTER — Inpatient Hospital Stay (HOSPITAL_COMMUNITY): Admission: RE | Admit: 2019-03-29 | Payer: Medicare Other | Source: Ambulatory Visit

## 2019-03-29 ENCOUNTER — Encounter (HOSPITAL_COMMUNITY)
Admission: RE | Admit: 2019-03-29 | Discharge: 2019-03-29 | Disposition: A | Discharge status: Home / Self Care | Payer: Medicare Other | Source: Ambulatory Visit | Attending: Neurosurgery | Admitting: Neurosurgery

## 2019-03-29 DIAGNOSIS — F3289 Other specified depressive episodes: Secondary | ICD-10-CM | POA: Diagnosis not present

## 2019-03-29 DIAGNOSIS — Z01812 Encounter for preprocedural laboratory examination: Secondary | ICD-10-CM | POA: Insufficient documentation

## 2019-03-29 LAB — GLUCOSE, CAPILLARY: Glucose-Capillary: 84 mg/dL (ref 70–99)

## 2019-03-29 LAB — NOVEL CORONAVIRUS, NAA (HOSP ORDER, SEND-OUT TO REF LAB; TAT 18-24 HRS): SARS-CoV-2, NAA: NOT DETECTED

## 2019-03-29 LAB — TYPE AND SCREEN
ABO/RH(D): B NEG
Antibody Screen: NEGATIVE

## 2019-03-29 LAB — SURGICAL PCR SCREEN
MRSA, PCR: NEGATIVE
Staphylococcus aureus: POSITIVE — AB

## 2019-03-29 LAB — ABO/RH: ABO/RH(D): B NEG

## 2019-03-29 LAB — HEMOGLOBIN A1C
Hgb A1c MFr Bld: 6.1 % — ABNORMAL HIGH (ref 4.8–5.6)
Mean Plasma Glucose: 128.37 mg/dL

## 2019-03-29 NOTE — Progress Notes (Signed)
  Coronavirus Screening  Pt tested for COVID on 03-27-19=Negative. Pt has been self quarantining at home. Have you experienced the following symptoms:  Cough yes/no: No Fever (>100.81F)  yes/no: No Runny nose yes/no: No Sore throat yes/no: No Difficulty breathing/shortness of breath  yes/no: No Loss of smell or taste:No Have you or a family member traveled in the last 14 days and where? yes/no: No  PCP - Karle Plumber  Cardiologist - Lorie Phenix. Did get cardiac clearance from Dr. Gwenlyn Found.  Psychologist- Berniece Andreas , Dr Debbe Bales  Chest x-ray - NA  EKG - 11-22-18 Pt denies any chest pain, palpitations, SOB.  Stress Test - denies ECHO - 12-28-18  Cardiac Cath - denies  AICD-denies PM-denies LOOP-denies  Sleep Study - Yes, more than 5 yrs back. Diagnosed with sleep apnea but did not use CPAP, was unable to afford it. CPAP -   LABS-A1C,T/S  ASA-Pt instructed to call Surgeon's office for further instructions on aspirin.  ERAS-denies  HA1C- 5.7 on 10-19-18 Fasting Blood Sugar - 84. Lowest 80's, highest 198 Checks Blood Sugar once in 6 months.  Anesthesia-LBB with NSR  on 11/22/18 ECG with h/o chest pain. Asymptomatic today.  Pt denies having chest pain,palpitaions, sob, or fever, bleeding etc at this time. All instructions explained to the pt, with a verbal understanding of the material. Pt agrees to go over the instructions while at home for a better understanding. The opportunity to ask questions was provided.

## 2019-03-30 NOTE — Anesthesia Preprocedure Evaluation (Addendum)
Anesthesia Evaluation  Patient identified by MRN, date of birth, ID band Patient awake    Reviewed: Allergy & Precautions, H&P , NPO status , Patient's Chart, lab work & pertinent test results  Airway Mallampati: II  TM Distance: >3 FB Neck ROM: full    Dental  (+) Teeth Intact, Dental Advisory Given,    Pulmonary shortness of breath, sleep apnea ,    breath sounds clear to auscultation       Cardiovascular hypertension, + angina  Rhythm:regular Rate:Normal  Pt followed by Dr Gwenlyn Found.  Has an atrial myxoma.  Atypical chest pain.   Neuro/Psych PSYCHIATRIC DISORDERS Anxiety Depression  Neuromuscular disease    GI/Hepatic   Endo/Other  diabetes, Type 2Morbid obesity  Renal/GU      Musculoskeletal  (+) Arthritis ,   Abdominal   Peds  Hematology   Anesthesia Other Findings   Reproductive/Obstetrics                           Anesthesia Physical Anesthesia Plan  ASA: II  Anesthesia Plan: General   Post-op Pain Management:    Induction: Intravenous  PONV Risk Score and Plan: 3 and Ondansetron, Dexamethasone, Midazolam and Treatment may vary due to age or medical condition  Airway Management Planned: Oral ETT  Additional Equipment:   Intra-op Plan:   Post-operative Plan: Extubation in OR  Informed Consent: I have reviewed the patients History and Physical, chart, labs and discussed the procedure including the risks, benefits and alternatives for the proposed anesthesia with the patient or authorized representative who has indicated his/her understanding and acceptance.       Plan Discussed with: CRNA, Anesthesiologist and Surgeon  Anesthesia Plan Comments: (Follows with cardiology for risk factor modification and monitoring of atrial myxoma. Seen by Dr. Gwenlyn Found 03/19/19, per OV note "History of atypical chest pain with recent coronary CTA performed 02/11/2019 revealing a coronary calcium  score of 26 minimal CAD.  This places her at low perioperative cardiovascular risk for her upcoming neurosurgery.  We will pursue aggressive risk factor modification."  TTE 12/28/18:  1. The left ventricle has mildly reduced systolic function, with an ejection fraction of 45-50%. The cavity size was normal. Left ventricular diastolic Doppler parameters are consistent with impaired relaxation.  2. The mitral valve is grossly normal. There is mild mitral annular calcification present.  3. The aortic valve is tricuspid Mild thickening of the aortic valve no stenosis of the aortic valve.  4. Septal dyssynergy with overall mildly reduced LV function; mild diastolic dysfunction.)       Anesthesia Quick Evaluation

## 2019-03-31 ENCOUNTER — Encounter (HOSPITAL_COMMUNITY)
Admission: RE | Disposition: A | Discharge status: Home / Self Care | Payer: Self-pay | Source: Home / Self Care | Attending: Neurosurgery

## 2019-03-31 ENCOUNTER — Inpatient Hospital Stay (HOSPITAL_COMMUNITY): Payer: Medicare Other

## 2019-03-31 ENCOUNTER — Inpatient Hospital Stay (HOSPITAL_COMMUNITY): Payer: Medicare Other | Admitting: Certified Registered Nurse Anesthetist

## 2019-03-31 ENCOUNTER — Encounter (HOSPITAL_COMMUNITY): Payer: Self-pay | Admitting: Certified Registered Nurse Anesthetist

## 2019-03-31 ENCOUNTER — Other Ambulatory Visit: Payer: Self-pay

## 2019-03-31 ENCOUNTER — Inpatient Hospital Stay (HOSPITAL_COMMUNITY)
Admission: RE | Admit: 2019-03-31 | Discharge: 2019-04-02 | DRG: 454 | Disposition: A | Discharge status: Home / Self Care | Payer: Medicare Other | Attending: Neurosurgery | Admitting: Neurosurgery

## 2019-03-31 ENCOUNTER — Inpatient Hospital Stay (HOSPITAL_COMMUNITY): Payer: Medicare Other | Admitting: Physician Assistant

## 2019-03-31 DIAGNOSIS — Z8249 Family history of ischemic heart disease and other diseases of the circulatory system: Secondary | ICD-10-CM

## 2019-03-31 DIAGNOSIS — M48062 Spinal stenosis, lumbar region with neurogenic claudication: Secondary | ICD-10-CM | POA: Diagnosis present

## 2019-03-31 DIAGNOSIS — M5116 Intervertebral disc disorders with radiculopathy, lumbar region: Secondary | ICD-10-CM | POA: Diagnosis present

## 2019-03-31 DIAGNOSIS — M4726 Other spondylosis with radiculopathy, lumbar region: Secondary | ICD-10-CM | POA: Diagnosis present

## 2019-03-31 DIAGNOSIS — Z79899 Other long term (current) drug therapy: Secondary | ICD-10-CM | POA: Diagnosis not present

## 2019-03-31 DIAGNOSIS — M4326 Fusion of spine, lumbar region: Secondary | ICD-10-CM | POA: Diagnosis not present

## 2019-03-31 DIAGNOSIS — E876 Hypokalemia: Secondary | ICD-10-CM | POA: Clinically undetermined

## 2019-03-31 DIAGNOSIS — Z6841 Body Mass Index (BMI) 40.0 and over, adult: Secondary | ICD-10-CM | POA: Diagnosis not present

## 2019-03-31 DIAGNOSIS — M4316 Spondylolisthesis, lumbar region: Secondary | ICD-10-CM | POA: Diagnosis present

## 2019-03-31 DIAGNOSIS — Z981 Arthrodesis status: Secondary | ICD-10-CM | POA: Diagnosis not present

## 2019-03-31 DIAGNOSIS — Z7982 Long term (current) use of aspirin: Secondary | ICD-10-CM | POA: Diagnosis not present

## 2019-03-31 DIAGNOSIS — E119 Type 2 diabetes mellitus without complications: Secondary | ICD-10-CM | POA: Diagnosis present

## 2019-03-31 DIAGNOSIS — F329 Major depressive disorder, single episode, unspecified: Secondary | ICD-10-CM | POA: Diagnosis present

## 2019-03-31 DIAGNOSIS — G4730 Sleep apnea, unspecified: Secondary | ICD-10-CM | POA: Diagnosis present

## 2019-03-31 DIAGNOSIS — Z7984 Long term (current) use of oral hypoglycemic drugs: Secondary | ICD-10-CM

## 2019-03-31 DIAGNOSIS — I10 Essential (primary) hypertension: Secondary | ICD-10-CM | POA: Diagnosis present

## 2019-03-31 DIAGNOSIS — Z90710 Acquired absence of both cervix and uterus: Secondary | ICD-10-CM

## 2019-03-31 DIAGNOSIS — M545 Low back pain: Secondary | ICD-10-CM | POA: Diagnosis not present

## 2019-03-31 DIAGNOSIS — Z419 Encounter for procedure for purposes other than remedying health state, unspecified: Secondary | ICD-10-CM

## 2019-03-31 LAB — GLUCOSE, CAPILLARY
Glucose-Capillary: 91 mg/dL (ref 70–99)
Glucose-Capillary: 103 mg/dL — ABNORMAL HIGH (ref 70–99)
Glucose-Capillary: 251 mg/dL — ABNORMAL HIGH (ref 70–99)

## 2019-03-31 SURGERY — POSTERIOR LUMBAR FUSION 2 LEVEL
Anesthesia: General | Site: Back

## 2019-03-31 MED ORDER — MIDAZOLAM HCL 5 MG/5ML IJ SOLN
INTRAMUSCULAR | Status: DC | PRN
  Administered 2019-03-31: 2 mg via INTRAVENOUS

## 2019-03-31 MED ORDER — ACETAMINOPHEN 650 MG RE SUPP: 650.0000 mg | RECTAL | Status: DC | PRN

## 2019-03-31 MED ORDER — AMLODIPINE BESYLATE 5 MG PO TABS
10.0000 mg | ORAL_TABLET | Freq: Every day | ORAL | Status: DC
  Administered 2019-04-01: 10 mg via ORAL
  Filled 2019-03-31: qty 2

## 2019-03-31 MED ORDER — INSULIN ASPART 100 UNIT/ML ~~LOC~~ SOLN
0.0000 [IU] | Freq: Three times a day (TID) | SUBCUTANEOUS | Status: DC
  Administered 2019-04-01: 18:00:00 3 [IU] via SUBCUTANEOUS

## 2019-03-31 MED ORDER — CEFAZOLIN SODIUM-DEXTROSE 2-4 GM/100ML-% IV SOLN
2.0000 g | INTRAVENOUS | Status: AC
  Administered 2019-03-31: 2 g via INTRAVENOUS

## 2019-03-31 MED ORDER — FENTANYL CITRATE (PF) 250 MCG/5ML IJ SOLN
INTRAMUSCULAR | Status: AC
  Filled 2019-03-31: qty 5

## 2019-03-31 MED ORDER — SODIUM CHLORIDE 0.9 % IV SOLN
INTRAVENOUS | Status: DC | PRN
  Administered 2019-03-31: 14:00:00 500 mL

## 2019-03-31 MED ORDER — SUGAMMADEX SODIUM 200 MG/2ML IV SOLN
INTRAVENOUS | Status: DC | PRN
  Administered 2019-03-31: 400 mg via INTRAVENOUS

## 2019-03-31 MED ORDER — PROPOFOL 10 MG/ML IV BOLUS
INTRAVENOUS | Status: DC | PRN
  Administered 2019-03-31: 150 mg via INTRAVENOUS
  Administered 2019-03-31: 50 mg via INTRAVENOUS

## 2019-03-31 MED ORDER — BUPIVACAINE-EPINEPHRINE (PF) 0.5% -1:200000 IJ SOLN
INTRAMUSCULAR | Status: AC
  Filled 2019-03-31: qty 30

## 2019-03-31 MED ORDER — LIDOCAINE 2% (20 MG/ML) 5 ML SYRINGE
INTRAMUSCULAR | Status: DC | PRN
  Administered 2019-03-31: 50 mg via INTRAVENOUS

## 2019-03-31 MED ORDER — OXYCODONE HCL 5 MG/5ML PO SOLN: 5.0000 mg | Freq: Once | ORAL | Status: AC | PRN

## 2019-03-31 MED ORDER — ACETAMINOPHEN 500 MG PO TABS
1000.0000 mg | ORAL_TABLET | Freq: Four times a day (QID) | ORAL | Status: AC
  Administered 2019-03-31 – 2019-04-01 (×3): 1000 mg via ORAL
  Filled 2019-03-31 (×3): qty 2

## 2019-03-31 MED ORDER — ROCURONIUM BROMIDE 10 MG/ML (PF) SYRINGE
PREFILLED_SYRINGE | INTRAVENOUS | Status: AC
  Filled 2019-03-31: qty 10

## 2019-03-31 MED ORDER — MONTELUKAST SODIUM 10 MG PO TABS
10.0000 mg | ORAL_TABLET | Freq: Every day | ORAL | Status: DC
  Administered 2019-04-01: 10 mg via ORAL
  Filled 2019-03-31 (×2): qty 1

## 2019-03-31 MED ORDER — BUPIVACAINE-EPINEPHRINE (PF) 0.5% -1:200000 IJ SOLN
INTRAMUSCULAR | Status: DC | PRN
  Administered 2019-03-31: 10 mL

## 2019-03-31 MED ORDER — ROCURONIUM BROMIDE 10 MG/ML (PF) SYRINGE
PREFILLED_SYRINGE | INTRAVENOUS | Status: DC | PRN
  Administered 2019-03-31: 50 mg via INTRAVENOUS
  Administered 2019-03-31: 20 mg via INTRAVENOUS
  Administered 2019-03-31: 10 mg via INTRAVENOUS

## 2019-03-31 MED ORDER — PHENYLEPHRINE 40 MCG/ML (10ML) SYRINGE FOR IV PUSH (FOR BLOOD PRESSURE SUPPORT)
PREFILLED_SYRINGE | INTRAVENOUS | Status: DC | PRN
  Administered 2019-03-31 (×9): 40 ug via INTRAVENOUS

## 2019-03-31 MED ORDER — ZOLPIDEM TARTRATE 5 MG PO TABS: 5.0000 mg | ORAL_TABLET | Freq: Every evening | ORAL | Status: DC | PRN

## 2019-03-31 MED ORDER — CYCLOBENZAPRINE HCL 10 MG PO TABS
10.0000 mg | ORAL_TABLET | Freq: Three times a day (TID) | ORAL | Status: DC | PRN
  Administered 2019-03-31: 10 mg via ORAL
  Filled 2019-03-31: qty 1

## 2019-03-31 MED ORDER — OXYCODONE HCL 5 MG PO TABS
5.0000 mg | ORAL_TABLET | Freq: Once | ORAL | Status: AC | PRN
  Administered 2019-03-31: 5 mg via ORAL

## 2019-03-31 MED ORDER — HYDROXYZINE PAMOATE 25 MG PO CAPS: 25.0000 mg | ORAL_CAPSULE | Freq: Every evening | ORAL | Status: DC | PRN

## 2019-03-31 MED ORDER — SODIUM CHLORIDE 0.9 % IV SOLN: 250.0000 mL | INTRAVENOUS | Status: DC

## 2019-03-31 MED ORDER — INSULIN ASPART 100 UNIT/ML ~~LOC~~ SOLN: 0.0000 [IU] | Freq: Three times a day (TID) | SUBCUTANEOUS | Status: DC

## 2019-03-31 MED ORDER — DOCUSATE SODIUM 100 MG PO CAPS
100.0000 mg | ORAL_CAPSULE | Freq: Two times a day (BID) | ORAL | Status: DC
  Administered 2019-03-31 – 2019-04-01 (×3): 100 mg via ORAL
  Filled 2019-03-31 (×3): qty 1

## 2019-03-31 MED ORDER — THROMBIN 5000 UNITS EX SOLR
OROMUCOSAL | Status: DC | PRN
  Administered 2019-03-31: 5 mL via TOPICAL
  Administered 2019-03-31: 5 mL

## 2019-03-31 MED ORDER — ONDANSETRON HCL 4 MG PO TABS: 4.0000 mg | ORAL_TABLET | Freq: Four times a day (QID) | ORAL | Status: DC | PRN

## 2019-03-31 MED ORDER — VITAMIN D3 50 MCG (2000 UT) PO TABS: 2000.0000 [IU] | ORAL_TABLET | Freq: Every day | ORAL | Status: DC

## 2019-03-31 MED ORDER — ARTIFICIAL TEARS OPHTHALMIC OINT
TOPICAL_OINTMENT | OPHTHALMIC | Status: AC
  Filled 2019-03-31: qty 3.5

## 2019-03-31 MED ORDER — ONDANSETRON HCL 4 MG/2ML IJ SOLN
INTRAMUSCULAR | Status: DC | PRN
  Administered 2019-03-31: 4 mg via INTRAVENOUS

## 2019-03-31 MED ORDER — ARTIFICIAL TEARS OPHTHALMIC OINT
TOPICAL_OINTMENT | OPHTHALMIC | Status: DC | PRN
  Administered 2019-03-31: 1 via OPHTHALMIC

## 2019-03-31 MED ORDER — OXYCODONE HCL 5 MG PO TABS
10.0000 mg | ORAL_TABLET | ORAL | Status: DC | PRN
  Administered 2019-04-01 – 2019-04-02 (×8): 10 mg via ORAL
  Filled 2019-03-31 (×8): qty 2

## 2019-03-31 MED ORDER — MIDAZOLAM HCL 2 MG/2ML IJ SOLN
INTRAMUSCULAR | Status: AC
  Filled 2019-03-31: qty 2

## 2019-03-31 MED ORDER — OXYCODONE HCL 5 MG PO TABS
5.0000 mg | ORAL_TABLET | ORAL | Status: DC | PRN
  Administered 2019-03-31 – 2019-04-02 (×2): 5 mg via ORAL
  Filled 2019-03-31 (×2): qty 1

## 2019-03-31 MED ORDER — 0.9 % SODIUM CHLORIDE (POUR BTL) OPTIME
TOPICAL | Status: DC | PRN
  Administered 2019-03-31: 1000 mL

## 2019-03-31 MED ORDER — ACETAMINOPHEN 325 MG PO TABS: 650.0000 mg | ORAL_TABLET | ORAL | Status: DC | PRN

## 2019-03-31 MED ORDER — DULOXETINE HCL 30 MG PO CPEP
60.0000 mg | ORAL_CAPSULE | Freq: Every day | ORAL | Status: DC
  Administered 2019-04-01: 60 mg via ORAL
  Filled 2019-03-31: qty 2

## 2019-03-31 MED ORDER — BUPIVACAINE LIPOSOME 1.3 % IJ SUSP
20.0000 mL | INTRAMUSCULAR | Status: AC
  Administered 2019-03-31: 20 mL
  Filled 2019-03-31: qty 20

## 2019-03-31 MED ORDER — PROPOFOL 10 MG/ML IV BOLUS
INTRAVENOUS | Status: AC
  Filled 2019-03-31: qty 20

## 2019-03-31 MED ORDER — BACITRACIN ZINC 500 UNIT/GM EX OINT
TOPICAL_OINTMENT | CUTANEOUS | Status: AC
  Filled 2019-03-31: qty 28.35

## 2019-03-31 MED ORDER — LACTATED RINGERS IV SOLN
INTRAVENOUS | Status: DC
  Administered 2019-03-31: 10:00:00 via INTRAVENOUS

## 2019-03-31 MED ORDER — CEFAZOLIN SODIUM-DEXTROSE 2-4 GM/100ML-% IV SOLN
INTRAVENOUS | Status: AC
  Filled 2019-03-31: qty 100

## 2019-03-31 MED ORDER — ATORVASTATIN CALCIUM 40 MG PO TABS
40.0000 mg | ORAL_TABLET | Freq: Every day | ORAL | Status: DC
  Administered 2019-04-01: 40 mg via ORAL
  Filled 2019-03-31: qty 1

## 2019-03-31 MED ORDER — INSULIN ASPART 100 UNIT/ML ~~LOC~~ SOLN: 0.0000 [IU] | SUBCUTANEOUS | Status: DC

## 2019-03-31 MED ORDER — DEXAMETHASONE SODIUM PHOSPHATE 10 MG/ML IJ SOLN
INTRAMUSCULAR | Status: AC
  Filled 2019-03-31: qty 1

## 2019-03-31 MED ORDER — SODIUM CHLORIDE 0.9% FLUSH: 3.0000 mL | Freq: Two times a day (BID) | INTRAVENOUS | Status: DC

## 2019-03-31 MED ORDER — FENTANYL CITRATE (PF) 100 MCG/2ML IJ SOLN
25.0000 ug | INTRAMUSCULAR | Status: DC | PRN
  Administered 2019-03-31: 25 ug via INTRAVENOUS

## 2019-03-31 MED ORDER — METFORMIN HCL 500 MG PO TABS
500.0000 mg | ORAL_TABLET | Freq: Every day | ORAL | Status: DC
  Administered 2019-04-01 – 2019-04-02 (×2): 500 mg via ORAL
  Filled 2019-03-31 (×2): qty 1

## 2019-03-31 MED ORDER — PHENYLEPHRINE 40 MCG/ML (10ML) SYRINGE FOR IV PUSH (FOR BLOOD PRESSURE SUPPORT)
PREFILLED_SYRINGE | INTRAVENOUS | Status: AC
  Filled 2019-03-31: qty 10

## 2019-03-31 MED ORDER — BACITRACIN ZINC 500 UNIT/GM EX OINT
TOPICAL_OINTMENT | CUTANEOUS | Status: DC | PRN
  Administered 2019-03-31: 1 via TOPICAL

## 2019-03-31 MED ORDER — MENTHOL 3 MG MT LOZG: 1.0000 | LOZENGE | OROMUCOSAL | Status: DC | PRN

## 2019-03-31 MED ORDER — FENTANYL CITRATE (PF) 250 MCG/5ML IJ SOLN
INTRAMUSCULAR | Status: DC | PRN
  Administered 2019-03-31: 50 ug via INTRAVENOUS
  Administered 2019-03-31: 125 ug via INTRAVENOUS
  Administered 2019-03-31 (×2): 50 ug via INTRAVENOUS
  Administered 2019-03-31: 75 ug via INTRAVENOUS

## 2019-03-31 MED ORDER — SODIUM CHLORIDE 0.9% FLUSH: 3.0000 mL | INTRAVENOUS | Status: DC | PRN

## 2019-03-31 MED ORDER — THROMBIN 20000 UNITS EX SOLR
CUTANEOUS | Status: AC
  Filled 2019-03-31: qty 20000

## 2019-03-31 MED ORDER — DEXAMETHASONE SODIUM PHOSPHATE 10 MG/ML IJ SOLN
INTRAMUSCULAR | Status: DC | PRN
  Administered 2019-03-31: 10 mg via INTRAVENOUS

## 2019-03-31 MED ORDER — HYDROCHLOROTHIAZIDE 25 MG PO TABS
25.0000 mg | ORAL_TABLET | Freq: Every day | ORAL | Status: DC
  Administered 2019-04-01: 25 mg via ORAL
  Filled 2019-03-31: qty 1

## 2019-03-31 MED ORDER — ONDANSETRON HCL 4 MG/2ML IJ SOLN
INTRAMUSCULAR | Status: AC
  Filled 2019-03-31: qty 4

## 2019-03-31 MED ORDER — MORPHINE SULFATE (PF) 4 MG/ML IV SOLN: 4.0000 mg | INTRAVENOUS | Status: DC | PRN

## 2019-03-31 MED ORDER — CHLORHEXIDINE GLUCONATE CLOTH 2 % EX PADS: 6.0000 | MEDICATED_PAD | Freq: Once | CUTANEOUS | Status: DC

## 2019-03-31 MED ORDER — FENTANYL CITRATE (PF) 100 MCG/2ML IJ SOLN
INTRAMUSCULAR | Status: AC
  Filled 2019-03-31: qty 2

## 2019-03-31 MED ORDER — LURASIDONE HCL 20 MG PO TABS
60.0000 mg | ORAL_TABLET | Freq: Every day | ORAL | Status: DC
  Administered 2019-04-01 – 2019-04-02 (×2): 60 mg via ORAL
  Filled 2019-03-31 (×2): qty 3

## 2019-03-31 MED ORDER — ONDANSETRON HCL 4 MG/2ML IJ SOLN: 4.0000 mg | Freq: Four times a day (QID) | INTRAMUSCULAR | Status: DC | PRN

## 2019-03-31 MED ORDER — INSULIN ASPART 100 UNIT/ML ~~LOC~~ SOLN
0.0000 [IU] | Freq: Every day | SUBCUTANEOUS | Status: DC
  Administered 2019-03-31: 3 [IU] via SUBCUTANEOUS

## 2019-03-31 MED ORDER — THROMBIN 5000 UNITS EX SOLR
CUTANEOUS | Status: AC
  Filled 2019-03-31: qty 10000

## 2019-03-31 MED ORDER — LIDOCAINE 2% (20 MG/ML) 5 ML SYRINGE
INTRAMUSCULAR | Status: AC
  Filled 2019-03-31: qty 10

## 2019-03-31 MED ORDER — BISACODYL 10 MG RE SUPP: 10.0000 mg | Freq: Every day | RECTAL | Status: DC | PRN

## 2019-03-31 MED ORDER — TRIAMCINOLONE ACETONIDE 55 MCG/ACT NA AERO: 2.0000 | INHALATION_SPRAY | Freq: Every day | NASAL | Status: DC

## 2019-03-31 MED ORDER — PHENOL 1.4 % MT LIQD: 1.0000 | OROMUCOSAL | Status: DC | PRN

## 2019-03-31 MED ORDER — OXYCODONE HCL 5 MG PO TABS
ORAL_TABLET | ORAL | Status: AC
  Filled 2019-03-31: qty 1

## 2019-03-31 MED ORDER — CEFAZOLIN SODIUM-DEXTROSE 2-4 GM/100ML-% IV SOLN
2.0000 g | Freq: Three times a day (TID) | INTRAVENOUS | Status: AC
  Administered 2019-03-31 – 2019-04-01 (×2): 2 g via INTRAVENOUS
  Filled 2019-03-31 (×2): qty 100

## 2019-03-31 MED ORDER — LACTATED RINGERS IV SOLN
INTRAVENOUS | Status: DC | PRN
  Administered 2019-03-31 (×3): via INTRAVENOUS

## 2019-03-31 SURGICAL SUPPLY — 72 items
BAG DECANTER FOR FLEXI CONT (MISCELLANEOUS) ×3 IMPLANT
BENZOIN TINCTURE PRP APPL 2/3 (GAUZE/BANDAGES/DRESSINGS) ×3 IMPLANT
BLADE CLIPPER SURG (BLADE) IMPLANT
BUR MATCHSTICK NEURO 3.0 LAGG (BURR) ×3 IMPLANT
BUR PRECISION FLUTE 6.0 (BURR) ×3 IMPLANT
CAGE ALTERA 10X31MM-10-14-15 (Cage) ×2 IMPLANT
CAGE ALTERA 10X31X10-14 15D (Cage) ×4 IMPLANT
CAGE ALTERA 10X31X9-13 15D (Cage) IMPLANT
CAGE ALTERA 9-13-15-31MM (Cage) IMPLANT
CANISTER SUCT 3000ML PPV (MISCELLANEOUS) ×3 IMPLANT
CAP REVERE LOCKING (Cap) ×18 IMPLANT
CARTRIDGE OIL MAESTRO DRILL (MISCELLANEOUS) ×1 IMPLANT
CLOSURE WOUND 1/2 X4 (GAUZE/BANDAGES/DRESSINGS) ×1
CONT SPEC 4OZ CLIKSEAL STRL BL (MISCELLANEOUS) ×3 IMPLANT
COVER BACK TABLE 60X90IN (DRAPES) ×3 IMPLANT
COVER WAND RF STERILE (DRAPES) IMPLANT
DECANTER SPIKE VIAL GLASS SM (MISCELLANEOUS) ×3 IMPLANT
DIFFUSER DRILL AIR PNEUMATIC (MISCELLANEOUS) ×3 IMPLANT
DRAPE C-ARM 42X72 X-RAY (DRAPES) ×6 IMPLANT
DRAPE HALF SHEET 40X57 (DRAPES) ×3 IMPLANT
DRAPE LAPAROTOMY 100X72X124 (DRAPES) ×3 IMPLANT
DRAPE SURG 17X23 STRL (DRAPES) ×12 IMPLANT
DRSG OPSITE POSTOP 4X6 (GAUZE/BANDAGES/DRESSINGS) ×3 IMPLANT
ELECT BLADE 4.0 EZ CLEAN MEGAD (MISCELLANEOUS) ×3
ELECT REM PT RETURN 9FT ADLT (ELECTROSURGICAL) ×3
ELECTRODE BLDE 4.0 EZ CLN MEGD (MISCELLANEOUS) ×1 IMPLANT
ELECTRODE REM PT RTRN 9FT ADLT (ELECTROSURGICAL) ×1 IMPLANT
GAUZE 4X4 16PLY RFD (DISPOSABLE) ×3 IMPLANT
GAUZE SPONGE 4X4 12PLY STRL (GAUZE/BANDAGES/DRESSINGS) IMPLANT
GLOVE BIO SURGEON STRL SZ 6.5 (GLOVE) ×8 IMPLANT
GLOVE BIO SURGEON STRL SZ8 (GLOVE) ×9 IMPLANT
GLOVE BIO SURGEON STRL SZ8.5 (GLOVE) ×6 IMPLANT
GLOVE BIO SURGEONS STRL SZ 6.5 (GLOVE) ×4
GLOVE BIOGEL PI IND STRL 6.5 (GLOVE) ×2 IMPLANT
GLOVE BIOGEL PI INDICATOR 6.5 (GLOVE) ×4
GLOVE EXAM NITRILE XL STR (GLOVE) IMPLANT
GLOVE SURG SS PI 7.5 STRL IVOR (GLOVE) ×21 IMPLANT
GOWN STRL REUS W/ TWL LRG LVL3 (GOWN DISPOSABLE) ×4 IMPLANT
GOWN STRL REUS W/ TWL XL LVL3 (GOWN DISPOSABLE) ×3 IMPLANT
GOWN STRL REUS W/TWL 2XL LVL3 (GOWN DISPOSABLE) IMPLANT
GOWN STRL REUS W/TWL LRG LVL3 (GOWN DISPOSABLE) ×8
GOWN STRL REUS W/TWL XL LVL3 (GOWN DISPOSABLE) ×6
HEMOSTAT POWDER KIT SURGIFOAM (HEMOSTASIS) ×6 IMPLANT
HEMOSTAT POWDER SURGIFOAM 1G (HEMOSTASIS) IMPLANT
KIT BASIN OR (CUSTOM PROCEDURE TRAY) ×3 IMPLANT
KIT TURNOVER KIT B (KITS) ×3 IMPLANT
MILL MEDIUM DISP (BLADE) IMPLANT
NEEDLE HYPO 21X1.5 SAFETY (NEEDLE) ×3 IMPLANT
NEEDLE HYPO 22GX1.5 SAFETY (NEEDLE) ×3 IMPLANT
NS IRRIG 1000ML POUR BTL (IV SOLUTION) ×3 IMPLANT
OIL CARTRIDGE MAESTRO DRILL (MISCELLANEOUS) ×3
PACK LAMINECTOMY NEURO (CUSTOM PROCEDURE TRAY) ×3 IMPLANT
PAD ARMBOARD 7.5X6 YLW CONV (MISCELLANEOUS) ×9 IMPLANT
PATTIES SURGICAL .5 X1 (DISPOSABLE) IMPLANT
PATTIES SURGICAL 1X1 (DISPOSABLE) ×3 IMPLANT
PUTTY DBM 10CC CALC GRAN (Putty) ×3 IMPLANT
ROD REVERE 6.35 CURVED 70MM (Rod) ×3 IMPLANT
ROD REVERE 7.5MM (Rod) ×3 IMPLANT
SCREW REVERE 6.35 6.5MMX45 (Screw) ×18 IMPLANT
SPACER ALTERA 10X31-15 (Spacer) IMPLANT
SPONGE LAP 4X18 RFD (DISPOSABLE) IMPLANT
SPONGE NEURO XRAY DETECT 1X3 (DISPOSABLE) IMPLANT
SPONGE SURGIFOAM ABS GEL 100 (HEMOSTASIS) IMPLANT
STRIP CLOSURE SKIN 1/2X4 (GAUZE/BANDAGES/DRESSINGS) ×2 IMPLANT
SUT VIC AB 1 CT1 18XBRD ANBCTR (SUTURE) ×2 IMPLANT
SUT VIC AB 1 CT1 8-18 (SUTURE) ×6
SUT VIC AB 2-0 CP2 18 (SUTURE) ×6 IMPLANT
SYR 20CC LL (SYRINGE) ×3 IMPLANT
TOWEL GREEN STERILE (TOWEL DISPOSABLE) ×3 IMPLANT
TOWEL GREEN STERILE FF (TOWEL DISPOSABLE) ×3 IMPLANT
TRAY FOLEY MTR SLVR 16FR STAT (SET/KITS/TRAYS/PACK) ×3 IMPLANT
WATER STERILE IRR 1000ML POUR (IV SOLUTION) ×3 IMPLANT

## 2019-03-31 NOTE — Op Note (Signed)
Brief history: The patient is a 58 year old black female who has complained of back and leg pain consistent with neurogenic claudication.  She has failed medical management and was worked up with a lumbar MRI and lumbar x-rays.  This demonstrated an L3-4 and L4-5 spondylolisthesis, facet arthropathy, stenosis, etc.  I discussed the various treatment options with her.  She has decided proceed with surgery.  Preoperative diagnosis: L3-4 and L4-5 spondylolisthesis, facet arthropathy, degenerative disc disease, spinal stenosis compressing L3, L4 and the L5 nerve roots; lumbago; lumbar radiculopathy; neurogenic claudication  Postoperative diagnosis: The same  Procedure: Bilateral L3-4 and L4-5 laminotomy/foraminotomies/medial facetectomy to decompress the bilateral L3, L4 and L5 nerve roots(the work required to do this was in addition to the work required to do the posterior lumbar interbody fusion because of the patient's spinal stenosis, facet arthropathy. Etc. requiring a wide decompression of the nerve roots.);  L3-4 and L4-5 transforaminal lumbar interbody fusion with local morselized autograft bone and Zimmer DBM; insertion of interbody prosthesis at L3-4 and L4-5 (globus peek expandable interbody prosthesis); posterior segmental instrumentation from L3 to L5 with globus titanium pedicle screws and rods; posterior lateral arthrodesis at L3-4 and L4-5 bilaterally with local morselized autograft bone and Zimmer DBM.  Surgeon: Dr. Earle Gell  Asst.: Arnetha Massy nurse practitioner  Anesthesia: Gen. endotracheal  Estimated blood loss: 300 cc  Drains: None  Complications: None  Description of procedure: The patient was brought to the operating room by the anesthesia team. General endotracheal anesthesia was induced. The patient was turned to the prone position on the Wilson frame. The patient's lumbosacral region was then prepared with Betadine scrub and Betadine solution. Sterile drapes were  applied.  I then injected the area to be incised with Marcaine with epinephrine solution. I then used the scalpel to make a linear midline incision over the L3-4 and L4-5 interspace. I then used electrocautery to perform a bilateral subperiosteal dissection exposing the spinous process and lamina of L3, L4 and L5. We then obtained intraoperative radiograph to confirm our location. We then inserted the Verstrac retractor to provide exposure.  I began the decompression by using the high speed drill to perform laminotomies at L3-4 and L4-5 bilaterally. We then used the Kerrison punches to widen the laminotomy and removed the ligamentum flavum at L3-4 and L4-5 bilaterally. We used the Kerrison punches to remove the medial facets at L3-4 and L4-5 bilaterally. We performed wide foraminotomies about the bilateral L3, L4 and L5 nerve roots completing the decompression.  We now turned our attention to the posterior lumbar interbody fusion. I used a scalpel to incise the intervertebral disc at L3-4 and L4-5 bilaterally. I then performed a partial intervertebral discectomy at L3-4 and L4-5 bilaterally using the pituitary forceps. We prepared the vertebral endplates at H8-4 and O9-6 bilaterally for the fusion by removing the soft tissues with the curettes. We then used the trial spacers to pick the appropriate sized interbody prosthesis. We prefilled his prosthesis with a combination of local morselized autograft bone that we obtained during the decompression as well as Zimmer DBM. We inserted the prefilled prosthesis into the interspace at L3-4 and L4-5 from the right, we then turned and expanded the prosthesis. There was a good snug fit of the prosthesis in the interspace. We then filled and the remainder of the intervertebral disc space with local morselized autograft bone and Zimmer DBM. This completed the posterior lumbar interbody arthrodesis.  We now turned attention to the instrumentation. Under fluoroscopic  guidance  we cannulated the bilateral L3, L4 and L5 pedicles with the bone probe. We then removed the bone probe. We then tapped the pedicle with a 6.5 millimeter tap. We then removed the tap. We probed inside the tapped pedicle with a ball probe to rule out cortical breaches. We then inserted a 7.5 x 45 millimeter pedicle screw into the L3, L4 and L5 pedicles bilaterally under fluoroscopic guidance. We then palpated along the medial aspect of the pedicles to rule out cortical breaches. There were none. The nerve roots were not injured. We then connected the unilateral pedicle screws with a lordotic rod. We compressed the construct and secured the rod in place with the caps. We then tightened the caps appropriately. This completed the instrumentation from L3-L5 bilaterally.  We now turned our attention to the posterior lateral arthrodesis at 3 4 and L4-5 bilaterally. We used the high-speed drill to decorticate the remainder of the facets, pars, transverse process at L3-4 and L4-5 bilaterally. We then applied a combination of local morselized autograft bone and Zimmer DBM over these decorticated posterior lateral structures. This completed the posterior lateral arthrodesis.  We then obtained hemostasis using bipolar electrocautery. We irrigated the wound out with bacitracin solution. We inspected the thecal sac and nerve roots and noted they were well decompressed. We then removed the retractor. We injected Exparel . We reapproximated patient's thoracolumbar fascia with interrupted #1 Vicryl suture. We reapproximated patient's subcutaneous tissue with interrupted 2-0 Vicryl suture. The reapproximated patient's skin with Steri-Strips and benzoin. The wound was then coated with bacitracin ointment. A sterile dressing was applied. The drapes were removed. The patient was subsequently returned to the supine position where they were extubated by the anesthesia team. He was then transported to the post anesthesia care  unit in stable condition. All sponge instrument and needle counts were reportedly correct at the end of this case.

## 2019-03-31 NOTE — Anesthesia Procedure Notes (Signed)
Procedure Name: Intubation Date/Time: 03/31/2019 1:19 PM Performed by: Harden Mo, CRNA Pre-anesthesia Checklist: Patient identified, Emergency Drugs available, Suction available and Patient being monitored Patient Re-evaluated:Patient Re-evaluated prior to induction Oxygen Delivery Method: Circle System Utilized Preoxygenation: Pre-oxygenation with 100% oxygen Induction Type: IV induction Ventilation: Mask ventilation without difficulty and Oral airway inserted - appropriate to patient size Laryngoscope Size: Sabra Heck and 2 Grade View: Grade I Tube type: Oral Tube size: 7.5 mm Number of attempts: 1 Airway Equipment and Method: Stylet and Oral airway Placement Confirmation: ETT inserted through vocal cords under direct vision,  positive ETCO2 and breath sounds checked- equal and bilateral Secured at: 22 cm Tube secured with: Tape Dental Injury: Teeth and Oropharynx as per pre-operative assessment

## 2019-03-31 NOTE — Progress Notes (Signed)
Subjective: The patient is somnolent.  She does not follow commands.  She is in no apparent distress.  Objective: Vital signs in last 24 hours: Temp:  [98.7 F (37.1 C)] 98.7 F (37.1 C) (06/17 1008) Pulse Rate:  [80] 80 (06/17 1008) Resp:  [18] 18 (06/17 1008) BP: (139)/(84) 139/84 (06/17 1008) SpO2:  [100 %] 100 % (06/17 1008) Estimated body mass index is 45.69 kg/m as calculated from the following:   Height as of 03/29/19: 5\' 2"  (1.575 m).   Weight as of 03/29/19: 113.3 kg.   Intake/Output from previous day: No intake/output data recorded. Intake/Output this shift: Total I/O In: 2400 [I.V.:2400] Out: 240 [Urine:40; Blood:200]  Physical exam the patient is somnolent.  Lab Results: No results for input(s): WBC, HGB, HCT, PLT in the last 72 hours. BMET No results for input(s): NA, K, CL, CO2, GLUCOSE, BUN, CREATININE, CALCIUM in the last 72 hours.  Studies/Results: Dg Lumbar Spine 1 View  Result Date: 03/31/2019 CLINICAL DATA:  Needle localization EXAM: LUMBAR SPINE - 1 VIEW COMPARISON:  None. FINDINGS: Surgical instruments are seen at the L4-5 level. IMPRESSION: Surgical instruments are seen at the L4-5 level. Electronically Signed   By: Dorise Bullion III M.D   On: 03/31/2019 16:01    Assessment/Plan: Status post 2 level lumbar fusion: I spoke with the patient's husband and son.  LOS: 0 days     Ophelia Charter 03/31/2019, 5:48 PM

## 2019-03-31 NOTE — H&P (Signed)
Subjective: The patient is a 58 year old obese black female who has complained of back and leg pain consistent with neurogenic claudication.  She has failed medical management and was worked up with lumbar x-rays and a lumbar MRI which demonstrated spondylolisthesis, facet arthropathy, spinal stenosis, etc. at L3-4 and L4-5.  I discussed the various treatment options with the patient.  She has decided to proceed with surgery.  Past Medical History:  Diagnosis Date  . Anginal pain ([place])   . Chronic back pain   . DDD (degenerative disc disease), cervical   . Depression   . Diabetes mellitus without complication ([place])   . Dyspnea   . Hypertension    at age 17  . Obesity   . Sinusitis   . Sleep apnea    at age 29    Past Surgical History:  Procedure Laterality Date  . ABDOMINAL HYSTERECTOMY    . BREAST BIOPSY Right 07/02/2006   [place] Core benign  . NASAL SINUS SURGERY      Allergies  Allergen Reactions  . Codeine Sulfate Rash  . Milk-Related Compounds Diarrhea and Other (See Comments)    Diarrhea and stomach upset    Social History   Tobacco Use  . Smoking status: Never Smoker  . Smokeless tobacco: Never Used  Substance Use Topics  . Alcohol use: Yes    Alcohol/week: 0.0 standard drinks    Comment: rare    Family History  Problem Relation Age of Onset  . Hypertension Mother   . Hypertension Other   . Schizophrenia Brother    Prior to Admission medications   Medication Sig Start Date End Date Taking? Authorizing Provider  amLODipine (NORVASC) 10 MG tablet Take 1 tablet (10 mg total) by mouth daily. 01/18/19  Yes Ladell Pier, MD  aspirin EC 81 MG tablet Take 1 tablet (81 mg total) by mouth daily. 10/19/18  Yes Ladell Pier, MD  atorvastatin (LIPITOR) 40 MG tablet Take 1 tablet (40 mg total) by mouth daily. 01/08/19  Yes Lorretta Harp, MD  Cholecalciferol (VITAMIN D3) 2000 UNITS TABS Take 2,000 Units by mouth daily. 09/19/15  Yes Funches, Josalyn, MD  DULoxetine  (CYMBALTA) 60 MG capsule Take 1 capsule (60 mg total) by mouth daily. 03/23/19  Yes Arfeen, Arlyce Harman, MD  hydrochlorothiazide (HYDRODIURIL) 25 MG tablet Take 1 tablet (25 mg total) by mouth daily. 01/18/19  Yes Ladell Pier, MD  hydrOXYzine (VISTARIL) 25 MG capsule Take 1 capsule (25 mg total) by mouth at bedtime as needed for anxiety. 03/23/19  Yes Arfeen, Arlyce Harman, MD  ibuprofen (ADVIL,MOTRIN) 200 MG tablet Take 800 mg by mouth every 6 (six) hours as needed for moderate pain.   Yes [provider]  Lurasidone HCl 60 MG TABS Take 1 tablet (60 mg total) by mouth daily with breakfast. 03/23/19  Yes Arfeen, Arlyce Harman, MD  meloxicam (MOBIC) 15 MG tablet TAKE 1 TABLET BY MOUTH DAILY. Patient taking differently: Take 15 mg by mouth daily as needed for pain.  03/14/17  Yes Funches, Adriana Mccallum, MD  metFORMIN (GLUCOPHAGE) 500 MG tablet Take 1 tablet (500 mg total) by mouth daily with breakfast. 10/19/18  Yes Ladell Pier, MD  lisinopril (PRINIVIL,ZESTRIL) 40 MG tablet Take 1 tablet (40 mg total) by mouth daily. 10/19/18   Ladell Pier, MD  montelukast (SINGULAIR) 10 MG tablet Take 1 tablet (10 mg total) by mouth at bedtime. 08/22/16   Funches, Adriana Mccallum, MD  triamcinolone (NASACORT AQ) 55 MCG/ACT AERO nasal  inhaler Place 2 sprays into the nose daily. 08/22/16   Boykin Nearing, MD     Review of Systems  Positive ROS: As above  All other systems have been reviewed and were otherwise negative with the exception of those mentioned in the HPI and as above.  Objective: Vital signs in last 24 hours: Temp:  [98.7 F (37.1 C)] 98.7 F (37.1 C) (06/17 1008) Pulse Rate:  [80] 80 (06/17 1008) Resp:  [18] 18 (06/17 1008) BP: (139)/(84) 139/84 (06/17 1008) SpO2:  [100 %] 100 % (06/17 1008) Estimated body mass index is 45.69 kg/m as calculated from the following:   Height as of 03/29/19: 5\' 2"  (1.575 m).   Weight as of 03/29/19: 113.3 kg.   General Appearance: Alert Head: Normocephalic, without obvious  abnormality, atraumatic Eyes: PERRL, conjunctiva/corneas clear, EOM's intact,    Ears: Normal  Throat: Normal  Neck: Supple, Back: unremarkable Lungs: Clear to auscultation bilaterally, respirations unlabored Heart: Regular rate and rhythm, no murmur, rub or gallop Abdomen: Soft, non-tender Extremities: Extremities normal, atraumatic, no cyanosis or edema Skin: unremarkable  NEUROLOGIC:   Mental status: alert and oriented,Motor Exam - grossly normal Sensory Exam - grossly normal Reflexes:  Coordination - grossly normal Gait - grossly normal Balance - grossly normal Cranial Nerves: I: smell Not tested  II: visual acuity  OS: Normal  OD: Normal   II: visual fields Full to confrontation  II: pupils Equal, round, reactive to light  III,VII: ptosis None  III,IV,VI: extraocular muscles  Full ROM  V: mastication Normal  V: facial light touch sensation  Normal  V,VII: corneal reflex  Present  VII: facial muscle function - upper  Normal  VII: facial muscle function - lower Normal  VIII: hearing Not tested  IX: soft palate elevation  Normal  IX,X: gag reflex Present  XI: trapezius strength  5/5  XI: sternocleidomastoid strength 5/5  XI: neck flexion strength  5/5  XII: tongue strength  Normal    Data Review Lab Results  Component Value Date   WBC 7.8 03/25/2019   HGB 12.0 03/25/2019   HCT 36.5 03/25/2019   MCV 80 03/25/2019   PLT 367 03/25/2019   Lab Results  Component Value Date   NA 143 03/25/2019   K 3.6 03/25/2019   CL 102 03/25/2019   CO2 24 03/25/2019   BUN 17 03/25/2019   CREATININE 0.94 03/25/2019   GLUCOSE 110 (H) 03/25/2019   No results found for: INR, PROTIME  Assessment/Plan: L3-4 and L4-5 spondylolisthesis, facet arthropathy, spinal stenosis, lumbago, lumbar radiculopathy, neurogenic claudication: I have discussed the situation with the patient and reviewed her imaging studies with her.  I have pointed out the abnormalities.  We have discussed the  various treatment options including surgery.  I have described the surgical treatment option of an L3-4 and L4-5 decompression, instrumentation and fusion.  I have shown her surgical models.  I have given her surgical pamphlet.  We have discussed the risks, benefits, alternatives, expected postoperative course, and likelihood of achieving our goals with surgery.  I have answered all her questions.  She has decided proceed with surgery.   Ophelia Charter 03/31/2019 12:58 PM

## 2019-03-31 NOTE — Transfer of Care (Signed)
Immediate Anesthesia Transfer of Care Note  Patient: Michele Wood  Procedure(s) Performed: Lumbar three-four, Lumbar four-five Posterior lumbar interbody fusion, interbody prosthesis,posterior instrumentation (N/A Back)  Patient Location: PACU  Anesthesia Type:General  Level of Consciousness: awake and alert   Airway & Oxygen Therapy: Patient Spontanous Breathing and Patient connected to face mask oxygen  Post-op Assessment: Report given to RN and Post -op Vital signs reviewed and stable  Post vital signs: Reviewed and stable  Last Vitals:  Vitals Value Taken Time  BP 121/72 03/31/19 1751  Temp    Pulse 86 03/31/19 1755  Resp 25 03/31/19 1755  SpO2 97 % 03/31/19 1755  Vitals shown include unvalidated device data.  Last Pain:  Vitals:   03/31/19 1008  TempSrc: Oral         Complications: No apparent anesthesia complications

## 2019-04-01 LAB — CBC
HCT: 31.3 % — ABNORMAL LOW (ref 36.0–46.0)
Hemoglobin: 10 g/dL — ABNORMAL LOW (ref 12.0–15.0)
MCH: 25.9 pg — ABNORMAL LOW (ref 26.0–34.0)
MCHC: 31.9 g/dL (ref 30.0–36.0)
MCV: 81.1 fL (ref 80.0–100.0)
Platelets: 288 10*3/uL (ref 150–400)
RBC: 3.86 MIL/uL — ABNORMAL LOW (ref 3.87–5.11)
RDW: 13.2 % (ref 11.5–15.5)
WBC: 13.6 10*3/uL — ABNORMAL HIGH (ref 4.0–10.5)
nRBC: 0 % (ref 0.0–0.2)

## 2019-04-01 LAB — BASIC METABOLIC PANEL
Anion gap: 11 (ref 5–15)
BUN: 15 mg/dL (ref 6–20)
CO2: 26 mmol/L (ref 22–32)
Calcium: 8.1 mg/dL — ABNORMAL LOW (ref 8.9–10.3)
Chloride: 102 mmol/L (ref 98–111)
Creatinine, Ser: 0.99 mg/dL (ref 0.44–1.00)
GFR calc Af Amer: >60 mL/min (ref >60)
GFR calc non Af Amer: >60 mL/min (ref >60)
Glucose, Bld: 131 mg/dL — ABNORMAL HIGH (ref 70–99)
Potassium: 3.4 mmol/L — ABNORMAL LOW (ref 3.5–5.1)
Sodium: 139 mmol/L (ref 135–145)

## 2019-04-01 LAB — GLUCOSE, CAPILLARY
Glucose-Capillary: 109 mg/dL — ABNORMAL HIGH (ref 70–99)
Glucose-Capillary: 130 mg/dL — ABNORMAL HIGH (ref 70–99)
Glucose-Capillary: 126 mg/dL — ABNORMAL HIGH (ref 70–99)
Glucose-Capillary: 130 mg/dL — ABNORMAL HIGH (ref 70–99)

## 2019-04-01 MED ORDER — POTASSIUM CHLORIDE CRYS ER 20 MEQ PO TBCR
20.0000 meq | EXTENDED_RELEASE_TABLET | Freq: Two times a day (BID) | ORAL | Status: DC
  Administered 2019-04-01 (×2): 20 meq via ORAL
  Filled 2019-04-01 (×2): qty 1

## 2019-04-01 MED ORDER — METHOCARBAMOL 750 MG PO TABS
750.0000 mg | ORAL_TABLET | Freq: Four times a day (QID) | ORAL | Status: DC | PRN
  Administered 2019-04-01 – 2019-04-02 (×3): 750 mg via ORAL
  Filled 2019-04-01 (×3): qty 1

## 2019-04-01 MED FILL — Gelatin Absorbable MT Powder: OROMUCOSAL | Qty: 1 | Status: AC

## 2019-04-01 MED FILL — Thrombin For Soln 5000 Unit: CUTANEOUS | Qty: 5000 | Status: AC

## 2019-04-01 NOTE — Anesthesia Postprocedure Evaluation (Signed)
Anesthesia Post Note  Patient: Michele Wood  Procedure(s) Performed: Lumbar three-four, Lumbar four-five Posterior lumbar interbody fusion, interbody prosthesis,posterior instrumentation (N/A Back)     Patient location during evaluation: PACU Anesthesia Type: General Level of consciousness: awake and alert Pain management: pain level controlled Vital Signs Assessment: post-procedure vital signs reviewed and stable Respiratory status: spontaneous breathing, nonlabored ventilation, respiratory function stable and patient connected to nasal cannula oxygen Cardiovascular status: blood pressure returned to baseline and stable Postop Assessment: no apparent nausea or vomiting Anesthetic complications: no    Last Vitals:  Vitals:   04/01/19 1234 04/01/19 1651  BP: 119/79 118/72  Pulse: 78 75  Resp: 18 18  Temp: 36.8 C 36.8 C  SpO2: 98% 100%    Last Pain:  Vitals:   04/01/19 1651  TempSrc: Oral  PainSc:                  [name] DAVID

## 2019-04-01 NOTE — Evaluation (Signed)
Occupational Therapy Evaluation Patient Details Name: Michele Wood MRN: [id number] DOB: [birth year] Today's Date: 04/01/2019    History of Present Illness Pt is a 58 y/o female now s/p Bilateral L3-4 and L4-5 laminotomy/foraminotomies/medial facetectomy and transforaminal lumbar interbody fusion. PMHx includes DDD, depression, DM, HTN, dyspnea    Clinical Impression   This 58 y/o female presents with the above. PTA pt reports she is mod independent with ADL and functional mobility using SPC. Pt performing functional mobility using SPC this session with overall minguard assist. She currently requires supervision for standing grooming ADL, minA for UB ADL (including brace management), and modA for LB ADL due to difficulty fully reaching LEs while maintaining back precautions. Pt reports she will return home with spouse who is able to assist with ADL PRN. Educated pt in back precautions, safety and compensatory strategies for performing ADL and functional transfers while maintaining precautions with pt verbalizing/return demonstrating understanding. She will benefit from continued acute OT services to maximize her safety and independence with ADL and mobility prior to return home. Do not anticipate pt will require follow up OT services after discharge.     Follow Up Recommendations  No OT follow up;Supervision/Assistance - 24 hour    Equipment Recommendations  3 in 1 bedside commode           Precautions / Restrictions Precautions Precautions: Back;Fall Precaution Booklet Issued: Yes (comment) Precaution Comments: issued and reviewed with pt Required Braces or Orthoses: Spinal Brace Spinal Brace: Applied in sitting position(adjusted in standing due to pt body habitus) Restrictions Weight Bearing Restrictions: No      Mobility Bed Mobility Overal bed mobility: Needs Assistance Bed Mobility: Sidelying to Sit   Sidelying to sit: Min guard       General bed mobility comments:  for general safety, use of bedrail and HOB slightly elevated; increased effort but no physical assist required; reviewed full log roll technique as pt already in sidelying upon entering room   Transfers Overall transfer level: Needs assistance Equipment used: Straight cane Transfers: Sit to/from Stand Sit to Stand: Min guard;Supervision         General transfer comment: for general safety and balance    Balance Overall balance assessment: Mild deficits observed, not formally tested                                         ADL either performed or assessed with clinical judgement   ADL Overall ADL's : Needs assistance/impaired Eating/Feeding: Modified independent;Sitting   Grooming: Supervision/safety;Standing;Oral care Grooming Details (indicate cue type and reason): min cues for using compensatory method, good carryover Upper Body Bathing: Set up;Min guard;Sitting   Lower Body Bathing: Moderate assistance;Sit to/from stand   Upper Body Dressing : Minimal assistance;Sitting;Standing Upper Body Dressing Details (indicate cue type and reason): assist for brace management; verbally reviewed during donning of brace Lower Body Dressing: Moderate assistance;Sit to/from stand Lower Body Dressing Details (indicate cue type and reason): increased assist due to difficulty fully reaching LEs; reports spouse can assist Toilet Transfer: Min guard;Ambulation Toilet Transfer Details (indicate cue type and reason): using SPC; simulated via transfer to recliner; room level mobility Toileting- Clothing Manipulation and Hygiene: Min guard;Sit to/from Nurse, children's Details (indicate cue type and reason): verbally reviewed safe transfer techniques; educated on use of 3:1 as shower seat and to perform bathing in sitting initially for  increased safety, ease of accessing LEs while maintaining precautions, educated in use of LH sponge for LEs Functional mobility during  ADLs: Min guard;Supervision/safety;Cane                           Pertinent Vitals/Pain Pain Assessment: Faces Faces Pain Scale: Hurts little more Pain Location: back, incisional, mostly with sit<>stand transitions Pain Descriptors / Indicators: Discomfort;Grimacing Pain Intervention(s): Limited activity within patient's tolerance;Monitored during session;Repositioned     Hand Dominance     Extremity/Trunk Assessment Upper Extremity Assessment Upper Extremity Assessment: Overall WFL for tasks assessed   Lower Extremity Assessment Lower Extremity Assessment: Defer to PT evaluation   Cervical / Trunk Assessment Cervical / Trunk Assessment: Other exceptions Cervical / Trunk Exceptions: s/p spinal sx   Communication Communication Communication: No difficulties   Cognition Arousal/Alertness: Awake/alert Behavior During Therapy: WFL for tasks assessed/performed;Flat affect Overall Cognitive Status: Within Functional Limits for tasks assessed                                     General Comments       Exercises     Shoulder Instructions      Home Living Family/patient expects to be discharged to:: Private residence Living Arrangements: Spouse/significant other;Children Available Help at Discharge: Family;Available 24 hours/day Type of Home: House Home Access: Ramped entrance     Home Layout: One level     Bathroom Shower/Tub: Teacher, early years/pre: Standard     Home Equipment: Environmental consultant - 2 wheels;Cane - single point          Prior Functioning/Environment Level of Independence: Independent with assistive device(s)        Comments: reports mostly using cane for ambulation        OT Problem List: Decreased range of motion;Decreased activity tolerance;Impaired balance (sitting and/or standing);Decreased knowledge of use of DME or AE;Decreased knowledge of precautions;Obesity;Pain      OT Treatment/Interventions:  Self-care/ADL training;Therapeutic exercise;DME and/or AE instruction;Therapeutic activities;Patient/family education;Balance training    OT Goals(Current goals can be found in the care plan section) Acute Rehab OT Goals Patient Stated Goal: none stated today, agreeable to working with therapy OT Goal Formulation: With patient Time For Goal Achievement: 04/15/19 Potential to Achieve Goals: Good  OT Frequency: Min 2X/week   Barriers to D/C:            Co-evaluation              AM-PAC OT "6 Clicks" Daily Activity     Outcome Measure Help from another person eating meals?: None Help from another person taking care of personal grooming?: None Help from another person toileting, which includes using toliet, bedpan, or urinal?: A Little Help from another person bathing (including washing, rinsing, drying)?: A Little Help from another person to put on and taking off regular upper body clothing?: A Little Help from another person to put on and taking off regular lower body clothing?: A Little 6 Click Score: 20   End of Session Equipment Utilized During Treatment: Back brace;Other (comment)(SPC) Nurse Communication: Mobility status  Activity Tolerance: Patient tolerated treatment well Patient left: Other (comment)(hand off to PT for session)  OT Visit Diagnosis: Other abnormalities of gait and mobility (R26.89);Pain Pain - part of body: (back)                Time: 0626-9485 OT  Time Calculation (min): 30 min Charges:  OT General Charges $OT Visit: 1 Visit OT Evaluation $OT Eval Low Complexity: 1 Low OT Treatments $Self Care/Home Management : 8-22 mins  Michele Wood, OT Supplemental Rehabilitation Services Pager [phone] Office [phone]   Raymondo Band 04/01/2019, 9:24 AM

## 2019-04-01 NOTE — Progress Notes (Signed)
Subjective: The patient is alert and pleasant.  She is in no apparent distress.  Objective: Vital signs in last 24 hours: Temp:  [97.8 F (36.6 C)-98.5 F (36.9 C)] 98 F (36.7 C) (06/18 0803) Pulse Rate:  [74-90] 78 (06/18 0803) Resp:  [12-20] 18 (06/18 0803) BP: (118-141)/(72-85) 119/73 (06/18 0803) SpO2:  [92 %-97 %] 97 % (06/18 0803) Estimated body mass index is 45.69 kg/m as calculated from the following:   Height as of 03/29/19: 5\' 2"  (1.575 m).   Weight as of 03/29/19: 113.3 kg.   Intake/Output from previous day: 06/17 0701 - 06/18 0700 In: 2840 [P.O.:240; I.V.:2400; IV Piggyback:200] Out: 790 [Urine:590; Blood:200] Intake/Output this shift: No intake/output data recorded.  Physical exam the patient is alert and oriented.  She is moving her lower extremities well.  Her dressing is clean and dry.  Lab Results: Recent Labs    04/01/19 0626  WBC 13.6*  HGB 10.0*  HCT 31.3*  PLT 288   BMET Recent Labs    04/01/19 0626  NA 139  K 3.4*  CL 102  CO2 26  GLUCOSE 131*  BUN 15  CREATININE 0.99  CALCIUM 8.1*    Studies/Results: Dg Lumbar Spine 2-3 Views  Result Date: 03/31/2019 CLINICAL DATA:  Posterior fusion EXAM: DG C-ARM 61-120 MIN; LUMBAR SPINE - 2-3 VIEW COMPARISON:  03/31/2019 FINDINGS: Intraoperative spot images demonstrate posterior fusion changes from L3-L5. Normal alignment. No hardware complicating feature. IMPRESSION: Posterior fusion L3-L5.  No visible complicating feature. Electronically Signed   By: Rolm Baptise M.D.   On: 03/31/2019 19:27   Dg Lumbar Spine 1 View  Result Date: 03/31/2019 CLINICAL DATA:  Needle localization EXAM: LUMBAR SPINE - 1 VIEW COMPARISON:  None. FINDINGS: Surgical instruments are seen at the L4-5 level. IMPRESSION: Surgical instruments are seen at the L4-5 level. Electronically Signed   By: Dorise Bullion III M.D   On: 03/31/2019 16:01   Dg C-arm 1-60 Min  Result Date: 03/31/2019 CLINICAL DATA:  Posterior fusion EXAM:  DG C-ARM 61-120 MIN; LUMBAR SPINE - 2-3 VIEW COMPARISON:  03/31/2019 FINDINGS: Intraoperative spot images demonstrate posterior fusion changes from L3-L5. Normal alignment. No hardware complicating feature. IMPRESSION: Posterior fusion L3-L5.  No visible complicating feature. Electronically Signed   By: Rolm Baptise M.D.   On: 03/31/2019 19:27    Assessment/Plan: Postop day #1: The patient is doing well.  She may go home tomorrow.  We will mobilize her with PT and OT.  Hypokalemia: I added K dur.  LOS: 1 day     Michele Wood 04/01/2019, 10:11 AM

## 2019-04-01 NOTE — Progress Notes (Signed)
   Providing Compassionate, Quality Care - Together   Subjective: Patient reports pain is about a 6/10. The pain is primarily in her back. She worked with physical therapy and occupational therapy this morning.  Objective: Vital signs in last 24 hours: Temp:  [97.8 F (36.6 C)-98.7 F (37.1 C)] 98 F (36.7 C) (06/18 0803) Pulse Rate:  [74-90] 78 (06/18 0803) Resp:  [12-20] 18 (06/18 0803) BP: (118-141)/(72-85) 119/73 (06/18 0803) SpO2:  [92 %-100 %] 97 % (06/18 0803)  Intake/Output from previous day: 06/17 0701 - 06/18 0700 In: 2840 [P.O.:240; I.V.:2400; IV Piggyback:200] Out: 790 [Urine:590; Blood:200] Intake/Output this shift: No intake/output data recorded.  Alert and oriented x 4 PERRLA MAE Incision clean, dry, and intact; Honeycomb dressing and steri strips in place  Lab Results: Recent Labs    04/01/19 0626  WBC 13.6*  HGB 10.0*  HCT 31.3*  PLT 288   BMET Recent Labs    04/01/19 0626  NA 139  K 3.4*  CL 102  CO2 26  GLUCOSE 131*  BUN 15  CREATININE 0.99  CALCIUM 8.1*    Studies/Results: Dg Lumbar Spine 2-3 Views  Result Date: 03/31/2019 CLINICAL DATA:  Posterior fusion EXAM: DG C-ARM 61-120 MIN; LUMBAR SPINE - 2-3 VIEW COMPARISON:  03/31/2019 FINDINGS: Intraoperative spot images demonstrate posterior fusion changes from L3-L5. Normal alignment. No hardware complicating feature. IMPRESSION: Posterior fusion L3-L5.  No visible complicating feature. Electronically Signed   By: Rolm Baptise M.D.   On: 03/31/2019 19:27   Dg Lumbar Spine 1 View  Result Date: 03/31/2019 CLINICAL DATA:  Needle localization EXAM: LUMBAR SPINE - 1 VIEW COMPARISON:  None. FINDINGS: Surgical instruments are seen at the L4-5 level. IMPRESSION: Surgical instruments are seen at the L4-5 level. Electronically Signed   By: Dorise Bullion III M.D   On: 03/31/2019 16:01   Dg C-arm 1-60 Min  Result Date: 03/31/2019 CLINICAL DATA:  Posterior fusion EXAM: DG C-ARM 61-120 MIN; LUMBAR  SPINE - 2-3 VIEW COMPARISON:  03/31/2019 FINDINGS: Intraoperative spot images demonstrate posterior fusion changes from L3-L5. Normal alignment. No hardware complicating feature. IMPRESSION: Posterior fusion L3-L5.  No visible complicating feature. Electronically Signed   By: Rolm Baptise M.D.   On: 03/31/2019 19:27    Assessment/Plan: Patient is one day s/p bilateral L3-4 and L4-5 laminotomy/foraminotomies/medial facetectomy and transforaminal lumbar interbody fusion. She has worked with therapies this morning, but requests one more day to continue working with therapies and get better pain control. Potassium this morning is 3.4.   LOS: 1 day    -Patient will stay one more day. Expected discharge 04/02/2019 -Continue to mobilize with assistance -20 mEq K+ BID for potassium of 3.4  Viona Gilmore, DNP, AGNP-C Nurse Practitioner  [facility] [place] [address], [place], [place] [address] P: [phone]    F: [phone]  04/01/2019, 9:56 AM

## 2019-04-01 NOTE — Evaluation (Signed)
Physical Therapy Evaluation Patient Details Name: Michele Wood MRN: [id number] DOB: 1961/03/12 Today's Date: 04/01/2019   History of Present Illness  Pt is a 58 y/o female now s/p Bilateral L3-4 and L4-5 laminotomy/foraminotomies/medial facetectomy and transforaminal lumbar interbody fusion. PMHx includes DDD, depression, DM, HTN, dyspnea   Clinical Impression  Pt admitted with above diagnosis. Pt currently with functional limitations due to the deficits listed below (see PT Problem List). At the time of PT eval pt was able to perform transfers and ambulation with gross min guard assist to supervision for safety. Pt was educated on precautions, car transfer, activity progression, and brace application/wearing schedule. Pt will benefit from skilled PT to increase their independence and safety with mobility to allow discharge to the venue listed below.       Follow Up Recommendations No PT follow up;Supervision for mobility/OOB    Equipment Recommendations  None recommended by PT    Recommendations for Other Services       Precautions / Restrictions Precautions Precautions: Back;Fall Precaution Booklet Issued: Yes (comment) Precaution Comments: issued and reviewed with pt Required Braces or Orthoses: Spinal Brace Spinal Brace: Applied in sitting position(adjusted in standing due to pt body habitus) Restrictions Weight Bearing Restrictions: No      Mobility  Bed Mobility Overal bed mobility: Needs Assistance Bed Mobility: Sidelying to Sit   Sidelying to sit: Min guard       General bed mobility comments: Pt was received walking in room with OT  Transfers Overall transfer level: Needs assistance Equipment used: Straight cane Transfers: Sit to/from Stand Sit to Stand: Min guard;Supervision         General transfer comment: for general safety and balance  Ambulation/Gait Ambulation/Gait assistance: Min guard Gait Distance (Feet): 110 Feet Assistive device:  Straight cane Gait Pattern/deviations: Step-through pattern;Decreased stride length;Trunk flexed;Wide base of support Gait velocity: Decreased Gait velocity interpretation: 1.31 - 2.62 ft/sec, indicative of limited community ambulator General Gait Details: Pt fatiguing quickly and requiring several standing rest breaks. Pt generally steady with SPC however close guard for safety was provided.   Stairs            Wheelchair Mobility    Modified Rankin (Stroke Patients Only)       Balance Overall balance assessment: Mild deficits observed, not formally tested                                           Pertinent Vitals/Pain Pain Assessment: Faces Faces Pain Scale: Hurts little more Pain Location: back, incisional, mostly with sit<>stand transitions Pain Descriptors / Indicators: Discomfort;Grimacing Pain Intervention(s): Monitored during session    Home Living Family/patient expects to be discharged to:: Private residence Living Arrangements: Spouse/significant other;Children Available Help at Discharge: Family;Available 24 hours/day Type of Home: House Home Access: Ramped entrance     Home Layout: One level Home Equipment: Walker - 2 wheels;Cane - single point      Prior Function Level of Independence: Independent with assistive device(s)         Comments: reports mostly using cane for ambulation     Hand Dominance        Extremity/Trunk Assessment   Upper Extremity Assessment Upper Extremity Assessment: Defer to OT evaluation    Lower Extremity Assessment Lower Extremity Assessment: Generalized weakness    Cervical / Trunk Assessment Cervical / Trunk Assessment: Other exceptions Cervical / Trunk  Exceptions: s/p spinal sx  Communication   Communication: No difficulties  Cognition Arousal/Alertness: Awake/alert Behavior During Therapy: WFL for tasks assessed/performed;Flat affect Overall Cognitive Status: Within Functional Limits  for tasks assessed                                        General Comments      Exercises     Assessment/Plan    PT Assessment Patient needs continued PT services  PT Problem List Decreased strength;Decreased activity tolerance;Decreased balance;Decreased mobility;Decreased knowledge of use of DME;Decreased safety awareness;Decreased knowledge of precautions;Pain       PT Treatment Interventions DME instruction;Gait training;Functional mobility training;Therapeutic activities;Therapeutic exercise;Neuromuscular re-education;Patient/family education    PT Goals (Current goals can be found in the Care Plan section)  Acute Rehab PT Goals Patient Stated Goal: One more day to work with therapy and then home PT Goal Formulation: With patient Time For Goal Achievement: 04/08/19 Potential to Achieve Goals: Good    Frequency Min 5X/week   Barriers to discharge        Co-evaluation               AM-PAC PT "6 Clicks" Mobility  Outcome Measure Help needed turning from your back to your side while in a flat bed without using bedrails?: None Help needed moving from lying on your back to sitting on the side of a flat bed without using bedrails?: A Little Help needed moving to and from a bed to a chair (including a wheelchair)?: A Little Help needed standing up from a chair using your arms (e.g., wheelchair or bedside chair)?: A Little Help needed to walk in hospital room?: A Little Help needed climbing 3-5 steps with a railing? : A Lot 6 Click Score: 18    End of Session Equipment Utilized During Treatment: Gait belt Activity Tolerance: Patient tolerated treatment well Patient left: in chair;with call bell/phone within reach Nurse Communication: Mobility status PT Visit Diagnosis: Other symptoms and signs involving the nervous system (R29.898);Pain Pain - part of body: (back)    Time: 4132-4401 PT Time Calculation (min) (ACUTE ONLY): 13 min   Charges:    PT Evaluation $PT Eval Moderate Complexity: 1 Mod          Rolinda Roan, PT, DPT Acute Rehabilitation Services Pager: [phone] Office: [phone]   Thelma Comp 04/01/2019, 10:48 AM

## 2019-04-02 LAB — GLUCOSE, CAPILLARY: Glucose-Capillary: 87 mg/dL (ref 70–99)

## 2019-04-02 MED ORDER — METHOCARBAMOL 750 MG PO TABS: 750.0000 mg | ORAL_TABLET | Freq: Four times a day (QID) | ORAL | 1 refills | Status: DC | PRN

## 2019-04-02 MED ORDER — DOCUSATE SODIUM 100 MG PO CAPS: 100.0000 mg | ORAL_CAPSULE | Freq: Two times a day (BID) | ORAL | 0 refills | Status: DC

## 2019-04-02 MED ORDER — OXYCODONE HCL 10 MG PO TABS: 10.0000 mg | ORAL_TABLET | ORAL | 0 refills | Status: DC | PRN

## 2019-04-02 NOTE — Progress Notes (Signed)
Occupational Therapy Treatment Patient Details Name: Michele Wood MRN: [id number] DOB: 29-Oct-1960 Today's Date: 04/02/2019    History of present illness  Spondylolisthesis of lumbar region   OT comments  Pt. Seen for skilled OT session with focus on UB/LB ADLs. Reviewed tech. For ub/lb dressing while maintaining back precautions.  Also reviewed techniques and strategies for pericare after BM.  Pt. Declined any tub/shower practice reporting she is going to sponge bathe initially. Has husband and son who are available to assist as needed. Note d/c home today.   Follow Up Recommendations       Equipment Recommendations       Recommendations for Other Services      Precautions / Restrictions Precautions Precautions: Back Precaution Comments: pt. able to recall 2/3, cues required for no reaching/arching Required Braces or Orthoses: Spinal Brace Spinal Brace: Applied in sitting position Restrictions Weight Bearing Restrictions: No       Mobility Bed Mobility                  Transfers                      Balance                                           ADL either performed or assessed with clinical judgement   ADL Overall ADL's : Needs assistance/impaired           Upper Body Bathing Details (indicate cue type and reason): pt. reports she will sponge bathe initially and had no questions or concerns for completion of this task   Lower Body Bathing Details (indicate cue type and reason): pt. reports she will sponge bathe initially and had no questions or concerns for completion of this task Upper Body Dressing : Set up;Sitting Upper Body Dressing Details (indicate cue type and reason): pt. states she donned her brace without physical help Lower Body Dressing: Sitting/lateral leans;Sit to/from stand Lower Body Dressing Details (indicate cue type and reason): provided multiple options and demonstrations for LB dressing eob. pt. felt  that turning and bringing leg open to each side to reach BLES to reach feet for donning pants.  states husband able to assist but "isnt always patient" so she wanted to explore ways to complete on her own       Toileting - Clothing Manipulation Details (indicate cue type and reason): reviewed and provided demo of how to perform care without twisting   Tub/Shower Transfer Details (indicate cue type and reason): pt. declines due to reports she will be sponge bathing initially   General ADL Comments: reviewed all options for UB/LB adl completion     Vision       Perception     Praxis      Cognition                                                Exercises     Shoulder Instructions       General Comments      Pertinent Vitals/ Pain       Pain Assessment: No/denies pain  Home Living Family/patient expects to be discharged to:: Private residence Living Arrangements: Spouse/significant other;Children  Prior Functioning/Environment              Frequency           Progress Toward Goals  OT Goals(current goals can now be found in the care plan section)  Progress towards OT goals: Progressing toward goals     Plan Discharge plan remains appropriate    Co-evaluation                 AM-PAC OT "6 Clicks" Daily Activity     Outcome Measure                    End of Session Equipment Utilized During Treatment: Back brace      Activity Tolerance Patient tolerated treatment well   Patient Left in chair;with call bell/phone within reach   Nurse Communication          Time: 8675-4492 OT Time Calculation (min): 12 min  Charges: OT General Charges $OT Visit: 1 Visit OT Treatments $Self Care/Home Management : 8-22 mins   Janice Coffin, COTA/L 04/02/2019, 9:38 AM

## 2019-04-02 NOTE — Plan of Care (Signed)
Patient is pending discharge at this time.

## 2019-04-02 NOTE — Progress Notes (Signed)
Patient is discharged from room 3C06 at this time. Alert and in stable condition. IV site d/c'd and instructions read to patient with understanding verbalized. Left unit via wheelchair with all belongings at side.  

## 2019-04-02 NOTE — Discharge Instructions (Signed)

## 2019-04-02 NOTE — Discharge Summary (Signed)
Physician Discharge Summary  Patient ID: Michele Wood MRN: [id number] DOB/AGE: 57-13-1962 57 y.o.  Admit date: 03/31/2019 Discharge date: 04/02/2019  Admission Diagnoses: L3-4 and L4-5 spondylolisthesis, facet arthropathy, lumbago, lumbar radiculopathy  Discharge Diagnoses: The same Active Problems:   Spondylolisthesis of lumbar region   Discharged Condition: good  Hospital Course: I performed an L3-4 and L4-5 decompression, instrumentation and fusion on patient on 03/31/2019.  The surgery went well.  The patient's postoperative course was unremarkable.  On postoperative day #2 she requested discharge to home.  She was given written and oral discharge instructions.  All her questions were answered.  Consults: Physical therapy, Occupational Therapy, care management Significant Diagnostic Studies: None Treatments: L3-4 and L4-5 decompression, instrumentation and fusion. Discharge Exam: Blood pressure 129/79, pulse 84, temperature 98.2 F (36.8 C), temperature source Oral, resp. rate 20, last menstrual period 09/01/2013, SpO2 99 %. The patient is alert and pleasant.  She looks well.  Her lower extremity strength is normal.  Disposition: Home  Discharge Instructions    Call MD for:  difficulty breathing, headache or visual disturbances   Complete by: As directed    Call MD for:  extreme fatigue   Complete by: As directed    Call MD for:  hives   Complete by: As directed    Call MD for:  persistant dizziness or light-headedness   Complete by: As directed    Call MD for:  persistant nausea and vomiting   Complete by: As directed    Call MD for:  redness, tenderness, or signs of infection (pain, swelling, redness, odor or green/yellow discharge around incision site)   Complete by: As directed    Call MD for:  severe uncontrolled pain   Complete by: As directed    Call MD for:  temperature >100.4   Complete by: As directed    Diet - low sodium heart healthy   Complete by: As  directed    Discharge instructions   Complete by: As directed    Call [phone] for a followup appointment. Take a stool softener while you are using pain medications.   Driving Restrictions   Complete by: As directed    Do not drive for 2 weeks.   Increase activity slowly   Complete by: As directed    Lifting restrictions   Complete by: As directed    Do not lift more than 5 pounds. No excessive bending or twisting.   May shower / Bathe   Complete by: As directed    Remove the dressing for 3 days after surgery.  You may shower, but leave the incision alone.   Remove dressing in 24 hours   Complete by: As directed      Allergies as of 04/02/2019      Reactions   Codeine Sulfate Rash   Milk-related Compounds Diarrhea, Other (See Comments)   Diarrhea and stomach upset      Medication List    STOP taking these medications   ibuprofen 200 MG tablet Commonly known as: ADVIL   meloxicam 15 MG tablet Commonly known as: MOBIC     TAKE these medications   amLODipine 10 MG tablet Commonly known as: NORVASC Take 1 tablet (10 mg total) by mouth daily.   aspirin EC 81 MG tablet Take 1 tablet (81 mg total) by mouth daily.   atorvastatin 40 MG tablet Commonly known as: LIPITOR Take 1 tablet (40 mg total) by mouth daily.   docusate sodium 100 MG capsule Commonly  known as: COLACE Take 1 capsule (100 mg total) by mouth 2 (two) times daily.   DULoxetine 60 MG capsule Commonly known as: CYMBALTA Take 1 capsule (60 mg total) by mouth daily.   hydrochlorothiazide 25 MG tablet Commonly known as: HYDRODIURIL Take 1 tablet (25 mg total) by mouth daily.   hydrOXYzine 25 MG capsule Commonly known as: Vistaril Take 1 capsule (25 mg total) by mouth at bedtime as needed for anxiety.   lisinopril 40 MG tablet Commonly known as: ZESTRIL Take 1 tablet (40 mg total) by mouth daily.   Lurasidone HCl 60 MG Tabs Take 1 tablet (60 mg total) by mouth daily with breakfast.    metFORMIN 500 MG tablet Commonly known as: GLUCOPHAGE Take 1 tablet (500 mg total) by mouth daily with breakfast.   methocarbamol 750 MG tablet Commonly known as: ROBAXIN Take 1 tablet (750 mg total) by mouth every 6 (six) hours as needed for muscle spasms.   montelukast 10 MG tablet Commonly known as: Singulair Take 1 tablet (10 mg total) by mouth at bedtime.   Oxycodone HCl 10 MG Tabs Take 1 tablet (10 mg total) by mouth every 4 (four) hours as needed for severe pain ((score 7 to 10)).   triamcinolone 55 MCG/ACT Aero nasal inhaler Commonly known as: Nasacort AQ Place 2 sprays into the nose daily.   Vitamin D3 50 MCG (2000 UT) Tabs Take 2,000 Units by mouth daily.            Durable Medical Equipment  (From admission, onward)         Start     Ordered   04/01/19 1023  For home use only DME 3 n 1  Once     04/01/19 1022           Signed: Ophelia Charter 04/02/2019, 6:35 AM

## 2019-04-02 NOTE — Progress Notes (Signed)
Physical Therapy Treatment Patient Details Name: Michele Wood MRN: [id number] DOB: 28-Jul-1961 Today's Date: 04/02/2019    History of Present Illness Pt is a 58 y/o female now s/p Bilateral L3-4 and L4-5 laminotomy/foraminotomies/medial facetectomy and transforaminal lumbar interbody fusion. PMHx includes DDD, depression, DM, HTN, dyspnea     PT Comments    Pt progressing well with post-op mobility. She was educated on precautions throughout functional mobility, car transfer, and activity progression. Pt anticipates d/c home today. Will follow.    Follow Up Recommendations  No PT follow up;Supervision for mobility/OOB     Equipment Recommendations  None recommended by PT    Recommendations for Other Services       Precautions / Restrictions Precautions Precautions: Back Precaution Booklet Issued: Yes (comment) Precaution Comments: pt. able to recall 2/3, cues required for no reaching/arching Required Braces or Orthoses: Spinal Brace Spinal Brace: Applied in sitting position Restrictions Weight Bearing Restrictions: No    Mobility  Bed Mobility Overal bed mobility: Needs Assistance Bed Mobility: Sidelying to Sit   Sidelying to sit: Min guard       General bed mobility comments: Pt was received walking in room with OT  Transfers Overall transfer level: Needs assistance Equipment used: Straight cane Transfers: Sit to/from Stand Sit to Stand: Supervision         General transfer comment: for general safety and balance  Ambulation/Gait Ambulation/Gait assistance: Supervision Gait Distance (Feet): 250 Feet Assistive device: Straight cane Gait Pattern/deviations: Step-through pattern;Decreased stride length;Trunk flexed;Wide base of support Gait velocity: Decreased Gait velocity interpretation: <1.8 ft/sec, indicate of risk for recurrent falls General Gait Details: No standing rest breaks today however pt continues to appear guarded. No overt LOB noted.     Stairs             Wheelchair Mobility    Modified Rankin (Stroke Patients Only)       Balance Overall balance assessment: Mild deficits observed, not formally tested                                          Cognition Arousal/Alertness: Awake/alert Behavior During Therapy: WFL for tasks assessed/performed;Flat affect Overall Cognitive Status: Within Functional Limits for tasks assessed                                        Exercises      General Comments        Pertinent Vitals/Pain Pain Assessment: No/denies pain Faces Pain Scale: Hurts little more Pain Location: back, incisional, mostly with sit<>stand transitions Pain Descriptors / Indicators: Discomfort;Grimacing Pain Intervention(s): Monitored during session    Home Living                      Prior Function            PT Goals (current goals can now be found in the care plan section) Acute Rehab PT Goals Patient Stated Goal: Home today PT Goal Formulation: With patient Time For Goal Achievement: 04/08/19 Potential to Achieve Goals: Good Progress towards PT goals: Progressing toward goals    Frequency    Min 5X/week      PT Plan Current plan remains appropriate    Co-evaluation  AM-PAC PT "6 Clicks" Mobility   Outcome Measure  Help needed turning from your back to your side while in a flat bed without using bedrails?: None Help needed moving from lying on your back to sitting on the side of a flat bed without using bedrails?: A Little Help needed moving to and from a bed to a chair (including a wheelchair)?: A Little Help needed standing up from a chair using your arms (e.g., wheelchair or bedside chair)?: A Little Help needed to walk in hospital room?: A Little Help needed climbing 3-5 steps with a railing? : A Lot 6 Click Score: 18    End of Session Equipment Utilized During Treatment: Gait belt Activity Tolerance:  Patient tolerated treatment well Patient left: in chair;with call bell/phone within reach Nurse Communication: Mobility status PT Visit Diagnosis: Other symptoms and signs involving the nervous system (R29.898);Pain Pain - part of body: (back)     Time: 8250-5397 PT Time Calculation (min) (ACUTE ONLY): 13 min  Charges:  $Gait Training: 8-22 mins                     Michele Wood, PT, DPT Acute Rehabilitation Services Pager: [phone] Office: [phone]    Michele Wood 04/02/2019, 1:39 PM

## 2019-04-06 MED FILL — Sodium Chloride IV Soln 0.9%: INTRAVENOUS | Qty: 1000 | Status: AC

## 2019-04-06 MED FILL — Heparin Sodium (Porcine) Inj 1000 Unit/ML: INTRAMUSCULAR | Qty: 30 | Status: AC

## 2019-04-08 NOTE — Progress Notes (Signed)
Notes recorded by Lorretta Harp, MD on 03/29/2019 at 7:26 AM EDT  Call and tell normal

## 2019-04-13 ENCOUNTER — Ambulatory Visit (INDEPENDENT_AMBULATORY_CARE_PROVIDER_SITE_OTHER): Payer: Medicare Other | Admitting: Psychology

## 2019-04-13 DIAGNOSIS — F3289 Other specified depressive episodes: Secondary | ICD-10-CM

## 2019-04-14 ENCOUNTER — Other Ambulatory Visit (HOSPITAL_COMMUNITY): Payer: Self-pay | Admitting: Psychiatry

## 2019-04-14 ENCOUNTER — Ambulatory Visit (INDEPENDENT_AMBULATORY_CARE_PROVIDER_SITE_OTHER): Payer: Medicare Other | Admitting: Psychology

## 2019-04-14 DIAGNOSIS — F3289 Other specified depressive episodes: Secondary | ICD-10-CM | POA: Diagnosis not present

## 2019-04-14 DIAGNOSIS — F4310 Post-traumatic stress disorder, unspecified: Secondary | ICD-10-CM

## 2019-04-14 DIAGNOSIS — F331 Major depressive disorder, recurrent, moderate: Secondary | ICD-10-CM

## 2019-04-19 ENCOUNTER — Other Ambulatory Visit (HOSPITAL_COMMUNITY): Payer: Self-pay | Admitting: Student

## 2019-04-19 ENCOUNTER — Ambulatory Visit (HOSPITAL_COMMUNITY)
Admission: RE | Admit: 2019-04-19 | Discharge: 2019-04-19 | Disposition: A | Discharge status: Home / Self Care | Payer: Medicare Other | Source: Ambulatory Visit | Attending: Family | Admitting: Family

## 2019-04-19 ENCOUNTER — Other Ambulatory Visit: Payer: Self-pay

## 2019-04-19 ENCOUNTER — Telehealth (HOSPITAL_COMMUNITY): Payer: Self-pay | Admitting: Rehabilitation

## 2019-04-19 DIAGNOSIS — M7989 Other specified soft tissue disorders: Secondary | ICD-10-CM

## 2019-04-19 NOTE — Progress Notes (Signed)
Performed LLE Venous Duplex. Preliminary report given to [facility] office. Patient instructed to return home. Preliminary report in Epic.

## 2019-04-19 NOTE — Telephone Encounter (Signed)

## 2019-04-20 ENCOUNTER — Other Ambulatory Visit: Payer: Self-pay

## 2019-04-20 ENCOUNTER — Encounter: Payer: Self-pay | Admitting: Internal Medicine

## 2019-04-20 ENCOUNTER — Ambulatory Visit: Payer: Medicare Other | Attending: Internal Medicine | Admitting: Internal Medicine

## 2019-04-20 DIAGNOSIS — I10 Essential (primary) hypertension: Secondary | ICD-10-CM | POA: Diagnosis not present

## 2019-04-20 DIAGNOSIS — D649 Anemia, unspecified: Secondary | ICD-10-CM | POA: Diagnosis not present

## 2019-04-20 DIAGNOSIS — Z9889 Other specified postprocedural states: Secondary | ICD-10-CM | POA: Diagnosis not present

## 2019-04-20 DIAGNOSIS — D151 Benign neoplasm of heart: Secondary | ICD-10-CM

## 2019-04-20 DIAGNOSIS — E119 Type 2 diabetes mellitus without complications: Secondary | ICD-10-CM | POA: Diagnosis not present

## 2019-04-20 NOTE — Progress Notes (Signed)
Virtual Visit via Telephone Note Due to current restrictions/limitations of in-office visits due to the COVID-19 pandemic, this scheduled clinical appointment was converted to a telehealth visit  I connected with Michele Wood on 04/20/19 at 5:00 by telephone and verified that I am speaking with the correct person using two identifiers. I am in my office.  The patient is at home.  Only the patient and myself participated in this encounter.  I discussed the limitations, risks, security and privacy concerns of performing an evaluation and management service by telephone and the availability of in person appointments. I also discussed with the patient that there may be a patient responsible charge related to this service. The patient expressed understanding and agreed to proceed.   History of Present Illness: Hx of HTN, chronic sinusitis, degen disc of c-spine, chronic LBP, vit D def, obesity, anx/dep, DM (with ? neuropathy in feetfrom DM vs lower back), dep with psychosis  Chronic LBP: Since last visit with me patient underwent surgery on the lower back.  She had lumbar decompression with fusion on 03/31/2019.  Reports she was doing okay until a few days ago she started having some pain in the left lower extremity.  They did a Doppler ultrasound to rule out DVT.  It was negative.  She was noted to have a mild anemia post surgery.  DM:  Not checking BS but reported blood sugars were good during her hospitalization last month for back surgery.  Reports appetite is been down since surgery but she does not feel like she has lost any weight. Taking Metformin as prescribed Last A1c done last month was 6.1   HTN:  No device to check Compliant with meds  No chest pains or shortness of breath.  HL:  Compliant with Lipitor  Since last visit with me, she did undergo coronary CT through Dr. Gwenlyn Found.  This revealed coronary calcium score of 26 with minimal nonobstructive CAD.  There was also note of a  large homogeneous well circumcised mass in the left atrium likely representing a myxoma.  Plan per Dr. Gwenlyn Found note was to do a TEE.  Patient states she was told they will call her in August to have it done  Outpatient Encounter Medications as of 04/20/2019  Medication Sig  . amLODipine (NORVASC) 10 MG tablet Take 1 tablet (10 mg total) by mouth daily.  Marland Kitchen aspirin EC 81 MG tablet Take 1 tablet (81 mg total) by mouth daily.  Marland Kitchen atorvastatin (LIPITOR) 40 MG tablet Take 1 tablet (40 mg total) by mouth daily.  . Cholecalciferol (VITAMIN D3) 2000 UNITS TABS Take 2,000 Units by mouth daily.  Marland Kitchen docusate sodium (COLACE) 100 MG capsule Take 1 capsule (100 mg total) by mouth 2 (two) times daily.  . DULoxetine (CYMBALTA) 60 MG capsule Take 1 capsule (60 mg total) by mouth daily.  . hydrochlorothiazide (HYDRODIURIL) 25 MG tablet Take 1 tablet (25 mg total) by mouth daily.  . hydrOXYzine (VISTARIL) 25 MG capsule Take 1 capsule (25 mg total) by mouth at bedtime as needed for anxiety.  Marland Kitchen lisinopril (PRINIVIL,ZESTRIL) 40 MG tablet Take 1 tablet (40 mg total) by mouth daily.  . Lurasidone HCl 60 MG TABS Take 1 tablet (60 mg total) by mouth daily with breakfast.  . metFORMIN (GLUCOPHAGE) 500 MG tablet Take 1 tablet (500 mg total) by mouth daily with breakfast.  . methocarbamol (ROBAXIN) 750 MG tablet Take 1 tablet (750 mg total) by mouth every 6 (six) hours as needed for muscle  spasms.  . montelukast (SINGULAIR) 10 MG tablet Take 1 tablet (10 mg total) by mouth at bedtime.  Marland Kitchen oxyCODONE 10 MG TABS Take 1 tablet (10 mg total) by mouth every 4 (four) hours as needed for severe pain ((score 7 to 10)).  Marland Kitchen triamcinolone (NASACORT AQ) 55 MCG/ACT AERO nasal inhaler Place 2 sprays into the nose daily.   No facility-administered encounter medications on file as of 04/20/2019.     Observations/Objective:   Chemistry      Component Value Date/Time   NA 139 04/01/2019 0626   NA 143 03/25/2019 1049   K 3.4 (L) 04/01/2019 0626    CL 102 04/01/2019 0626   CO2 26 04/01/2019 0626   BUN 15 04/01/2019 0626   BUN 17 03/25/2019 1049   CREATININE 0.99 04/01/2019 0626   CREATININE 0.85 04/25/2015 1602      Component Value Date/Time   CALCIUM 8.1 (L) 04/01/2019 0626   ALKPHOS 83 10/19/2018 1204   AST 13 10/19/2018 1204   ALT 14 10/19/2018 1204   BILITOT <0.2 10/19/2018 1204     Lab Results  Component Value Date   WBC 13.6 (H) 04/01/2019   HGB 10.0 (L) 04/01/2019   HCT 31.3 (L) 04/01/2019   MCV 81.1 04/01/2019   PLT 288 04/01/2019    Assessment and Plan: 1. Controlled type 2 diabetes mellitus without complication, without long-term current use of insulin (HCC) Continue metformin. If she feels she needs something to supplement meals I recommend Glucerna shake.  2. Essential hypertension Continue current medications and low-salt diet  3. Atrial myxoma Plan for TEE per cardiology note  4. S/P laminectomy  5. Anemia, unspecified type Postoperative anemia.  We will have her come to the lab in about 2 to 3 weeks to recheck the CBC to see if it has returned to normal. - CBC; Future   Follow Up Instructions: F/u in 3 mths   I discussed the assessment and treatment plan with the patient. The patient was provided an opportunity to ask questions and all were answered. The patient agreed with the plan and demonstrated an understanding of the instructions.   The patient was advised to call back or seek an in-person evaluation if the symptoms worsen or if the condition fails to improve as anticipated.  I provided 7 minutes of non-face-to-face time during this encounter.   Karle Plumber, MD

## 2019-04-20 NOTE — H&P (View-Only) (Signed)
Virtual Visit via Telephone Note Due to current restrictions/limitations of in-office visits due to the COVID-19 pandemic, this scheduled clinical appointment was converted to a telehealth visit  I connected with Michele Wood on 04/20/19 at 5:00 by telephone and verified that I am speaking with the correct person using two identifiers. I am in my office.  The patient is at home.  Only the patient and myself participated in this encounter.  I discussed the limitations, risks, security and privacy concerns of performing an evaluation and management service by telephone and the availability of in person appointments. I also discussed with the patient that there may be a patient responsible charge related to this service. The patient expressed understanding and agreed to proceed.   History of Present Illness: Hx of HTN, chronic sinusitis, degen disc of c-spine, chronic LBP, vit D def, obesity, anx/dep, DM (with ? neuropathy in feetfrom DM vs lower back), dep with psychosis  Chronic LBP: Since last visit with me patient underwent surgery on the lower back.  She had lumbar decompression with fusion on 03/31/2019.  Reports she was doing okay until a few days ago she started having some pain in the left lower extremity.  They did a Doppler ultrasound to rule out DVT.  It was negative.  She was noted to have a mild anemia post surgery.  DM:  Not checking BS but reported blood sugars were good during her hospitalization last month for back surgery.  Reports appetite is been down since surgery but she does not feel like she has lost any weight. Taking Metformin as prescribed Last A1c done last month was 6.1   HTN:  No device to check Compliant with meds  No chest pains or shortness of breath.  HL:  Compliant with Lipitor  Since last visit with me, she did undergo coronary CT through Dr. Gwenlyn Found.  This revealed coronary calcium score of 26 with minimal nonobstructive CAD.  There was also note of a  large homogeneous well circumcised mass in the left atrium likely representing a myxoma.  Plan per Dr. Gwenlyn Found note was to do a TEE.  Patient states she was told they will call her in August to have it done  Outpatient Encounter Medications as of 04/20/2019  Medication Sig  . amLODipine (NORVASC) 10 MG tablet Take 1 tablet (10 mg total) by mouth daily.  Marland Kitchen aspirin EC 81 MG tablet Take 1 tablet (81 mg total) by mouth daily.  Marland Kitchen atorvastatin (LIPITOR) 40 MG tablet Take 1 tablet (40 mg total) by mouth daily.  . Cholecalciferol (VITAMIN D3) 2000 UNITS TABS Take 2,000 Units by mouth daily.  Marland Kitchen docusate sodium (COLACE) 100 MG capsule Take 1 capsule (100 mg total) by mouth 2 (two) times daily.  . DULoxetine (CYMBALTA) 60 MG capsule Take 1 capsule (60 mg total) by mouth daily.  . hydrochlorothiazide (HYDRODIURIL) 25 MG tablet Take 1 tablet (25 mg total) by mouth daily.  . hydrOXYzine (VISTARIL) 25 MG capsule Take 1 capsule (25 mg total) by mouth at bedtime as needed for anxiety.  Marland Kitchen lisinopril (PRINIVIL,ZESTRIL) 40 MG tablet Take 1 tablet (40 mg total) by mouth daily.  . Lurasidone HCl 60 MG TABS Take 1 tablet (60 mg total) by mouth daily with breakfast.  . metFORMIN (GLUCOPHAGE) 500 MG tablet Take 1 tablet (500 mg total) by mouth daily with breakfast.  . methocarbamol (ROBAXIN) 750 MG tablet Take 1 tablet (750 mg total) by mouth every 6 (six) hours as needed for muscle  spasms.  . montelukast (SINGULAIR) 10 MG tablet Take 1 tablet (10 mg total) by mouth at bedtime.  Marland Kitchen oxyCODONE 10 MG TABS Take 1 tablet (10 mg total) by mouth every 4 (four) hours as needed for severe pain ((score 7 to 10)).  Marland Kitchen triamcinolone (NASACORT AQ) 55 MCG/ACT AERO nasal inhaler Place 2 sprays into the nose daily.   No facility-administered encounter medications on file as of 04/20/2019.     Observations/Objective:   Chemistry      Component Value Date/Time   NA 139 04/01/2019 0626   NA 143 03/25/2019 1049   K 3.4 (L) 04/01/2019 0626    CL 102 04/01/2019 0626   CO2 26 04/01/2019 0626   BUN 15 04/01/2019 0626   BUN 17 03/25/2019 1049   CREATININE 0.99 04/01/2019 0626   CREATININE 0.85 04/25/2015 1602      Component Value Date/Time   CALCIUM 8.1 (L) 04/01/2019 0626   ALKPHOS 83 10/19/2018 1204   AST 13 10/19/2018 1204   ALT 14 10/19/2018 1204   BILITOT <0.2 10/19/2018 1204     Lab Results  Component Value Date   WBC 13.6 (H) 04/01/2019   HGB 10.0 (L) 04/01/2019   HCT 31.3 (L) 04/01/2019   MCV 81.1 04/01/2019   PLT 288 04/01/2019    Assessment and Plan: 1. Controlled type 2 diabetes mellitus without complication, without long-term current use of insulin (HCC) Continue metformin. If she feels she needs something to supplement meals I recommend Glucerna shake.  2. Essential hypertension Continue current medications and low-salt diet  3. Atrial myxoma Plan for TEE per cardiology note  4. S/P laminectomy  5. Anemia, unspecified type Postoperative anemia.  We will have her come to the lab in about 2 to 3 weeks to recheck the CBC to see if it has returned to normal. - CBC; Future   Follow Up Instructions: F/u in 3 mths   I discussed the assessment and treatment plan with the patient. The patient was provided an opportunity to ask questions and all were answered. The patient agreed with the plan and demonstrated an understanding of the instructions.   The patient was advised to call back or seek an in-person evaluation if the symptoms worsen or if the condition fails to improve as anticipated.  I provided 7 minutes of non-face-to-face time during this encounter.   Karle Plumber, MD

## 2019-04-27 DIAGNOSIS — M4316 Spondylolisthesis, lumbar region: Secondary | ICD-10-CM | POA: Diagnosis not present

## 2019-04-27 DIAGNOSIS — Z6841 Body Mass Index (BMI) 40.0 and over, adult: Secondary | ICD-10-CM | POA: Diagnosis not present

## 2019-04-27 DIAGNOSIS — I10 Essential (primary) hypertension: Secondary | ICD-10-CM | POA: Diagnosis not present

## 2019-04-30 ENCOUNTER — Ambulatory Visit (INDEPENDENT_AMBULATORY_CARE_PROVIDER_SITE_OTHER): Payer: Medicare Other | Admitting: Psychology

## 2019-04-30 DIAGNOSIS — F3289 Other specified depressive episodes: Secondary | ICD-10-CM

## 2019-05-07 ENCOUNTER — Other Ambulatory Visit: Payer: Self-pay

## 2019-05-07 ENCOUNTER — Encounter: Payer: Self-pay | Admitting: Podiatry

## 2019-05-07 ENCOUNTER — Ambulatory Visit (INDEPENDENT_AMBULATORY_CARE_PROVIDER_SITE_OTHER): Payer: Medicare Other | Admitting: Podiatry

## 2019-05-07 DIAGNOSIS — E119 Type 2 diabetes mellitus without complications: Secondary | ICD-10-CM | POA: Diagnosis not present

## 2019-05-07 DIAGNOSIS — M79674 Pain in right toe(s): Secondary | ICD-10-CM | POA: Diagnosis not present

## 2019-05-07 DIAGNOSIS — L84 Corns and callosities: Secondary | ICD-10-CM

## 2019-05-07 DIAGNOSIS — B351 Tinea unguium: Secondary | ICD-10-CM

## 2019-05-07 DIAGNOSIS — M79675 Pain in left toe(s): Secondary | ICD-10-CM | POA: Diagnosis not present

## 2019-05-07 NOTE — Patient Instructions (Signed)
Diabetes Mellitus and Foot Care Foot care is an important part of your health, especially when you have diabetes. Diabetes may cause you to have problems because of poor blood flow (circulation) to your feet and legs, which can cause your skin to:  Become thinner and drier.  Break more easily.  Heal more slowly.  Peel and crack. You may also have nerve damage (neuropathy) in your legs and feet, causing decreased feeling in them. This means that you may not notice minor injuries to your feet that could lead to more serious problems. Noticing and addressing any potential problems early is the best way to prevent future foot problems. How to care for your feet Foot hygiene  Wash your feet daily with warm water and mild soap. Do not use hot water. Then, pat your feet and the areas between your toes until they are completely dry. Do not soak your feet as this can dry your skin.  Trim your toenails straight across. Do not dig under them or around the cuticle. File the edges of your nails with an emery board or nail file.  Apply a moisturizing lotion or petroleum jelly to the skin on your feet and to dry, brittle toenails. Use lotion that does not contain alcohol and is unscented. Do not apply lotion between your toes. Shoes and socks  Wear clean socks or stockings every day. Make sure they are not too tight. Do not wear knee-high stockings since they may decrease blood flow to your legs.  Wear shoes that fit properly and have enough cushioning. Always look in your shoes before you put them on to be sure there are no objects inside.  To break in new shoes, wear them for just a few hours a day. This prevents injuries on your feet. Wounds, scrapes, corns, and calluses  Check your feet daily for blisters, cuts, bruises, sores, and redness. If you cannot see the bottom of your feet, use a mirror or ask someone for help.  Do not cut corns or calluses or try to remove them with medicine.  If you  find a minor scrape, cut, or break in the skin on your feet, keep it and the skin around it clean and dry. You may clean these areas with mild soap and water. Do not clean the area with peroxide, alcohol, or iodine.  If you have a wound, scrape, corn, or callus on your foot, look at it several times a day to make sure it is healing and not infected. Check for: ? Redness, swelling, or pain. ? Fluid or blood. ? Warmth. ? Pus or a bad smell. General instructions  Do not cross your legs. This may decrease blood flow to your feet.  Do not use heating pads or hot water bottles on your feet. They may burn your skin. If you have lost feeling in your feet or legs, you may not know this is happening until it is too late.  Protect your feet from hot and cold by wearing shoes, such as at the beach or on hot pavement.  Schedule a complete foot exam at least once a year (annually) or more often if you have foot problems. If you have foot problems, report any cuts, sores, or bruises to your health care provider immediately. Contact a health care provider if:  You have a medical condition that increases your risk of infection and you have any cuts, sores, or bruises on your feet.  You have an injury that is not   healing.  You have redness on your legs or feet.  You feel burning or tingling in your legs or feet.  You have pain or cramps in your legs and feet.  Your legs or feet are numb.  Your feet always feel cold.  You have pain around a toenail. Get help right away if:  You have a wound, scrape, corn, or callus on your foot and: ? You have pain, swelling, or redness that gets worse. ? You have fluid or blood coming from the wound, scrape, corn, or callus. ? Your wound, scrape, corn, or callus feels warm to the touch. ? You have pus or a bad smell coming from the wound, scrape, corn, or callus. ? You have a fever. ? You have a red line going up your leg. Summary  Check your feet every day  for cuts, sores, red spots, swelling, and blisters.  Moisturize feet and legs daily.  Wear shoes that fit properly and have enough cushioning.  If you have foot problems, report any cuts, sores, or bruises to your health care provider immediately.  Schedule a complete foot exam at least once a year (annually) or more often if you have foot problems. This information is not intended to replace advice given to you by your health care provider. Make sure you discuss any questions you have with your health care provider. Document Released: 09/27/2000 Document Revised: 11/12/2017 Document Reviewed: 11/01/2016 Elsevier Patient Education  2020 [employer]   Onychomycosis/Fungal Toenails  WHAT IS IT? An infection that lies within the keratin of your nail plate that is caused by a fungus.  WHY ME? Fungal infections affect all ages, sexes, races, and creeds.  There may be many factors that predispose you to a fungal infection such as age, coexisting medical conditions such as diabetes, or an autoimmune disease; stress, medications, fatigue, genetics, etc.  Bottom line: fungus thrives in a warm, moist environment and your shoes offer such a location.  IS IT CONTAGIOUS? Theoretically, yes.  You do not want to share shoes, nail clippers or files with someone who has fungal toenails.  Walking around barefoot in the same room or sleeping in the same bed is unlikely to transfer the organism.  It is important to realize, however, that fungus can spread easily from one nail to the next on the same foot.  HOW DO WE TREAT THIS?  There are several ways to treat this condition.  Treatment may depend on many factors such as age, medications, pregnancy, liver and kidney conditions, etc.  It is best to ask your doctor which options are available to you.  1. No treatment.   Unlike many other medical concerns, you can live with this condition.  However for many people this can be a painful condition and may lead to  ingrown toenails or a bacterial infection.  It is recommended that you keep the nails cut short to help reduce the amount of fungal nail. 2. Topical treatment.  These range from herbal remedies to prescription strength nail lacquers.  About 40-50% effective, topicals require twice daily application for approximately 9 to 12 months or until an entirely new nail has grown out.  The most effective topicals are medical grade medications available through physicians offices. 3. Oral antifungal medications.  With an 80-90% cure rate, the most common oral medication requires 3 to 4 months of therapy and stays in your system for a year as the new nail grows out.  Oral antifungal medications do require   blood work to make sure it is a safe drug for you.  A liver function panel will be performed prior to starting the medication and after the first month of treatment.  It is important to have the blood work performed to avoid any harmful side effects.  In general, this medication safe but blood work is required. 4. Laser Therapy.  This treatment is performed by applying a specialized laser to the affected nail plate.  This therapy is noninvasive, fast, and non-painful.  It is not covered by insurance and is therefore, out of pocket.  The results have been very good with a 80-95% cure rate.  [facility] is the only practice in the area to offer this therapy. 5. Permanent Nail Avulsion.  Removing the entire nail so that a new nail will not grow back. 

## 2019-05-09 NOTE — Progress Notes (Signed)
Subjective: Michele Wood presents to clinic for preventative diabetic foot care with cc of painful mycotic toenails calluses b/l hallux which are aggravated when weightbearing with and without shoe gear.  This pain limits her daily activities. Pain symptoms resolve with periodic professional debridement.  Ladell Pier, MD is her PCP. Last visit was 04/20/2019.   Current Outpatient Medications:  .  amLODipine (NORVASC) 10 MG tablet, Take 1 tablet (10 mg total) by mouth daily., Disp: 90 tablet, Rfl: 3 .  aspirin EC 81 MG tablet, Take 1 tablet (81 mg total) by mouth daily., Disp: 100 tablet, Rfl: 1 .  atorvastatin (LIPITOR) 40 MG tablet, Take 1 tablet (40 mg total) by mouth daily., Disp: 90 tablet, Rfl: 3 .  Cholecalciferol (VITAMIN D3) 2000 UNITS TABS, Take 2,000 Units by mouth daily., Disp: 30 tablet, Rfl: 11 .  docusate sodium (COLACE) 100 MG capsule, Take 1 capsule (100 mg total) by mouth 2 (two) times daily., Disp: 60 capsule, Rfl: 0 .  DULoxetine (CYMBALTA) 60 MG capsule, Take 1 capsule (60 mg total) by mouth daily., Disp: 30 capsule, Rfl: 1 .  hydrochlorothiazide (HYDRODIURIL) 25 MG tablet, Take 1 tablet (25 mg total) by mouth daily., Disp: 90 tablet, Rfl: 3 .  hydrOXYzine (VISTARIL) 25 MG capsule, Take 1 capsule (25 mg total) by mouth at bedtime as needed for anxiety., Disp: 30 capsule, Rfl: 1 .  lisinopril (PRINIVIL,ZESTRIL) 40 MG tablet, Take 1 tablet (40 mg total) by mouth daily., Disp: 90 tablet, Rfl: 3 .  Lurasidone HCl 60 MG TABS, Take 1 tablet (60 mg total) by mouth daily with breakfast., Disp: 30 tablet, Rfl: 1 .  metFORMIN (GLUCOPHAGE) 500 MG tablet, Take 1 tablet (500 mg total) by mouth daily with breakfast., Disp: 90 tablet, Rfl: 3 .  methocarbamol (ROBAXIN) 750 MG tablet, Take 1 tablet (750 mg total) by mouth every 6 (six) hours as needed for muscle spasms., Disp: 50 tablet, Rfl: 1 .  montelukast (SINGULAIR) 10 MG tablet, Take 1 tablet (10 mg total) by mouth at  bedtime., Disp: 30 tablet, Rfl: 3 .  oxyCODONE 10 MG TABS, Take 1 tablet (10 mg total) by mouth every 4 (four) hours as needed for severe pain ((score 7 to 10))., Disp: 30 tablet, Rfl: 0 .  triamcinolone (NASACORT AQ) 55 MCG/ACT AERO nasal inhaler, Place 2 sprays into the nose daily., Disp: 1 Inhaler, Rfl: 12   Allergies  Allergen Reactions  . Codeine Sulfate Rash  . Milk-Related Compounds Diarrhea and Other (See Comments)    Diarrhea and stomach upset     Objective:  Physical Examination:  Vascular  Examination: Capillary refill time immediate x 10 digits.  Palpable DP/PT pulses b/l.  Digital hair sparse b/l.  No edema noted b/l.  Skin temperature gradient WNL b/l.  Dermatological Examination: Skin with normal turgor, texture and tone b/l.  No open wounds b/l.  No interdigital macerations noted b/l.  Elongated, thick, discolored brittle toenails with subungual debris and pain on dorsal palpation of nailbeds 1-5 b/l.  Hyperkeratotic lesion b/l hallux with tenderness to palpation. No edema, no erythema, no drainage, no flocculence.  Musculoskeletal Examination: Muscle strength 5/5 to all muscle groups b/l.  No pain, crepitus or joint discomfort with active/passive ROM.  Neurological Examination: Sensation intact 5/5 b/l with 10 gram monofilament.  Vibratory sensation intact b/l.  Proprioceptive sensation intact b/l.  Assessment: 1. Mycotic nail infection with pain 1-5 b/l 2. Callus b/l hallux 3. NIDDM  Plan: 1. Toenails 1-5 b/l were  debrided in length and girth without iatrogenic laceration. 2. Calluses pared b/l hallux utilizing sterile scalpel blade without incident. 3. Continue soft, supportive shoe gear daily. 4. Report any pedal injuries to medical professional. 5. Follow up 3 months. 6. Patient/POA to call should there be a question/concern in there interim.

## 2019-05-10 ENCOUNTER — Telehealth: Payer: Self-pay

## 2019-05-10 ENCOUNTER — Other Ambulatory Visit: Payer: Self-pay | Admitting: Internal Medicine

## 2019-05-10 DIAGNOSIS — D151 Benign neoplasm of heart: Secondary | ICD-10-CM

## 2019-05-10 DIAGNOSIS — R079 Chest pain, unspecified: Secondary | ICD-10-CM

## 2019-05-10 DIAGNOSIS — Z01812 Encounter for preprocedural laboratory examination: Secondary | ICD-10-CM

## 2019-05-10 NOTE — Telephone Encounter (Signed)
Contacted pt to remind her of upcoming TEE on 8/3 and advised to arrive at [facility] at 8:30am. Also updated pt regarding update on location, date, and time for COVID-19 test. Pt advised of the following:  Go to:  [facility] at [facility] [id number], [place], [place] [address] FOR YOUR BASIC METABOLIC PANEL AND COMPLETE BLOOD COUNT. NO APPOINTMENT IS NEEDED. YOU MUST HAVE THIS LAB WORK DONE BEFORE GOING TO GET YOUR COVID-19 TEST DONE BECAUSE YOU WILL NEED TO QUARANTINE YOURSELF AFTER THE COVID-19 TEST UNTIL THE DAY OF YOUR [place].   Go to: [facility] [address], [place] [address] FOR YOUR COVID-19 TEST. YOU MUST HAVE YOUR COVID-19 TEST COMPLETED 4 DAYS PRIOR TO YOUR UPCOMING PROCEDURE/TEST. YOU WILL NEED AN APPOINTMENT. YOUR APPOINTMENT IS ON 05/13/2019 AT 1:30 PM. YOU WILL ALSO NEED TO QUARANTINE YOURSELF AFTER THE COVID-19 TEST UNTIL THE DAY OF YOUR PROCEDURE/TEST. RESULTS WILL  BE POSTED IN OUR SYSTEM IN 48 HOURS OR LESS.  Advised pt to share with the first checkpoint that she is there for pre-procedure testing. Pt verbalized understanding.

## 2019-05-13 ENCOUNTER — Other Ambulatory Visit (HOSPITAL_COMMUNITY)
Admission: RE | Admit: 2019-05-13 | Discharge: 2019-05-13 | Disposition: A | Discharge status: Home / Self Care | Payer: Medicare Other | Source: Ambulatory Visit | Attending: Internal Medicine | Admitting: Internal Medicine

## 2019-05-13 DIAGNOSIS — E782 Mixed hyperlipidemia: Secondary | ICD-10-CM | POA: Diagnosis not present

## 2019-05-13 DIAGNOSIS — Z20828 Contact with and (suspected) exposure to other viral communicable diseases: Secondary | ICD-10-CM | POA: Insufficient documentation

## 2019-05-13 DIAGNOSIS — D151 Benign neoplasm of heart: Secondary | ICD-10-CM | POA: Diagnosis not present

## 2019-05-13 LAB — SARS CORONAVIRUS 2 (TAT 6-24 HRS): SARS Coronavirus 2: NEGATIVE

## 2019-05-14 ENCOUNTER — Ambulatory Visit (INDEPENDENT_AMBULATORY_CARE_PROVIDER_SITE_OTHER): Payer: Medicare Other | Admitting: Psychology

## 2019-05-14 ENCOUNTER — Other Ambulatory Visit (HOSPITAL_COMMUNITY): Payer: Medicare Other

## 2019-05-14 DIAGNOSIS — F3289 Other specified depressive episodes: Secondary | ICD-10-CM | POA: Diagnosis not present

## 2019-05-14 LAB — LIPID PANEL
Chol/HDL Ratio: 4.5 ratio — ABNORMAL HIGH (ref 0.0–4.4)
Cholesterol, Total: 154 mg/dL (ref 100–199)
HDL: 34 mg/dL — ABNORMAL LOW (ref >39)
LDL Calculated: 82 mg/dL (ref 0–99)
Triglycerides: 191 mg/dL — ABNORMAL HIGH (ref 0–149)
VLDL Cholesterol Cal: 38 mg/dL (ref 5–40)

## 2019-05-14 LAB — CBC
Hematocrit: 33 % — ABNORMAL LOW (ref 34.0–46.6)
Hemoglobin: 10.7 g/dL — ABNORMAL LOW (ref 11.1–15.9)
MCH: 24.8 pg — ABNORMAL LOW (ref 26.6–33.0)
MCHC: 32.4 g/dL (ref 31.5–35.7)
MCV: 76 fL — ABNORMAL LOW (ref 79–97)
Platelets: 346 10*3/uL (ref 150–450)
RBC: 4.32 x10E6/uL (ref 3.77–5.28)
RDW: 13.5 % (ref 11.7–15.4)
WBC: 7.2 10*3/uL (ref 3.4–10.8)

## 2019-05-14 LAB — HEPATIC FUNCTION PANEL
ALT: 12 IU/L (ref 0–32)
AST: 12 IU/L (ref 0–40)
Albumin: 3.8 g/dL (ref 3.8–4.9)
Alkaline Phosphatase: 110 IU/L (ref 39–117)
Bilirubin Total: 0.3 mg/dL (ref 0.0–1.2)
Bilirubin, Direct: 0.1 mg/dL (ref 0.00–0.40)
Total Protein: 6.9 g/dL (ref 6.0–8.5)

## 2019-05-14 LAB — BASIC METABOLIC PANEL
BUN/Creatinine Ratio: 10 (ref 9–23)
BUN: 9 mg/dL (ref 6–24)
CO2: 24 mmol/L (ref 20–29)
Calcium: 9.1 mg/dL (ref 8.7–10.2)
Chloride: 104 mmol/L (ref 96–106)
Creatinine, Ser: 0.88 mg/dL (ref 0.57–1.00)
GFR calc Af Amer: 85 mL/min/{1.73_m2} (ref >59)
GFR calc non Af Amer: 74 mL/min/{1.73_m2} (ref >59)
Glucose: 91 mg/dL (ref 65–99)
Potassium: 3.8 mmol/L (ref 3.5–5.2)
Sodium: 144 mmol/L (ref 134–144)

## 2019-05-14 NOTE — Progress Notes (Signed)
Called to remind patient to stay quarantined at home through the weekend, call if have any symptoms.

## 2019-05-17 ENCOUNTER — Encounter (HOSPITAL_COMMUNITY)
Admission: RE | Disposition: A | Discharge status: Home / Self Care | Payer: Medicare Other | Source: Home / Self Care | Attending: Cardiovascular Disease

## 2019-05-17 ENCOUNTER — Encounter (HOSPITAL_COMMUNITY): Payer: Self-pay | Admitting: Cardiovascular Disease

## 2019-05-17 ENCOUNTER — Other Ambulatory Visit: Payer: Self-pay

## 2019-05-17 ENCOUNTER — Ambulatory Visit (HOSPITAL_COMMUNITY)
Admission: RE | Admit: 2019-05-17 | Discharge: 2019-05-17 | Disposition: A | Discharge status: Home / Self Care | Payer: Medicare Other | Attending: Cardiovascular Disease | Admitting: Cardiovascular Disease

## 2019-05-17 ENCOUNTER — Telehealth: Payer: Self-pay | Admitting: Internal Medicine

## 2019-05-17 ENCOUNTER — Ambulatory Visit (HOSPITAL_BASED_OUTPATIENT_CLINIC_OR_DEPARTMENT_OTHER)
Admission: RE | Admit: 2019-05-17 | Discharge: 2019-05-17 | Disposition: A | Discharge status: Home / Self Care | Payer: Medicare Other | Source: Home / Self Care | Attending: Cardiovascular Disease | Admitting: Cardiovascular Disease

## 2019-05-17 DIAGNOSIS — D151 Benign neoplasm of heart: Secondary | ICD-10-CM

## 2019-05-17 DIAGNOSIS — Z79899 Other long term (current) drug therapy: Secondary | ICD-10-CM | POA: Diagnosis not present

## 2019-05-17 DIAGNOSIS — I2510 Atherosclerotic heart disease of native coronary artery without angina pectoris: Secondary | ICD-10-CM | POA: Insufficient documentation

## 2019-05-17 DIAGNOSIS — I10 Essential (primary) hypertension: Secondary | ICD-10-CM | POA: Diagnosis not present

## 2019-05-17 DIAGNOSIS — Z7982 Long term (current) use of aspirin: Secondary | ICD-10-CM | POA: Diagnosis not present

## 2019-05-17 DIAGNOSIS — Z6841 Body Mass Index (BMI) 40.0 and over, adult: Secondary | ICD-10-CM | POA: Insufficient documentation

## 2019-05-17 DIAGNOSIS — E119 Type 2 diabetes mellitus without complications: Secondary | ICD-10-CM | POA: Diagnosis not present

## 2019-05-17 DIAGNOSIS — E669 Obesity, unspecified: Secondary | ICD-10-CM | POA: Diagnosis not present

## 2019-05-17 DIAGNOSIS — E785 Hyperlipidemia, unspecified: Secondary | ICD-10-CM | POA: Insufficient documentation

## 2019-05-17 DIAGNOSIS — I340 Nonrheumatic mitral (valve) insufficiency: Secondary | ICD-10-CM | POA: Diagnosis not present

## 2019-05-17 DIAGNOSIS — D649 Anemia, unspecified: Secondary | ICD-10-CM | POA: Insufficient documentation

## 2019-05-17 DIAGNOSIS — Z7984 Long term (current) use of oral hypoglycemic drugs: Secondary | ICD-10-CM | POA: Diagnosis not present

## 2019-05-17 HISTORY — PX: TEE WITHOUT CARDIOVERSION: SHX5443

## 2019-05-17 LAB — GLUCOSE, CAPILLARY: Glucose-Capillary: 107 mg/dL — ABNORMAL HIGH (ref 70–99)

## 2019-05-17 SURGERY — ECHOCARDIOGRAM, TRANSESOPHAGEAL
Anesthesia: Moderate Sedation

## 2019-05-17 MED ORDER — FENTANYL CITRATE (PF) 100 MCG/2ML IJ SOLN
INTRAMUSCULAR | Status: DC | PRN
  Administered 2019-05-17 (×2): 25 ug via INTRAVENOUS

## 2019-05-17 MED ORDER — BUTAMBEN-TETRACAINE-BENZOCAINE 2-2-14 % EX AERO
INHALATION_SPRAY | CUTANEOUS | Status: DC | PRN
  Administered 2019-05-17: 2 via TOPICAL

## 2019-05-17 MED ORDER — MIDAZOLAM HCL (PF) 5 MG/ML IJ SOLN
INTRAMUSCULAR | Status: AC
  Filled 2019-05-17: qty 2

## 2019-05-17 MED ORDER — DIPHENHYDRAMINE HCL 50 MG/ML IJ SOLN
INTRAMUSCULAR | Status: AC
  Filled 2019-05-17: qty 1

## 2019-05-17 MED ORDER — LACTATED RINGERS IV SOLN
INTRAVENOUS | Status: DC
  Administered 2019-05-17: 09:00:00 via INTRAVENOUS

## 2019-05-17 MED ORDER — FENTANYL CITRATE (PF) 100 MCG/2ML IJ SOLN
INTRAMUSCULAR | Status: AC
  Filled 2019-05-17: qty 2

## 2019-05-17 MED ORDER — MIDAZOLAM HCL (PF) 10 MG/2ML IJ SOLN
INTRAMUSCULAR | Status: DC | PRN
  Administered 2019-05-17 (×2): 2 mg via INTRAVENOUS

## 2019-05-17 MED ORDER — SODIUM CHLORIDE 0.9 % IV SOLN: INTRAVENOUS | Status: DC

## 2019-05-17 NOTE — Discharge Instructions (Signed)

## 2019-05-17 NOTE — Progress Notes (Signed)
  Echocardiogram Echocardiogram Transesophageal has been performed.  Michele Wood L Androw 05/17/2019, 10:12 AM

## 2019-05-17 NOTE — Telephone Encounter (Signed)
-----   Message from Ladell Pier, MD sent at 05/17/2019  8:10 AM EDT ----- Needs appt to eval anemia.

## 2019-05-17 NOTE — CV Procedure (Signed)
    Transesophageal Echocardiogram Note  Michele Wood [id number] [birth year]  Procedure: Transesophageal Echocardiogram Indications: left atrial myxoma  Procedure Details Consent: Obtained Time Out: Verified patient identification, verified procedure, site/side was marked, verified correct patient position, special equipment/implants available, Radiology Safety Procedures followed,  medications/allergies/relevent history reviewed, required imaging and test results available.  Performed  Medications:  During this procedure the patient is administered a total of Versed 4  mg and Fentanyl  40  mcg  to achieve and maintain moderate conscious sedation.  The patient's heart rate, blood pressure, and oxygen saturation are monitored continuously during the procedure. The period of conscious sedation is  30  minutes, of which I was present face-to-face 100% of this time.  Left Ventrical:  Normal    Mitral Valve:  Mild MR   Aortic Valve: normal   Tricuspid Valve: normal   Pulmonic Valve: poorly visualized   Left Atrium/ Left atrial appendage:  LAA was not visualized .  There is a moderate - large Left atrial mass.  Round,  Appears to be tethered with a stalk near the fossa ovalis.   Not very mobile.   There is evidence of vascular flow into and out of the mass.    Atrial septum: no PFO by color flow   Aorta: normal    Complications: No apparent complications Patient did tolerate procedure well.   Thayer Headings, Brooke Bonito., MD, [facility] 05/17/2019, 9:49 AM

## 2019-05-17 NOTE — Telephone Encounter (Signed)
Attempted to contact patient no answer unable to LVM

## 2019-05-17 NOTE — Interval H&P Note (Signed)
History and Physical Interval Note:  05/17/2019 9:23 AM  Michele Wood  has presented today for surgery, with the diagnosis of LEFT ATRIAL MYXOMA.  The various methods of treatment have been discussed with the patient and family. After consideration of risks, benefits and other options for treatment, the patient has consented to  Procedure(s): TRANSESOPHAGEAL ECHOCARDIOGRAM (TEE) (N/A) as a surgical intervention.  The patient's history has been reviewed, patient examined, no change in status, stable for surgery.  I have reviewed the patient's chart and labs.  Questions were answered to the patient's satisfaction.     Mertie Moores

## 2019-05-18 ENCOUNTER — Other Ambulatory Visit (HOSPITAL_COMMUNITY): Payer: Self-pay | Admitting: Psychiatry

## 2019-05-18 DIAGNOSIS — F4310 Post-traumatic stress disorder, unspecified: Secondary | ICD-10-CM

## 2019-05-18 DIAGNOSIS — F331 Major depressive disorder, recurrent, moderate: Secondary | ICD-10-CM

## 2019-05-19 ENCOUNTER — Telehealth: Payer: Self-pay | Admitting: Internal Medicine

## 2019-05-19 NOTE — Telephone Encounter (Signed)
Attempted to call patient today to go over the results of echo that was done yesterday.  Patient's phone rang with no answer.  TEE confirms stalk-like mass in the left atrium that seems consistent with atrial myxoma.  The cardiologist Dr. Acie Fredrickson has tried to reach her to let her know that removal is recommended.  He has referred her to [place] who will be calling the patient.  I will send her a letter.

## 2019-05-20 ENCOUNTER — Other Ambulatory Visit: Payer: Self-pay

## 2019-05-20 ENCOUNTER — Other Ambulatory Visit: Payer: Self-pay | Admitting: *Deleted

## 2019-05-20 ENCOUNTER — Encounter: Payer: Self-pay | Admitting: Thoracic Surgery (Cardiothoracic Vascular Surgery)

## 2019-05-20 ENCOUNTER — Institutional Professional Consult (permissible substitution) (INDEPENDENT_AMBULATORY_CARE_PROVIDER_SITE_OTHER): Payer: Medicare Other | Admitting: Thoracic Surgery (Cardiothoracic Vascular Surgery)

## 2019-05-20 VITALS — BP 124/88 | HR 83 | Temp 97.5°F | Resp 20 | Ht 62.0 in | Wt 230.0 lb

## 2019-05-20 DIAGNOSIS — I209 Angina pectoris, unspecified: Secondary | ICD-10-CM | POA: Diagnosis not present

## 2019-05-20 DIAGNOSIS — D151 Benign neoplasm of heart: Secondary | ICD-10-CM

## 2019-05-20 DIAGNOSIS — D219 Benign neoplasm of connective and other soft tissue, unspecified: Secondary | ICD-10-CM

## 2019-05-20 NOTE — Progress Notes (Signed)
[address]       [place] [address]             [phone]     CARDIOTHORACIC SURGERY CONSULTATION REPORT  Referring Provider is Nahser, Wonda Cheng, MD Primary Cardiologist is Michele Burow, MD PCP is Michele Pier, MD  Chief Complaint  Patient presents with   Consult    Surgical eval for LA Myxoma, Cardiac CT 02/11/19, TEE 05/17/19    HPI:  Patient is a 58 year old morbidly obese female with history of hypertension, obstructive sleep apnea, degenerative disc disease of the cervical and lumbar spine status post recent spine surgery, and type 2 diabetes mellitus without complications who has been referred for surgical consultation to discuss treatment options for management of recently discovered left atrial mass 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  Patient has long history of stable symptoms of exertional shortness of breath but more recently has experienced symptoms of exertional chest tightness.  She also has had problems with chronic back pain and weakness in her legs for which she was evaluated by Dr. Arnoldo Wood from neurosurgery.  She was referred for preoperative cardiac clearance and evaluated by Dr. Gwenlyn Wood in March of this year.  Prior to that she had been seen in the emergency department in early February for accelerated symptoms of chest pain.  She ruled out for myocardial infarction at that time.  She was noted to have left bundle branch block on baseline EKG.  She underwent transthoracic echocardiogram December 28, 2018 which revealed mildly reduced left ventricular systolic function with ejection fraction estimated 45 to 50%.  Left ventricular chamber size was notably small.  There were signs suggestive of significant diastolic dysfunction.  No valvular abnormalities were noted.  Cardiac gated CT angiogram of the heart was performed February 11, 2019.  This revealed coronary calcium score of 26 with minimal nonobstructive coronary artery disease.  The  patient was also noted to have a relatively large, homogeneous, well-circumscribed mass in the left adherent to the fossa ovalis adherent to the intra-atrial septum with findings consistent with likely left atrial myxoma.  The patient was cleared for surgery and subsequently underwent spine surgery by Dr. Arnoldo Wood including bilateral L3-4 and L4-5 nerve root decompression and fusion.  She has recovered from this procedure without complication and subsequently underwent transesophageal echocardiogram 05/17/2019 confirming the presence of a 2.3 x 1 7 cm mass in the left atrium adherent to the fossa ovalis, consistent with atrial myxoma.  Cardiothoracic surgical consultation was requested.  Patient is married and lives with her husband in [place].  She has been disabled for the past 2 years because of problems with arthritis and degenerative disc disease in her neck and spine.  She previously worked in [employer].  She has 2 adult children and 3 grandchildren.  She lives a sedentary lifestyle.  Her mobility is limited by chronic pain, weakness, and some instability of gait.  She ambulates using a cane.  She has been obese all of her adult life.  She describes stable symptoms of exertional shortness of breath that have not changed for the past several years.  She has had some intermittent episodes of tightness across her chest that seem to be brought on with exertion and relieved by rest.  Symptoms of chest tightness are not necessarily associated with exertion.  She reports occasional slight dizzy spells and occasional palpitations.  She denies any PND, orthopnea, or lower extremity edema.  She denies  any fevers, chills, or weight loss.  She states that she feels as though she has recovered fairly well from her recent spine surgery.    Past Medical History:  Diagnosis Date   Anginal pain (HCC)    Chronic back pain    DDD (degenerative disc disease), cervical    Depression    Diabetes mellitus  without complication (HCC)    Dyspnea    Hypertension    at age 33   Obesity    Sinusitis    Sleep apnea    at age 58    Past Surgical History:  Procedure Laterality Date   ABDOMINAL HYSTERECTOMY     BREAST BIOPSY Right 07/02/2006   [place] Core benign   NASAL SINUS SURGERY     TEE WITHOUT CARDIOVERSION N/A 05/17/2019   Procedure: TRANSESOPHAGEAL ECHOCARDIOGRAM (TEE);  Surgeon: Michele Wood, Wonda Cheng, MD;  Location: [facility] ENDOSCOPY;  Service: Cardiovascular;  Laterality: N/A;    Family History  Problem Relation Age of Onset   Hypertension Mother    Hypertension Other    Schizophrenia Brother     Social History   Socioeconomic History   Marital status: Married    Spouse name: Michele Wood   Number of children: 2   Years of education: Not on file   Highest education level: Some college, no degree  Occupational History   Not on file  Social Needs   Financial resource strain: Very hard   Food insecurity    Worry: Often true    Inability: Often true   Transportation needs    Medical: No    Non-medical: No  Tobacco Use   Smoking status: Never Smoker   Smokeless tobacco: Never Used  Substance and Sexual Activity   Alcohol use: Yes    Alcohol/week: 0.0 standard drinks    Comment: rare   Drug use: No   Sexual activity: Not Currently  Lifestyle   Physical activity    Days per week: 7 days    Minutes per session: 60 min   Stress: Very much  Relationships   Social connections    Talks on phone: Three times a week    Gets together: Not on file    Attends religious service: Not on file    Active member of club or organization: Not on file    Attends meetings of clubs or organizations: Not on file    Relationship status: Not on file   Intimate partner violence    Fear of current or ex partner: No    Emotionally abused: No    Physically abused: No    Forced sexual activity: No  Other Topics Concern   Not on file  Social History Narrative   Not on file     Current Outpatient Medications  Medication Sig Dispense Refill   amLODipine (NORVASC) 10 MG tablet Take 1 tablet (10 mg total) by mouth daily. 90 tablet 3   aspirin 81 MG EC tablet TAKE 1 TABLET BY MOUTH EVERY DAY (Patient taking differently: Take 81 mg by mouth daily. ) 100 tablet 1   atorvastatin (LIPITOR) 40 MG tablet Take 1 tablet (40 mg total) by mouth daily. 90 tablet 3   Cholecalciferol (VITAMIN D3) 2000 UNITS TABS Take 2,000 Units by mouth daily. (Patient taking differently: Take 2,000 Units by mouth 2 (two) times a week. ) 30 tablet 11   DULoxetine (CYMBALTA) 60 MG capsule Take 1 capsule (60 mg total) by mouth daily. 30 capsule 1   hydrochlorothiazide (HYDRODIURIL)  25 MG tablet Take 1 tablet (25 mg total) by mouth daily. 90 tablet 3   hydrOXYzine (VISTARIL) 25 MG capsule Take 1 capsule (25 mg total) by mouth at bedtime as needed for anxiety. (Patient taking differently: Take 25 mg by mouth daily. ) 30 capsule 1   lisinopril (PRINIVIL,ZESTRIL) 40 MG tablet Take 1 tablet (40 mg total) by mouth daily. 90 tablet 3   Lurasidone HCl 60 MG TABS Take 1 tablet (60 mg total) by mouth daily with breakfast. (Patient taking differently: Take 60 mg by mouth every evening. ) 30 tablet 1   metFORMIN (GLUCOPHAGE) 500 MG tablet Take 1 tablet (500 mg total) by mouth daily with breakfast. 90 tablet 3   methocarbamol (ROBAXIN) 750 MG tablet Take 1 tablet (750 mg total) by mouth every 6 (six) hours as needed for muscle spasms. 50 tablet 1   montelukast (SINGULAIR) 10 MG tablet Take 1 tablet (10 mg total) by mouth at bedtime. (Patient taking differently: Take 10 mg by mouth daily. ) 30 tablet 3   oxyCODONE 10 MG TABS Take 1 tablet (10 mg total) by mouth every 4 (four) hours as needed for severe pain ((score 7 to 10)). 30 tablet 0   triamcinolone (NASACORT AQ) 55 MCG/ACT AERO nasal inhaler Place 2 sprays into the nose daily. 1 Inhaler 12   No current facility-administered medications for this  visit.     Allergies  Allergen Reactions   Codeine Sulfate Rash   Milk-Related Compounds Diarrhea and Other (See Comments)    Diarrhea and stomach upset      Review of Systems:   General:  decreased appetite, no energy, no weight gain, no weight loss, no fever  Cardiac:  + chest pain with exertion, no chest pain at rest, +SOB with exertion, no resting SOB, no PND, no orthopnea, + palpitations, no arrhythmia, no atrial fibrillation, no LE edema, + dizzy spells, no syncope  Respiratory:  no shortness of breath, no home oxygen, no productive cough, no dry cough, no bronchitis, no wheezing, no hemoptysis, no asthma, no pain with inspiration or cough, no sleep apnea, no CPAP at night  GI:   some difficulty swallowing which she attributes to pain in her neck, no reflux, no frequent heartburn, no hiatal hernia, no abdominal pain, no constipation, no diarrhea, no hematochezia, no hematemesis, no melena  GU:   no dysuria,  no frequency, no urinary tract infection, no hematuria, no kidney stones, no kidney disease  Vascular:  no pain suggestive of claudication, no pain in feet, no leg cramps, no varicose veins, no DVT, no non-healing foot ulcer  Neuro:   no stroke, no TIA's, no seizures, no headaches, + occasional temporary blindness one eye,  no slurred speech, + peripheral neuropathy, + chronic pain, + instability of gait, no memory/cognitive dysfunction  Musculoskeletal: + arthritis, no joint swelling, no myalgias, + difficulty walking, limited mobility   Skin:   no rash, no itching, no skin infections, no pressure sores or ulcerations  Psych:   + anxiety, + depression, no nervousness, no unusual recent stress  Eyes:   + blurry vision, no floaters, no recent vision changes, + wears glasses or contacts  ENT:   no hearing loss, no loose or painful teeth, no dentures, last saw dentist within the past year  Hematologic:  no easy bruising, no abnormal bleeding, no clotting disorder, no frequent  epistaxis  Endocrine:  + diabetes, does occasionally check CBG's at home     Physical Exam:  BP 124/88    Pulse 83    Temp (!) 97.5 F (36.4 C) (Skin)    Resp 20    Ht 5\' 2"  (1.575 m)    Wt 230 lb (104.3 kg)    LMP 09/01/2013    SpO2 96% Comment: RA   BMI 42.07 kg/m   General:  Morbidly obese AA female NAD   HEENT:  Unremarkable   Neck:   no JVD, no bruits, no adenopathy   Chest:   clear to auscultation, symmetrical breath sounds, no wheezes, no rhonchi   CV:   RRR, no  murmur   Abdomen:  soft, non-tender, no masses   Extremities:  warm, well-perfused, pulses not palpable, no LE edema  Rectal/GU  Deferred  Neuro:   Grossly non-focal and symmetrical throughout  Skin:   Clean and dry, no rashes, no breakdown   Diagnostic Tests:    ECHOCARDIOGRAM REPORT       Patient Name:   Michele Wood Date of Exam: 12/28/2018 Medical Rec #:  [id number]                  Height:       62.0 in Accession #:    [id number]                 Weight:       254.2 lb Date of Birth:  1961/03/11                  BSA:          2.12 m Patient Age:    15 years                   BP:           130/88 mmHg Patient Gender: F                          HR:           64 bpm. Exam Location:  [address]    Procedure: 2D Echo, 3D Echo, Cardiac Doppler, Color Doppler and Strain Analysis  Indications:    R07.9 Chest pain   History:        Patient has no prior history of Echocardiogram examinations.                 LBBB; Risk Factors: Hypertension, Diabetes, Dyslipidemia and                 Obese.   Sonographer:    Basilia Jumbo RDCS Referring Phys: [place]    1. The left ventricle has mildly reduced systolic function, with an ejection fraction of 45-50%. The cavity size was normal. Left ventricular diastolic Doppler parameters are consistent with impaired relaxation.  2. The mitral valve is grossly normal. There is mild mitral annular calcification present.  3.  The aortic valve is tricuspid Mild thickening of the aortic valve no stenosis of the aortic valve.  4. Septal dyssynergy with overall mildly reduced LV function; mild diastolic dysfunction.  FINDINGS  Left Ventricle: The left ventricle has mildly reduced systolic function, with an ejection fraction of 45-50%. The cavity size was normal. There is no increase in left ventricular wall thickness. Left ventricular diastolic Doppler parameters are  consistent with impaired relaxation. Right Ventricle: The right ventricle has normal systolic function. The cavity was normal. Left Atrium: left atrial size was normal in size Right  Atrium: right atrial size was normal in size. Right atrial pressure is estimated at 8 mmHg. Interatrial Septum: No atrial level shunt detected by color flow Doppler. Pericardium: There is no evidence of pericardial effusion. Mitral Valve: The mitral valve is grossly normal. There is mild mitral annular calcification present. Mitral valve regurgitation is trivial by color flow Doppler. Tricuspid Valve: The tricuspid valve is not well visualized. Tricuspid valve regurgitation is trivial by color flow Doppler. Aortic Valve: The aortic valve is tricuspid Mild thickening of the aortic valve. Aortic valve regurgitation was not visualized by color flow Doppler. There is no stenosis of the aortic valve. Pulmonic Valve: The pulmonic valve was grossly normal. Pulmonic valve regurgitation was not assessed by color flow Doppler. Venous: The inferior vena cava is normal in size with greater than 50% respiratory variability.   LEFT VENTRICLE PLAX 2D LVIDd:         4.70 cm  Diastology LVIDs:         4.00 cm  LV e' lateral:   7.76 cm/s LV PW:         1.00 cm  LV E/e' lateral: 9.8 LV IVS:        1.20 cm  LV e' medial:    5.91 cm/s LVOT diam:     2.00 cm  LV E/e' medial:  12.9 LV SV:         32 ml LV SV Index:   13.97    2D Longitudinal Strain LVOT Area:     3.14 cm 2D Strain GLS (A2C):    -19.3 %                         2D Strain GLS (A3C):   -13.2 %                         2D Strain GLS (A4C):   -11.5 %                         2D Strain GLS Avg:     -14.6 %                           3D Volume EF:                         3D EF:        49 %                         LV EDV:       155 ml                         LV ESV:       79 ml                         LV SV:        76 ml  RIGHT VENTRICLE RV S prime:     8.31 cm/s TAPSE (M-mode): 1.7 cm RVSP:           28.9 mmHg  LEFT ATRIUM             Index       RIGHT ATRIUM          Index LA  diam:        3.40 cm 1.61 cm/m  RA Pressure: 8 mmHg LA Vol (A2C):   29.2 ml 13.79 ml/m RA Area:     8.76 cm LA Vol (A4C):   20.5 ml 9.68 ml/m  RA Volume:   18.60 ml 8.79 ml/m LA Biplane Vol: 26.2 ml 12.38 ml/m  AORTIC VALVE LVOT Vmax:   99.20 cm/s LVOT Vmean:  76.800 cm/s LVOT VTI:    0.232 m   AORTA Ao Root diam: 3.30 cm Ao Asc diam:  3.40 cm  MITRAL VALVE              TRICUSPID VALVE                           TR Peak grad:   20.9 mmHg                           TR Vmax:        234.00 cm/s MV Decel Time: 232 msec   RVSP:           28.9 mmHg MV E velocity: 76.30 cm/s MV A velocity: 98.50 cm/s SHUNTS MV E/A ratio:  0.77       Systemic VTI:  0.23 m                           Systemic Diam: 2.00 cm    Kirk Ruths MD Electronically signed by Kirk Ruths MD Signature Date/Time: 12/28/2018/2:22:59 PM     Cardiac/Coronary  CT  TECHNIQUE: The patient was scanned on a [employer].  FINDINGS: A 120 kV prospective scan was triggered in the descending thoracic aorta at 111 HU's. Axial non-contrast 3 mm slices were carried out through the heart. The data set was analyzed on a dedicated work station and scored using the [place]. Gantry rotation speed was 250 msecs and collimation was .6 mm. No beta blockade and 0.8 mg of sl NTG was given. The 3D data set was reconstructed in 5% intervals of the 67-82 %  of the R-R cycle. Diastolic phases were analyzed on a dedicated work station using MPR, MIP and VRT modes. The patient received 80 cc of contrast.  Aorta:  Normal size.  No calcifications.  No dissection.  Aortic Valve:  Trileaflet.  No calcifications.  Coronary Arteries:  Normal coronary origin.  Right dominance.  RCA is a large dominant artery that gives rise to PDA and PLVB. There is minimal plaque.  Left main is a large artery that gives rise to LAD and LCX arteries. LM has no plaque.  LAD is a large vessel that has minimal plaque.  LCX is a non-dominant artery that gives rise to one large OM1 branch. There is minimal plaque.  Other findings:  Normal pulmonary vein drainage into the left atrium.  Normal let atrial appendage without a thrombus.  Normal size of the pulmonary artery.  IMPRESSION: 1. Coronary calcium score of 26. This was 82 percentile for age and sex matched control.  2. Normal coronary origin with right dominance.  3. Minimal non-obstructive CAD.  4. There is a large, homogenous, well circumcised mass measuring 23 x 19 x 18 mm present in the left atrium and attached with a wide base to the interatrial septum. Average HU 48, this most probably represents a myxoma.   Electronically Signed   By: Houston Siren  Nelson   On: 02/11/2019 13:43      TRANSESOPHOGEAL ECHO REPORT       Patient Name:   Michele Wood Date of Exam: 05/17/2019 Medical Rec #:  [id number]          Height:       62.0 in Accession #:    [id number]         Weight:       231.0 lb Date of Birth:  12/01/1960          BSA:          2.03 m Patient Age:    60 years           BP:           123/97 mmHg Patient Gender: F                  HR:           81 bpm. Exam Location:  Outpatient    Procedure: Transesophageal Echo  Indications:     Atrial myxoma   History:         Patient has prior history of Echocardiogram examinations, most                  recent  12/28/2018. LBBB Signs/Symptoms: Chest Pain Risk Factors:                  Hypertension, Dyslipidemia, Obesity and Diabetes.   Sonographer:     Talmage Coin Referring Phys:  [place] Diagnosing Phys: Mertie Moores MD     PROCEDURE: The transesophogeal probe was passed through the esophogus of the patient. The patient developed no complications during the procedure.  IMPRESSIONS    1. The left ventricle has normal systolic function, with an ejection fraction of 60-65%. The cavity size was normal.  2. There is a round mass in the left atrium . measuing 2.3 x 1.7 cm. It is largely immobile. There is a stalk that appears to connect to the atrial septum close to the fossa ovalis. Appears consistent with atrial myxoma.  FINDINGS  Left Ventricle: The left ventricle has normal systolic function, with an ejection fraction of 60-65%. The cavity size was normal. There is no increase in left ventricular wall thickness.  Left Atrium: There is a round mass in the left atrium . measuing 2.3 x 1.7 cm. It is largely immobile. There is a stalk that appears to connect to the atrial septum close to the fossa ovalis. Appears consistent with atrial myxoma.   Pericardium: There is no evidence of pericardial effusion.  Mitral Valve: The mitral valve is normal in structure. Mitral valve regurgitation is mild by color flow Doppler.  Tricuspid Valve: The tricuspid valve was normal in structure. Tricuspid valve regurgitation was not visualized by color flow Doppler.  Aortic Valve: The aortic valve is normal in structure. Aortic valve regurgitation was not visualized by color flow Doppler.    Mertie Moores MD Electronically signed by Mertie Moores MD Signature Date/Time: 05/17/2019/4:31:50 PM      Impression:  I have personally reviewed the patient's recent transthoracic and transesophageal echocardiograms and cardiac CT angiogram.  She has a moderate sized soft mass in the left  atrium adherent to the intra-atrial septum consistent with likely benign atrial myxoma.  She is essentially asymptomatic although she does describe symptoms of atypical chest pain and longstanding symptoms of mild exertional shortness of breath which may or may not be related.  She also reports occasional transient monocular blindness.  I recommend surgical resection for definitive diagnosis and to prevent future embolic events.  Risks associated with surgery will be slightly increased because of the patient's comorbid medical problems and limited mobility.  She may be an acceptable candidate for minimally invasive approach for surgery.    Plan:  I have personally reviewed images from the patient's recent transesophageal echocardiogram and described the nature and the pathophysiology of benign atrial myxoma to the patient in the office today.  Indications for surgical removal were discussed.  Risks of embolization or other complications related to continued observation without surgical intervention were discussed.  Alternative surgical approaches were discussed including a comparison between conventional median sternotomy and minithoracotomy approach were discussed.  Expectations for her postoperative convalescence have been discussed.  All the patient's questions have been addressed.  She is interested in proceeding with surgery but she would like some time to discuss matters over further with her husband and family.  We tentatively plan for surgery on June 08, 2019.  Patient will undergo CT angiography of the aorta and iliac vessels to evaluate the feasibility of peripheral cannulation for surgery.  We will confirm with Dr. Gwenlyn Wood whether or not he agrees that diagnostic cardiac catheterization does not need to be performed because of findings on the patient's recent gated CT angiogram of the heart.  The patient will return to our office for follow-up prior to surgery on June 07, 2019.   I spent in  excess of 90 minutes during the conduct of this office consultation and >50% of this time involved direct face-to-face encounter with the patient for counseling and/or coordination of their care.    Valentina Gu. Roxy Manns, MD 05/20/2019 2:22 PM

## 2019-05-20 NOTE — Patient Instructions (Signed)
Continue all previous medications without any changes at this time  

## 2019-05-21 ENCOUNTER — Encounter: Payer: Self-pay | Admitting: *Deleted

## 2019-05-21 ENCOUNTER — Other Ambulatory Visit: Payer: Self-pay | Admitting: Thoracic Surgery (Cardiothoracic Vascular Surgery)

## 2019-05-21 DIAGNOSIS — R0602 Shortness of breath: Secondary | ICD-10-CM

## 2019-05-21 DIAGNOSIS — D151 Benign neoplasm of heart: Secondary | ICD-10-CM

## 2019-05-21 NOTE — Progress Notes (Unsigned)
Ct aCt a

## 2019-05-24 ENCOUNTER — Encounter (HOSPITAL_COMMUNITY): Payer: Self-pay | Admitting: Psychiatry

## 2019-05-24 ENCOUNTER — Ambulatory Visit (INDEPENDENT_AMBULATORY_CARE_PROVIDER_SITE_OTHER): Payer: Medicare Other | Admitting: Psychiatry

## 2019-05-24 ENCOUNTER — Other Ambulatory Visit: Payer: Self-pay

## 2019-05-24 DIAGNOSIS — F331 Major depressive disorder, recurrent, moderate: Secondary | ICD-10-CM

## 2019-05-24 DIAGNOSIS — I209 Angina pectoris, unspecified: Secondary | ICD-10-CM | POA: Diagnosis not present

## 2019-05-24 DIAGNOSIS — F4310 Post-traumatic stress disorder, unspecified: Secondary | ICD-10-CM | POA: Diagnosis not present

## 2019-05-24 MED ORDER — LURASIDONE HCL 60 MG PO TABS: 60.0000 mg | ORAL_TABLET | Freq: Every day | ORAL | 2 refills | Status: DC

## 2019-05-24 MED ORDER — DULOXETINE HCL 60 MG PO CPEP: 60.0000 mg | ORAL_CAPSULE | Freq: Every day | ORAL | 2 refills | Status: DC

## 2019-05-24 MED ORDER — HYDROXYZINE PAMOATE 25 MG PO CAPS: 25.0000 mg | ORAL_CAPSULE | Freq: Every evening | ORAL | 2 refills | Status: DC | PRN
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
## 2019-05-31 ENCOUNTER — Other Ambulatory Visit: Payer: Medicare Other

## 2019-05-31 ENCOUNTER — Ambulatory Visit
Admission: RE | Admit: 2019-05-31 | Discharge: 2019-05-31 | Disposition: A | Discharge status: Home / Self Care | Payer: Medicare Other | Source: Ambulatory Visit | Attending: Thoracic Surgery (Cardiothoracic Vascular Surgery) | Admitting: Thoracic Surgery (Cardiothoracic Vascular Surgery)

## 2019-05-31 ENCOUNTER — Encounter: Payer: Self-pay | Admitting: Thoracic Surgery (Cardiothoracic Vascular Surgery)

## 2019-05-31 ENCOUNTER — Other Ambulatory Visit: Payer: Self-pay

## 2019-05-31 ENCOUNTER — Ambulatory Visit (INDEPENDENT_AMBULATORY_CARE_PROVIDER_SITE_OTHER): Payer: Medicaid Other | Admitting: Psychology

## 2019-05-31 DIAGNOSIS — R079 Chest pain, unspecified: Secondary | ICD-10-CM | POA: Diagnosis not present

## 2019-05-31 DIAGNOSIS — K862 Cyst of pancreas: Secondary | ICD-10-CM

## 2019-05-31 DIAGNOSIS — F3289 Other specified depressive episodes: Secondary | ICD-10-CM | POA: Diagnosis not present

## 2019-05-31 DIAGNOSIS — D151 Benign neoplasm of heart: Secondary | ICD-10-CM

## 2019-05-31 DIAGNOSIS — R0602 Shortness of breath: Secondary | ICD-10-CM

## 2019-05-31 DIAGNOSIS — K429 Umbilical hernia without obstruction or gangrene: Secondary | ICD-10-CM | POA: Diagnosis not present

## 2019-05-31 HISTORY — DX: Cyst of pancreas: K86.2

## 2019-05-31 MED ORDER — IOPAMIDOL (ISOVUE-370) INJECTION 76%
75.0000 mL | Freq: Once | INTRAVENOUS | Status: AC | PRN
  Administered 2019-05-31: 75 mL via INTRAVENOUS

## 2019-06-03 NOTE — Progress Notes (Signed)
[id number] [address], [place] [address] Phone: [phone] Fax: [phone]      Your procedure is scheduled on August 25  Report to [facility] Main Entrance "A" at 0530 A.M., and check in at the Admitting office.  Call this number if you have problems the morning of surgery:  [phone]  Call [phone] if you have any questions prior to your surgery date Monday-Friday 8am-4pm    Remember:  Do not eat or drink after midnight the night before your surgery    Take these medicines the morning of surgery with A SIP OF WATER  amLODipine (NORVASC) atorvastatin (LIPITOR)  DULoxetine (CYMBALTA methocarbamol (ROBAXIN)  montelukast (SINGULAIR) triamcinolone (NASACORT AQ)   7 days prior to surgery STOP taking any Aspirin (unless otherwise instructed by your surgeon), Aleve, Naproxen, Ibuprofen, Motrin, Advil, Goody's, BC's, all herbal medications, fish oil, and all vitamins.    The Morning of Surgery  Do not wear jewelry, make-up or nail polish.  Do not wear lotions, powders, or perfumes/colognes, or deodorant  Do not shave 48 hours prior to surgery.  Men may shave face and neck.  Do not bring valuables to the hospital.  [facility] is not responsible for any belongings or valuables.  If you are a smoker, DO NOT Smoke 24 hours prior to surgery IF you wear a CPAP at night please bring your mask, tubing, and machine the morning of surgery   Remember that you must have someone to transport you home after your surgery, and remain with you for 24 hours if you are discharged the same day.   Contacts, glasses, hearing aids, dentures or bridgework may not be worn into surgery.    Leave your suitcase in the car.  After surgery it may be brought to your room.  For patients admitted to the hospital, discharge time will be determined by your treatment team.  Patients discharged the  day of surgery will not be allowed to drive home.    Special instructions:   [facility]- Preparing For Surgery  Before surgery, you can play an important role. Because skin is not sterile, your skin needs to be as free of germs as possible. You can reduce the number of germs on your skin by washing with CHG (chlorahexidine gluconate) Soap before surgery.  CHG is an antiseptic cleaner which kills germs and bonds with the skin to continue killing germs even after washing.    Oral Hygiene is also important to reduce your risk of infection.  Remember - BRUSH YOUR TEETH THE MORNING OF SURGERY WITH YOUR REGULAR TOOTHPASTE  Please do not use if you have an allergy to CHG or antibacterial soaps. If your skin becomes reddened/irritated stop using the CHG.  Do not shave (including legs and underarms) for at least 48 hours prior to first CHG shower. It is OK to shave your face.  Please follow these instructions carefully.   1. Shower the NIGHT BEFORE SURGERY and the MORNING OF SURGERY with CHG Soap.   2. If you chose to wash your hair, wash your hair first as usual with your normal shampoo.  3. After you shampoo, rinse your hair and body thoroughly to remove the shampoo.  4. Use CHG as you would any other liquid soap. You can apply CHG directly to the skin and wash gently with a scrungie or a clean washcloth.   5. Apply the CHG Soap  to your body ONLY FROM THE NECK DOWN.  Do not use on open wounds or open sores. Avoid contact with your eyes, ears, mouth and genitals (private parts). Wash Face and genitals (private parts)  with your normal soap.   6. Wash thoroughly, paying special attention to the area where your surgery will be performed.  7. Thoroughly rinse your body with warm water from the neck down.  8. DO NOT shower/wash with your normal soap after using and rinsing off the CHG Soap.  9. Pat yourself dry with a CLEAN TOWEL.  10. Wear CLEAN PAJAMAS to bed the night before surgery, wear  comfortable clothes the morning of surgery  11. Place CLEAN SHEETS on your bed the night of your first shower and DO NOT SLEEP WITH PETS.    Day of Surgery:  Do not apply any deodorants/lotions. Please shower the morning of surgery with the CHG soap  Please wear clean clothes to the hospital/surgery center.   Remember to brush your teeth WITH YOUR REGULAR TOOTHPASTE.   Please read over the following fact sheets that you were given.

## 2019-06-04 ENCOUNTER — Encounter (HOSPITAL_COMMUNITY): Payer: Self-pay

## 2019-06-04 ENCOUNTER — Ambulatory Visit (HOSPITAL_COMMUNITY)
Admission: RE | Admit: 2019-06-04 | Discharge: 2019-06-04 | Disposition: A | Discharge status: Home / Self Care | Payer: Medicare Other | Source: Ambulatory Visit | Attending: Thoracic Surgery (Cardiothoracic Vascular Surgery) | Admitting: Thoracic Surgery (Cardiothoracic Vascular Surgery)

## 2019-06-04 ENCOUNTER — Other Ambulatory Visit: Payer: Self-pay

## 2019-06-04 ENCOUNTER — Other Ambulatory Visit (HOSPITAL_COMMUNITY)
Admission: RE | Admit: 2019-06-04 | Discharge: 2019-06-04 | Disposition: A | Discharge status: Home / Self Care | Payer: Medicare Other | Source: Ambulatory Visit

## 2019-06-04 ENCOUNTER — Encounter (HOSPITAL_COMMUNITY)
Admission: RE | Admit: 2019-06-04 | Discharge: 2019-06-04 | Disposition: A | Discharge status: Home / Self Care | Payer: Medicare Other | Source: Ambulatory Visit | Attending: Thoracic Surgery (Cardiothoracic Vascular Surgery) | Admitting: Thoracic Surgery (Cardiothoracic Vascular Surgery)

## 2019-06-04 DIAGNOSIS — Z7984 Long term (current) use of oral hypoglycemic drugs: Secondary | ICD-10-CM | POA: Diagnosis not present

## 2019-06-04 DIAGNOSIS — I10 Essential (primary) hypertension: Secondary | ICD-10-CM | POA: Insufficient documentation

## 2019-06-04 DIAGNOSIS — E669 Obesity, unspecified: Secondary | ICD-10-CM | POA: Diagnosis not present

## 2019-06-04 DIAGNOSIS — Z01818 Encounter for other preprocedural examination: Secondary | ICD-10-CM | POA: Diagnosis not present

## 2019-06-04 DIAGNOSIS — E119 Type 2 diabetes mellitus without complications: Secondary | ICD-10-CM | POA: Insufficient documentation

## 2019-06-04 DIAGNOSIS — Z7982 Long term (current) use of aspirin: Secondary | ICD-10-CM | POA: Diagnosis not present

## 2019-06-04 DIAGNOSIS — Z79899 Other long term (current) drug therapy: Secondary | ICD-10-CM | POA: Insufficient documentation

## 2019-06-04 DIAGNOSIS — D219 Benign neoplasm of connective and other soft tissue, unspecified: Secondary | ICD-10-CM

## 2019-06-04 DIAGNOSIS — G4730 Sleep apnea, unspecified: Secondary | ICD-10-CM | POA: Insufficient documentation

## 2019-06-04 DIAGNOSIS — Z20828 Contact with and (suspected) exposure to other viral communicable diseases: Secondary | ICD-10-CM | POA: Diagnosis not present

## 2019-06-04 LAB — URINALYSIS, ROUTINE W REFLEX MICROSCOPIC
Bacteria, UA: NONE SEEN
Bilirubin Urine: NEGATIVE
Glucose, UA: NEGATIVE mg/dL
Hgb urine dipstick: NEGATIVE
Ketones, ur: NEGATIVE mg/dL
Nitrite: NEGATIVE
Protein, ur: NEGATIVE mg/dL
Specific Gravity, Urine: 1.018 (ref 1.005–1.030)
pH: 7 (ref 5.0–8.0)

## 2019-06-04 LAB — TYPE AND SCREEN
ABO/RH(D): B NEG
Antibody Screen: NEGATIVE

## 2019-06-04 LAB — COMPREHENSIVE METABOLIC PANEL
ALT: 11 U/L (ref 0–44)
AST: 20 U/L (ref 15–41)
Albumin: 3.4 g/dL — ABNORMAL LOW (ref 3.5–5.0)
Alkaline Phosphatase: 98 U/L (ref 38–126)
Anion gap: 13 (ref 5–15)
BUN: 11 mg/dL (ref 6–20)
CO2: 19 mmol/L — ABNORMAL LOW (ref 22–32)
Calcium: 9 mg/dL (ref 8.9–10.3)
Chloride: 106 mmol/L (ref 98–111)
Creatinine, Ser: 0.83 mg/dL (ref 0.44–1.00)
GFR calc Af Amer: >60 mL/min (ref >60)
GFR calc non Af Amer: >60 mL/min (ref >60)
Glucose, Bld: 103 mg/dL — ABNORMAL HIGH (ref 70–99)
Potassium: 3.6 mmol/L (ref 3.5–5.1)
Sodium: 138 mmol/L (ref 135–145)
Total Bilirubin: 0.7 mg/dL (ref 0.3–1.2)
Total Protein: 7.5 g/dL (ref 6.5–8.1)

## 2019-06-04 LAB — SURGICAL PCR SCREEN
MRSA, PCR: NEGATIVE
Staphylococcus aureus: POSITIVE — AB

## 2019-06-04 LAB — PROTIME-INR
INR: 1 (ref 0.8–1.2)
Prothrombin Time: 13.3 seconds (ref 11.4–15.2)

## 2019-06-04 LAB — CBC
HCT: 38.7 % (ref 36.0–46.0)
Hemoglobin: 11.7 g/dL — ABNORMAL LOW (ref 12.0–15.0)
MCH: 24.7 pg — ABNORMAL LOW (ref 26.0–34.0)
MCHC: 30.2 g/dL (ref 30.0–36.0)
MCV: 81.8 fL (ref 80.0–100.0)
Platelets: 354 10*3/uL (ref 150–400)
RBC: 4.73 MIL/uL (ref 3.87–5.11)
RDW: 13.8 % (ref 11.5–15.5)
WBC: 8 10*3/uL (ref 4.0–10.5)
nRBC: 0 % (ref 0.0–0.2)

## 2019-06-04 LAB — SARS CORONAVIRUS 2 (TAT 6-24 HRS): SARS Coronavirus 2: NEGATIVE

## 2019-06-04 LAB — HEMOGLOBIN A1C
Hgb A1c MFr Bld: 5.8 % — ABNORMAL HIGH (ref 4.8–5.6)
Mean Plasma Glucose: 119.76 mg/dL

## 2019-06-04 LAB — APTT: aPTT: 25 seconds (ref 24–36)

## 2019-06-04 LAB — GLUCOSE, CAPILLARY: Glucose-Capillary: 87 mg/dL (ref 70–99)

## 2019-06-04 NOTE — Progress Notes (Signed)
Pre-CABG testing has been completed. Preliminary results can be found in CV Proc through chart review.   06/04/19 10:22 AM Michele Wood RVT

## 2019-06-04 NOTE — Progress Notes (Signed)
   06/04/19 1118  OBSTRUCTIVE SLEEP APNEA  Have you ever been diagnosed with sleep apnea through a sleep study? Yes  If yes, do you have and use a CPAP or BPAP machine every night? 0  Do you snore loudly (loud enough to be heard through closed doors)?  1  Do you often feel tired, fatigued, or sleepy during the daytime (such as falling asleep during driving or talking to someone)? 0  Has anyone observed you stop breathing during your sleep? 1  Do you have, or are you being treated for high blood pressure? 1  BMI more than 35 kg/m2? 1  Age > 50 (1-yes) 1  Neck circumference greater than:Female 16 inches or larger, Female 17inches or larger? 0  Female Gender (Yes=1) 0  Obstructive Sleep Apnea Score 5  Score 5 or greater  Results sent to PCP

## 2019-06-07 ENCOUNTER — Other Ambulatory Visit: Payer: Self-pay

## 2019-06-07 ENCOUNTER — Ambulatory Visit (INDEPENDENT_AMBULATORY_CARE_PROVIDER_SITE_OTHER): Payer: Medicare Other | Admitting: Thoracic Surgery (Cardiothoracic Vascular Surgery)

## 2019-06-07 ENCOUNTER — Encounter: Payer: Self-pay | Admitting: Thoracic Surgery (Cardiothoracic Vascular Surgery)

## 2019-06-07 VITALS — BP 135/86 | HR 80 | Temp 96.3°F | Resp 16 | Ht 62.0 in | Wt 235.0 lb

## 2019-06-07 DIAGNOSIS — D151 Benign neoplasm of heart: Secondary | ICD-10-CM | POA: Diagnosis not present

## 2019-06-07 DIAGNOSIS — K862 Cyst of pancreas: Secondary | ICD-10-CM

## 2019-06-07 DIAGNOSIS — I209 Angina pectoris, unspecified: Secondary | ICD-10-CM

## 2019-06-07 MED ORDER — TRANEXAMIC ACID 1000 MG/10ML IV SOLN
1.5000 mg/kg/h | INTRAVENOUS | Status: DC
  Filled 2019-06-07 (×2): qty 25

## 2019-06-07 MED ORDER — TRANEXAMIC ACID (OHS) BOLUS VIA INFUSION
15.0000 mg/kg | INTRAVENOUS | Status: AC
  Administered 2019-06-08: 09:00:00 1600.5 mg via INTRAVENOUS
  Filled 2019-06-07: qty 1601

## 2019-06-07 MED ORDER — PLASMA-LYTE 148 IV SOLN
INTRAVENOUS | Status: AC
  Administered 2019-06-08: 08:00:00 500 mL
  Filled 2019-06-07: qty 2.5

## 2019-06-07 MED ORDER — POTASSIUM CHLORIDE 2 MEQ/ML IV SOLN
80.0000 meq | INTRAVENOUS | Status: DC
  Filled 2019-06-07: qty 40

## 2019-06-07 MED ORDER — TRANEXAMIC ACID (OHS) PUMP PRIME SOLUTION
2.0000 mg/kg | INTRAVENOUS | Status: DC
  Filled 2019-06-07: qty 2.13

## 2019-06-07 MED ORDER — EPINEPHRINE HCL 5 MG/250ML IV SOLN IN NS
0.0000 ug/min | INTRAVENOUS | Status: DC
  Filled 2019-06-07: qty 250

## 2019-06-07 MED ORDER — PHENYLEPHRINE HCL-NACL 20-0.9 MG/250ML-% IV SOLN
30.0000 ug/min | INTRAVENOUS | Status: DC
  Filled 2019-06-07: qty 250

## 2019-06-07 MED ORDER — MILRINONE LACTATE IN DEXTROSE 20-5 MG/100ML-% IV SOLN
0.3000 ug/kg/min | INTRAVENOUS | Status: DC
  Filled 2019-06-07: qty 100

## 2019-06-07 MED ORDER — DOPAMINE-DEXTROSE 3.2-5 MG/ML-% IV SOLN
0.0000 ug/kg/min | INTRAVENOUS | Status: DC
  Filled 2019-06-07: qty 250

## 2019-06-07 MED ORDER — MAGNESIUM SULFATE 50 % IJ SOLN
40.0000 meq | INTRAMUSCULAR | Status: DC
  Filled 2019-06-07: qty 9.85

## 2019-06-07 MED ORDER — NITROGLYCERIN IN D5W 200-5 MCG/ML-% IV SOLN
2.0000 ug/min | INTRAVENOUS | Status: DC
  Filled 2019-06-07: qty 250

## 2019-06-07 MED ORDER — SODIUM CHLORIDE 0.9 % IV SOLN
INTRAVENOUS | Status: DC
  Filled 2019-06-07: qty 30

## 2019-06-07 MED ORDER — SODIUM CHLORIDE 0.9 % IV SOLN
750.0000 mg | INTRAVENOUS | Status: DC
  Filled 2019-06-07: qty 750

## 2019-06-07 MED ORDER — VANCOMYCIN HCL 10 G IV SOLR
1500.0000 mg | INTRAVENOUS | Status: AC
  Administered 2019-06-08: 09:00:00 1500 mg via INTRAVENOUS
  Filled 2019-06-07: qty 1500

## 2019-06-07 MED ORDER — GLUTARALDEHYDE 0.625% SOAKING SOLUTION
TOPICAL | Status: AC
  Administered 2019-06-08: 1 via TOPICAL
  Filled 2019-06-07: qty 50

## 2019-06-07 MED ORDER — MANNITOL 20 % IV SOLN
Freq: Once | INTRAVENOUS | Status: DC
  Filled 2019-06-07: qty 13

## 2019-06-07 MED ORDER — NOREPINEPHRINE 4 MG/250ML-% IV SOLN
0.0000 ug/min | INTRAVENOUS | Status: DC
  Filled 2019-06-07: qty 250

## 2019-06-07 MED ORDER — SODIUM CHLORIDE 0.9 % IV SOLN
1.5000 g | INTRAVENOUS | Status: AC
  Administered 2019-06-08: 08:00:00 1.5 g via INTRAVENOUS
  Administered 2019-06-08: 12:00:00 .75 g via INTRAVENOUS
  Filled 2019-06-07: qty 1.5

## 2019-06-07 MED ORDER — DEXMEDETOMIDINE HCL IN NACL 400 MCG/100ML IV SOLN
0.1000 ug/kg/h | INTRAVENOUS | Status: DC
  Filled 2019-06-07: qty 100

## 2019-06-07 MED ORDER — INSULIN REGULAR(HUMAN) IN NACL 100-0.9 UT/100ML-% IV SOLN
INTRAVENOUS | Status: DC
  Filled 2019-06-07: qty 100

## 2019-06-07 MED ORDER — VANCOMYCIN HCL 1000 MG IV SOLR
INTRAVENOUS | Status: AC
  Administered 2019-06-08: 11:00:00 1000 mL
  Filled 2019-06-07: qty 1000

## 2019-06-07 NOTE — Patient Instructions (Signed)
Stop taking metformin and aspirin   Continue taking all other medications without change through the day before surgery.  Have nothing to eat or drink after midnight the night before surgery.  On the morning of surgery do not take any medications

## 2019-06-07 NOTE — Anesthesia Preprocedure Evaluation (Addendum)
Anesthesia Evaluation  Patient identified by MRN, date of birth, ID band Patient awake    Reviewed: Allergy & Precautions, NPO status , Patient's Chart, lab work & pertinent test results  History of Anesthesia Complications Negative for: history of anesthetic complications  Airway Mallampati: II  TM Distance: >3 FB Neck ROM: Full    Dental  (+) Dental Advisory Given, Missing   Pulmonary sleep apnea and Continuous Positive Airway Pressure Ventilation , COPD,  COPD inhaler,  06/04/2019 SARS coronavirus NEG   breath sounds clear to auscultation       Cardiovascular hypertension, Pt. 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 ECHO: EF 60-65%, 2.3 x1.7cm mass in L atrium, connected by stalk near foss ovalis, valves OK   Neuro/Psych Anxiety Depression Chronic back pain    GI/Hepatic Neg liver ROS, GERD  ,  Endo/Other  diabetes, Oral Hypoglycemic AgentsMorbid obesity  Renal/GU negative Renal ROS     Musculoskeletal   Abdominal (+) + obese,   Peds  Hematology negative hematology ROS (+)   Anesthesia Other Findings   Reproductive/Obstetrics                           Anesthesia Physical Anesthesia Plan  ASA: III  Anesthesia Plan: General   Post-op Pain Management:    Induction: Intravenous  PONV Risk Score and Plan: 3 and Ondansetron and Dexamethasone  Airway Management Planned: Oral ETT and Double Lumen EBT  Additional Equipment: Arterial line, PA Cath, 3D TEE and Ultrasound Guidance Line Placement  Intra-op Plan:   Post-operative Plan: Possible Post-op intubation/ventilation  Informed Consent:   Plan Discussed with:   Anesthesia Plan Comments:         Anesthesia Quick Evaluation

## 2019-06-07 NOTE — H&P (Signed)
[address]       [place] [address]             [phone]          CARDIOTHORACIC SURGERY HISTORY AND PHYSICAL EXAM  Referring Provider is Nahser, Wonda Cheng, MD Primary Cardiologist is Quay Burow, MD PCP is Ladell Pier, MD  Chief Complaint  Patient presents with   Consult    Surgical eval for LA Myxoma, Cardiac CT 02/11/19, TEE 05/17/19    HPI:  Patient is a 58 year old morbidly obese female with history of hypertension, obstructive sleep apnea, degenerative disc disease of the cervical and lumbar spine status post recent spine surgery, and type 2 diabetes mellitus without complications who has been referred for surgical consultation to discuss treatment options for management of recently discovered left atrial mass 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  Patient has long history of stable symptoms of exertional shortness of breath but more recently has experienced symptoms of exertional chest tightness.  She also has had problems with chronic back pain and weakness in her legs for which she was evaluated by Dr. Arnoldo Morale from neurosurgery.  She was referred for preoperative cardiac clearance and evaluated by Dr. Gwenlyn Found in March of this year.  Prior to that she had been seen in the emergency department in early February for accelerated symptoms of chest pain.  She ruled out for myocardial infarction at that time.  She was noted to have left bundle branch block on baseline EKG.  She underwent transthoracic echocardiogram December 28, 2018 which revealed mildly reduced left ventricular systolic function with ejection fraction estimated 45 to 50%.  Left ventricular chamber size was notably small.  There were signs suggestive of significant diastolic dysfunction.  No valvular abnormalities were noted.  Cardiac gated CT angiogram of the heart was performed February 11, 2019.  This revealed coronary calcium score of 26 with minimal nonobstructive coronary artery  disease.  The patient was also noted to have a relatively large, homogeneous, well-circumscribed mass in the left adherent to the fossa ovalis adherent to the intra-atrial septum with findings consistent with likely left atrial myxoma.  The patient was cleared for surgery and subsequently underwent spine surgery by Dr. Arnoldo Morale including bilateral L3-4 and L4-5 nerve root decompression and fusion.  She has recovered from this procedure without complication and subsequently underwent transesophageal echocardiogram 05/17/2019 confirming the presence of a 2.3 x 1 7 cm mass in the left atrium adherent to the fossa ovalis, consistent with atrial myxoma.  Cardiothoracic surgical consultation was requested.  Patient is married and lives with her husband in [place].  She has been disabled for the past 2 years because of problems with arthritis and degenerative disc disease in her neck and spine.  She previously worked in [employer].  She has 2 adult children and 3 grandchildren.  She lives a sedentary lifestyle.  Her mobility is limited by chronic pain, weakness, and some instability of gait.  She ambulates using a cane.  She has been obese all of her adult life.  She describes stable symptoms of exertional shortness of breath that have not changed for the past several years.  She has had some intermittent episodes of tightness across her chest that seem to be brought on with exertion and relieved by rest.  Symptoms of chest tightness are not necessarily associated with exertion.  She reports occasional slight dizzy spells and occasional palpitations.  She denies any PND, orthopnea, or  lower extremity edema.  She denies any fevers, chills, or weight loss.  She states that she feels as though she has recovered fairly well from her recent spine surgery.  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  She was originally seen in consultation on May 20, 2019.  She reports no new problems or complaints since that time and she specifically denies any symptoms of fever, cough, loss of sense of smell or taste, or exposure to any persons with known or suspected COVID-19 infection.   Past Medical History:  Diagnosis Date   Anginal pain (HCC)    Chronic back pain    DDD (degenerative disc disease), cervical    Depression    Diabetes mellitus without complication ([place])    Dyspnea    Hypertension    at age 19   Obesity    Pancreatic cyst 05/31/2019   Incidental - discovered on CT scan   Sinusitis    Sleep apnea    at age 58    Past Surgical History:  Procedure Laterality Date   ABDOMINAL HYSTERECTOMY     BACK SURGERY     BREAST BIOPSY Right 07/02/2006   [place] Core benign   NASAL SINUS SURGERY     TEE WITHOUT CARDIOVERSION N/A 05/17/2019   Procedure: TRANSESOPHAGEAL ECHOCARDIOGRAM (TEE);  Surgeon: Acie Fredrickson Wonda Cheng, MD;  Location: [facility] ENDOSCOPY;  Service: Cardiovascular;  Laterality: N/A;    Family History  Problem Relation Age of Onset   Hypertension Mother    Hypertension Other    Schizophrenia Brother     Social History Social History   Tobacco Use   Smoking status: Never Smoker   Smokeless tobacco: Never Used  Substance Use Topics   Alcohol use: Yes    Alcohol/week: 0.0 standard drinks    Comment: rare   Drug use: No    Prior to Admission medications   Medication Sig Start Date End Date Taking? Authorizing Provider  amLODipine (NORVASC) 10 MG tablet Take 1 tablet (10 mg total) by mouth daily. 01/18/19  Yes Ladell Pier, MD  aspirin 81 MG EC tablet TAKE 1 TABLET BY MOUTH EVERY DAY Patient  taking differently: Take 81 mg by mouth daily.  05/10/19  Yes Ladell Pier, MD  atorvastatin (LIPITOR) 40 MG tablet Take 1 tablet (40 mg total) by mouth daily. 01/08/19  Yes Lorretta Harp, MD  Cholecalciferol (VITAMIN D3) 2000 UNITS TABS Take 2,000 Units by mouth daily. Patient taking differently: Take 2,000 Units by mouth 2 (two) times a week.  09/19/15  Yes Funches, Josalyn, MD  DULoxetine (CYMBALTA) 60 MG capsule Take 1 capsule (60 mg total) by mouth daily. 05/24/19  Yes Arfeen, Arlyce Harman, MD  hydrochlorothiazide (HYDRODIURIL) 25 MG tablet Take 1 tablet (25 mg total) by mouth daily. 01/18/19  Yes Ladell Pier, MD  hydrOXYzine (VISTARIL) 25 MG capsule Take 1 capsule (25 mg total) by mouth at bedtime as needed for anxiety. 05/24/19  Yes Arfeen, Arlyce Harman, MD  lisinopril (PRINIVIL,ZESTRIL) 40 MG tablet Take 1 tablet (40 mg total) by mouth daily. 10/19/18  Yes Ladell Pier, MD  Lurasidone HCl 60 MG TABS Take 1 tablet (60  mg total) by mouth daily with breakfast. 05/24/19  Yes Arfeen, Arlyce Harman, MD  metFORMIN (GLUCOPHAGE) 500 MG tablet Take 1 tablet (500 mg total) by mouth daily with breakfast. 10/19/18  Yes Ladell Pier, MD  methocarbamol (ROBAXIN) 750 MG tablet Take 1 tablet (750 mg total) by mouth every 6 (six) hours as needed for muscle spasms. 04/02/19  Yes Newman Pies, MD  montelukast (SINGULAIR) 10 MG tablet Take 1 tablet (10 mg total) by mouth at bedtime. Patient taking differently: Take 10 mg by mouth daily.  08/22/16  Yes Funches, Josalyn, MD  triamcinolone (NASACORT AQ) 55 MCG/ACT AERO nasal inhaler Place 2 sprays into the nose daily. 08/22/16  Yes Funches, Josalyn, MD  oxyCODONE 10 MG TABS Take 1 tablet (10 mg total) by mouth every 4 (four) hours as needed for severe pain ((score 7 to 10)). Patient not taking: Reported on 05/31/2019 04/02/19   Newman Pies, MD    Allergies  Allergen Reactions   Codeine Sulfate Rash   Milk-Related Compounds Diarrhea and Other (See Comments)      Diarrhea and stomach upset     Review of Systems:              General:                      decreased appetite, no energy, no weight gain, no weight loss, no fever             Cardiac:                       + chest pain with exertion, no chest pain at rest, +SOB with exertion, no resting SOB, no PND, no orthopnea, + palpitations, no arrhythmia, no atrial fibrillation, no LE edema, + dizzy spells, no syncope             Respiratory:                 no shortness of breath, no home oxygen, no productive cough, no dry cough, no bronchitis, no wheezing, no hemoptysis, no asthma, no pain with inspiration or cough, no sleep apnea, no CPAP at night             GI:                               some difficulty swallowing which she attributes to pain in her neck, no reflux, no frequent heartburn, no hiatal hernia, no abdominal pain, no constipation, no diarrhea, no hematochezia, no hematemesis, no melena             GU:                              no dysuria,  no frequency, no urinary tract infection, no hematuria, no kidney stones, no kidney disease             Vascular:                     no pain suggestive of claudication, no pain in feet, no leg cramps, no varicose veins, no DVT, no non-healing foot ulcer             Neuro:  no stroke, no TIA's, no seizures, no headaches, + occasional temporary blindness one eye,  no slurred speech, + peripheral neuropathy, + chronic pain, + instability of gait, no memory/cognitive dysfunction             Musculoskeletal:         + arthritis, no joint swelling, no myalgias, + difficulty walking, limited mobility              Skin:                            no rash, no itching, no skin infections, no pressure sores or ulcerations             Psych:                         + anxiety, + depression, no nervousness, no unusual recent stress             Eyes:                           + blurry vision, no floaters, no recent vision changes, +  wears glasses or contacts             ENT:                            no hearing loss, no loose or painful teeth, no dentures, last saw dentist within the past year             Hematologic:               no easy bruising, no abnormal bleeding, no clotting disorder, no frequent epistaxis             Endocrine:                   + diabetes, does occasionally check CBG's at home                           Physical Exam:              BP 124/88    Pulse 83    Temp (!) 97.5 F (36.4 C) (Skin)    Resp 20    Ht 5\' 2"  (1.575 m)    Wt 230 lb (104.3 kg)    LMP 09/01/2013    SpO2 96% Comment: RA   BMI 42.07 kg/m              General:                      Morbidly obese AA female NAD              HEENT:                       Unremarkable              Neck:                           no JVD, no bruits, no adenopathy              Chest:  clear to auscultation, symmetrical breath sounds, no wheezes, no rhonchi              CV:                              RRR, no  murmur              Abdomen:                    soft, non-tender, no masses              Extremities:                 warm, well-perfused, pulses not palpable, no LE edema             Rectal/GU                   Deferred             Neuro:                         Grossly non-focal and symmetrical throughout             Skin:                            Clean and dry, no rashes, no breakdown   Diagnostic Tests:  ECHOCARDIOGRAM REPORT     Patient Name: SENG FOPPE Date of Exam: 12/28/2018 Medical Rec #: [id number] Height: 62.0 in Accession #: [id number] Weight: 254.2 lb Date of Birth: Apr 29, 1961 BSA: 2.12 m Patient Age: 9 years BP: 130/88 mmHg Patient Gender: F HR: 64 bpm. Exam Location: [address]   Procedure: 2D Echo, 3D Echo, Cardiac  Doppler, Color Doppler and Strain Analysis  Indications: R07.9 Chest pain  History: Patient has no prior history of Echocardiogram examinations. LBBB; Risk Factors: Hypertension, Diabetes, Dyslipidemia and Obese.  Sonographer: Basilia Jumbo RDCS Referring Phys: [place]   1. The left ventricle has mildly reduced systolic function, with an ejection fraction of 45-50%. The cavity size was normal. Left ventricular diastolic Doppler parameters are consistent with impaired relaxation. 2. The mitral valve is grossly normal. There is mild mitral annular calcification present. 3. The aortic valve is tricuspid Mild thickening of the aortic valve no stenosis of the aortic valve. 4. Septal dyssynergy with overall mildly reduced LV function; mild diastolic dysfunction.  FINDINGS Left Ventricle: The left ventricle has mildly reduced systolic function, with an ejection fraction of 45-50%. The cavity size was normal. There is no increase in left ventricular wall thickness. Left ventricular diastolic Doppler parameters are  consistent with impaired relaxation. Right Ventricle: The right ventricle has normal systolic function. The cavity was normal. Left Atrium: left atrial size was normal in size Right Atrium: right atrial size was normal in size. Right atrial pressure is estimated at 8 mmHg. Interatrial Septum: No atrial level shunt detected by color flow Doppler. Pericardium: There is no evidence of pericardial effusion. Mitral Valve: The mitral valve is grossly normal. There is mild mitral annular calcification present. Mitral valve regurgitation is trivial by color flow Doppler. Tricuspid Valve: The tricuspid valve is not well visualized. Tricuspid valve regurgitation is trivial by color flow Doppler. Aortic Valve: The aortic valve is tricuspid Mild thickening of the aortic valve. Aortic valve regurgitation was not  visualized  by color flow Doppler. There is no stenosis of the aortic valve. Pulmonic Valve: The pulmonic valve was grossly normal. Pulmonic valve regurgitation was not assessed by color flow Doppler. Venous: The inferior vena cava is normal in size with greater than 50% respiratory variability.  LEFT VENTRICLE PLAX 2D LVIDd: 4.70 cm Diastology LVIDs: 4.00 cm LV e' lateral: 7.76 cm/s LV PW: 1.00 cm LV E/e' lateral: 9.8 LV IVS: 1.20 cm LV e' medial: 5.91 cm/s LVOT diam: 2.00 cm LV E/e' medial: 12.9 LV SV: 32 ml LV SV Index: 13.97 2D Longitudinal Strain LVOT Area: 3.14 cm 2D Strain GLS (A2C): -19.3 % 2D Strain GLS (A3C): -13.2 % 2D Strain GLS (A4C): -11.5 % 2D Strain GLS Avg: -14.6 %  3D Volume EF: 3D EF: 49 % LV EDV: 155 ml LV ESV: 79 ml LV SV: 76 ml  RIGHT VENTRICLE RV S prime: 8.31 cm/s TAPSE (M-mode): 1.7 cm RVSP: 28.9 mmHg  LEFT ATRIUM Index RIGHT ATRIUM Index LA diam: 3.40 cm 1.61 cm/m RA Pressure: 8 mmHg LA Vol (A2C): 29.2 ml 13.79 ml/m RA Area: 8.76 cm LA Vol (A4C): 20.5 ml 9.68 ml/m RA Volume: 18.60 ml 8.79 ml/m LA Biplane Vol: 26.2 ml 12.38 ml/m AORTIC VALVE LVOT Vmax: 99.20 cm/s LVOT Vmean: 76.800 cm/s LVOT VTI: 0.232 m  AORTA Ao Root diam: 3.30 cm Ao Asc diam: 3.40 cm  MITRAL VALVE TRICUSPID VALVE TR Peak grad: 20.9 mmHg TR Vmax: 234.00 cm/s MV Decel Time: 232 msec RVSP: 28.9 mmHg MV E velocity: 76.30 cm/s MV A velocity: 98.50 cm/s SHUNTS MV E/A ratio: 0.77 Systemic VTI: 0.23  m Systemic Diam: 2.00 cm   Kirk Ruths MD Electronically signed by Kirk Ruths MD Signature Date/Time: 12/28/2018/2:22:59 PM    Cardiac/Coronary CT  TECHNIQUE: The patient was scanned on a [employer].  FINDINGS: A 120 kV prospective scan was triggered in the descending thoracic aorta at 111 HU's. Axial non-contrast 3 mm slices were carried out through the heart. The data set was analyzed on a dedicated work station and scored using the [place]. Gantry rotation speed was 250 msecs and collimation was .6 mm. No beta blockade and 0.8 mg of sl NTG was given. The 3D data set was reconstructed in 5% intervals of the 67-82 % of the R-R cycle. Diastolic phases were analyzed on a dedicated work station using MPR, MIP and VRT modes. The patient received 80 cc of contrast.  Aorta: Normal size. No calcifications. No dissection.  Aortic Valve: Trileaflet. No calcifications.  Coronary Arteries: Normal coronary origin. Right dominance.  RCA is a large dominant artery that gives rise to PDA and PLVB. There is minimal plaque.  Left main is a large artery that gives rise to LAD and LCX arteries. LM has no plaque.  LAD is a large vessel that has minimal plaque.  LCX is a non-dominant artery that gives rise to one large OM1 branch. There is minimal plaque.  Other findings:  Normal pulmonary vein drainage into the left atrium.  Normal let atrial appendage without a thrombus.  Normal size of the pulmonary artery.  IMPRESSION: 1. Coronary calcium score of 26. This was 29 percentile for age and sex matched control.  2. Normal coronary origin with right dominance.  3. Minimal non-obstructive CAD.  4. There is a large, homogenous, well circumcised mass measuring 23 x 19 x 18 mm present in the left atrium and attached with a wide base to the interatrial septum. Average HU 48, this most  probably represents a myxoma.   Electronically Signed By: Ena Dawley On: 02/11/2019 13:43    TRANSESOPHOGEAL ECHO REPORT     Patient Name: LUCILLA MERITHEW Date of Exam: 05/17/2019 Medical Rec #: [id number] Height: 62.0 in Accession #: [id number] Weight: 231.0 lb Date of Birth: 16-May-1961 BSA: 2.03 m Patient Age: 66 years BP: 123/97 mmHg Patient Gender: F HR: 81 bpm. Exam Location: Outpatient   Procedure: Transesophageal Echo  Indications: Atrial myxoma  History: Patient has prior history of Echocardiogram examinations, most recent 12/28/2018. LBBB Signs/Symptoms: Chest Pain Risk Factors: Hypertension, Dyslipidemia, Obesity and Diabetes.  Sonographer: Talmage Coin Referring Phys: [place] Diagnosing Phys: Mertie Moores MD    PROCEDURE: The transesophogeal probe was passed through the esophogus of the patient. The patient developed no complications during the procedure.  IMPRESSIONS   1. The left ventricle has normal systolic function, with an ejection fraction of 60-65%. The cavity size was normal. 2. There is a round mass in the left atrium . measuing 2.3 x 1.7 cm. It is largely immobile. There is a stalk that appears to connect to the atrial septum close to the fossa ovalis. Appears consistent with atrial myxoma.  FINDINGS Left Ventricle: The left ventricle has normal systolic function, with an ejection fraction of 60-65%. The cavity size was normal. There is no increase in left ventricular wall thickness.  Left Atrium: There is a round mass in the left atrium . measuing 2.3 x 1.7 cm. It is largely immobile. There is a stalk that appears to connect to the atrial septum close to the fossa ovalis. Appears consistent with atrial myxoma.   Pericardium: There is no  evidence of pericardial effusion.  Mitral Valve: The mitral valve is normal in structure. Mitral valve regurgitation is mild by color flow Doppler.  Tricuspid Valve: The tricuspid valve was normal in structure. Tricuspid valve regurgitation was not visualized by color flow Doppler.  Aortic Valve: The aortic valve is normal in structure. Aortic valve regurgitation was not visualized by color flow Doppler.   Mertie Moores MD Electronically signed by Mertie Moores MD Signature Date/Time: 05/17/2019/4:31:50 PM  CT ANGIOGRAPHY CHEST, ABDOMEN AND PELVIS  TECHNIQUE: Multidetector CT imaging through the chest, abdomen and pelvis was performed using the standard protocol during bolus administration of intravenous contrast. Multiplanar reconstructed images and MIPs were obtained and reviewed to evaluate the vascular anatomy.  CONTRAST: 36mL ISOVUE-370 IOPAMIDOL (ISOVUE-370) INJECTION 76%  COMPARISON: CT scan of February 11, 2019.  FINDINGS: CTA CHEST FINDINGS  Cardiovascular: There is no evidence of thoracic aortic dissection or aneurysm. Great vessels are widely patent without significant stenosis. 2.5 cm left atrial mass is noted most consistent with myxoma. Normal cardiac size. No pericardial effusion is noted.  Mediastinum/Nodes: No enlarged mediastinal, hilar, or axillary lymph nodes. Thyroid gland, trachea, and esophagus demonstrate no significant findings.  Lungs/Pleura: Lungs are clear. No pleural effusion or pneumothorax.  Musculoskeletal: No chest wall abnormality. No acute or significant osseous findings.  Review of the MIP images confirms the above findings.  CTA ABDOMEN AND PELVIS FINDINGS  VASCULAR  Aorta: Normal caliber aorta without aneurysm, dissection, vasculitis or significant stenosis.  Celiac: Patent without evidence of aneurysm, dissection, vasculitis or significant stenosis.  SMA: Patent without evidence of aneurysm, dissection,  vasculitis or significant stenosis.  Renals: Both renal arteries are patent without evidence of aneurysm, dissection, vasculitis, fibromuscular dysplasia or significant stenosis.  IMA: Patent without evidence of aneurysm, dissection, vasculitis or significant stenosis.  Inflow: Patent without evidence of  aneurysm, dissection, vasculitis or significant stenosis.  Veins: No obvious venous abnormality within the limitations of this arterial phase study.  Review of the MIP images confirms the above findings.  NON-VASCULAR  Hepatobiliary: No focal liver abnormality is seen. No gallstones, gallbladder wall thickening, or biliary dilatation.  Pancreas: Possible 1.4 cm cyst seen in pancreatic body.  Spleen: Normal in size without focal abnormality.  Adrenals/Urinary Tract: Adrenal glands are unremarkable. Kidneys are normal, without renal calculi, focal lesion, or hydronephrosis. Bladder is unremarkable.  Stomach/Bowel: Stomach is within normal limits. Appendix appears normal. No evidence of bowel wall thickening, distention, or inflammatory changes.  Lymphatic: No significant vascular findings are present. No enlarged abdominal or pelvic lymph nodes.  Reproductive: Status post hysterectomy. No adnexal masses.  Other: Small fat containing periumbilical hernia is noted. No ascites is noted.  Musculoskeletal: No acute or significant osseous findings.  Review of the MIP images confirms the above findings.  IMPRESSION: No evidence of thoracic or abdominal aortic dissection or aneurysm.  Great vessels and mesenteric and renal arteries are widely patent without significant stenosis.  2.5 cm left atrial pedunculated mass is noted most consistent with myxoma.  Possible 1.4 cm cyst seen in pancreatic body. Recommend follow up pre and post contrast MRI/MRCP or pancreatic protocol CT in 1 year. This recommendation follows ACR consensus guidelines: Management  of Incidental Pancreatic Cysts: A White Paper of the ACR Incidental Findings Committee. [place] [id number].   Electronically Signed By: Marijo Conception M.D. On: 05/31/2019 14:35    CHEST - 2 VIEW  COMPARISON: November 21, 2018  FINDINGS: Lungs are clear. Heart is upper normal in size with pulmonary vascularity normal. No adenopathy. No bone lesions.  IMPRESSION: No edema or consolidation. Heart upper normal in size with normal pulmonary vascularity.   Electronically Signed By: Lowella Grip III M.D. On: 06/04/2019 16:30     Impression:  Patient has a moderate sized mass in the left atrium adherent to the intra-atrial septum consistent with likely benign atrial myxoma.  She remains essentially asymptomatic although she does report occasional symptoms of atypical chest pain and longstanding symptoms of mild exertional shortness of breath.  I have personally reviewed the patient's recent echocardiograms which reveal findings consistent with benign atrial myxoma.  CT angiography of the chest, abdomen, and pelvis revealed no contraindications to peripheral cannulation for surgery.  The patient does have a small cystic lesion in the pancreas which likely represents a benign cyst but may require follow-up in the distant future.    Plan:  I have again described the nature and the pathophysiology of benign atrial myxoma with the patient and her husband in the office today. Indications for surgical removal were discussed. Risks of embolization or other complications related to continued observation without surgical intervention were discussed. Alternative surgical approaches were discussed including a comparison between conventional median sternotomy and minithoracotomy approach were discussed. Expectations for her postoperative convalescence have been discussed. They understand and accept all potential risks of surgery including but not  limited to risk of death, stroke or other neurologic complication, myocardial infarction, congestive heart failure, respiratory failure, renal failure, bleeding requiring transfusion and/or reexploration, arrhythmia, infection or other wound complications, pneumonia, pleural and/or pericardial effusion, pulmonary embolus, aortic dissection or other major vascular complication, or delayed complications related to valve repair or replacement including but not limited to structural valve deterioration and failure, thrombosis, embolization, endocarditis, or paravalvular leak.  Specific risks potentially related to the minimally-invasive approach were discussed at length,  including but not limited to risk of conversion to full or partial sternotomy, aortic dissection or other major vascular complication, unilateral acute lung injury or pulmonary edema, phrenic nerve dysfunction or paralysis, rib fracture, chronic pain, lung hernia, or lymphocele.  We also discussed the finding of the benign appearing cystic mass in the pancreas on recent CT imaging and the need for follow-up in the distant future.  All of their questions have been answered.     Valentina Gu. Roxy Manns, MD 06/07/2019 3:49 PM

## 2019-06-07 NOTE — Progress Notes (Signed)
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  She was originally seen in consultation on May 20, 2019.  She reports no new problems or complaints since that time and she specifically denies any symptoms of fever, cough, loss of sense of smell or taste, or exposure to any persons with known or suspected COVID-19 infection.   Current Outpatient Medications  Medication Sig Dispense Refill   amLODipine (NORVASC) 10 MG tablet Take 1 tablet (10 mg total) by mouth daily. 90 tablet 3   aspirin 81 MG EC tablet TAKE 1 TABLET BY MOUTH EVERY DAY (Patient taking differently: Take 81 mg by mouth daily. ) 100 tablet 1   atorvastatin (LIPITOR) 40 MG tablet Take 1 tablet (40 mg total) by mouth daily. 90 tablet 3   Cholecalciferol (VITAMIN D3) 2000 UNITS TABS Take 2,000 Units by mouth daily. (Patient taking differently: Take 2,000 Units by mouth 2 (two) times a week. ) 30 tablet 11   DULoxetine (CYMBALTA) 60 MG capsule Take 1 capsule (60 mg total) by mouth daily. 30 capsule 2   hydrochlorothiazide (HYDRODIURIL) 25 MG tablet Take 1 tablet (25 mg total) by mouth daily. 90 tablet 3   hydrOXYzine (VISTARIL) 25 MG capsule Take 1 capsule (25 mg total) by mouth at bedtime as needed for anxiety. 30 capsule 2   lisinopril (PRINIVIL,ZESTRIL) 40 MG tablet Take 1 tablet (40 mg total) by mouth  daily. 90 tablet 3   Lurasidone HCl 60 MG TABS Take 1 tablet (60 mg total) by mouth daily with breakfast. 30 tablet 2   metFORMIN (GLUCOPHAGE) 500 MG tablet Take 1 tablet (500 mg total) by mouth daily with breakfast. 90 tablet 3   methocarbamol (ROBAXIN) 750 MG tablet Take 1 tablet (750 mg total) by mouth every 6 (six) hours as needed for muscle spasms. 50 tablet 1   montelukast (SINGULAIR) 10 MG tablet Take 1 tablet (10 mg total) by mouth at bedtime. (Patient taking differently: Take 10 mg by mouth daily. ) 30 tablet 3   triamcinolone (NASACORT AQ) 55 MCG/ACT AERO nasal inhaler Place 2 sprays into the nose daily. 1 Inhaler 12   oxyCODONE 10 MG TABS Take 1 tablet (10 mg total) by mouth every 4 (four) hours as needed for severe pain ((score 7 to 10)). (Patient not taking: Reported on 05/31/2019) 30 tablet 0   No current facility-administered medications for this visit.    Facility-Administered Medications Ordered in Other Visits  Medication Dose Route Frequency Provider Last Rate Last Dose   [START ON 06/08/2019] cefUROXime (ZINACEF) 1.5 g in sodium chloride 0.9 % 100 mL IVPB  1.5 g Intravenous To OR [name], MD       [START ON 06/08/2019] cefUROXime (ZINACEF) 750 mg in sodium chloride 0.9 % 100 mL IVPB  750 mg Intravenous To OR Darylene Price  H, MD       [START ON 06/08/2019] dexmedetomidine (PRECEDEX) 400 MCG/100ML (4 mcg/mL) infusion  0.1-0.7 mcg/kg/hr Intravenous To OR [name], MD       [START ON 06/08/2019] DOPamine (INTROPIN) 800 mg in dextrose 5 % 250 mL (3.2 mg/mL) infusion  0-10 mcg/kg/min Intravenous To OR [name], MD       [START ON 06/08/2019] EPINEPHrine (ADRENALIN) 4 mg in NS 250 mL (0.016 mg/mL) premix infusion  0-10 mcg/min Intravenous To OR [name], MD       [START ON 06/08/2019] glutaraldehyde 0.625% cardiac soaking solution   Topical To OR [name], MD       [START ON 06/08/2019] heparin 2,500 Units, papaverine 30 mg in  electrolyte-148 (PLASMALYTE-148) 500 mL irrigation   Irrigation To OR [name], MD       [START ON 06/08/2019] heparin 30,000 units/NS 1000 mL solution for CELLSAVER   Other To OR [name], MD       [START ON 06/08/2019] insulin regular, human (MYXREDLIN) 100 units/ 100 mL infusion   Intravenous To OR [name], MD       [START ON 06/08/2019] [place] Blood Cardioplegia vial (lidocaine/magnesium/mannitol 0.26g-4g-6.4g)   Intracoronary Once [name], MD       [START ON 06/08/2019] magnesium sulfate (IV Push/IM) injection 40 mEq  40 mEq Other To OR [name], MD       [START ON 06/08/2019] milrinone (PRIMACOR) 20 MG/100 ML (0.2 mg/mL) infusion  0.3 mcg/kg/min Intravenous To OR [name], MD       [START ON 06/08/2019] nitroGLYCERIN 50 mg in dextrose 5 % 250 mL (0.2 mg/mL) infusion  2-200 mcg/min Intravenous To OR [name], MD       [START ON 06/08/2019] norepinephrine (LEVOPHED) 4mg  in 227mL premix infusion  0-40 mcg/min Intravenous To OR [name], MD       [START ON 06/08/2019] phenylephrine (NEOSYNEPHRINE) 20-0.9 MG/250ML-% infusion  30-200 mcg/min Intravenous To OR [name], MD       [START ON 06/08/2019] potassium chloride injection 80 mEq  80 mEq Other To OR [name], MD       [START ON 06/08/2019] tranexamic acid (CYKLOKAPRON) 2,500 mg in sodium chloride 0.9 % 250 mL (10 mg/mL) infusion  1.5 mg/kg/hr Intravenous To OR [name], MD       [START ON 06/08/2019] tranexamic acid (CYKLOKAPRON) bolus via infusion - over 30 minutes 1,600.5 mg  15 mg/kg Intravenous To OR [name], MD       [START ON 06/08/2019] tranexamic acid (CYKLOKAPRON) pump prime solution 213 mg  2 mg/kg Intracatheter To OR [name], MD       [START ON 06/08/2019] vancomycin (VANCOCIN) 1,000 mg in sodium chloride 0.9 % 1,000 mL irrigation   Irrigation To OR [name], MD       [START ON 06/08/2019] vancomycin (VANCOCIN) 1,500  mg in sodium chloride 0.9 % 250 mL IVPB  1,500 mg Intravenous To OR [name], MD          Physical Exam:   BP 135/86 (BP Location: Right Arm, Patient Position: Sitting, Cuff Size: Large)    Pulse 80    Temp (!) 96.3 F (35.7 C)    Resp 16    Ht 5\' 2"  (1.575 m)    Wt 235 lb (106.6 kg)    LMP 09/01/2013  SpO2 98% Comment: RA   BMI 42.98 kg/m   General:  Well-appearing  Chest:   Clear to auscultation  CV:   Regular rate and rhythm  Incisions:  n/a  Abdomen:  Soft nontender  Extremities:  Warm and well-perfused  Diagnostic Tests:  CT ANGIOGRAPHY CHEST, ABDOMEN AND PELVIS  TECHNIQUE: Multidetector CT imaging through the chest, abdomen and pelvis was performed using the standard protocol during bolus administration of intravenous contrast. Multiplanar reconstructed images and MIPs were obtained and reviewed to evaluate the vascular anatomy.  CONTRAST:  36mL ISOVUE-370 IOPAMIDOL (ISOVUE-370) INJECTION 76%  COMPARISON:  CT scan of February 11, 2019.  FINDINGS: CTA CHEST FINDINGS  Cardiovascular: There is no evidence of thoracic aortic dissection or aneurysm. Great vessels are widely patent without significant stenosis. 2.5 cm left atrial mass is noted most consistent with myxoma. Normal cardiac size. No pericardial effusion is noted.  Mediastinum/Nodes: No enlarged mediastinal, hilar, or axillary lymph nodes. Thyroid gland, trachea, and esophagus demonstrate no significant findings.  Lungs/Pleura: Lungs are clear. No pleural effusion or pneumothorax.  Musculoskeletal: No chest wall abnormality. No acute or significant osseous findings.  Review of the MIP images confirms the above findings.  CTA ABDOMEN AND PELVIS FINDINGS  VASCULAR  Aorta: Normal caliber aorta without aneurysm, dissection, vasculitis or significant stenosis.  Celiac: Patent without evidence of aneurysm, dissection, vasculitis or significant stenosis.  SMA: Patent without  evidence of aneurysm, dissection, vasculitis or significant stenosis.  Renals: Both renal arteries are patent without evidence of aneurysm, dissection, vasculitis, fibromuscular dysplasia or significant stenosis.  IMA: Patent without evidence of aneurysm, dissection, vasculitis or significant stenosis.  Inflow: Patent without evidence of aneurysm, dissection, vasculitis or significant stenosis.  Veins: No obvious venous abnormality within the limitations of this arterial phase study.  Review of the MIP images confirms the above findings.  NON-VASCULAR  Hepatobiliary: No focal liver abnormality is seen. No gallstones, gallbladder wall thickening, or biliary dilatation.  Pancreas: Possible 1.4 cm cyst seen in pancreatic body.  Spleen: Normal in size without focal abnormality.  Adrenals/Urinary Tract: Adrenal glands are unremarkable. Kidneys are normal, without renal calculi, focal lesion, or hydronephrosis. Bladder is unremarkable.  Stomach/Bowel: Stomach is within normal limits. Appendix appears normal. No evidence of bowel wall thickening, distention, or inflammatory changes.  Lymphatic: No significant vascular findings are present. No enlarged abdominal or pelvic lymph nodes.  Reproductive: Status post hysterectomy. No adnexal masses.  Other: Small fat containing periumbilical hernia is noted. No ascites is noted.  Musculoskeletal: No acute or significant osseous findings.  Review of the MIP images confirms the above findings.  IMPRESSION: No evidence of thoracic or abdominal aortic dissection or aneurysm.  Great vessels and mesenteric and renal arteries are widely patent without significant stenosis.  2.5 cm left atrial pedunculated mass is noted most consistent with myxoma.  Possible 1.4 cm cyst seen in pancreatic body. Recommend follow up pre and post contrast MRI/MRCP or pancreatic protocol CT in 1 year. This recommendation follows  ACR consensus guidelines: Management of Incidental Pancreatic Cysts: A White Paper of the ACR Incidental Findings Committee. [place] [id number].   Electronically Signed   By: Marijo Conception M.D.   On: 05/31/2019 14:35    CHEST - 2 VIEW  COMPARISON:  November 21, 2018  FINDINGS: Lungs are clear. Heart is upper normal in size with pulmonary vascularity normal. No adenopathy. No bone lesions.  IMPRESSION: No edema or consolidation. Heart upper normal in size with normal pulmonary vascularity.  Electronically Signed   By: Lowella Grip III M.D.   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  She remains essentially asymptomatic although she does report occasional symptoms of atypical chest pain and longstanding symptoms of mild exertional shortness of breath.  I have personally reviewed the patient's recent echocardiograms which reveal findings consistent with benign atrial myxoma.  CT angiography of the chest, abdomen, and pelvis revealed no contraindications to peripheral cannulation for surgery.  The patient does have a small cystic lesion in the pancreas which likely represents a benign cyst but may require follow-up in the distant future.    Plan:  I have again described the nature and the pathophysiology of benign atrial myxoma with the patient and her husband in the office today.  Indications for surgical removal were discussed.  Risks of embolization or other complications related to continued observation without surgical intervention were discussed.  Alternative surgical approaches were discussed including a comparison between conventional median sternotomy and minithoracotomy approach were discussed.  Expectations for her postoperative convalescence have been discussed.  They understand and accept all potential risks of  surgery including but not limited to risk of death, stroke or other neurologic complication, myocardial infarction, congestive heart failure, respiratory failure, renal failure, bleeding requiring transfusion and/or reexploration, arrhythmia, infection or other wound complications, pneumonia, pleural and/or pericardial effusion, pulmonary embolus, aortic dissection or other major vascular complication, or delayed complications related to valve repair or replacement including but not limited to structural valve deterioration and failure, thrombosis, embolization, endocarditis, or paravalvular leak.  Specific risks potentially related to the minimally-invasive approach were discussed at length, including but not limited to risk of conversion to full or partial sternotomy, aortic dissection or other major vascular complication, unilateral acute lung injury or pulmonary edema, phrenic nerve dysfunction or paralysis, rib fracture, chronic pain, lung hernia, or lymphocele.  We also discussed the finding of the benign appearing cystic mass in the pancreas on recent CT imaging and the need for follow-up in the distant future.  All of their questions have been answered.   I spent in excess of 15 minutes during the conduct of this office consultation and >50% of this time involved direct face-to-face encounter with the patient for counseling and/or coordination of their care.   Valentina Gu. Roxy Manns, MD 06/07/2019 3:49 PM

## 2019-06-08 ENCOUNTER — Inpatient Hospital Stay (HOSPITAL_COMMUNITY): Payer: Medicare Other

## 2019-06-08 ENCOUNTER — Inpatient Hospital Stay (HOSPITAL_COMMUNITY)
Admission: RE | Admit: 2019-06-08 | Discharge: 2019-06-14 | DRG: 229 | Disposition: A | Discharge status: Home / Self Care | Payer: Medicare Other | Attending: Thoracic Surgery (Cardiothoracic Vascular Surgery) | Admitting: Thoracic Surgery (Cardiothoracic Vascular Surgery)

## 2019-06-08 ENCOUNTER — Inpatient Hospital Stay (HOSPITAL_COMMUNITY): Payer: Medicare Other | Admitting: Certified Registered Nurse Anesthetist

## 2019-06-08 ENCOUNTER — Encounter (HOSPITAL_COMMUNITY): Payer: Self-pay | Admitting: Certified Registered"

## 2019-06-08 ENCOUNTER — Encounter (HOSPITAL_COMMUNITY)
Admission: RE | Disposition: A | Discharge status: Home / Self Care | Payer: Self-pay | Source: Home / Self Care | Attending: Thoracic Surgery (Cardiothoracic Vascular Surgery)

## 2019-06-08 ENCOUNTER — Inpatient Hospital Stay (HOSPITAL_COMMUNITY): Payer: Medicare Other | Admitting: Physician Assistant

## 2019-06-08 DIAGNOSIS — Z818 Family history of other mental and behavioral disorders: Secondary | ICD-10-CM | POA: Diagnosis not present

## 2019-06-08 DIAGNOSIS — E785 Hyperlipidemia, unspecified: Secondary | ICD-10-CM | POA: Diagnosis present

## 2019-06-08 DIAGNOSIS — F329 Major depressive disorder, single episode, unspecified: Secondary | ICD-10-CM | POA: Diagnosis present

## 2019-06-08 DIAGNOSIS — Z885 Allergy status to narcotic agent status: Secondary | ICD-10-CM | POA: Diagnosis not present

## 2019-06-08 DIAGNOSIS — Z7984 Long term (current) use of oral hypoglycemic drugs: Secondary | ICD-10-CM | POA: Diagnosis not present

## 2019-06-08 DIAGNOSIS — M5030 Other cervical disc degeneration, unspecified cervical region: Secondary | ICD-10-CM | POA: Diagnosis present

## 2019-06-08 DIAGNOSIS — I10 Essential (primary) hypertension: Secondary | ICD-10-CM | POA: Diagnosis present

## 2019-06-08 DIAGNOSIS — I081 Rheumatic disorders of both mitral and tricuspid valves: Secondary | ICD-10-CM | POA: Diagnosis not present

## 2019-06-08 DIAGNOSIS — Z86018 Personal history of other benign neoplasm: Secondary | ICD-10-CM

## 2019-06-08 DIAGNOSIS — D62 Acute posthemorrhagic anemia: Secondary | ICD-10-CM | POA: Diagnosis not present

## 2019-06-08 DIAGNOSIS — I447 Left bundle-branch block, unspecified: Secondary | ICD-10-CM | POA: Diagnosis present

## 2019-06-08 DIAGNOSIS — D151 Benign neoplasm of heart: Secondary | ICD-10-CM | POA: Diagnosis not present

## 2019-06-08 DIAGNOSIS — D219 Benign neoplasm of connective and other soft tissue, unspecified: Secondary | ICD-10-CM

## 2019-06-08 DIAGNOSIS — Z7982 Long term (current) use of aspirin: Secondary | ICD-10-CM | POA: Diagnosis not present

## 2019-06-08 DIAGNOSIS — E119 Type 2 diabetes mellitus without complications: Secondary | ICD-10-CM | POA: Diagnosis not present

## 2019-06-08 DIAGNOSIS — Z6841 Body Mass Index (BMI) 40.0 and over, adult: Secondary | ICD-10-CM | POA: Diagnosis not present

## 2019-06-08 DIAGNOSIS — E872 Acidosis: Secondary | ICD-10-CM | POA: Diagnosis not present

## 2019-06-08 DIAGNOSIS — J9811 Atelectasis: Secondary | ICD-10-CM | POA: Diagnosis not present

## 2019-06-08 DIAGNOSIS — Z91011 Allergy to milk products: Secondary | ICD-10-CM

## 2019-06-08 DIAGNOSIS — G8929 Other chronic pain: Secondary | ICD-10-CM | POA: Diagnosis present

## 2019-06-08 DIAGNOSIS — Z7951 Long term (current) use of inhaled steroids: Secondary | ICD-10-CM | POA: Diagnosis not present

## 2019-06-08 DIAGNOSIS — Z8249 Family history of ischemic heart disease and other diseases of the circulatory system: Secondary | ICD-10-CM

## 2019-06-08 DIAGNOSIS — G4733 Obstructive sleep apnea (adult) (pediatric): Secondary | ICD-10-CM | POA: Diagnosis not present

## 2019-06-08 DIAGNOSIS — I340 Nonrheumatic mitral (valve) insufficiency: Secondary | ICD-10-CM | POA: Diagnosis present

## 2019-06-08 HISTORY — PX: TEE WITHOUT CARDIOVERSION: SHX5443

## 2019-06-08 HISTORY — PX: MINIMALLY INVASIVE EXCISION OF ATRIAL MYXOMA: SHX5974

## 2019-06-08 HISTORY — DX: Personal history of other benign neoplasm: Z86.018

## 2019-06-08 LAB — BASIC METABOLIC PANEL
Anion gap: 9 (ref 5–15)
BUN: 9 mg/dL (ref 6–20)
CO2: 23 mmol/L (ref 22–32)
Calcium: 7.6 mg/dL — ABNORMAL LOW (ref 8.9–10.3)
Chloride: 105 mmol/L (ref 98–111)
Creatinine, Ser: 0.68 mg/dL (ref 0.44–1.00)
GFR calc Af Amer: >60 mL/min (ref >60)
GFR calc non Af Amer: >60 mL/min (ref >60)
Glucose, Bld: 132 mg/dL — ABNORMAL HIGH (ref 70–99)
Potassium: 3.3 mmol/L — ABNORMAL LOW (ref 3.5–5.1)
Sodium: 137 mmol/L (ref 135–145)

## 2019-06-08 LAB — POCT I-STAT 7, (LYTES, BLD GAS, ICA,H+H)
Acid-Base Excess: 4 mmol/L — ABNORMAL HIGH (ref 0.0–2.0)
Acid-Base Excess: 1 mmol/L (ref 0.0–2.0)
Acid-base deficit: 2 mmol/L (ref 0.0–2.0)
Acid-base deficit: 4 mmol/L — ABNORMAL HIGH (ref 0.0–2.0)
Bicarbonate: 28.7 mmol/L — ABNORMAL HIGH (ref 20.0–28.0)
Bicarbonate: 26.4 mmol/L (ref 20.0–28.0)
Bicarbonate: 28.9 mmol/L — ABNORMAL HIGH (ref 20.0–28.0)
Bicarbonate: 25.4 mmol/L (ref 20.0–28.0)
Bicarbonate: 20.9 mmol/L (ref 20.0–28.0)
Calcium, Ion: 1.04 mmol/L — ABNORMAL LOW (ref 1.15–1.40)
Calcium, Ion: 1.05 mmol/L — ABNORMAL LOW (ref 1.15–1.40)
Calcium, Ion: 1.13 mmol/L — ABNORMAL LOW (ref 1.15–1.40)
Calcium, Ion: 1.08 mmol/L — ABNORMAL LOW (ref 1.15–1.40)
Calcium, Ion: 1.03 mmol/L — ABNORMAL LOW (ref 1.15–1.40)
HCT: 26 % — ABNORMAL LOW (ref 36.0–46.0)
HCT: 26 % — ABNORMAL LOW (ref 36.0–46.0)
HCT: 33 % — ABNORMAL LOW (ref 36.0–46.0)
HCT: 32 % — ABNORMAL LOW (ref 36.0–46.0)
HCT: 28 % — ABNORMAL LOW (ref 36.0–46.0)
Hemoglobin: 8.8 g/dL — ABNORMAL LOW (ref 12.0–15.0)
Hemoglobin: 8.8 g/dL — ABNORMAL LOW (ref 12.0–15.0)
Hemoglobin: 11.2 g/dL — ABNORMAL LOW (ref 12.0–15.0)
Hemoglobin: 10.9 g/dL — ABNORMAL LOW (ref 12.0–15.0)
Hemoglobin: 9.5 g/dL — ABNORMAL LOW (ref 12.0–15.0)
O2 Saturation: 100 %
O2 Saturation: 100 %
O2 Saturation: 96 %
O2 Saturation: 97 %
O2 Saturation: 99 %
Patient temperature: 36.9
Patient temperature: 37.3
Patient temperature: 37.5
Potassium: 2.7 mmol/L — CL (ref 3.5–5.1)
Potassium: 3 mmol/L — ABNORMAL LOW (ref 3.5–5.1)
Potassium: 3.5 mmol/L (ref 3.5–5.1)
Potassium: 3.1 mmol/L — ABNORMAL LOW (ref 3.5–5.1)
Potassium: 2.9 mmol/L — ABNORMAL LOW (ref 3.5–5.1)
Sodium: 143 mmol/L (ref 135–145)
Sodium: 143 mmol/L (ref 135–145)
Sodium: 143 mmol/L (ref 135–145)
Sodium: 144 mmol/L (ref 135–145)
Sodium: 144 mmol/L (ref 135–145)
TCO2: 30 mmol/L (ref 22–32)
TCO2: 28 mmol/L (ref 22–32)
TCO2: 31 mmol/L (ref 22–32)
TCO2: 27 mmol/L (ref 22–32)
TCO2: 22 mmol/L (ref 22–32)
pCO2 arterial: 43 mmHg (ref 32.0–48.0)
pCO2 arterial: 42.8 mmHg (ref 32.0–48.0)
pCO2 arterial: 68.1 mmHg (ref 32.0–48.0)
pCO2 arterial: 55.3 mmHg — ABNORMAL HIGH (ref 32.0–48.0)
pCO2 arterial: 39.1 mmHg (ref 32.0–48.0)
pH, Arterial: 7.433 (ref 7.350–7.450)
pH, Arterial: 7.398 (ref 7.350–7.450)
pH, Arterial: 7.236 — ABNORMAL LOW (ref 7.350–7.450)
pH, Arterial: 7.271 — ABNORMAL LOW (ref 7.350–7.450)
pH, Arterial: 7.337 — ABNORMAL LOW (ref 7.350–7.450)
pO2, Arterial: 394 mmHg — ABNORMAL HIGH (ref 83.0–108.0)
pO2, Arterial: 246 mmHg — ABNORMAL HIGH (ref 83.0–108.0)
pO2, Arterial: 98 mmHg (ref 83.0–108.0)
pO2, Arterial: 112 mmHg — ABNORMAL HIGH (ref 83.0–108.0)
pO2, Arterial: 127 mmHg — ABNORMAL HIGH (ref 83.0–108.0)

## 2019-06-08 LAB — GLUCOSE, CAPILLARY
Glucose-Capillary: 98 mg/dL (ref 70–99)
Glucose-Capillary: 124 mg/dL — ABNORMAL HIGH (ref 70–99)
Glucose-Capillary: 115 mg/dL — ABNORMAL HIGH (ref 70–99)
Glucose-Capillary: 112 mg/dL — ABNORMAL HIGH (ref 70–99)
Glucose-Capillary: 122 mg/dL — ABNORMAL HIGH (ref 70–99)
Glucose-Capillary: 122 mg/dL — ABNORMAL HIGH (ref 70–99)
Glucose-Capillary: 120 mg/dL — ABNORMAL HIGH (ref 70–99)

## 2019-06-08 LAB — PLATELET COUNT: Platelets: 248 10*3/uL (ref 150–400)

## 2019-06-08 LAB — CBC
HCT: 31.8 % — ABNORMAL LOW (ref 36.0–46.0)
HCT: 31.7 % — ABNORMAL LOW (ref 36.0–46.0)
Hemoglobin: 9.5 g/dL — ABNORMAL LOW (ref 12.0–15.0)
Hemoglobin: 9.9 g/dL — ABNORMAL LOW (ref 12.0–15.0)
MCH: 24.2 pg — ABNORMAL LOW (ref 26.0–34.0)
MCH: 24.8 pg — ABNORMAL LOW (ref 26.0–34.0)
MCHC: 29.9 g/dL — ABNORMAL LOW (ref 30.0–36.0)
MCHC: 31.2 g/dL (ref 30.0–36.0)
MCV: 80.9 fL (ref 80.0–100.0)
MCV: 79.3 fL — ABNORMAL LOW (ref 80.0–100.0)
Platelets: 284 10*3/uL (ref 150–400)
Platelets: 263 10*3/uL (ref 150–400)
RBC: 3.93 MIL/uL (ref 3.87–5.11)
RBC: 4 MIL/uL (ref 3.87–5.11)
RDW: 13.7 % (ref 11.5–15.5)
RDW: 13.7 % (ref 11.5–15.5)
WBC: 18.4 10*3/uL — ABNORMAL HIGH (ref 4.0–10.5)
WBC: 13.5 10*3/uL — ABNORMAL HIGH (ref 4.0–10.5)
nRBC: 0 % (ref 0.0–0.2)
nRBC: 0 % (ref 0.0–0.2)

## 2019-06-08 LAB — POCT I-STAT, CHEM 8
BUN: 9 mg/dL (ref 6–20)
Calcium, Ion: 1.06 mmol/L — ABNORMAL LOW (ref 1.15–1.40)
Chloride: 105 mmol/L (ref 98–111)
Creatinine, Ser: 0.6 mg/dL (ref 0.44–1.00)
Glucose, Bld: 130 mg/dL — ABNORMAL HIGH (ref 70–99)
HCT: 30 % — ABNORMAL LOW (ref 36.0–46.0)
Hemoglobin: 10.2 g/dL — ABNORMAL LOW (ref 12.0–15.0)
Potassium: 3.3 mmol/L — ABNORMAL LOW (ref 3.5–5.1)
Sodium: 142 mmol/L (ref 135–145)
TCO2: 24 mmol/L (ref 22–32)

## 2019-06-08 LAB — POCT I-STAT 4, (NA,K, GLUC, HGB,HCT)
Glucose, Bld: 101 mg/dL — ABNORMAL HIGH (ref 70–99)
Glucose, Bld: 113 mg/dL — ABNORMAL HIGH (ref 70–99)
Glucose, Bld: 117 mg/dL — ABNORMAL HIGH (ref 70–99)
Glucose, Bld: 139 mg/dL — ABNORMAL HIGH (ref 70–99)
Glucose, Bld: 136 mg/dL — ABNORMAL HIGH (ref 70–99)
Glucose, Bld: 129 mg/dL — ABNORMAL HIGH (ref 70–99)
HCT: 31 % — ABNORMAL LOW (ref 36.0–46.0)
HCT: 24 % — ABNORMAL LOW (ref 36.0–46.0)
HCT: 27 % — ABNORMAL LOW (ref 36.0–46.0)
HCT: 26 % — ABNORMAL LOW (ref 36.0–46.0)
HCT: 25 % — ABNORMAL LOW (ref 36.0–46.0)
HCT: 30 % — ABNORMAL LOW (ref 36.0–46.0)
Hemoglobin: 10.5 g/dL — ABNORMAL LOW (ref 12.0–15.0)
Hemoglobin: 8.2 g/dL — ABNORMAL LOW (ref 12.0–15.0)
Hemoglobin: 9.2 g/dL — ABNORMAL LOW (ref 12.0–15.0)
Hemoglobin: 8.8 g/dL — ABNORMAL LOW (ref 12.0–15.0)
Hemoglobin: 8.5 g/dL — ABNORMAL LOW (ref 12.0–15.0)
Hemoglobin: 10.2 g/dL — ABNORMAL LOW (ref 12.0–15.0)
Potassium: 2.6 mmol/L — CL (ref 3.5–5.1)
Potassium: 2.6 mmol/L — CL (ref 3.5–5.1)
Potassium: 2.7 mmol/L — CL (ref 3.5–5.1)
Potassium: 2.9 mmol/L — ABNORMAL LOW (ref 3.5–5.1)
Potassium: 2.9 mmol/L — ABNORMAL LOW (ref 3.5–5.1)
Potassium: 4 mmol/L (ref 3.5–5.1)
Sodium: 143 mmol/L (ref 135–145)
Sodium: 141 mmol/L (ref 135–145)
Sodium: 142 mmol/L (ref 135–145)
Sodium: 142 mmol/L (ref 135–145)
Sodium: 143 mmol/L (ref 135–145)
Sodium: 142 mmol/L (ref 135–145)

## 2019-06-08 LAB — HEMOGLOBIN AND HEMATOCRIT, BLOOD
HCT: 25.1 % — ABNORMAL LOW (ref 36.0–46.0)
Hemoglobin: 8 g/dL — ABNORMAL LOW (ref 12.0–15.0)

## 2019-06-08 LAB — PROTIME-INR
INR: 1.5 — ABNORMAL HIGH (ref 0.8–1.2)
Prothrombin Time: 17.8 seconds — ABNORMAL HIGH (ref 11.4–15.2)

## 2019-06-08 LAB — ECHO INTRAOPERATIVE TEE

## 2019-06-08 LAB — MAGNESIUM: Magnesium: 3.1 mg/dL — ABNORMAL HIGH (ref 1.7–2.4)

## 2019-06-08 LAB — APTT: aPTT: 36 seconds (ref 24–36)

## 2019-06-08 SURGERY — EXCISION, MYXOMA, CARDIAC ATRIUM, MINIMALLY INVASIVE
Anesthesia: General | Site: Chest

## 2019-06-08 MED ORDER — ALBUMIN HUMAN 5 % IV SOLN: 250.0000 mL | INTRAVENOUS | Status: DC | PRN

## 2019-06-08 MED ORDER — CHLORHEXIDINE GLUCONATE CLOTH 2 % EX PADS
6.0000 | MEDICATED_PAD | Freq: Every day | CUTANEOUS | Status: DC
  Administered 2019-06-08 – 2019-06-14 (×6): 6 via TOPICAL

## 2019-06-08 MED ORDER — CHLORHEXIDINE GLUCONATE 0.12 % MT SOLN: 15.0000 mL | OROMUCOSAL | Status: DC

## 2019-06-08 MED ORDER — SODIUM CHLORIDE 0.9 % IV SOLN
INTRAVENOUS | Status: DC | PRN
  Administered 2019-06-08: 08:00:00 50 ug/min via INTRAVENOUS

## 2019-06-08 MED ORDER — PROTAMINE SULFATE 10 MG/ML IV SOLN
INTRAVENOUS | Status: AC
  Filled 2019-06-08: qty 10

## 2019-06-08 MED ORDER — METOPROLOL TARTRATE 5 MG/5ML IV SOLN: 2.5000 mg | INTRAVENOUS | Status: DC | PRN

## 2019-06-08 MED ORDER — ONDANSETRON HCL 4 MG/2ML IJ SOLN: 4.0000 mg | Freq: Four times a day (QID) | INTRAMUSCULAR | Status: DC | PRN

## 2019-06-08 MED ORDER — 0.9 % SODIUM CHLORIDE (POUR BTL) OPTIME
TOPICAL | Status: DC | PRN
  Administered 2019-06-08: 07:00:00 5000 mL
  Administered 2019-06-08: 1000 mL

## 2019-06-08 MED ORDER — BISACODYL 5 MG PO TBEC
10.0000 mg | DELAYED_RELEASE_TABLET | Freq: Every day | ORAL | Status: DC
  Administered 2019-06-09 – 2019-06-11 (×3): 10 mg via ORAL
  Filled 2019-06-08 (×5): qty 2

## 2019-06-08 MED ORDER — NITROGLYCERIN IN D5W 200-5 MCG/ML-% IV SOLN
0.0000 ug/min | INTRAVENOUS | Status: DC
  Administered 2019-06-08: 14:00:00 0 ug/min via INTRAVENOUS
  Administered 2019-06-08: 21:00:00 10 ug/min via INTRAVENOUS

## 2019-06-08 MED ORDER — DEXMEDETOMIDINE HCL IN NACL 200 MCG/50ML IV SOLN: 0.0000 ug/kg/h | INTRAVENOUS | Status: DC

## 2019-06-08 MED ORDER — ROCURONIUM BROMIDE 10 MG/ML (PF) SYRINGE
PREFILLED_SYRINGE | INTRAVENOUS | Status: AC
  Filled 2019-06-08: qty 10

## 2019-06-08 MED ORDER — TRANEXAMIC ACID 1000 MG/10ML IV SOLN
INTRAVENOUS | Status: DC | PRN
  Administered 2019-06-08: 13:00:00 via INTRAVENOUS
  Administered 2019-06-08: 09:00:00 1.5 mg/kg/h via INTRAVENOUS

## 2019-06-08 MED ORDER — SODIUM CHLORIDE 0.9% FLUSH
10.0000 mL | Freq: Two times a day (BID) | INTRAVENOUS | Status: DC
  Administered 2019-06-08 – 2019-06-13 (×5): 10 mL

## 2019-06-08 MED ORDER — PHENYLEPHRINE 40 MCG/ML (10ML) SYRINGE FOR IV PUSH (FOR BLOOD PRESSURE SUPPORT)
PREFILLED_SYRINGE | INTRAVENOUS | Status: AC
  Filled 2019-06-08: qty 10

## 2019-06-08 MED ORDER — BISACODYL 10 MG RE SUPP
10.0000 mg | Freq: Every day | RECTAL | Status: DC
  Filled 2019-06-08: qty 1

## 2019-06-08 MED ORDER — PROPOFOL 10 MG/ML IV BOLUS
INTRAVENOUS | Status: AC
  Filled 2019-06-08: qty 20

## 2019-06-08 MED ORDER — FENTANYL CITRATE (PF) 250 MCG/5ML IJ SOLN
INTRAMUSCULAR | Status: AC
  Filled 2019-06-08: qty 5

## 2019-06-08 MED ORDER — POTASSIUM CHLORIDE 10 MEQ/50ML IV SOLN: 10.0000 meq | INTRAVENOUS | Status: AC

## 2019-06-08 MED ORDER — ACETAMINOPHEN 160 MG/5ML PO SOLN: 1000.0000 mg | Freq: Four times a day (QID) | ORAL | Status: DC

## 2019-06-08 MED ORDER — ONDANSETRON HCL 4 MG/2ML IJ SOLN
INTRAMUSCULAR | Status: DC | PRN
  Administered 2019-06-08: 4 mg via INTRAVENOUS

## 2019-06-08 MED ORDER — ACETAMINOPHEN 160 MG/5ML PO SOLN: 650.0000 mg | Freq: Once | ORAL | Status: AC

## 2019-06-08 MED ORDER — LACTATED RINGERS IV SOLN: INTRAVENOUS | Status: DC

## 2019-06-08 MED ORDER — PROTAMINE SULFATE 10 MG/ML IV SOLN
INTRAVENOUS | Status: AC
  Filled 2019-06-08: qty 5

## 2019-06-08 MED ORDER — LACTATED RINGERS IV SOLN
INTRAVENOUS | Status: DC
  Administered 2019-06-09: 05:00:00 via INTRAVENOUS

## 2019-06-08 MED ORDER — HEPARIN SODIUM (PORCINE) 1000 UNIT/ML IJ SOLN
INTRAMUSCULAR | Status: AC
  Filled 2019-06-08: qty 1

## 2019-06-08 MED ORDER — LACTATED RINGERS IV SOLN
INTRAVENOUS | Status: DC | PRN
  Administered 2019-06-08 (×2): via INTRAVENOUS

## 2019-06-08 MED ORDER — SUCCINYLCHOLINE CHLORIDE 200 MG/10ML IV SOSY
PREFILLED_SYRINGE | INTRAVENOUS | Status: AC
  Filled 2019-06-08: qty 10

## 2019-06-08 MED ORDER — SODIUM CHLORIDE 0.9 % IV SOLN
INTRAVENOUS | Status: AC
  Administered 2019-06-08: 14:00:00 via INTRAVENOUS

## 2019-06-08 MED ORDER — GLYCOPYRROLATE PF 0.2 MG/ML IJ SOSY
PREFILLED_SYRINGE | INTRAMUSCULAR | Status: AC
  Filled 2019-06-08: qty 1

## 2019-06-08 MED ORDER — DOCUSATE SODIUM 100 MG PO CAPS
200.0000 mg | ORAL_CAPSULE | Freq: Every day | ORAL | Status: DC
  Administered 2019-06-09 – 2019-06-11 (×3): 200 mg via ORAL
  Filled 2019-06-08 (×4): qty 2

## 2019-06-08 MED ORDER — ASPIRIN 81 MG PO CHEW: 324.0000 mg | CHEWABLE_TABLET | Freq: Every day | ORAL | Status: DC

## 2019-06-08 MED ORDER — SODIUM CHLORIDE 0.9% FLUSH: 10.0000 mL | INTRAVENOUS | Status: DC | PRN

## 2019-06-08 MED ORDER — SODIUM CHLORIDE 0.9% FLUSH: 3.0000 mL | INTRAVENOUS | Status: DC | PRN

## 2019-06-08 MED ORDER — METOPROLOL TARTRATE 12.5 MG HALF TABLET
12.5000 mg | ORAL_TABLET | Freq: Once | ORAL | Status: AC
  Administered 2019-06-08: 06:00:00 12.5 mg via ORAL

## 2019-06-08 MED ORDER — ONDANSETRON HCL 4 MG/2ML IJ SOLN
INTRAMUSCULAR | Status: AC
  Filled 2019-06-08: qty 2

## 2019-06-08 MED ORDER — PHENYLEPHRINE 40 MCG/ML (10ML) SYRINGE FOR IV PUSH (FOR BLOOD PRESSURE SUPPORT)
PREFILLED_SYRINGE | INTRAVENOUS | Status: DC | PRN
  Administered 2019-06-08 (×2): 80 ug via INTRAVENOUS

## 2019-06-08 MED ORDER — EPHEDRINE 5 MG/ML INJ
INTRAVENOUS | Status: AC
  Filled 2019-06-08: qty 10

## 2019-06-08 MED ORDER — ROCURONIUM BROMIDE 50 MG/5ML IV SOSY
PREFILLED_SYRINGE | INTRAVENOUS | Status: DC | PRN
  Administered 2019-06-08: 40 mg via INTRAVENOUS
  Administered 2019-06-08: 60 mg via INTRAVENOUS
  Administered 2019-06-08: 20 mg via INTRAVENOUS
  Administered 2019-06-08: 10 mg via INTRAVENOUS

## 2019-06-08 MED ORDER — ACETAMINOPHEN 500 MG PO TABS
1000.0000 mg | ORAL_TABLET | Freq: Four times a day (QID) | ORAL | Status: AC
  Administered 2019-06-08 – 2019-06-13 (×20): 1000 mg via ORAL
  Filled 2019-06-08 (×18): qty 2

## 2019-06-08 MED ORDER — LACTATED RINGERS IV SOLN: 500.0000 mL | Freq: Once | INTRAVENOUS | Status: DC | PRN

## 2019-06-08 MED ORDER — MIDAZOLAM HCL (PF) 10 MG/2ML IJ SOLN
INTRAMUSCULAR | Status: AC
  Filled 2019-06-08: qty 2

## 2019-06-08 MED ORDER — PHENYLEPHRINE HCL-NACL 20-0.9 MG/250ML-% IV SOLN
0.0000 ug/min | INTRAVENOUS | Status: DC
  Administered 2019-06-08: 14:00:00 0 ug/min via INTRAVENOUS

## 2019-06-08 MED ORDER — FAMOTIDINE IN NACL 20-0.9 MG/50ML-% IV SOLN
20.0000 mg | Freq: Two times a day (BID) | INTRAVENOUS | Status: DC
  Administered 2019-06-08: 20 mg via INTRAVENOUS

## 2019-06-08 MED ORDER — CHLORHEXIDINE GLUCONATE 4 % EX LIQD: 30.0000 mL | CUTANEOUS | Status: DC

## 2019-06-08 MED ORDER — SODIUM CHLORIDE 0.9 % IV SOLN
INTRAVENOUS | Status: DC | PRN
  Administered 2019-06-08: .7 ug/kg/h via INTRAVENOUS

## 2019-06-08 MED ORDER — HEPARIN SODIUM (PORCINE) 1000 UNIT/ML IJ SOLN
INTRAMUSCULAR | Status: DC | PRN
  Administered 2019-06-08: 35 mL via INTRAVENOUS

## 2019-06-08 MED ORDER — SODIUM CHLORIDE 0.9 % IV SOLN
1.5000 g | Freq: Two times a day (BID) | INTRAVENOUS | Status: AC
  Administered 2019-06-08 – 2019-06-10 (×4): 1.5 g via INTRAVENOUS
  Filled 2019-06-08 (×5): qty 1.5

## 2019-06-08 MED ORDER — MIDAZOLAM HCL 2 MG/2ML IJ SOLN: 2.0000 mg | INTRAMUSCULAR | Status: DC | PRN

## 2019-06-08 MED ORDER — SODIUM CHLORIDE 0.9 % IV SOLN: INTRAVENOUS | Status: DC

## 2019-06-08 MED ORDER — VANCOMYCIN HCL IN DEXTROSE 1-5 GM/200ML-% IV SOLN
1000.0000 mg | Freq: Once | INTRAVENOUS | Status: AC
  Administered 2019-06-08: 21:00:00 1000 mg via INTRAVENOUS
  Filled 2019-06-08: qty 200

## 2019-06-08 MED ORDER — LIDOCAINE 2% (20 MG/ML) 5 ML SYRINGE
INTRAMUSCULAR | Status: AC
  Filled 2019-06-08: qty 5

## 2019-06-08 MED ORDER — MORPHINE SULFATE (PF) 2 MG/ML IV SOLN
1.0000 mg | INTRAVENOUS | Status: DC | PRN
  Administered 2019-06-08 – 2019-06-11 (×3): 2 mg via INTRAVENOUS
  Filled 2019-06-08 (×3): qty 1

## 2019-06-08 MED ORDER — TRAMADOL HCL 50 MG PO TABS
50.0000 mg | ORAL_TABLET | ORAL | Status: DC | PRN
  Administered 2019-06-09: 50 mg via ORAL
  Administered 2019-06-09: 20:00:00 100 mg via ORAL
  Administered 2019-06-09 (×2): 50 mg via ORAL
  Administered 2019-06-10 – 2019-06-13 (×5): 100 mg via ORAL
  Filled 2019-06-08 (×3): qty 2
  Filled 2019-06-08: qty 1
  Filled 2019-06-08 (×3): qty 2
  Filled 2019-06-08: qty 1
  Filled 2019-06-08: qty 2

## 2019-06-08 MED ORDER — INSULIN REGULAR(HUMAN) IN NACL 100-0.9 UT/100ML-% IV SOLN
INTRAVENOUS | Status: DC
  Administered 2019-06-08: 14:00:00 1.5 [IU]/h via INTRAVENOUS

## 2019-06-08 MED ORDER — SODIUM CHLORIDE 0.9 % IV SOLN: INTRAVENOUS | Status: DC | PRN

## 2019-06-08 MED ORDER — PROTAMINE SULFATE 10 MG/ML IV SOLN
INTRAVENOUS | Status: AC
  Filled 2019-06-08: qty 25

## 2019-06-08 MED ORDER — MIDAZOLAM HCL 5 MG/5ML IJ SOLN
INTRAMUSCULAR | Status: DC | PRN
  Administered 2019-06-08: 2 mg via INTRAVENOUS
  Administered 2019-06-08 (×2): 1 mg via INTRAVENOUS

## 2019-06-08 MED ORDER — LACTATED RINGERS IV SOLN
INTRAVENOUS | Status: DC | PRN
  Administered 2019-06-08: 07:00:00 via INTRAVENOUS

## 2019-06-08 MED ORDER — MAGNESIUM SULFATE 4 GM/100ML IV SOLN
4.0000 g | Freq: Once | INTRAVENOUS | Status: AC
  Administered 2019-06-08: 14:00:00 4 g via INTRAVENOUS
  Filled 2019-06-08: qty 100

## 2019-06-08 MED ORDER — CHLORHEXIDINE GLUCONATE 0.12 % MT SOLN
15.0000 mL | Freq: Two times a day (BID) | OROMUCOSAL | Status: DC
  Administered 2019-06-08 – 2019-06-14 (×12): 15 mL via OROMUCOSAL
  Filled 2019-06-08 (×11): qty 15

## 2019-06-08 MED ORDER — CHLORHEXIDINE GLUCONATE 0.12 % MT SOLN
15.0000 mL | Freq: Once | OROMUCOSAL | Status: AC
  Administered 2019-06-08: 06:00:00 15 mL via OROMUCOSAL
  Filled 2019-06-08: qty 15

## 2019-06-08 MED ORDER — SUCCINYLCHOLINE CHLORIDE 200 MG/10ML IV SOSY: PREFILLED_SYRINGE | INTRAVENOUS | Status: DC | PRN

## 2019-06-08 MED ORDER — OXYCODONE HCL 5 MG PO TABS
5.0000 mg | ORAL_TABLET | ORAL | Status: DC | PRN
  Administered 2019-06-08: 20:00:00 10 mg via ORAL
  Administered 2019-06-10: 17:00:00 5 mg via ORAL
  Administered 2019-06-10: 10 mg via ORAL
  Filled 2019-06-08: qty 2
  Filled 2019-06-08: qty 1
  Filled 2019-06-08: qty 2

## 2019-06-08 MED ORDER — ASPIRIN EC 325 MG PO TBEC: 325.0000 mg | DELAYED_RELEASE_TABLET | Freq: Every day | ORAL | Status: DC

## 2019-06-08 MED ORDER — PROPOFOL 10 MG/ML IV BOLUS
INTRAVENOUS | Status: DC | PRN
  Administered 2019-06-08: 20 mg via INTRAVENOUS

## 2019-06-08 MED ORDER — DEXAMETHASONE SODIUM PHOSPHATE 10 MG/ML IJ SOLN
INTRAMUSCULAR | Status: DC | PRN
  Administered 2019-06-08: 10 mg via INTRAVENOUS

## 2019-06-08 MED ORDER — METOPROLOL TARTRATE 12.5 MG HALF TABLET
ORAL_TABLET | ORAL | Status: AC
  Administered 2019-06-08: 12.5 mg via ORAL
  Filled 2019-06-08: qty 1

## 2019-06-08 MED ORDER — ACETAMINOPHEN 650 MG RE SUPP
650.0000 mg | Freq: Once | RECTAL | Status: AC
  Administered 2019-06-08: 650 mg via RECTAL

## 2019-06-08 MED ORDER — FENTANYL CITRATE (PF) 250 MCG/5ML IJ SOLN
INTRAMUSCULAR | Status: DC | PRN
  Administered 2019-06-08: 200 ug via INTRAVENOUS
  Administered 2019-06-08: 250 ug via INTRAVENOUS
  Administered 2019-06-08: 50 ug via INTRAVENOUS
  Administered 2019-06-08: 150 ug via INTRAVENOUS
  Administered 2019-06-08: 50 ug via INTRAVENOUS

## 2019-06-08 MED ORDER — POTASSIUM CHLORIDE 10 MEQ/50ML IV SOLN
10.0000 meq | INTRAVENOUS | Status: AC
  Administered 2019-06-08 (×3): 10 meq via INTRAVENOUS
  Filled 2019-06-08: qty 50

## 2019-06-08 MED ORDER — SODIUM CHLORIDE 0.9% FLUSH
3.0000 mL | Freq: Two times a day (BID) | INTRAVENOUS | Status: DC
  Administered 2019-06-09 – 2019-06-13 (×3): 3 mL via INTRAVENOUS

## 2019-06-08 MED ORDER — MONTELUKAST SODIUM 10 MG PO TABS
10.0000 mg | ORAL_TABLET | Freq: Every day | ORAL | Status: DC
  Administered 2019-06-09 – 2019-06-14 (×6): 10 mg via ORAL
  Filled 2019-06-08 (×6): qty 1

## 2019-06-08 MED ORDER — SUGAMMADEX SODIUM 200 MG/2ML IV SOLN
INTRAVENOUS | Status: DC | PRN
  Administered 2019-06-08: 250 mg via INTRAVENOUS

## 2019-06-08 MED ORDER — GLYCOPYRROLATE PF 0.2 MG/ML IJ SOSY
PREFILLED_SYRINGE | INTRAMUSCULAR | Status: DC | PRN
  Administered 2019-06-08: .1 mg via INTRAVENOUS

## 2019-06-08 MED ORDER — PROTAMINE SULFATE 10 MG/ML IV SOLN
INTRAVENOUS | Status: DC | PRN
  Administered 2019-06-08: 200 mg via INTRAVENOUS
  Administered 2019-06-08: 120 mg via INTRAVENOUS
  Administered 2019-06-08: 30 mg via INTRAVENOUS

## 2019-06-08 MED ORDER — SODIUM CHLORIDE 0.9 % IV SOLN
250.0000 mL | INTRAVENOUS | Status: DC
  Administered 2019-06-09: 21:00:00 via INTRAVENOUS

## 2019-06-08 MED ORDER — ORAL CARE MOUTH RINSE
15.0000 mL | Freq: Two times a day (BID) | OROMUCOSAL | Status: DC
  Administered 2019-06-08 – 2019-06-13 (×9): 15 mL via OROMUCOSAL

## 2019-06-08 MED ORDER — INSULIN REGULAR BOLUS VIA INFUSION
0.0000 [IU] | Freq: Three times a day (TID) | INTRAVENOUS | Status: DC
  Administered 2019-06-09: 4 [IU] via INTRAVENOUS
  Filled 2019-06-08: qty 10

## 2019-06-08 MED ORDER — PANTOPRAZOLE SODIUM 40 MG PO TBEC
40.0000 mg | DELAYED_RELEASE_TABLET | Freq: Every day | ORAL | Status: DC
  Administered 2019-06-10 – 2019-06-14 (×5): 40 mg via ORAL
  Filled 2019-06-08 (×5): qty 1

## 2019-06-08 MED ORDER — SODIUM CHLORIDE 0.45 % IV SOLN: INTRAVENOUS | Status: DC | PRN

## 2019-06-08 MED ORDER — SODIUM CHLORIDE 0.9 % IV SOLN
INTRAVENOUS | Status: DC | PRN
  Administered 2019-06-08: 09:00:00 .8 [IU]/h via INTRAVENOUS

## 2019-06-08 MED FILL — Glutaral Soln 25%: TOPICAL | Qty: 50 | Status: AC

## 2019-06-08 SURGICAL SUPPLY — 119 items
ADAPTER CARDIO PERF ANTE/RETRO (ADAPTER) ×3 IMPLANT
ADPR PRFSN 84XANTGRD RTRGD (ADAPTER) ×1
BAG DECANTER FOR FLEXI CONT (MISCELLANEOUS) ×3 IMPLANT
BLADE CLIPPER SURG (BLADE) IMPLANT
BLADE SURG 11 STRL SS (BLADE) ×3 IMPLANT
CANISTER SUCT 3000ML PPV (MISCELLANEOUS) ×6 IMPLANT
CANNULA FEM VENOUS REMOTE 22FR (CANNULA) ×3 IMPLANT
CANNULA FEMORAL ART 14 SM (MISCELLANEOUS) ×3 IMPLANT
CANNULA GUNDRY RCSP 15FR (MISCELLANEOUS) ×3 IMPLANT
CANNULA OPTISITE PERFUSION 16F (CANNULA) IMPLANT
CANNULA OPTISITE PERFUSION 18F (CANNULA) ×3 IMPLANT
CANNULA SUMP PERICARDIAL (CANNULA) ×6 IMPLANT
CATH CPB KIT OWEN (MISCELLANEOUS) IMPLANT
CATH KIT ON-Q SILVERSOAK 5IN (CATHETERS) IMPLANT
CELLS DAT CNTRL 66122 CELL SVR (MISCELLANEOUS) ×2 IMPLANT
CONN ST 1/4X3/8  BEN (MISCELLANEOUS) ×2
CONN ST 1/4X3/8 BEN (MISCELLANEOUS) ×4 IMPLANT
CONNECTOR 1/2X3/8X1/2 3 WAY (MISCELLANEOUS) ×1
CONNECTOR 1/2X3/8X1/2 3WAY (MISCELLANEOUS) ×2 IMPLANT
CONT SPEC 4OZ CLIKSEAL STRL BL (MISCELLANEOUS) ×3 IMPLANT
COVER BACK TABLE 24X17X13 BIG (DRAPES) ×3 IMPLANT
COVER PROBE W GEL 5X96 (DRAPES) ×3 IMPLANT
COVER WAND RF STERILE (DRAPES) IMPLANT
DERMABOND ADVANCED (GAUZE/BANDAGES/DRESSINGS) ×2
DERMABOND ADVANCED .7 DNX12 (GAUZE/BANDAGES/DRESSINGS) ×4 IMPLANT
DEVICE CLOSURE PERCLS PRGLD 6F (VASCULAR PRODUCTS) ×8 IMPLANT
DEVICE TROCAR PUNCTURE CLOSURE (ENDOMECHANICALS) ×3 IMPLANT
DRAIN CHANNEL 28F RND 3/8 FF (WOUND CARE) ×6 IMPLANT
DRAPE C-ARM 42X72 X-RAY (DRAPES) ×3 IMPLANT
DRAPE CV SPLIT W-CLR ANES SCRN (DRAPES) ×3 IMPLANT
DRAPE HALF SHEET 40X57 (DRAPES) ×3 IMPLANT
DRAPE INCISE IOBAN 66X45 STRL (DRAPES) ×9 IMPLANT
DRAPE PERI GROIN 82X75IN TIB (DRAPES) ×3 IMPLANT
DRAPE SLUSH/WARMER DISC (DRAPES) ×3 IMPLANT
DRSG AQUACEL AG ADV 3.5X10 (GAUZE/BANDAGES/DRESSINGS) ×3 IMPLANT
DRSG COVADERM 4X8 (GAUZE/BANDAGES/DRESSINGS) ×3 IMPLANT
ELECT BLADE 6.5 EXT (BLADE) ×3 IMPLANT
ELECT REM PT RETURN 9FT ADLT (ELECTROSURGICAL) ×6
ELECTRODE REM PT RTRN 9FT ADLT (ELECTROSURGICAL) ×4 IMPLANT
FEMORAL VENOUS CANN RAP (CANNULA) IMPLANT
GAUZE SPONGE 4X4 12PLY STRL (GAUZE/BANDAGES/DRESSINGS) ×3 IMPLANT
GLOVE BIO SURGEON STRL SZ 6 (GLOVE) ×9 IMPLANT
GLOVE BIO SURGEON STRL SZ 6.5 (GLOVE) ×18 IMPLANT
GLOVE ORTHO TXT STRL SZ7.5 (GLOVE) ×9 IMPLANT
GOWN STRL REUS W/ TWL LRG LVL3 (GOWN DISPOSABLE) ×12 IMPLANT
GOWN STRL REUS W/TWL LRG LVL3 (GOWN DISPOSABLE) ×12
GRASPER SUT TROCAR 14GX15 (MISCELLANEOUS) ×3 IMPLANT
KIT BASIN OR (CUSTOM PROCEDURE TRAY) ×3 IMPLANT
KIT DILATOR VASC 18G NDL (KITS) ×3 IMPLANT
KIT DRAINAGE VACCUM ASSIST (KITS) ×3 IMPLANT
KIT SUCTION CATH 14FR (SUCTIONS) ×3 IMPLANT
KIT TURNOVER KIT B (KITS) ×3 IMPLANT
LEAD PACING MYOCARDI (MISCELLANEOUS) ×3 IMPLANT
LINE VENT (MISCELLANEOUS) ×3 IMPLANT
NEEDLE AORTIC ROOT 14G 7F (CATHETERS) ×3 IMPLANT
NS IRRIG 1000ML POUR BTL (IV SOLUTION) ×15 IMPLANT
PACK E MIN INVASIVE VALVE (SUTURE) ×3 IMPLANT
PACK OPEN HEART (CUSTOM PROCEDURE TRAY) ×3 IMPLANT
PAD ARMBOARD 7.5X6 YLW CONV (MISCELLANEOUS) ×6 IMPLANT
PAD ELECT DEFIB RADIOL ZOLL (MISCELLANEOUS) ×3 IMPLANT
PERCLOSE PROGLIDE 6F (VASCULAR PRODUCTS) ×12
POSITIONER HEAD DONUT 9IN (MISCELLANEOUS) ×3 IMPLANT
RTRCTR WOUND ALEXIS 18CM MED (MISCELLANEOUS) ×3
SET CANNULATION TOURNIQUET (MISCELLANEOUS) ×3 IMPLANT
SET CARDIOPLEGIA MPS 5001102 (MISCELLANEOUS) ×3 IMPLANT
SET IRRIG TUBING LAPAROSCOPIC (IRRIGATION / IRRIGATOR) ×3 IMPLANT
SET MICROPUNCTURE 5F STIFF (MISCELLANEOUS) ×3 IMPLANT
SHEATH BRITE TIP 8FR 35CM (SHEATH) ×9 IMPLANT
SOL ANTI FOG 6CC (MISCELLANEOUS) ×2 IMPLANT
SOLUTION ANTI FOG 6CC (MISCELLANEOUS) ×1
SUT BONE WAX W31G (SUTURE) ×3 IMPLANT
SUT ETHIBOND (SUTURE) IMPLANT
SUT ETHIBOND 2 0 SH (SUTURE) IMPLANT
SUT ETHIBOND 2 0 V4 (SUTURE) IMPLANT
SUT ETHIBOND 2 0V4 GREEN (SUTURE) IMPLANT
SUT ETHIBOND 2-0 RB-1 WHT (SUTURE) IMPLANT
SUT ETHIBOND 4 0 TF (SUTURE) IMPLANT
SUT ETHIBOND 5 0 C 1 30 (SUTURE) IMPLANT
SUT ETHIBOND NAB MH 2-0 36IN (SUTURE) IMPLANT
SUT ETHIBOND X763 2 0 SH 1 (SUTURE) ×3 IMPLANT
SUT GORETEX 6.0 TH-9 30 IN (SUTURE) IMPLANT
SUT GORETEX CV 4 TH 22 36 (SUTURE) ×3 IMPLANT
SUT GORETEX CV-5THC-13 36IN (SUTURE) IMPLANT
SUT GORETEX CV4 TH-18 (SUTURE) ×6 IMPLANT
SUT GORETEX TH-18 36 INCH (SUTURE) IMPLANT
SUT MNCRL AB 3-0 PS2 18 (SUTURE) IMPLANT
SUT PROLENE 3 0 SH DA (SUTURE) IMPLANT
SUT PROLENE 3 0 SH1 36 (SUTURE) ×36 IMPLANT
SUT PROLENE 4 0 RB 1 (SUTURE)
SUT PROLENE 4-0 RB1 .5 CRCL 36 (SUTURE) IMPLANT
SUT PROLENE 5 0 C 1 36 (SUTURE) IMPLANT
SUT PROLENE 6 0 C 1 30 (SUTURE) IMPLANT
SUT SILK  1 MH (SUTURE)
SUT SILK 1 MH (SUTURE) IMPLANT
SUT SILK 1 TIES 10X30 (SUTURE) IMPLANT
SUT SILK 2 0 SH CR/8 (SUTURE) IMPLANT
SUT SILK 2 0 TIES 10X30 (SUTURE) IMPLANT
SUT SILK 2 0SH CR/8 30 (SUTURE) IMPLANT
SUT SILK 3 0 (SUTURE)
SUT SILK 3 0 SH CR/8 (SUTURE) IMPLANT
SUT SILK 3 0SH CR/8 30 (SUTURE) IMPLANT
SUT SILK 3-0 18XBRD TIE 12 (SUTURE) IMPLANT
SUT TEM PAC WIRE 2 0 SH (SUTURE) IMPLANT
SUT VIC AB 2-0 CTX 36 (SUTURE) IMPLANT
SUT VIC AB 2-0 UR6 27 (SUTURE) IMPLANT
SUT VIC AB 3-0 SH 8-18 (SUTURE) IMPLANT
SUT VICRYL 2 TP 1 (SUTURE) IMPLANT
SYR 10ML LL (SYRINGE) ×3 IMPLANT
SYSTEM SAHARA CHEST DRAIN ATS (WOUND CARE) ×3 IMPLANT
TAPE CLOTH SURG 4X10 WHT LF (GAUZE/BANDAGES/DRESSINGS) ×3 IMPLANT
TAPE PAPER 2X10 WHT MICROPORE (GAUZE/BANDAGES/DRESSINGS) ×3 IMPLANT
TOWEL GREEN STERILE (TOWEL DISPOSABLE) ×3 IMPLANT
TOWEL GREEN STERILE FF (TOWEL DISPOSABLE) ×3 IMPLANT
TROCAR XCEL BLADELESS 5X75MML (TROCAR) ×3 IMPLANT
TROCAR XCEL NON-BLD 11X100MML (ENDOMECHANICALS) ×6 IMPLANT
TUBE SUCT INTRACARD DLP 20F (MISCELLANEOUS) ×3 IMPLANT
TUNNELER SHEATH ON-Q 11GX8 DSP (PAIN MANAGEMENT) IMPLANT
UNDERPAD 30X30 (UNDERPADS AND DIAPERS) ×3 IMPLANT
WATER STERILE IRR 1000ML POUR (IV SOLUTION) ×6 IMPLANT

## 2019-06-08 NOTE — Plan of Care (Signed)

## 2019-06-08 NOTE — Anesthesia Procedure Notes (Addendum)
Arterial Line Insertion Start/End8/25/2020 6:40 AM, 06/08/2019 6:42 AM Performed by: Orlie Dakin, CRNA, CRNA  Patient location: Pre-op. Preanesthetic checklist: patient identified, IV checked, site marked, risks and benefits discussed, surgical consent, monitors and equipment checked, pre-op evaluation, timeout performed and anesthesia consent Lidocaine 1% used for infiltration and patient sedated Left, radial was placed Catheter size: 20 G Hand hygiene performed  and maximum sterile barriers used   Attempts: 1 Procedure performed without using ultrasound guided technique. Following insertion, dressing applied and Biopatch. Post procedure assessment: normal and unchanged  Patient tolerated the procedure well with no immediate complications. Additional procedure comments: Performed by Griffin Dakin with Rolley Sims, CRNA supervision.Marland Kitchen

## 2019-06-08 NOTE — Progress Notes (Signed)
EVENING ROUNDS NOTE :     [address]       [place] [address]             [phone]                 Day of Surgery Procedure(s) (LRB): MINIMALLY INVASIVE RESECTION OF LEFT ATRIAL MYXOMA (N/A) TRANSESOPHAGEAL ECHOCARDIOGRAM (TEE) (N/A)   Total Length of Stay:  LOS: 0 days  Events:  On bipap for acidemia Waking up  Minimal CT output     BP 108/62   Pulse 71   Temp 99.3 F (37.4 C)   Resp 15   LMP 09/01/2013   SpO2 100%   CVP:  [6 mmHg] 6 mmHg  FiO2 (%):  [40 %] 40 %  . sodium chloride 10 mL/hr at 06/08/19 1500  . sodium chloride 100 mL/hr at 06/08/19 1330  . [START ON 06/09/2019] sodium chloride    . sodium chloride    . albumin human    . cefUROXime (ZINACEF)  IV    . dexmedetomidine (PRECEDEX) IV infusion    . famotidine (PEPCID) IV 20 mg (06/08/19 1330)  . insulin 1.3 mL/hr at 06/08/19 1500  . lactated ringers    . lactated ringers    . lactated ringers    . magnesium sulfate 20 mL/hr at 06/08/19 1500  . nitroGLYCERIN 0 mcg/min (06/08/19 1330)  . phenylephrine (NEO-SYNEPHRINE) Adult infusion 0 mcg/min (06/08/19 1330)  . potassium chloride    . vancomycin      No intake/output data recorded.   CBC Latest Ref Rng & Units 06/08/2019 06/08/2019 06/08/2019  WBC 4.0 - 10.5 K/uL - - 18.4(H)  Hemoglobin 12.0 - 15.0 g/dL 10.9(L) 11.2(L) 9.5(L)  Hematocrit 36.0 - 46.0 % 32.0(L) 33.0(L) 31.8(L)  Platelets 150 - 400 K/uL - - 284    BMP Latest Ref Rng & Units 06/08/2019 06/08/2019 06/08/2019  Glucose 70 - 99 mg/dL - - 129(H)  BUN 6 - 20 mg/dL - - -  Creatinine 0.44 - 1.00 mg/dL - - -  BUN/Creat Ratio 9 - 23 - - -  Sodium 135 - 145 mmol/L 144 143 142  Potassium 3.5 - 5.1 mmol/L 3.1(L) 3.5 4.0  Chloride 98 - 111 mmol/L - - -  CO2 22 - 32 mmol/L - - -  Calcium 8.9 - 10.3 mg/dL - - -    ABG    Component Value Date/Time   PHART 7.271 (L) 06/08/2019 1515   PCO2ART 55.3 (H) 06/08/2019 1515   PO2ART 112.0 (H) 06/08/2019 1515   HCO3 25.4  06/08/2019 1515   TCO2 27 06/08/2019 1515   ACIDBASEDEF 2.0 06/08/2019 1515   O2SAT 97.0 06/08/2019 [place], MD 06/08/2019 4:54 PM

## 2019-06-08 NOTE — Progress Notes (Signed)
Post extubation ABG was taken and results called in to Dr. Roxy Manns. PH 7.24, pCO2 68 pO2 98 HCO3 28.9. MD asked nurse to put patient on BIPAP for some hours and repeat ABG in an hour. Orders will be carried out. Will continue to monitor patient. RT notified of BIPAP order.

## 2019-06-08 NOTE — Progress Notes (Signed)
  Echocardiogram Echocardiogram Transesophageal has been performed.  Bobbye Charleston 06/08/2019, 10:16 AM

## 2019-06-08 NOTE — Interval H&P Note (Signed)
History and Physical Interval Note:  06/08/2019 5:54 AM  Michele Wood  has presented today for surgery, with the diagnosis of LEFT ATRIAL MYXOMA.  The various methods of treatment have been discussed with the patient and family. After consideration of risks, benefits and other options for treatment, the patient has consented to  Procedure(s): MINIMALLY INVASIVE RESECTION OF LEFT ATRIAL MYXOMA (N/A) TRANSESOPHAGEAL ECHOCARDIOGRAM (TEE) (N/A) as a surgical intervention.  The patient's history has been reviewed, patient examined, no change in status, stable for surgery.  I have reviewed the patient's chart and labs.  Questions were answered to the patient's satisfaction.     [name]

## 2019-06-08 NOTE — Progress Notes (Signed)
BIPAP taken off patient at 1700 and put on 4L [place]. Patient's O2 sat is 100% at this time and more awake. Patient is being coached to use Incentive spirometer every 15 minutes. Will continue to monitor.

## 2019-06-08 NOTE — Transfer of Care (Signed)
Immediate Anesthesia Transfer of Care Note  Patient: Michele Wood  Procedure(s) Performed: MINIMALLY INVASIVE RESECTION OF LEFT ATRIAL MYXOMA (N/A Chest) TRANSESOPHAGEAL ECHOCARDIOGRAM (TEE) (N/A )  Patient Location: ICU  Anesthesia Type:General  Level of Consciousness: awake and patient cooperative  Airway & Oxygen Therapy: Patient Spontanous Breathing and Patient connected to face mask oxygen  Post-op Assessment: Report given to RN and Post -op Vital signs reviewed and stable  Post vital signs: Reviewed and stable  Last Vitals:  Vitals Value Taken Time  BP    Temp    Pulse 78 06/08/19 1339  Resp 15 06/08/19 1339  SpO2 95 % 06/08/19 1339  Vitals shown include unvalidated device data.  Last Pain:  Vitals:   06/08/19 0606  PainSc: 5       Patients Stated Pain Goal: 3 ([id number])  Complications: No apparent anesthesia complications

## 2019-06-08 NOTE — Brief Op Note (Signed)
06/08/2019  12:51 PM  PATIENT:  Michele Wood  58 y.o. female  PRE-OPERATIVE DIAGNOSIS:  LEFT ATRIAL MYXOMA  POST-OPERATIVE DIAGNOSIS:  LEFT ATRIAL MYXOMA  PROCEDURE:  Procedure(s):  MINIMALLY INVASIVE RESECTION OF LEFT ATRIAL MYXOMA (N/A) TRANSESOPHAGEAL ECHOCARDIOGRAM (TEE) (N/A)  SURGEON:  Surgeon(s) and Role:    [name], MD - Primary  PHYSICIAN ASSISTANT: Ellwood Handler PA-C  ANESTHESIA:   general  EBL:  500 mL   BLOOD ADMINISTERED:CELLSAVER  DRAINS: Right Pleural Chest Tubes   LOCAL MEDICATIONS USED:  NONE  SPECIMEN:  Source of Specimen:  Atrial Myxoma  DISPOSITION OF SPECIMEN:  PATHOLOGY  COUNTS:  YES  TOURNIQUET:  * No tourniquets in log *  DICTATION: .Dragon Dictation  PLAN OF CARE: Admit to inpatient   PATIENT DISPOSITION:  ICU - intubated and hemodynamically stable.   Delay start of Pharmacological VTE agent (>24hrs) due to surgical blood loss or risk of bleeding: yes

## 2019-06-08 NOTE — Anesthesia Procedure Notes (Signed)
Procedure Name: Intubation Date/Time: 06/08/2019 8:20 AM Performed by: Orlie Dakin, CRNA Pre-anesthesia Checklist: Patient identified, Emergency Drugs available, Suction available and Patient being monitored Patient Re-evaluated:Patient Re-evaluated prior to induction Oxygen Delivery Method: Circle system utilized Preoxygenation: Pre-oxygenation with 100% oxygen Induction Type: IV induction Ventilation: Mask ventilation without difficulty and Oral airway inserted - appropriate to patient size Laryngoscope Size: Glidescope and 4 Grade View: Grade I Tube type: Oral Endobronchial tube: Left, Double lumen EBT and EBT position confirmed by auscultation and 37 Fr Number of attempts: 1 Airway Equipment and Method: Stylet and Video-laryngoscopy Placement Confirmation: ETT inserted through vocal cords under direct vision,  positive ETCO2 and breath sounds checked- equal and bilateral Secured at: 28 cm Tube secured with: Tape Dental Injury: Teeth and Oropharynx as per pre-operative assessment  Comments: DL by SRNA A [place] with findings of grade III view, transitioned to VL with grade I view. DLT introduced into glottis with mild difficulty, twisted to advance DLT through glottic opening. VIVA SIGHT placed in position by Dr Glennon Mac.

## 2019-06-08 NOTE — Progress Notes (Signed)
RT NOTE: RT placed patient on Bipap V60 per MD order with RN at bedside. Patient tolerating well and says she is comfortable. RT will continue to monitor.

## 2019-06-08 NOTE — Op Note (Signed)
CARDIOTHORACIC SURGERY OPERATIVE NOTE  Date of Procedure:  06/08/2019  Preoperative Diagnosis: Left Atrial Myxoma  Postoperative Diagnosis: Same  Procedure:    Minimally-Invasive Resection of Left Atrial Myxoma  Right anterior mini thoracotomy approach  Autologous pericardial patch closure of interatrial septum    Surgeon: Valentina Gu. Roxy Manns, MD  Assistant: Ellwood Handler, PA-C  Anesthesia: Midge Minium, MD  Operative Findings:  Moderate-sized left atrial mass  Broad stalk arising from left atrial surface of fossa ovalis              BRIEF CLINICAL NOTE AND INDICATIONS FOR SURGERY  Patient is a 57 year old morbidly obese female with history of hypertension, obstructive sleep apnea, degenerative disc disease of the cervical and lumbar spine status post recent spine surgery, and type 2 diabetes mellitus without complications who has been referred for surgical consultation to discuss treatment options for management of recently discovered left atrial mass 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  Patient has long history of stable symptoms of exertional shortness of breath but more recently has experienced symptoms of exertional chest tightness.  She also has had problems with chronic back pain and weakness in her legs for which she was evaluated by Dr. Arnoldo Morale from neurosurgery.  She was referred for preoperative cardiac clearance and evaluated by Dr. Gwenlyn Found in March of this year.  Prior to that she had been seen in the emergency department in early February for accelerated symptoms of chest pain.  She ruled out for myocardial infarction at that time.  She was noted to have left bundle branch block on baseline EKG.  She underwent transthoracic echocardiogram December 28, 2018 which revealed mildly reduced left ventricular systolic function with ejection fraction estimated 45 to 50%.  Left ventricular chamber size was notably small.  There were signs suggestive of significant  diastolic dysfunction.  No valvular abnormalities were noted.  Cardiac gated CT angiogram of the heart was performed February 11, 2019.  This revealed coronary calcium score of 26 with minimal nonobstructive coronary artery disease.  The patient was also noted to have a relatively large, homogeneous, well-circumscribed mass in the left adherent to the fossa ovalis adherent to the intra-atrial septum with findings consistent with likely left atrial myxoma.  The patient was cleared for surgery and subsequently underwent spine surgery by Dr. Arnoldo Morale including bilateral L3-4 and L4-5 nerve root decompression and fusion.  She has recovered from this procedure without complication and subsequently underwent transesophageal echocardiogram 05/17/2019 confirming the presence of a 2.3 x 1 7 cm mass in the left atrium adherent to the fossa ovalis, consistent with atrial myxoma.  Cardiothoracic surgical consultation was requested.  The patient has been seen in consultation and counseled at length regarding the indications, risks and potential benefits of surgery.  All questions have been answered, and the patient provides full informed consent for the operation as described.    DETAILS OF THE OPERATIVE PROCEDURE  Preparation:  The patient is brought to the operating room on the above mentioned date and central monitoring was established by the anesthesia team including placement of central venous catheter through the left internal jugular vein.  A radial arterial line is placed. The patient is placed in the supine position on the operating table.  Intravenous antibiotics are administered. General endotracheal anesthesia is induced uneventfully. The patient is initially intubated using a dual lumen endotracheal tube.  A Foley catheter is placed.  Baseline transesophageal echocardiogram was performed.  Findings were notable for a moderate sized benign-appearing mass in  the left atrium attached to the intra-atrial septum.   There was normal left ventricular size and systolic function.  There was trace central mitral regurgitation.  The aortic valve is normal.  Right ventricular size and function was normal.  No other abnormalities were noted.  A soft roll is placed behind the patient's left scapula and the neck gently extended and turned to the left.   The patient's right neck, chest, abdomen, both groins, and both lower extremities are prepared and draped in a sterile manner. A time out procedure is performed.   Percutaneous Vascular Access:  Percutaneous arterial and venous access were obtained on the right side.  Using ultrasound guidance the right common femoral vein was cannulated using the Seldinger technique a pair of Perclose vascular closure devises were placed at opposing 30 degree angles in the femoral vein, after which time an 8 French sheath inserted.  The right common femoral artery was cannulated using a micropuncture wire and sheath.  A pair of Perclose vascular closure devices were placed at opposing 30 degree angles in the femoral artery, and a 8 French sheath inserted.  The right internal jugular vein was cannulated  using ultrasound guidance and an 8 French sheath inserted.     Surgical Approach:  A right miniature anterolateral thoracotomy incision is performed. The incision is placed just lateral to and superior to the right nipple. The pectoralis major muscle is retracted medially and completely preserved. The right pleural space is entered through the 3rd intercostal space. A soft tissue retractor is placed.  Two 11 mm ports are placed through separate stab incisions inferiorly. The right pleural space is insufflated continuously with carbon dioxide gas through the posterior port during the remainder of the operation.  A pledgeted sutures placed through the dome of the right hemidiaphragm and retracted inferiorly to facilitate exposure.  A longitudinal incision is made in the pericardium 3 cm  anterior to the phrenic nerve and silk traction sutures are placed on either side of the incision for exposure.   Extracorporeal Cardiopulmonary Bypass and Myocardial Protection:   The patient was heparinized systemically.  The right common femoral vein is cannulated through the venous sheath and a guidewire advanced into the right atrium using TEE guidance.  The femoral vein cannulated using a 22 Fr long femoral venous cannula.  The right common femoral artery is cannulated through the arterial sheath and a guidewire advanced into the descending thoracic aorta using TEE guidance.  Femoral artery is cannulated with a 18 French femoral arterial cannula.  The right internal jugular vein is cannulated through the venous sheath and a guidewire advanced into the right atrium.  The internal jugular vein is cannulated using a 14 French pediatric femoral venous cannula.   Adequate heparinization is verified.   The entire pre-bypass portion of the operation was notable for stable hemodynamics.  Cardiopulmonary bypass was begun.  Vacuum assist venous drainage is utilized. The incision in the pericardium is extended in both directions. Venous drainage and exposure are notably excellent.  A portion of the patient's pericardium is excised and subsequently tanned in 0.625% glutaraldehyde solution for 3 minutes after which time it was rinsed and consecutive baths of saline.  Umbilical tapes were placed around the superior vena cava and inferior vena cava.  An antegrade cardioplegia cannula is placed in the ascending aorta.    The patient is cooled to 32C systemic temperature.  The aortic cross clamp is applied and cardioplegia is delivered initially in an antegrade fashion  through the aortic root using modified [place] cold blood cardioplegia ([facility] blood cardioplegia protocol).   The initial cardioplegic arrest is rapid with early diastolic arrest.  Myocardial protection was felt to be excellent.   Resection  of Left Atrial Myxoma:  A small incision is made into the left atrium through the intra-atrial groove posteriorly.  Through this incision the left atrial mass was easily visualized.  A sump drain is placed through the incision into the pulmonary vein.  The femoral venous cannula is pulled down until the tip is outside of the right atrium.  Both the superior vena cava and the inferior vena cava umbilical tapes are snared.  An oblique incision is made in the right atrium.  The fossa ovalis is inspected and notably appears normal from the right atrial surface.  The incision in the right atrium is extended across the back wall to reach the small left atriotomy incision.  Once this is been completed the entire left atrial mass is easily visualized where it is notably attached directly to the fossa ovalis.  The entire fossa ovalis is excised with the mass intact taking care to make sure that the entire base of the mass is resected with its attachment to the fossa ovalis.  The mass is removed and sent to pathology for routine histology.  The patient's autologous pericardial patch is trimmed to an appropriate size and elliptical shape.  The atrial septum is repaired using a simple patch closure with running 3-0 Prolene suture.  The sump drain in the left atrium is moved and replaced across the mitral valve to serve as a left ventricular vent.  The left atriotomy incision is closed posteriorly.   Procedure Completion:  One final dose of warm retrograde "reanimation dose" cardioplegia was administered through the aortic root.  The aortic cross clamp was removed after a total cross clamp time of 56 minutes.  The right atriotomy incision is closed using a 2 layer closure of running 3-0 Prolene suture.  Epicardial pacing wires are fixed to the inferior wall of the right ventricule and to the right atrial appendage. The patient is rewarmed to 37C temperature. The left ventricular vent and antegrade cardioplegia  cannula are removed. The patient is weaned and disconnected from cardiopulmonary bypass.  The patient's rhythm at separation from bypass was AV paced.  The patient was weaned from bypass without any inotropic support. Total cardiopulmonary bypass time for the operation was 107 minutes.  Followup transesophageal echocardiogram performed after separation from bypass revealed normal left ventricular size and systolic function.  The intra-atrial septum appeared intact with no sign of any communication between the left and right atrium.  The femoral arterial and venous cannulas were removed and all Perclose sutures secured.  Manual pressure was maintained while Protamine was administered.  The right internal jugular cannula was removed and manual pressure held on the neck and groin for 15 minutes.  Single lung ventilation was begun. The atriotomy closure was inspected for hemostasis. The pericardial sac was drained using a 28 French Bard drain placed through the anterior port incision.  The right pleural space is irrigated with saline solution and inspected for hemostasis.   The right pleural space was drained using a 28 French Bard drain placed through the posterior port incision. The miniature thoracotomy incision was closed in multiple layers in routine fashion. The right groin incision was inspected for hemostasis and closed in multiple layers in routine fashion.  The post-bypass portion of the operation was notable  for stable rhythm and hemodynamics.  No blood products were administered during the operation.   Disposition:  The patient tolerated the procedure well.  The patient was extubated in the operating room and subsequently transported to the surgical intensive care unit in stable condition. There were no intraoperative complications. All sponge instrument and needle counts are verified correct at completion of the operation.     Valentina Gu. Roxy Manns MD 06/08/2019 12:55 PM

## 2019-06-08 NOTE — Progress Notes (Signed)
Repeat ABG called in to Dr. Roxy Manns. PH 7.27 pCO2 55 pO2 112 HCO3 25.4. MD stated to continue BIPAP and monitor patient. If patient becomes more somnolence and in respiratory distress to repeat another ABG. Patient is tolerating BIPAP well at the moment and more awake.

## 2019-06-09 ENCOUNTER — Inpatient Hospital Stay (HOSPITAL_COMMUNITY): Payer: Medicare Other

## 2019-06-09 ENCOUNTER — Encounter (HOSPITAL_COMMUNITY): Payer: Self-pay | Admitting: Thoracic Surgery (Cardiothoracic Vascular Surgery)

## 2019-06-09 LAB — GLUCOSE, CAPILLARY
Glucose-Capillary: 103 mg/dL — ABNORMAL HIGH (ref 70–99)
Glucose-Capillary: 106 mg/dL — ABNORMAL HIGH (ref 70–99)
Glucose-Capillary: 109 mg/dL — ABNORMAL HIGH (ref 70–99)
Glucose-Capillary: 110 mg/dL — ABNORMAL HIGH (ref 70–99)
Glucose-Capillary: 110 mg/dL — ABNORMAL HIGH (ref 70–99)
Glucose-Capillary: 112 mg/dL — ABNORMAL HIGH (ref 70–99)
Glucose-Capillary: 114 mg/dL — ABNORMAL HIGH (ref 70–99)
Glucose-Capillary: 115 mg/dL — ABNORMAL HIGH (ref 70–99)
Glucose-Capillary: 115 mg/dL — ABNORMAL HIGH (ref 70–99)
Glucose-Capillary: 122 mg/dL — ABNORMAL HIGH (ref 70–99)
Glucose-Capillary: 126 mg/dL — ABNORMAL HIGH (ref 70–99)
Glucose-Capillary: 132 mg/dL — ABNORMAL HIGH (ref 70–99)
Glucose-Capillary: 145 mg/dL — ABNORMAL HIGH (ref 70–99)
Glucose-Capillary: 155 mg/dL — ABNORMAL HIGH (ref 70–99)
Glucose-Capillary: 155 mg/dL — ABNORMAL HIGH (ref 70–99)
Glucose-Capillary: 185 mg/dL — ABNORMAL HIGH (ref 70–99)
Glucose-Capillary: 77 mg/dL (ref 70–99)
Glucose-Capillary: 83 mg/dL (ref 70–99)
Glucose-Capillary: 99 mg/dL (ref 70–99)

## 2019-06-09 LAB — BASIC METABOLIC PANEL
Anion gap: 9 (ref 5–15)
BUN: 8 mg/dL (ref 6–20)
CO2: 25 mmol/L (ref 22–32)
Calcium: 7.7 mg/dL — ABNORMAL LOW (ref 8.9–10.3)
Chloride: 103 mmol/L (ref 98–111)
Creatinine, Ser: 0.63 mg/dL (ref 0.44–1.00)
GFR calc Af Amer: >60 mL/min (ref >60)
GFR calc non Af Amer: >60 mL/min (ref >60)
Glucose, Bld: 111 mg/dL — ABNORMAL HIGH (ref 70–99)
Potassium: 3.5 mmol/L (ref 3.5–5.1)
Sodium: 137 mmol/L (ref 135–145)

## 2019-06-09 LAB — MAGNESIUM
Magnesium: 2.2 mg/dL (ref 1.7–2.4)
Magnesium: 2 mg/dL (ref 1.7–2.4)

## 2019-06-09 LAB — CBC
HCT: 29.3 % — ABNORMAL LOW (ref 36.0–46.0)
HCT: 29.2 % — ABNORMAL LOW (ref 36.0–46.0)
Hemoglobin: 9.1 g/dL — ABNORMAL LOW (ref 12.0–15.0)
Hemoglobin: 9.1 g/dL — ABNORMAL LOW (ref 12.0–15.0)
MCH: 25 pg — ABNORMAL LOW (ref 26.0–34.0)
MCH: 24.5 pg — ABNORMAL LOW (ref 26.0–34.0)
MCHC: 31.1 g/dL (ref 30.0–36.0)
MCHC: 31.2 g/dL (ref 30.0–36.0)
MCV: 80.5 fL (ref 80.0–100.0)
MCV: 78.7 fL — ABNORMAL LOW (ref 80.0–100.0)
Platelets: 277 10*3/uL (ref 150–400)
Platelets: 292 10*3/uL (ref 150–400)
RBC: 3.64 MIL/uL — ABNORMAL LOW (ref 3.87–5.11)
RBC: 3.71 MIL/uL — ABNORMAL LOW (ref 3.87–5.11)
RDW: 13.7 % (ref 11.5–15.5)
RDW: 13.7 % (ref 11.5–15.5)
WBC: 10.8 10*3/uL — ABNORMAL HIGH (ref 4.0–10.5)
WBC: 13.3 10*3/uL — ABNORMAL HIGH (ref 4.0–10.5)
nRBC: 0 % (ref 0.0–0.2)
nRBC: 0 % (ref 0.0–0.2)

## 2019-06-09 MED ORDER — ASPIRIN EC 81 MG PO TBEC
81.0000 mg | DELAYED_RELEASE_TABLET | Freq: Every day | ORAL | Status: DC
  Administered 2019-06-10 – 2019-06-14 (×5): 81 mg via ORAL
  Filled 2019-06-09 (×6): qty 1

## 2019-06-09 MED ORDER — METOPROLOL TARTRATE 12.5 MG HALF TABLET
12.5000 mg | ORAL_TABLET | Freq: Two times a day (BID) | ORAL | Status: DC
  Administered 2019-06-09 – 2019-06-13 (×9): 12.5 mg via ORAL
  Filled 2019-06-09 (×9): qty 1

## 2019-06-09 MED ORDER — DULOXETINE HCL 60 MG PO CPEP
60.0000 mg | ORAL_CAPSULE | Freq: Every day | ORAL | Status: DC
  Administered 2019-06-09 – 2019-06-14 (×6): 60 mg via ORAL
  Filled 2019-06-09 (×6): qty 1

## 2019-06-09 MED ORDER — INSULIN ASPART 100 UNIT/ML ~~LOC~~ SOLN
0.0000 [IU] | SUBCUTANEOUS | Status: DC
  Administered 2019-06-09 (×2): 2 [IU] via SUBCUTANEOUS

## 2019-06-09 MED ORDER — ENOXAPARIN SODIUM 30 MG/0.3ML ~~LOC~~ SOLN
30.0000 mg | Freq: Every day | SUBCUTANEOUS | Status: DC
  Administered 2019-06-10 – 2019-06-13 (×4): 30 mg via SUBCUTANEOUS
  Filled 2019-06-09 (×4): qty 0.3

## 2019-06-09 MED ORDER — MUPIROCIN 2 % EX OINT
1.0000 "application " | TOPICAL_OINTMENT | Freq: Two times a day (BID) | CUTANEOUS | Status: AC
  Administered 2019-06-09 – 2019-06-13 (×10): 1 via NASAL
  Filled 2019-06-09: qty 22

## 2019-06-09 MED ORDER — INSULIN DETEMIR 100 UNIT/ML ~~LOC~~ SOLN
20.0000 [IU] | Freq: Once | SUBCUTANEOUS | Status: AC
  Administered 2019-06-09: 12:00:00 20 [IU] via SUBCUTANEOUS
  Filled 2019-06-09: qty 0.2

## 2019-06-09 MED ORDER — MUPIROCIN 2 % EX OINT
TOPICAL_OINTMENT | CUTANEOUS | Status: AC
  Administered 2019-06-09: 1 via NASAL
  Filled 2019-06-09: qty 22

## 2019-06-09 MED ORDER — ATORVASTATIN CALCIUM 40 MG PO TABS
40.0000 mg | ORAL_TABLET | Freq: Every day | ORAL | Status: DC
  Administered 2019-06-10 – 2019-06-14 (×5): 40 mg via ORAL
  Filled 2019-06-09 (×5): qty 1

## 2019-06-09 MED ORDER — POTASSIUM CHLORIDE 10 MEQ/50ML IV SOLN
10.0000 meq | INTRAVENOUS | Status: AC
  Administered 2019-06-09 (×3): 10 meq via INTRAVENOUS
  Filled 2019-06-09 (×2): qty 50

## 2019-06-09 MED ORDER — ASPIRIN EC 325 MG PO TBEC
325.0000 mg | DELAYED_RELEASE_TABLET | Freq: Every day | ORAL | Status: AC
  Administered 2019-06-09: 325 mg via ORAL
  Filled 2019-06-09: qty 1

## 2019-06-09 NOTE — Discharge Instructions (Signed)

## 2019-06-09 NOTE — Progress Notes (Signed)
      [address]       [place] [address]             [phone]        CARDIOTHORACIC SURGERY PROGRESS NOTE   R1 Day Post-Op Procedure(s) (LRB): MINIMALLY INVASIVE RESECTION OF LEFT ATRIAL MYXOMA (N/A) TRANSESOPHAGEAL ECHOCARDIOGRAM (TEE) (N/A)  Subjective: Looks good.   Minimal pain.  No SOB  Objective: Vital signs: BP Readings from Last 1 Encounters:  06/09/19 122/81   Pulse Readings from Last 1 Encounters:  06/09/19 76   Resp Readings from Last 1 Encounters:  06/09/19 16   Temp Readings from Last 1 Encounters:  06/09/19 99 F (37.2 C)    Hemodynamics: CVP:  [6 mmHg] 6 mmHg  Physical Exam:  Rhythm:   sinus  Breath sounds: clear  Heart sounds:  RRR  Incisions:  Dressings clean and dry  Abdomen:  Soft, non-distended, non-tender  Extremities:  Warm, well-perfused  Chest tubes:  low volume thin serosanguinous output, no air leak    Intake/Output from previous day: 08/25 0701 - 08/26 0700 In: 4062.6 [I.V.:3263.1; Blood:212; IV Piggyback:587.5] Out: 2325 [id number]; Blood:500; Chest Tube:280] Intake/Output this shift: No intake/output data recorded.  Lab Results:  CBC: Recent Labs    06/08/19 1823 06/08/19 1849 06/09/19 0305  WBC 13.5*  --  10.8*  HGB 9.9* 10.2* 9.1*  HCT 31.7* 30.0* 29.3*  PLT 263  --  277    BMET:  Recent Labs    06/08/19 1823 06/08/19 1849 06/09/19 0305  NA 137 142 137  K 3.3* 3.3* 3.5  CL 105 105 103  CO2 23  --  25  GLUCOSE 132* 130* 111*  BUN 9 9 8   CREATININE 0.68 0.60 0.63  CALCIUM 7.6*  --  7.7*     PT/INR:   Recent Labs    06/08/19 1345  LABPROT 17.8*  INR 1.5*    CBG (last 3)  Recent Labs    06/09/19 0523 06/09/19 0615 06/09/19 0719  GLUCAP 114* 106* 110*    ABG    Component Value Date/Time   PHART 7.337 (L) 06/08/2019 1816   PCO2ART 39.1 06/08/2019 1816   PO2ART 127.0 (H) 06/08/2019 1816   HCO3 20.9 06/08/2019 1816   TCO2 24 06/08/2019 1849   ACIDBASEDEF 4.0 (H)  06/08/2019 1816   O2SAT 99.0 06/08/2019 1816    CXR: Looks good.  Mild atelectasis R>L  EKG: NSR w/out acute ischemic changes, LBBB (old)   Assessment/Plan: S/P Procedure(s) (LRB): MINIMALLY INVASIVE RESECTION OF LEFT ATRIAL MYXOMA (N/A) TRANSESOPHAGEAL ECHOCARDIOGRAM (TEE) (N/A)  Doing well POD1 Maintaining NSR w/ stable hemodynamics, no drips Breathing comfortably w/ O2 sats 100% on 4 L/min via [place] Expected post op acute blood loss anemia, mild Expected post op volume excess, weight reportedly 3 kg > preop, UOP adequate Expected post op atelectasis, mild, R>L Type II diabetes mellitus, excellent glycemic control   Mobilize  Diuresis  Start low dose beta blocker  Convert off insulin drip   [name], MD 06/09/2019 7:50 AM

## 2019-06-09 NOTE — Anesthesia Postprocedure Evaluation (Signed)
Anesthesia Post Note  Patient: Michele Wood  Procedure(s) Performed: MINIMALLY INVASIVE RESECTION OF LEFT ATRIAL MYXOMA (N/A Chest) TRANSESOPHAGEAL ECHOCARDIOGRAM (TEE) (N/A )     Patient location during evaluation: SICU Anesthesia Type: General Level of consciousness: awake and alert, patient cooperative and oriented Pain management: pain level controlled Vital Signs Assessment: post-procedure vital signs reviewed and stable Respiratory status: spontaneous breathing, nonlabored ventilation and respiratory function stable Cardiovascular status: blood pressure returned to baseline and stable Postop Assessment: no apparent nausea or vomiting and adequate PO intake Anesthetic complications: yes (patient extremely hoarse after difficult double-lumen ETT placement, as well as TEE) Comments: Patient denies sore throat, yet is very hoarse S/P double lumen ETT placement. Discussed with patient the expectation for resolution in a few days.    Last Vitals:  Vitals:   06/09/19 1700 06/09/19 1800  BP:    Pulse: 76 79  Resp: 20 (!) 23  Temp:    SpO2: 100% 100%    Last Pain:  Vitals:   06/09/19 1600  TempSrc:   PainSc: 0-No pain                 Rima Blizzard,E. Hinley Brimage

## 2019-06-09 NOTE — Progress Notes (Addendum)
Spoke with Jarrett Soho, RN regarding consult for no blood return to left subclavian introducer.  Due to the catheter not being central with tip location in innominate vein per CXR, we cannot TPA line.  Jarrett Soho to contact lab for lab draw.  Carolee Rota, RN VAST

## 2019-06-09 NOTE — Discharge Summary (Signed)
Physician Discharge Summary  Wood ID: Michele Wood MRN: [id number] DOB/AGE: Jul 24, 1961 58 y.o.  Admit date: 06/08/2019 Discharge date: 06/14/2019  Admission Diagnoses:  Wood Active Problem List   Diagnosis Date Noted  . Pancreatic cyst 05/31/2019  . Spondylolisthesis of lumbar region 03/31/2019  . Atrial myxoma 03/19/2019  . Hyperlipidemia 12/16/2018  . Family history of heart disease 12/16/2018  . Left bundle branch block 12/16/2018  . Chest pain 12/16/2018  . MDD (major depressive disorder), recurrent episode, moderate ([place]) 10/19/2018  . New onset type 2 diabetes mellitus ([place]) 12/12/2017  . Obesity 09/12/2017  . Anxiety and depression 06/13/2017  . Cervical radiculopathy due to intervertebral disc disorder 06/13/2017  . Chronic low back pain without sciatica 09/18/2015  . Degenerative cervical spinal stenosis 06/08/2015  . Vitamin D insufficiency 04/25/2014  . HTN (hypertension) 06/21/2008   Discharge Diagnoses:   Wood Active Problem List   Diagnosis Date Noted  . S/P minimally-invasive resection of left atrial myxoma 06/08/2019  . Pancreatic cyst 05/31/2019  . Spondylolisthesis of lumbar region 03/31/2019  . Atrial myxoma 03/19/2019  . Hyperlipidemia 12/16/2018  . Family history of heart disease 12/16/2018  . Left bundle branch block 12/16/2018  . Chest pain 12/16/2018  . MDD (major depressive disorder), recurrent episode, moderate ([place]) 10/19/2018  . New onset type 2 diabetes mellitus ([place]) 12/12/2017  . Obesity 09/12/2017  . Anxiety and depression 06/13/2017  . Cervical radiculopathy due to intervertebral disc disorder 06/13/2017  . Chronic low back pain without sciatica 09/18/2015  . Degenerative cervical spinal stenosis 06/08/2015  . Vitamin D insufficiency 04/25/2014  . HTN (hypertension) 06/21/2008   Discharged Condition: good  History of Present Illness:  Michele Wood is a a 58 yo morbidly obese AA female with known history of  hypertension, obstructive sleep apnea, degenerative disc disease of Michele cervical and lumbar spine status post recent spine surgery, and type 2 diabetes mellitus without complications.  Michele Wood has a long standing history of stable symptoms of exertional shortness of breath but more recently has experienced symptoms of exertional chest tightness. She also has had problems with chronic back pain and weakness in her legs for which she was evaluated by Dr. Arnoldo Morale from neurosurgery. She was referred for preoperative cardiac clearance and evaluated by Dr. Gwenlyn Found in March of this year. Prior to that she had been seen in [facility] emergency department in early February for accelerated symptoms of chest pain. She ruled out for myocardial infarction at that time. She was noted to have left bundle branch block on baseline EKG. She underwent transthoracic echocardiogram December 28, 2018 which revealed mildly reduced left ventricular systolic function with ejection fraction estimated 45 to 50%. Left ventricular chamber size was notably small. There were signs suggestive of significant diastolic dysfunction. No valvular abnormalities were noted. Cardiac gated CT angiogram of Michele heart was performed February 11, 2019. This revealed coronary calcium score of 26 with minimal nonobstructive coronary artery disease. Michele Wood was also noted to have a relatively large, homogeneous, well-circumscribed mass in Michele left adherent to Michele fossa ovalis adherent to Michele intra-atrial septum with findings consistent with likely left atrial myxoma. Michele Wood was cleared for surgery and subsequently underwent spine surgery by Dr. Arnoldo Morale including bilateral L3-4 and L4-5 nerve root decompression andfusion. She has recovered from this procedure without complication and subsequently underwent transesophageal echocardiogram 08/03/2020confirming Michele presence of a 2.3 x 1 7 cm mass in Michele left atrium adherent to Michele fossa ovalis, consistent  with atrial myxoma.  Cardiothoracic surgical consultation was requested.  She was evaluated by Dr. Roxy Manns at which time [facility]hele Wood admitted she  has been disabled for Michele past 2 years because of problems with arthritis and degenerative disc disease in her neck and spine. She previously worked in [employer]. She has 2 adult children and 3 grandchildren. She lives a sedentary lifestyle. Her mobility is limited by chronic pain, weakness, and some instability of gait. She ambulates using a cane. She has been obese all of her adult life. She describes stable symptoms of exertional shortness of breath that have not changed for Michele past several years. She has had some intermittent episodes of tightness across her chest that seem to be brought on with exertion and relieved by rest. Symptoms of chest tightness are not necessarily associated with exertion. She reports occasional slight dizzy spells and occasional palpitations. She denies any PND, orthopnea, or lower extremity edema. She denies any fevers, chills, or weight loss. She states that she feels as though she has recovered fairly well from her recent spine surgery.  He felt Michele Wood would be a candidate for a Minimally Invasive Resection of left atrial myxoma. Michele risks and benefits of Michele procedure were explained to Michele Wood and she was agreeable to proceed.    Hospital Course:   Michele Wood [facility].  She was taken to [facility] operating room and underwent Minimally Invasive Resection of Left Atrial Myxoma.  She tolerated Michele procedure without difficulty, she was extubated and taken to [facility] SICU in stable condition.  Michele Wood was acidotic post operatively.  She was treated with BIPAP and responded well.  She did require pressor support post operatively.  She was started on gentle diuretics for post operative hypervolemia.  Her chest tubes and arterial lines were removed without difficulty.  She is  maintaining NSR.  Her pacing wires have been removed.  She had good control of her blood sugars during her hospitalization.  She will be restarted on her home regimen of Metformin at discharge.  She is ambulating without difficulty.  Her incisions are healing without evidence of infection.  She is medically stable for discharge home today.       Significant Diagnostic Studies: cardiac graphics:   Echocardiogram:   1. Michele left ventricle has normal systolic function, with an ejection fraction of 60-65%. Michele cavity size was normal.  2. There is a round mass in Michele left atrium . measuing 2.3 x 1.7 cm. It is largely immobile. There is a stalk that appears to connect to Michele atrial septum close to Michele fossa ovalis. Appears consistent with atrial myxoma.  Treatments: surgery:    Minimally-Invasive Resection of Left Atrial Myxoma             Right anterior mini thoracotomy approach             Autologous pericardial patch closure of interatrial septum  Discharge Exam: Blood pressure 105/76, pulse 87, temperature 98 F (36.7 C), temperature source Oral, resp. rate (!) 21, height 5\' 2"  (1.575 m), weight 105 kg, last menstrual period 09/01/2013, SpO2 99 %.  General appearance:alert, cooperative and no distress Neurologic:intact Heart:SR, BBB unchanged. No significant arrhythmias Lungs:Breath sounds clear, good sats on RA.  Extremities:No edema. Wound:Right chest incision is intact and dry.   Disposition:    Discharge Medications:  Michele Wood has been discharged on:   1.Beta Blocker:  Yes [  X ]  No   [   ]                              If No, reason:  2.Ace Inhibitor/ARB: Yes [ x  ]                                     No  [    ]                                     If No, reason:  3.Statin:   Yes [  X ]                  No  [   ]                  If No, reason:  4.Ecasa:  Yes  [  X ]                  No   [   ]                  If No,  reason:     Discharge Instructions    Amb Referral to Cardiac Rehabilitation   Complete by: As directed    Diagnosis: Other   After initial evaluation and assessments completed: Virtual Based Care may be provided alone or in conjunction with Phase 2 Cardiac Rehab based on Wood barriers.: Yes     Allergies as of 06/14/2019      Reactions   Codeine Sulfate Rash   Milk-related Compounds Diarrhea, Other (See Comments)   Diarrhea and stomach upset      Medication List    STOP taking these medications   amLODipine 10 MG tablet Commonly known as: NORVASC   Oxycodone HCl 10 MG Tabs     TAKE these medications   aspirin 81 MG EC tablet TAKE 1 TABLET BY MOUTH EVERY DAY   atorvastatin 40 MG tablet Commonly known as: LIPITOR Take 1 tablet (40 mg total) by mouth daily.   DULoxetine 60 MG capsule Commonly known as: CYMBALTA Take 1 capsule (60 mg total) by mouth daily.   furosemide 40 MG tablet Commonly known as: LASIX Take 1 tablet (40 mg total) by mouth daily for 5 days.   hydrochlorothiazide 25 MG tablet Commonly known as: HYDRODIURIL Take 1 tablet (25 mg total) by mouth daily.   hydrOXYzine 25 MG capsule Commonly known as: Vistaril Take 1 capsule (25 mg total) by mouth at bedtime as needed for anxiety.   lisinopril 10 MG tablet Commonly known as: ZESTRIL Take 1 tablet (10 mg total) by mouth daily. What changed:   medication strength  how much to take   Lurasidone HCl 60 MG Tabs Take 1 tablet (60 mg total) by mouth daily with breakfast.   metFORMIN 500 MG tablet Commonly known as: GLUCOPHAGE Take 1 tablet (500 mg total) by mouth daily with breakfast.   methocarbamol 750 MG tablet Commonly known as: ROBAXIN Take 1 tablet (750 mg total) by mouth every 6 (six) hours as needed for muscle spasms.   metoprolol tartrate 25 MG tablet Commonly known as: LOPRESSOR Take 1 tablet (25 mg total) by mouth 2 (two) times daily.   montelukast 10 MG  tablet Commonly  known as: Singulair Take 1 tablet (10 mg total) by mouth at bedtime. What changed: when to take this   potassium chloride SA 20 MEQ tablet Commonly known as: K-DUR Take 1 tablet (20 mEq total) by mouth daily for 5 days.   traMADol 50 MG tablet Commonly known as: ULTRAM Take 1-2 tablets (50-100 mg total) by mouth every 4 (four) hours as needed for up to 7 days for moderate pain.   triamcinolone 55 MCG/ACT Aero nasal inhaler Commonly known as: Nasacort AQ Place 2 sprays into Michele nose daily.   Vitamin D3 50 MCG (2000 UT) Tabs Take 2,000 Units by mouth daily. What changed: when to take this      Follow-up Information    [facility] [place] Follow up on 06/22/2019.   Specialty: Cardiothoracic Surgery Why: Appointment is at 1:00, please get CXR at 12:30 at [facility] located on first floor of our office building Contact information: [place], [place] [phone]       Erlene Quan, PA-C Follow up on 06/23/2019.   Specialties: Cardiology, Radiology Why: Appointment is at [place] information: [address] [place] [address] [id number]           Signed: Antony Odea, PA-C 06/14/2019, 7:52 AM

## 2019-06-09 NOTE — Progress Notes (Signed)
TCTS BRIEF SICU PROGRESS NOTE  1 Day Post-Op  S/P Procedure(s) (LRB): MINIMALLY INVASIVE RESECTION OF LEFT ATRIAL MYXOMA (N/A) TRANSESOPHAGEAL ECHOCARDIOGRAM (TEE) (N/A)   Doing well NSR w/ stable BP Breathing comfortably w/ O2 sats 99-100% Spontaneously diuresing  Plan: Continue routine care  [name], MD 06/09/2019 4:14 PM

## 2019-06-10 ENCOUNTER — Inpatient Hospital Stay (HOSPITAL_COMMUNITY): Payer: Medicare Other

## 2019-06-10 LAB — CBC
HCT: 28 % — ABNORMAL LOW (ref 36.0–46.0)
Hemoglobin: 8.8 g/dL — ABNORMAL LOW (ref 12.0–15.0)
MCH: 24.8 pg — ABNORMAL LOW (ref 26.0–34.0)
MCHC: 31.4 g/dL (ref 30.0–36.0)
MCV: 78.9 fL — ABNORMAL LOW (ref 80.0–100.0)
Platelets: 268 10*3/uL (ref 150–400)
RBC: 3.55 MIL/uL — ABNORMAL LOW (ref 3.87–5.11)
RDW: 13.7 % (ref 11.5–15.5)
WBC: 11.4 10*3/uL — ABNORMAL HIGH (ref 4.0–10.5)
nRBC: 0 % (ref 0.0–0.2)

## 2019-06-10 LAB — GLUCOSE, CAPILLARY
Glucose-Capillary: 93 mg/dL (ref 70–99)
Glucose-Capillary: 89 mg/dL (ref 70–99)
Glucose-Capillary: 88 mg/dL (ref 70–99)
Glucose-Capillary: 96 mg/dL (ref 70–99)
Glucose-Capillary: 114 mg/dL — ABNORMAL HIGH (ref 70–99)

## 2019-06-10 LAB — BASIC METABOLIC PANEL
Anion gap: 7 (ref 5–15)
BUN: 8 mg/dL (ref 6–20)
CO2: 27 mmol/L (ref 22–32)
Calcium: 8 mg/dL — ABNORMAL LOW (ref 8.9–10.3)
Chloride: 105 mmol/L (ref 98–111)
Creatinine, Ser: 0.72 mg/dL (ref 0.44–1.00)
GFR calc Af Amer: >60 mL/min (ref >60)
GFR calc non Af Amer: >60 mL/min (ref >60)
Glucose, Bld: 100 mg/dL — ABNORMAL HIGH (ref 70–99)
Potassium: 3.6 mmol/L (ref 3.5–5.1)
Sodium: 139 mmol/L (ref 135–145)

## 2019-06-10 MED ORDER — FUROSEMIDE 10 MG/ML IJ SOLN
20.0000 mg | Freq: Once | INTRAMUSCULAR | Status: AC
  Administered 2019-06-10: 17:00:00 20 mg via INTRAVENOUS
  Filled 2019-06-10: qty 2

## 2019-06-10 MED ORDER — SODIUM CHLORIDE 0.9% FLUSH
3.0000 mL | Freq: Two times a day (BID) | INTRAVENOUS | Status: DC
  Administered 2019-06-10 – 2019-06-13 (×7): 3 mL via INTRAVENOUS

## 2019-06-10 MED ORDER — SODIUM CHLORIDE 0.9 % IV SOLN: 250.0000 mL | INTRAVENOUS | Status: DC | PRN

## 2019-06-10 MED ORDER — INSULIN ASPART 100 UNIT/ML ~~LOC~~ SOLN
0.0000 [IU] | Freq: Three times a day (TID) | SUBCUTANEOUS | Status: DC
  Administered 2019-06-11: 2 [IU] via SUBCUTANEOUS

## 2019-06-10 MED ORDER — SODIUM CHLORIDE 0.9% FLUSH: 3.0000 mL | INTRAVENOUS | Status: DC | PRN

## 2019-06-10 MED ORDER — POTASSIUM CHLORIDE CRYS ER 20 MEQ PO TBCR
20.0000 meq | EXTENDED_RELEASE_TABLET | ORAL | Status: AC
  Administered 2019-06-10 (×3): 20 meq via ORAL
  Filled 2019-06-10 (×3): qty 1

## 2019-06-10 MED ORDER — MOVING RIGHT ALONG BOOK
Freq: Once | Status: AC
  Administered 2019-06-10: 09:00:00
  Filled 2019-06-10: qty 1

## 2019-06-10 NOTE — Addendum Note (Signed)
Addendum  created 06/10/19 0818 by Roberts Gaudy, MD   Order list changed

## 2019-06-10 NOTE — Progress Notes (Addendum)
TCTS DAILY ICU PROGRESS NOTE                   [place]            [place] [address]          [phone]   2 Days Post-Op Procedure(s) (LRB): MINIMALLY INVASIVE RESECTION OF LEFT ATRIAL MYXOMA (N/A) TRANSESOPHAGEAL ECHOCARDIOGRAM (TEE) (N/A)  Total Length of Stay:  LOS: 2 days   Subjective:  More pain at chest tube sites today.  Up in chair, eating breakfast.  Denies N/V, has moved bowels.  + Ambulation  Objective: Vital signs in last 24 hours: Temp:  [97.9 F (36.6 C)-99.1 F (37.3 C)] 98 F (36.7 C) (08/27 0700) Pulse Rate:  [69-80] 78 (08/27 0700) Cardiac Rhythm: Normal sinus rhythm;Bundle branch block (08/27 0700) Resp:  [10-23] 18 (08/27 0700) BP: (108-135)/(68-107) 118/76 (08/27 0700) SpO2:  [92 %-100 %] 93 % (08/27 0700) Arterial Line BP: (98)/(72) 98/72 (08/26 0800) Weight:  [109.8 kg] 109.8 kg (08/27 0600)  Filed Weights   06/09/19 0500 06/10/19 0600  Weight: 109.5 kg 109.8 kg    Weight change: 0.3 kg   Intake/Output from previous day: 08/26 0701 - 08/27 0700 In: 871.9 [P.O.:450; I.V.:120.4; IV Piggyback:301.6] Out: 3305 [Urine:3050; Chest Tube:255]  Current Meds: Scheduled Meds: . acetaminophen  1,000 mg Oral Q6H  . aspirin EC  81 mg Oral Daily  . atorvastatin  40 mg Oral Daily  . bisacodyl  10 mg Oral Daily   Or  . bisacodyl  10 mg Rectal Daily  . chlorhexidine  15 mL Mouth Rinse BID  . Chlorhexidine Gluconate Cloth  6 each Topical Daily  . docusate sodium  200 mg Oral Daily  . DULoxetine  60 mg Oral Daily  . enoxaparin (LOVENOX) injection  30 mg Subcutaneous QHS  . insulin aspart  0-24 Units Subcutaneous Q4H  . mouth rinse  15 mL Mouth Rinse q12n4p  . metoprolol tartrate  12.5 mg Oral BID  . montelukast  10 mg Oral Daily  . mupirocin ointment  1 application Nasal BID  . pantoprazole  40 mg Oral Daily  . potassium chloride  20 mEq Oral Q4H  . sodium chloride flush  10-40 mL Intracatheter Q12H  . sodium chloride flush   3 mL Intravenous Q12H   Continuous Infusions: . sodium chloride Stopped (06/09/19 2130)  . cefUROXime (ZINACEF)  IV Stopped (06/09/19 2103)  . lactated ringers    . lactated ringers 10 mL/hr at 06/09/19 1400   PRN Meds:.metoprolol tartrate, morphine injection, ondansetron (ZOFRAN) IV, oxyCODONE, sodium chloride flush, sodium chloride flush, traMADol  General appearance: alert, cooperative and no distress Heart: regular rate and rhythm Lungs: clear to auscultation bilaterally Abdomen: soft, non-tender; bowel sounds normal; no masses,  no organomegaly Extremities: edema trace Wound: aquacel in place  Lab Results: CBC: Recent Labs    06/09/19 2144 06/10/19 0231  WBC 13.3* 11.4*  HGB 9.1* 8.8*  HCT 29.2* 28.0*  PLT 292 268   BMET:  Recent Labs    06/09/19 0305 06/10/19 0231  NA 137 139  K 3.5 3.6  CL 103 105  CO2 25 27  GLUCOSE 111* 100*  BUN 8 8  CREATININE 0.63 0.72  CALCIUM 7.7* 8.0*    CMET: Lab Results  Component Value Date   WBC 11.4 (H) 06/10/2019   HGB 8.8 (L) 06/10/2019   HCT 28.0 (L) 06/10/2019   PLT 268 06/10/2019   GLUCOSE 100 (H)  06/10/2019   CHOL 154 05/13/2019   TRIG 191 (H) 05/13/2019   HDL 34 (L) 05/13/2019   LDLCALC 82 05/13/2019   ALT 11 06/04/2019   AST 20 06/04/2019   NA 139 06/10/2019   K 3.6 06/10/2019   CL 105 06/10/2019   CREATININE 0.72 06/10/2019   BUN 8 06/10/2019   CO2 27 06/10/2019   TSH 2.319 12/03/2013   INR 1.5 (H) 06/08/2019   HGBA1C 5.8 (H) 06/04/2019      PT/INR:  Recent Labs    06/08/19 1345  LABPROT 17.8*  INR 1.5*   Radiology: No results found.   Assessment/Plan: S/P Procedure(s) (LRB): MINIMALLY INVASIVE RESECTION OF LEFT ATRIAL MYXOMA (N/A) TRANSESOPHAGEAL ECHOCARDIOGRAM (TEE) (N/A)  1. CV- NSR, BP controlled-continue Lopressor, Lipitor 2. Pulm- CT output 255 yesterday, only 95 since 7 oclock last night, CXR without significant effusion, can possibly d/c chest tubes today 3. Renal- creatinine  WNL, weight is at baseline, will hold off on lasix 4. Expected post operative blood loss anemia, mild Hgb at 8.8 5. DM- cbgs controlled, will restart Metformin at discharge as blood sugar is around 100 currently 6. dispo- patient stable, maintaining NSR, possibly d/c chest tubes today, d/c foley, possibly transfer to [place] 06/10/2019 7:55 AM   I have seen and examined the patient and agree with the assessment and plan as outlined.  Looks great.  D/C pacing wires and Foley cath.  D/C tubes later today or tomorrow, depending on output.  Transfer 4E.  Anticipate likely D/C home 2-3 days.  Final pathology results c/w benign atrial myxoma.  [name], MD 06/10/2019 8:21 AM

## 2019-06-10 NOTE — Progress Notes (Signed)
Pacer wires removed, all wires are intact. Left subclavian introducer removed after, catheter inspected upon removal, intact. Vital signs monitored per protocol. Patient instructed to stay in bed for an hour, patient verbalized understanding. 06/10/19 1:15 PM

## 2019-06-10 NOTE — Progress Notes (Signed)
Patient ID: Michele Wood, female   DOB: 07/14/1961, 58 y.o.   MRN: [id number] TCTS Evening Rounds:  Hemodynamically stable in sinus rhythm.  sats 94% RA  Urine output good CT output low  Waiting on 4E bed.

## 2019-06-10 NOTE — Plan of Care (Signed)
Shift Summary;  Stable during shift.   Neuro: alert and oriented. MAE. Participating in ADLs/care.   Cardiovascular: HR stable 60-70s, SR/BBB. BP stable. On scheduled cardiac meds. Epicardial wires, capped. CT x 2 with scant output during shift.   GU: BM yesterday, + bowel sounds/gas. Tolerating diet.   GI: Foley, diuresing well with 3 L uo/ 4 hr. Anticipate d/c of foley today. K 3.6, replacing po per protocol.   Infection: afebrile, WBC 11.4, surgical dressing remain in place per orders but no sxs of infection, abx as ordered. Following appropriate HAI bundles, strict hand hygiene. Tx for MRSA nares.   Problem: Education: Goal: Knowledge of General Education information will improve Description: Including pain rating scale, medication(s)/side effects and non-pharmacologic comfort measures Outcome: Progressing   Problem: Clinical Measurements: Goal: Ability to maintain clinical measurements within normal limits will improve Outcome: Progressing Goal: Will remain free from infection Outcome: Progressing Goal: Respiratory complications will improve Outcome: Progressing Goal: Cardiovascular complication will be avoided Outcome: Progressing   Problem: Activity: Goal: Risk for activity intolerance will decrease Outcome: Progressing   Problem: Nutrition: Goal: Adequate nutrition will be maintained Outcome: Progressing   Problem: Coping: Goal: Level of anxiety will decrease Outcome: Progressing   Problem: Elimination: Goal: Will not experience complications related to bowel motility Outcome: Progressing   Problem: Pain Managment: Goal: General experience of comfort will improve Outcome: Progressing   Problem: Safety: Goal: Ability to remain free from injury will improve Outcome: Progressing   Problem: Skin Integrity: Goal: Risk for impaired skin integrity will decrease Outcome: Progressing   Problem: Education: Goal: Knowledge of the prescribed therapeutic regimen  will improve Outcome: Progressing   Problem: Activity: Goal: Risk for activity intolerance will decrease Outcome: Progressing   Problem: Cardiac: Goal: Will achieve and/or maintain hemodynamic stability Outcome: Progressing   Problem: Respiratory: Goal: Respiratory status will improve Outcome: Progressing   Problem: Skin Integrity: Goal: Wound healing without signs and symptoms of infection Outcome: Progressing   Problem: Urinary Elimination: Goal: Ability to achieve and maintain adequate renal perfusion and functioning will improve Outcome: Progressing

## 2019-06-11 LAB — BASIC METABOLIC PANEL
Anion gap: 10 (ref 5–15)
BUN: 9 mg/dL (ref 6–20)
CO2: 26 mmol/L (ref 22–32)
Calcium: 8.3 mg/dL — ABNORMAL LOW (ref 8.9–10.3)
Chloride: 102 mmol/L (ref 98–111)
Creatinine, Ser: 0.61 mg/dL (ref 0.44–1.00)
GFR calc Af Amer: >60 mL/min (ref >60)
GFR calc non Af Amer: >60 mL/min (ref >60)
Glucose, Bld: 103 mg/dL — ABNORMAL HIGH (ref 70–99)
Potassium: 3.7 mmol/L (ref 3.5–5.1)
Sodium: 138 mmol/L (ref 135–145)

## 2019-06-11 LAB — CBC
HCT: 29.7 % — ABNORMAL LOW (ref 36.0–46.0)
Hemoglobin: 9.1 g/dL — ABNORMAL LOW (ref 12.0–15.0)
MCH: 24.1 pg — ABNORMAL LOW (ref 26.0–34.0)
MCHC: 30.6 g/dL (ref 30.0–36.0)
MCV: 78.8 fL — ABNORMAL LOW (ref 80.0–100.0)
Platelets: 288 10*3/uL (ref 150–400)
RBC: 3.77 MIL/uL — ABNORMAL LOW (ref 3.87–5.11)
RDW: 14.1 % (ref 11.5–15.5)
WBC: 10.3 10*3/uL (ref 4.0–10.5)
nRBC: 0 % (ref 0.0–0.2)

## 2019-06-11 LAB — GLUCOSE, CAPILLARY
Glucose-Capillary: 106 mg/dL — ABNORMAL HIGH (ref 70–99)
Glucose-Capillary: 111 mg/dL — ABNORMAL HIGH (ref 70–99)
Glucose-Capillary: 125 mg/dL — ABNORMAL HIGH (ref 70–99)
Glucose-Capillary: 94 mg/dL (ref 70–99)

## 2019-06-11 MED ORDER — POTASSIUM CHLORIDE CRYS ER 20 MEQ PO TBCR
20.0000 meq | EXTENDED_RELEASE_TABLET | ORAL | Status: AC
  Administered 2019-06-11 (×3): 20 meq via ORAL
  Filled 2019-06-11 (×3): qty 1

## 2019-06-11 MED ORDER — LISINOPRIL 10 MG PO TABS
10.0000 mg | ORAL_TABLET | Freq: Every day | ORAL | Status: DC
  Administered 2019-06-11 – 2019-06-14 (×4): 10 mg via ORAL
  Filled 2019-06-11 (×4): qty 1

## 2019-06-11 MED ORDER — POTASSIUM CHLORIDE CRYS ER 20 MEQ PO TBCR
20.0000 meq | EXTENDED_RELEASE_TABLET | Freq: Two times a day (BID) | ORAL | Status: DC
  Administered 2019-06-11 – 2019-06-13 (×4): 20 meq via ORAL
  Filled 2019-06-11 (×4): qty 1

## 2019-06-11 MED ORDER — FE FUMARATE-B12-VIT C-FA-IFC PO CAPS
1.0000 | ORAL_CAPSULE | Freq: Every day | ORAL | Status: DC
  Administered 2019-06-11 – 2019-06-14 (×4): 1 via ORAL
  Filled 2019-06-11 (×4): qty 1

## 2019-06-11 MED ORDER — LURASIDONE HCL 20 MG PO TABS
60.0000 mg | ORAL_TABLET | Freq: Every day | ORAL | Status: DC
  Administered 2019-06-11 – 2019-06-14 (×4): 60 mg via ORAL
  Filled 2019-06-11 (×4): qty 3

## 2019-06-11 MED ORDER — FUROSEMIDE 40 MG PO TABS
40.0000 mg | ORAL_TABLET | Freq: Two times a day (BID) | ORAL | Status: DC
  Administered 2019-06-11 – 2019-06-13 (×5): 40 mg via ORAL
  Filled 2019-06-11 (×5): qty 1

## 2019-06-11 MED FILL — Mannitol IV Soln 20%: INTRAVENOUS | Qty: 500 | Status: AC

## 2019-06-11 MED FILL — Heparin Sodium (Porcine) Inj 1000 Unit/ML: INTRAMUSCULAR | Qty: 30 | Status: AC

## 2019-06-11 MED FILL — Sodium Chloride IV Soln 0.9%: INTRAVENOUS | Qty: 2000 | Status: AC

## 2019-06-11 MED FILL — Sodium Bicarbonate IV Soln 8.4%: INTRAVENOUS | Qty: 50 | Status: AC

## 2019-06-11 MED FILL — Potassium Chloride Inj 2 mEq/ML: INTRAVENOUS | Qty: 40 | Status: AC

## 2019-06-11 MED FILL — Dexmedetomidine HCl in NaCl 0.9% IV Soln 400 MCG/100ML: INTRAVENOUS | Qty: 100 | Status: AC

## 2019-06-11 MED FILL — Heparin Sodium (Porcine) Inj 1000 Unit/ML: INTRAMUSCULAR | Qty: 10 | Status: AC

## 2019-06-11 MED FILL — Electrolyte-R (PH 7.4) Solution: INTRAVENOUS | Qty: 4000 | Status: AC

## 2019-06-11 MED FILL — Lidocaine HCl Local Preservative Free (PF) Inj 2%: INTRAMUSCULAR | Qty: 15 | Status: AC

## 2019-06-11 NOTE — Progress Notes (Signed)
      [address]       ,[place] [address]             [phone]      POD # 3 resection of myxoma  Up in chair  BP 117/87   Pulse 83   Temp 98.2 F (36.8 C)   Resp 20   Ht 5\' 2"  (1.575 m)   Wt 109.6 kg   LMP 09/01/2013   SpO2 96%   BMI 44.19 kg/m   Intake/Output Summary (Last 24 hours) at 06/11/2019 1722 Last data filed at 06/11/2019 1000 Gross per 24 hour  Intake 720 ml  Output 191 ml  Net 529 ml  UO not yet recorded  No PM labs  [place] C. Roxan Hockey, MD [facility] [phone]

## 2019-06-11 NOTE — Progress Notes (Addendum)
CARDIAC REHAB PHASE I   PRE:  Rate/Rhythm: 91 SR    BP: sitting 110/86    SaO2: 92 RA  MODE:  Ambulation: 370 ft   POST:  Rate/Rhythm: 102 ST    BP: sitting 126/75    SaO2: 93 RA  Pt walked x2 earlier. Willing to walk again. Used RW this time (she has at home). Able to move out of bed, steady with RW in hall, standby assist. Pt has wheeze/stridor but did not seem to be bothered. Rn sts this has been present since this am, doing better. Return to bed. Discussed IS, restrictions, exercise at home, diet, and CRPII. Will refer to [place].  Pt is interested in participating in Virtual Cardiac and Pulmonary Rehab. Pt advised that Virtual Cardiac and Pulmonary Rehab is provided at no cost to the patient.  Checklist:  1. Pt has smart device  ie smartphone and/or ipad for downloading an app  Yes 2. Reliable internet/wifi service    Yes 3. Understands how to use their smartphone and navigate within an app.  Yes  Pt verbalized understanding and is in agreement.  [place], ACSM 06/11/2019 2:58 PM

## 2019-06-11 NOTE — Progress Notes (Signed)
      [address]       [place] [address]             [phone]        CARDIOTHORACIC SURGERY PROGRESS NOTE   R3 Days Post-Op Procedure(s) (LRB): MINIMALLY INVASIVE RESECTION OF LEFT ATRIAL MYXOMA (N/A) TRANSESOPHAGEAL ECHOCARDIOGRAM (TEE) (N/A)  Subjective: Looks good although some cough this morning.  No chest pain.  No SOB.  Just ate breakfast.  Objective: Vital signs: BP Readings from Last 1 Encounters:  06/11/19 126/84   Pulse Readings from Last 1 Encounters:  06/11/19 84   Resp Readings from Last 1 Encounters:  06/11/19 19   Temp Readings from Last 1 Encounters:  06/11/19 97.9 F (36.6 C) (Oral)    Hemodynamics:    Physical Exam:  Rhythm:   sinus  Breath sounds: Scattered rhonchi  Heart sounds:  RRR  Incisions:  Dressing dry, intact  Abdomen:  Soft, non-distended, non-tender  Extremities:  Warm, well-perfused  Chest tubes:  low volume thin serosanguinous output, no air leak    Intake/Output from previous day: 08/27 0701 - 08/28 0700 In: 36 [P.O.:960; I.V.:10] Out: 691 [Urine:481; Chest Tube:210] Intake/Output this shift: No intake/output data recorded.  Lab Results:  CBC: Recent Labs    06/10/19 0231 06/11/19 0216  WBC 11.4* 10.3  HGB 8.8* 9.1*  HCT 28.0* 29.7*  PLT 268 288    BMET:  Recent Labs    06/10/19 0231 06/11/19 0216  NA 139 138  K 3.6 3.7  CL 105 102  CO2 27 26  GLUCOSE 100* 103*  BUN 8 9  CREATININE 0.72 0.61  CALCIUM 8.0* 8.3*     PT/INR:   Recent Labs    06/08/19 1345  LABPROT 17.8*  INR 1.5*    CBG (last 3)  Recent Labs    06/10/19 2057 06/11/19 0014 06/11/19 0815  GLUCAP 114* 111* 125*    ABG    Component Value Date/Time   PHART 7.337 (L) 06/08/2019 1816   PCO2ART 39.1 06/08/2019 1816   PO2ART 127.0 (H) 06/08/2019 1816   HCO3 20.9 06/08/2019 1816   TCO2 24 06/08/2019 1849   ACIDBASEDEF 4.0 (H) 06/08/2019 1816   O2SAT 99.0 06/08/2019 1816    CXR: n/a   Assessment/Plan: S/P Procedure(s) (LRB): MINIMALLY INVASIVE RESECTION OF LEFT ATRIAL MYXOMA (N/A) TRANSESOPHAGEAL ECHOCARDIOGRAM (TEE) (N/A)  Overall making good progress POD3 Maintaining NSR w/ stable BP Breathing comfortably although slight cough this morning Expected post op acute blood loss anemia, stable Expected post op volume excess, weight reportedly 3 kg > preop, UOP adequate Type II diabetes mellitus, excellent glycemic control   D/C chest tubes  Mobilize, pulmonary toilet  Diuresis  Restart metformin when CBG's start trending up  Restart ACE-I at reduced dose and continue to hold amlodipine for now, resume if/when BP rises  Await bed on 4E for transfer  Anticipate possible D/C home 1-2 days   [name], MD 06/11/2019 8:20 AM

## 2019-06-11 NOTE — Progress Notes (Signed)
Pt with barking type cough. Pt 98% on 2L nasal cannula. Pt denies SOB at this time. Neck auscultated, pt with good air movement and no stridor heard. RT will continue to monitor.

## 2019-06-12 ENCOUNTER — Inpatient Hospital Stay (HOSPITAL_COMMUNITY): Payer: Medicare Other

## 2019-06-12 LAB — GLUCOSE, CAPILLARY: Glucose-Capillary: 92 mg/dL (ref 70–99)

## 2019-06-12 NOTE — Progress Notes (Signed)
4 Days Post-Op Procedure(s) (LRB): MINIMALLY INVASIVE RESECTION OF LEFT ATRIAL MYXOMA (N/A) TRANSESOPHAGEAL ECHOCARDIOGRAM (TEE) (N/A) Subjective: No complaints  Objective: Vital signs in last 24 hours: Temp:  [97.4 F (36.3 C)-98.4 F (36.9 C)] 97.4 F (36.3 C) (08/29 0800) Pulse Rate:  [83-101] 93 (08/29 0700) Cardiac Rhythm: Normal sinus rhythm;Bundle branch block (08/29 0800) Resp:  [10-28] 22 (08/29 0800) BP: (109-136)/(78-107) 119/107 (08/29 0800) SpO2:  [93 %-100 %] 100 % (08/29 0800) Weight:  [106.2 kg] 106.2 kg (08/29 0500)  Hemodynamic parameters for last 24 hours:    Intake/Output from previous day: 08/28 0701 - 08/29 0700 In: 960 [P.O.:960] Out: 40 [Chest Tube:40] Intake/Output this shift: Total I/O In: 240 [P.O.:240] Out: -   General appearance: alert, cooperative and no distress Neurologic: intact Heart: regular rate and rhythm Lungs: diminished breath sounds bibasilar  Lab Results: Recent Labs    06/10/19 0231 06/11/19 0216  WBC 11.4* 10.3  HGB 8.8* 9.1*  HCT 28.0* 29.7*  PLT 268 288   BMET:  Recent Labs    06/10/19 0231 06/11/19 0216  NA 139 138  K 3.6 3.7  CL 105 102  CO2 27 26  GLUCOSE 100* 103*  BUN 8 9  CREATININE 0.72 0.61  CALCIUM 8.0* 8.3*    PT/INR: No results for input(s): LABPROT, INR in the last 72 hours. ABG    Component Value Date/Time   PHART 7.337 (L) 06/08/2019 1816   HCO3 20.9 06/08/2019 1816   TCO2 24 06/08/2019 1849   ACIDBASEDEF 4.0 (H) 06/08/2019 1816   O2SAT 99.0 06/08/2019 1816   CBG (last 3)  Recent Labs    06/11/19 1206 06/11/19 2216 06/12/19 0644  GLUCAP 106* 94 92    Assessment/Plan: S/P Procedure(s) (LRB): MINIMALLY INVASIVE RESECTION OF LEFT ATRIAL MYXOMA (N/A) TRANSESOPHAGEAL ECHOCARDIOGRAM (TEE) (N/A) Looks good  CV- stable in SR RESP- continue IS RENAL- no issues ENDO- CBG well controlled, DC SSI Continue cardiac rehab Possibly home tomorrow if she continues to progress   LOS: 4  days    Melrose Nakayama 06/12/2019

## 2019-06-12 NOTE — Progress Notes (Signed)
CARDIAC REHAB PHASE I   PRE:  Rate/Rhythm: 95 SR  BP:  Sitting: 98 RA        SaO2: 117/85  MODE:  Ambulation: 270 ft   POST:  Rate/Rhythm: 106 SR w/ frequent PVC pairs and short runs of Vtach  BP:  Sitting: 124/87        SaO2: 100 RA  1019 - 1048  Pt ambulated 270 ft with walker and assistance x1. Pt denies CP and SOB. Gait slow and steady. Pt was having frequent PVC pairs while walking and a few small runs of Vtach. RN noticed ST elevation when she returned to the room. RN ordered 12 lead EKG. Pt left in bed with RNs in room.   Philis Kendall, MS, ACSM CEP 06/12/2019 10:43 AM

## 2019-06-12 NOTE — Progress Notes (Signed)
Attempted to contact patient's sister, Wilmon Pali, to inform her of pt transfering to [place]. No answer from sister. Unable to leave voicemail.   Alma Friendly RN  06/12/19 1302

## 2019-06-13 MED ORDER — METOPROLOL TARTRATE 25 MG PO TABS
25.0000 mg | ORAL_TABLET | Freq: Two times a day (BID) | ORAL | Status: DC
  Administered 2019-06-13 – 2019-06-14 (×2): 25 mg via ORAL
  Filled 2019-06-13 (×2): qty 1

## 2019-06-13 MED ORDER — FUROSEMIDE 40 MG PO TABS
40.0000 mg | ORAL_TABLET | Freq: Every day | ORAL | Status: DC
  Administered 2019-06-14: 40 mg via ORAL
  Filled 2019-06-13: qty 1

## 2019-06-13 MED ORDER — POTASSIUM CHLORIDE CRYS ER 20 MEQ PO TBCR
20.0000 meq | EXTENDED_RELEASE_TABLET | Freq: Every day | ORAL | Status: DC
  Administered 2019-06-14: 20 meq via ORAL
  Filled 2019-06-13: qty 1

## 2019-06-13 NOTE — Progress Notes (Addendum)
5 Days Post-Op Procedure(s) (LRB): MINIMALLY INVASIVE RESECTION OF LEFT ATRIAL MYXOMA (N/A) TRANSESOPHAGEAL ECHOCARDIOGRAM (TEE) (N/A) Subjective: Up in the bedside chair. Feels good, no complaints.   Objective: Vital signs in last 24 hours: Temp:  [98 F (36.7 C)-98.8 F (37.1 C)] 98 F (36.7 C) (08/30 0811) Pulse Rate:  [87-103] 103 (08/30 1003) Cardiac Rhythm: Normal sinus rhythm (08/30 0700) Resp:  [10-24] 20 (08/30 0811) BP: (89-140)/(55-105) 122/90 (08/30 0811) SpO2:  [96 %-100 %] 100 % (08/30 0811) Weight:  [105.1 kg] 105.1 kg (08/30 0607)    Intake/Output from previous day: 08/29 0701 - 08/30 0700 In: 680 [P.O.:680] Out: -  Intake/Output this shift: Total I/O In: 13 [I.V.:13] Out: -   General appearance: alert, cooperative and no distress Neurologic: intact Heart: SR, BBB, few PVC's and brief VT whild walking yesterday.  Lungs: clear to auscultation bilaterally Extremities: No edema. Wound: Right chest incision is intact and dry.   Lab Results: Recent Labs    06/11/19 0216  WBC 10.3  HGB 9.1*  HCT 29.7*  PLT 288   BMET:  Recent Labs    06/11/19 0216  NA 138  K 3.7  CL 102  CO2 26  GLUCOSE 103*  BUN 9  CREATININE 0.61  CALCIUM 8.3*    PT/INR: No results for input(s): LABPROT, INR in the last 72 hours. ABG    Component Value Date/Time   PHART 7.337 (L) 06/08/2019 1816   HCO3 20.9 06/08/2019 1816   TCO2 24 06/08/2019 1849   ACIDBASEDEF 4.0 (H) 06/08/2019 1816   O2SAT 99.0 06/08/2019 1816   CBG (last 3)  Recent Labs    06/11/19 1206 06/11/19 2216 06/12/19 0644  GLUCAP 106* 94 92    Assessment/Plan: S/P Procedure(s) (LRB): MINIMALLY INVASIVE RESECTION OF LEFT ATRIAL MYXOMA (N/A) TRANSESOPHAGEAL ECHOCARDIOGRAM (TEE) (N/A)  -POD5 Minimally invasive resection of atrial myxoma. Making progress but not yet independent with mobility. Continue working on PT.   -Tachycardia, brief VT while ambulating yesterday- increase metoprolol to 25mg   po BID. Observe rhythm.  -DVT PPX-continue Lovenox.   -Expected acute blood loss anemia-On Trinsicon, repeat lab in am.   -Post-op volume excess-Wt now 4kg below pre-op. No peripheral edema. Decrease Lasix to once daily.  -Anticipate discharge in AM   LOS: 5 days    Antony Odea, PA-C [phone] 06/13/2019   Patient seen and examined ST's are due to a LBBB, no CAD Will observe for any PVCs  Kees Idrovo C. Roxan Hockey, MD [facility] [phone]

## 2019-06-14 LAB — CBC
HCT: 31.7 % — ABNORMAL LOW (ref 36.0–46.0)
Hemoglobin: 9.8 g/dL — ABNORMAL LOW (ref 12.0–15.0)
MCH: 24.3 pg — ABNORMAL LOW (ref 26.0–34.0)
MCHC: 30.9 g/dL (ref 30.0–36.0)
MCV: 78.7 fL — ABNORMAL LOW (ref 80.0–100.0)
Platelets: 374 10*3/uL (ref 150–400)
RBC: 4.03 MIL/uL (ref 3.87–5.11)
RDW: 14.4 % (ref 11.5–15.5)
WBC: 9.9 10*3/uL (ref 4.0–10.5)
nRBC: 0 % (ref 0.0–0.2)

## 2019-06-14 LAB — BASIC METABOLIC PANEL
Anion gap: 10 (ref 5–15)
BUN: 12 mg/dL (ref 6–20)
CO2: 25 mmol/L (ref 22–32)
Calcium: 8.8 mg/dL — ABNORMAL LOW (ref 8.9–10.3)
Chloride: 102 mmol/L (ref 98–111)
Creatinine, Ser: 0.76 mg/dL (ref 0.44–1.00)
GFR calc Af Amer: >60 mL/min (ref >60)
GFR calc non Af Amer: >60 mL/min (ref >60)
Glucose, Bld: 99 mg/dL (ref 70–99)
Potassium: 3.7 mmol/L (ref 3.5–5.1)
Sodium: 137 mmol/L (ref 135–145)

## 2019-06-14 LAB — GLUCOSE, CAPILLARY: Glucose-Capillary: 100 mg/dL — ABNORMAL HIGH (ref 70–99)

## 2019-06-14 MED ORDER — LISINOPRIL 10 MG PO TABS: 10.0000 mg | ORAL_TABLET | Freq: Every day | ORAL | 2 refills | Status: DC

## 2019-06-14 MED ORDER — TRAMADOL HCL 50 MG PO TABS: 50.0000 mg | ORAL_TABLET | ORAL | 0 refills | Status: AC | PRN

## 2019-06-14 MED ORDER — POTASSIUM CHLORIDE CRYS ER 20 MEQ PO TBCR: 20.0000 meq | EXTENDED_RELEASE_TABLET | Freq: Every day | ORAL | 0 refills | Status: DC

## 2019-06-14 MED ORDER — METOPROLOL TARTRATE 25 MG PO TABS: 25.0000 mg | ORAL_TABLET | Freq: Two times a day (BID) | ORAL | 2 refills | Status: DC

## 2019-06-14 MED ORDER — FUROSEMIDE 40 MG PO TABS: 40.0000 mg | ORAL_TABLET | Freq: Every day | ORAL | 0 refills | Status: DC

## 2019-06-14 NOTE — Progress Notes (Signed)
CARDIAC REHAB PHASE I   PRE:  Rate/Rhythm: 96 SR BBB  BP:  Supine:   Sitting: 111/81  Standing:    SaO2: 97%RA  MODE:  Ambulation: 370 ft   POST:  Rate/Rhythm: 117 ST  BP:  Supine:   Sitting: 129/91  Standing:    SaO2: 96%RA 0837-0903 Pt walked 370 ft on RA with cane and minimal asst. Tolerated well. Stopped once to rest. Pt stated she did not have an IS as hers broke. Gave her new one to take home and encouraged use. Pt stated no questions re ed done. To recliner after walk.  Encouraged walking for ex.   Graylon Good, RN BSN  06/14/2019 9:00 AM

## 2019-06-14 NOTE — Care Management Important Message (Signed)
Important Message  Patient Details  Name: Michele Wood MRN: [id number] Date of Birth: 1961-08-10   Medicare Important Message Given:  Yes  Mailed to pt. Since pt. Had discharged.    Holli Humbles Smith 06/14/2019, 1:33 PM

## 2019-06-14 NOTE — Addendum Note (Signed)
Addendum  created 06/14/19 0945 by Josephine Igo, CRNA   Order list changed

## 2019-06-14 NOTE — Progress Notes (Addendum)
6 Days Post-Op Procedure(s) (LRB): MINIMALLY INVASIVE RESECTION OF LEFT ATRIAL MYXOMA (N/A) TRANSESOPHAGEAL ECHOCARDIOGRAM (TEE) (N/A) Subjective: Rested well. No complaints, anxious to return home.   Objective: Vital signs in last 24 hours: Temp:  [98 F (36.7 C)-99.4 F (37.4 C)] 98 F (36.7 C) (08/31 0447) Pulse Rate:  [86-103] 87 (08/31 0451) Cardiac Rhythm: Sinus tachycardia;Bundle branch block (08/30 1901) Resp:  [15-26] 21 (08/31 0451) BP: (101-122)/(76-90) 105/76 (08/31 0451) SpO2:  [97 %-100 %] 99 % (08/31 0451) Weight:  [105 kg] 105 kg (08/31 0451)     Intake/Output from previous day: 08/30 0701 - 08/31 0700 In: 733 [P.O.:720; I.V.:13] Out: -  Intake/Output this shift: No intake/output data recorded.  General appearance: alert, cooperative and no distress Neurologic: intact Heart: SR, BBB unchanged. No significant arrhythmias Lungs: Breath sounds clear, good sats on RA.  Extremities: No edema. Wound: Right chest incision is intact and dry.    Lab Results: Recent Labs    06/14/19 0314  WBC 9.9  HGB 9.8*  HCT 31.7*  PLT 374   BMET:  Recent Labs    06/14/19 0314  NA 137  K 3.7  CL 102  CO2 25  GLUCOSE 99  BUN 12  CREATININE 0.76  CALCIUM 8.8*    PT/INR: No results for input(s): LABPROT, INR in the last 72 hours. ABG    Component Value Date/Time   PHART 7.337 (L) 06/08/2019 1816   HCO3 20.9 06/08/2019 1816   TCO2 24 06/08/2019 1849   ACIDBASEDEF 4.0 (H) 06/08/2019 1816   O2SAT 99.0 06/08/2019 1816   CBG (last 3)  Recent Labs    06/11/19 1206 06/11/19 2216 06/12/19 0644  GLUCAP 106* 94 92    Assessment/Plan: S/P Procedure(s) (LRB): MINIMALLY INVASIVE RESECTION OF LEFT ATRIAL MYXOMA (N/A) TRANSESOPHAGEAL ECHOCARDIOGRAM (TEE) (N/A)  POD6 Minimally invasive resection of atrial myxoma. Doing better with mobility and self care.   -Tachycardia with stable BBB. No other significant arrhythmias past 24 hours.   -Expected acute  blood loss anemia-On Trinsicon, Hct trending up.  -Post-op volume excess-Wt now 4kg below pre-op. No peripheral edema.  -prepare for discharge later today.    LOS: 6 days    Antony Odea, PA-C [phone] 06/14/2019  I have seen and examined the patient and agree with the assessment and plan as outlined.  D/C home  [name], MD 06/14/2019 9:06 AM

## 2019-06-15 ENCOUNTER — Telehealth (HOSPITAL_COMMUNITY): Payer: Self-pay

## 2019-06-15 NOTE — Telephone Encounter (Signed)
Contact and spoke with pt in regards to CR, pt stated she would like to participate in our VCR program.  Pt is interested in participating in Virtual Cardiac and Pulmonary Rehab. Pt advised that Virtual Cardiac and Pulmonary Rehab is provided at no cost to the patient.  Checklist:  1. Pt has smart device  ie smartphone and/or ipad for downloading an app  Yes 2. Reliable internet/wifi service    Yes 3. Understands how to use their smartphone and navigate within an app.  Yes   Pt verbalized understanding and is in agreement.

## 2019-06-22 ENCOUNTER — Ambulatory Visit (INDEPENDENT_AMBULATORY_CARE_PROVIDER_SITE_OTHER): Payer: Medicare Other | Admitting: Physician Assistant

## 2019-06-22 ENCOUNTER — Other Ambulatory Visit: Payer: Self-pay

## 2019-06-22 ENCOUNTER — Other Ambulatory Visit: Payer: Self-pay | Admitting: Thoracic Surgery (Cardiothoracic Vascular Surgery)

## 2019-06-22 ENCOUNTER — Ambulatory Visit
Admission: RE | Admit: 2019-06-22 | Discharge: 2019-06-22 | Disposition: A | Discharge status: Home / Self Care | Payer: Medicare Other | Source: Ambulatory Visit | Attending: Thoracic Surgery (Cardiothoracic Vascular Surgery) | Admitting: Thoracic Surgery (Cardiothoracic Vascular Surgery)

## 2019-06-22 ENCOUNTER — Encounter: Payer: Self-pay | Admitting: Physician Assistant

## 2019-06-22 VITALS — BP 98/65 | HR 80 | Temp 98.6°F | Resp 16 | Ht 62.0 in | Wt 229.0 lb

## 2019-06-22 DIAGNOSIS — Z86018 Personal history of other benign neoplasm: Secondary | ICD-10-CM

## 2019-06-22 DIAGNOSIS — D151 Benign neoplasm of heart: Secondary | ICD-10-CM

## 2019-06-22 DIAGNOSIS — J9811 Atelectasis: Secondary | ICD-10-CM | POA: Diagnosis not present

## 2019-06-22 NOTE — Patient Instructions (Signed)
Make every effort to stay physically active, get some type of exercise on a regular basis, and stick to a "heart healthy diet".  The long term benefits for regular exercise and a healthy diet are critically important to your overall health and wellbeing.  You are encouraged to enroll and participate in the outpatient cardiac rehab program beginning as soon as practical.  You may return to driving an automobile as long as you are no longer requiring oral narcotic pain relievers during the daytime.  It would be wise to start driving only short distances during the daylight and gradually increase from there as you feel comfortable.  Please return to the office in 2 months for a follow-up appointment.

## 2019-06-22 NOTE — Progress Notes (Signed)
[address]       [place] [address]             [phone]      Michele Wood is a 58 y.o. female patient who underwent a minimally invasive resection of a left atrial myxoma with Dr. Roxy Manns on 06/08/2019. She was discharged on diuretics for fluid overload for a few days. Otherwise, her hospital stay was unremarkable. She is here today for a routine follow-up visit.    1. Atrial myxoma   2. S/P minimally-invasive resection of left atrial myxoma    Past Medical History:  Diagnosis Date  . Anginal pain ([place])   . Chronic back pain   . DDD (degenerative disc disease), cervical   . Depression   . Diabetes mellitus without complication ([place])   . Dyspnea   . Hypertension    at age 87  . Obesity   . Pancreatic cyst 05/31/2019   Incidental - discovered on CT scan  . s/p minimally-invasive resection of left atrial myxoma 06/08/2019  . Sinusitis   . Sleep apnea    at age 64   Past Surgical History Pertinent Negatives:  Procedure Date Noted  . COSMETIC SURGERY 11/30/2015   Scheduled Meds:  Current Outpatient Medications on File Prior to Visit  Medication Sig Dispense Refill  . aspirin 81 MG EC tablet TAKE 1 TABLET BY MOUTH EVERY DAY (Patient taking differently: Take 81 mg by mouth daily. ) 100 tablet 1  . atorvastatin (LIPITOR) 40 MG tablet Take 1 tablet (40 mg total) by mouth daily. 90 tablet 3  . Cholecalciferol (VITAMIN D3) 2000 UNITS TABS Take 2,000 Units by mouth daily. (Patient taking differently: Take 2,000 Units by mouth 2 (two) times a week. ) 30 tablet 11  . DULoxetine (CYMBALTA) 60 MG capsule Take 1 capsule (60 mg total) by mouth daily. 30 capsule 2  . hydrochlorothiazide (HYDRODIURIL) 25 MG tablet Take 1 tablet (25 mg total) by mouth daily. 90 tablet 3  . hydrOXYzine (VISTARIL) 25 MG capsule Take 1 capsule (25 mg total) by mouth at bedtime as needed for anxiety. 30 capsule 2  . lisinopril (ZESTRIL) 10 MG tablet Take 1 tablet (10 mg total) by  mouth daily. 30 tablet 2  . Lurasidone HCl 60 MG TABS Take 1 tablet (60 mg total) by mouth daily with breakfast. 30 tablet 2  . metFORMIN (GLUCOPHAGE) 500 MG tablet Take 1 tablet (500 mg total) by mouth daily with breakfast. 90 tablet 3  . methocarbamol (ROBAXIN) 750 MG tablet Take 1 tablet (750 mg total) by mouth every 6 (six) hours as needed for muscle spasms. 50 tablet 1  . metoprolol tartrate (LOPRESSOR) 25 MG tablet Take 1 tablet (25 mg total) by mouth 2 (two) times daily. 60 tablet 2  . montelukast (SINGULAIR) 10 MG tablet Take 1 tablet (10 mg total) by mouth at bedtime. (Patient taking differently: Take 10 mg by mouth daily. ) 30 tablet 3  . triamcinolone (NASACORT AQ) 55 MCG/ACT AERO nasal inhaler Place 2 sprays into the nose daily. 1 Inhaler 12   No current facility-administered medications on file prior to visit.     Allergies  Allergen Reactions  . Codeine Sulfate Rash  . Milk-Related Compounds Diarrhea and Other (See Comments)    Diarrhea and stomach upset   Active Problems:   * No active hospital problems. *  Blood pressure 98/65, pulse 80, temperature 98.6 F (37 C), resp. rate 16, height  5\' 2"  (1.575 m), weight 229 lb (103.9 kg), last menstrual period 09/01/2013, SpO2 95 %.  Subjective:   She I still having some soreness over her incision and some fatigue.She states she has more good days than bad days.   Objective   Cor: RRR Pulm: no murmur Abd: nontender  Wound:clean and dry. Chest tube sutures removed. There was a small amount of purulent drainage but it looks superficial.  Ext: no edema   CLINICAL DATA:  58 year old who underwent minimally invasive resection of LEFT atrial myxoma on 06/08/2019. Follow-up examination. Current history of hypertension and diabetes.  EXAM: CHEST - 2 VIEW  COMPARISON:  06/12/2019 and earlier, including CTA chest 05/31/2019.  FINDINGS: Cardiac silhouette upper normal in size, unchanged. Thoracic aorta tortuous, unchanged.  Hilar and mediastinal contours otherwise unremarkable. Linear atelectasis in the RIGHT UPPER LOBE, unchanged since the 06/12/2019 examination, new since the 06/04/2019 examination. Lungs otherwise clear. No localized airspace consolidation. No pleural effusions. No pneumothorax. Normal pulmonary vascularity. Degenerative changes involving the thoracic spine.  IMPRESSION: Linear atelectasis in the RIGHT UPPER LOBE. No acute cardiopulmonary disease otherwise.   Assessment & Plan :   Michele Wood is a 58 year old female with a past medical history of diabetes, hypertension, sleep apnea and obesity. She presents today s/p minimally invasive resection of a left atrial myxoma for her routine follow-up appointment.  She overall feels okay with some soreness and fatigue at times. She continued to use her incentive spirometer. Her appetite isn't back yet therefore I suggested glucerna for supplementation. I encouraged her to get enough protein for wound healing. She isn't taking much pain medication at this point just usually at night, therefore I cleared her to drive. She also is encouraged to participate in rehab. They called her and said there was no availability at this time due to [place]. I encouraged her to call them back in a few weeks for an update. I think she would really benefit from rehab. She seems motivated to feel better. She was encouraged to continue to walk and to use her incentive spirometer since she still does have some atelectasis on CXR. Chest xray was reviewed with the patient and all questions were answered to her satisfaction. She has a visit to see Cardiology tomorrow where they will likely lower or stop her lisinopril due to hypotension. She does not take her BP at home so they will need to monitor this closely. She was hypotensive today and does feel dizzy at times. No medication changes were done today. I think she is at her dry weight and can just continue her HCTZ.   She  will return to the office in 2 months to see Dr. Roxy Manns but certainly if she were to have any issues before then she can call our office for a sooner appointment. I did see some superficial purulent drainage while removing her chest tube sutures. I could not express any additional drainage. She is instructed to wash with soap and water and leave open to air. If drainage increased she is to call our office.     Elgie Collard 06/22/2019

## 2019-06-23 ENCOUNTER — Encounter: Payer: Self-pay | Admitting: Cardiology

## 2019-06-23 ENCOUNTER — Ambulatory Visit (INDEPENDENT_AMBULATORY_CARE_PROVIDER_SITE_OTHER): Payer: Medicare Other | Admitting: Cardiology

## 2019-06-23 VITALS — BP 90/68 | HR 100 | Temp 96.1°F | Ht 62.0 in | Wt 229.0 lb

## 2019-06-23 DIAGNOSIS — D151 Benign neoplasm of heart: Secondary | ICD-10-CM

## 2019-06-23 DIAGNOSIS — I10 Essential (primary) hypertension: Secondary | ICD-10-CM

## 2019-06-23 DIAGNOSIS — I209 Angina pectoris, unspecified: Secondary | ICD-10-CM

## 2019-06-23 DIAGNOSIS — M47816 Spondylosis without myelopathy or radiculopathy, lumbar region: Secondary | ICD-10-CM | POA: Diagnosis not present

## 2019-06-23 DIAGNOSIS — E119 Type 2 diabetes mellitus without complications: Secondary | ICD-10-CM

## 2019-06-23 DIAGNOSIS — I447 Left bundle-branch block, unspecified: Secondary | ICD-10-CM | POA: Diagnosis not present

## 2019-06-23 DIAGNOSIS — R079 Chest pain, unspecified: Secondary | ICD-10-CM | POA: Diagnosis not present

## 2019-06-23 DIAGNOSIS — I2510 Atherosclerotic heart disease of native coronary artery without angina pectoris: Secondary | ICD-10-CM | POA: Diagnosis not present

## 2019-06-23 LAB — BASIC METABOLIC PANEL
BUN/Creatinine Ratio: 14 (ref 9–23)
BUN: 19 mg/dL (ref 6–24)
CO2: 24 mmol/L (ref 20–29)
Calcium: 9.8 mg/dL (ref 8.7–10.2)
Chloride: 100 mmol/L (ref 96–106)
Creatinine, Ser: 1.38 mg/dL — ABNORMAL HIGH (ref 0.57–1.00)
GFR calc Af Amer: 49 mL/min/{1.73_m2} — ABNORMAL LOW (ref >59)
GFR calc non Af Amer: 42 mL/min/{1.73_m2} — ABNORMAL LOW (ref >59)
Glucose: 104 mg/dL — ABNORMAL HIGH (ref 65–99)
Potassium: 4 mmol/L (ref 3.5–5.2)
Sodium: 140 mmol/L (ref 134–144)

## 2019-06-23 LAB — CBC WITH DIFFERENTIAL/PLATELET
Basophils Absolute: 0.1 10*3/uL (ref 0.0–0.2)
Basos: 1 %
EOS (ABSOLUTE): 0.2 10*3/uL (ref 0.0–0.4)
Eos: 3 %
Hematocrit: 31 % — ABNORMAL LOW (ref 34.0–46.6)
Hemoglobin: 10 g/dL — ABNORMAL LOW (ref 11.1–15.9)
Immature Grans (Abs): 0 10*3/uL (ref 0.0–0.1)
Immature Granulocytes: 0 %
Lymphocytes Absolute: 2.6 10*3/uL (ref 0.7–3.1)
Lymphs: 36 %
MCH: 25.1 pg — ABNORMAL LOW (ref 26.6–33.0)
MCHC: 32.3 g/dL (ref 31.5–35.7)
MCV: 78 fL — ABNORMAL LOW (ref 79–97)
Monocytes Absolute: 0.5 10*3/uL (ref 0.1–0.9)
Monocytes: 7 %
Neutrophils Absolute: 3.8 10*3/uL (ref 1.4–7.0)
Neutrophils: 53 %
Platelets: 496 10*3/uL — ABNORMAL HIGH (ref 150–450)
RBC: 3.99 x10E6/uL (ref 3.77–5.28)
RDW: 14.4 % (ref 11.7–15.4)
WBC: 7.1 10*3/uL (ref 3.4–10.8)

## 2019-06-23 LAB — TSH: TSH: 2.2 u[IU]/mL (ref 0.450–4.500)

## 2019-06-23 MED ORDER — HYDROCHLOROTHIAZIDE 12.5 MG PO TABS: 12.5000 mg | ORAL_TABLET | Freq: Every day | ORAL | 1 refills | Status: DC

## 2019-06-23 NOTE — Assessment & Plan Note (Signed)
She is hypotensive today

## 2019-06-23 NOTE — Progress Notes (Signed)
Cardiology Office Note:    Date:  06/23/2019   ID:  Michele Wood, DOB [birth year], MRN [id number]  PCP:  Ladell Pier, MD  Cardiologist:  Quay Burow, MD  Electrophysiologist:  None   Referring MD: Ladell Pier, MD   No chief complaint on file. Weakness  History of Present Illness:    Michele Wood is a 58 y.o. female with a hx of chest pain.  She was seen in the ED in feb 2020 and noted to have a new LBBB c/w remote EKG.  She does have a family history of CAD- her brother had a stent placed.  The patient was referred to Dr Gwenlyn Found who saw her in March 2020. He ordered a coronary CT and echo.  CT revealed minor CAD with a Ca++ score of 26.  Echo showed an EF of 45-50% with Lt atrial myxoma.  She was cleared to have back surgery which was done in June.  She was evaluated by Dr Roxy Manns and on 06/08/2019 underwent minimally invasive Lt atrial myxoma resection.  She tolerated this well.  She is in the office today for follow up.  She has noted generalized weakness.  Her B/P is low- 92 systolic by me with large cuff.   Past Medical History:  Diagnosis Date  . Anginal pain ([place])   . Chronic back pain   . DDD (degenerative disc disease), cervical   . Depression   . Diabetes mellitus without complication ([place])   . Dyspnea   . Hypertension    at age 65  . Obesity   . Pancreatic cyst 05/31/2019   Incidental - discovered on CT scan  . s/p minimally-invasive resection of left atrial myxoma 06/08/2019  . Sinusitis   . Sleep apnea    at age 68    Past Surgical History:  Procedure Laterality Date  . ABDOMINAL HYSTERECTOMY    . BACK SURGERY    . BREAST BIOPSY Right 07/02/2006   [place] Core benign  . MINIMALLY INVASIVE EXCISION OF ATRIAL MYXOMA N/A 06/08/2019   Procedure: MINIMALLY INVASIVE RESECTION OF LEFT ATRIAL MYXOMA;  Surgeon: [name], MD;  Location: [facility];  Service: Open Heart Surgery;  Laterality: N/A;  . NASAL SINUS SURGERY    . TEE WITHOUT CARDIOVERSION N/A  05/17/2019   Procedure: TRANSESOPHAGEAL ECHOCARDIOGRAM (TEE);  Surgeon: Acie Fredrickson Wonda Cheng, MD;  Location: [facility];  Service: Cardiovascular;  Laterality: N/A;  . TEE WITHOUT CARDIOVERSION N/A 06/08/2019   Procedure: TRANSESOPHAGEAL ECHOCARDIOGRAM (TEE);  Surgeon: [name], MD;  Location: [facility];  Service: Open Heart Surgery;  Laterality: N/A;    Current Medications: Current Meds  Medication Sig  . aspirin 81 MG EC tablet TAKE 1 TABLET BY MOUTH EVERY DAY (Patient taking differently: Take 81 mg by mouth daily. )  . atorvastatin (LIPITOR) 40 MG tablet Take 1 tablet (40 mg total) by mouth daily.  . Cholecalciferol (VITAMIN D3) 2000 UNITS TABS Take 2,000 Units by mouth daily. (Patient taking differently: Take 2,000 Units by mouth 2 (two) times a week. )  . DULoxetine (CYMBALTA) 60 MG capsule Take 1 capsule (60 mg total) by mouth daily.  . hydrochlorothiazide (HYDRODIURIL) 25 MG tablet Take 1 tablet (25 mg total) by mouth daily.  . hydrOXYzine (VISTARIL) 25 MG capsule Take 1 capsule (25 mg total) by mouth at bedtime as needed for anxiety.  Marland Kitchen lisinopril (ZESTRIL) 10 MG tablet Take 1 tablet (10 mg total) by mouth daily.  . Lurasidone HCl  60 MG TABS Take 1 tablet (60 mg total) by mouth daily with breakfast.  . metFORMIN (GLUCOPHAGE) 500 MG tablet Take 1 tablet (500 mg total) by mouth daily with breakfast.  . methocarbamol (ROBAXIN) 750 MG tablet Take 1 tablet (750 mg total) by mouth every 6 (six) hours as needed for muscle spasms.  . metoprolol tartrate (LOPRESSOR) 25 MG tablet Take 1 tablet (25 mg total) by mouth 2 (two) times daily.  . montelukast (SINGULAIR) 10 MG tablet Take 1 tablet (10 mg total) by mouth at bedtime. (Patient taking differently: Take 10 mg by mouth daily. )  . triamcinolone (NASACORT AQ) 55 MCG/ACT AERO nasal inhaler Place 2 sprays into the nose daily.     Allergies:   Codeine sulfate and Milk-related compounds   Social History   Socioeconomic History  . Marital  status: Married    Spouse name: Michele Wood  . Number of children: 2  . Years of education: Not on file  . Highest education level: Some college, no degree  Occupational History  . Not on file  Social Needs  . Financial resource strain: Very hard  . Food insecurity    Worry: Often true    Inability: Often true  . Transportation needs    Medical: No    Non-medical: No  Tobacco Use  . Smoking status: Never Smoker  . Smokeless tobacco: Never Used  Substance and Sexual Activity  . Alcohol use: Yes    Alcohol/week: 0.0 standard drinks    Comment: rare  . Drug use: No  . Sexual activity: Not Currently  Lifestyle  . Physical activity    Days per week: 7 days    Minutes per session: 60 min  . Stress: Very much  Relationships  . Social Herbalist on phone: Three times a week    Gets together: Not on file    Attends religious service: Not on file    Active member of club or organization: Not on file    Attends meetings of clubs or organizations: Not on file    Relationship status: Not on file  Other Topics Concern  . Not on file  Social History Narrative  . Not on file     Family History: The patient's family history includes Hypertension in her mother and another family member; Schizophrenia in her brother.  ROS:   Please see the history of present illness.     All other systems reviewed and are negative.  EKGs/Labs/Other Studies Reviewed:    The following studies were reviewed today: Coronary CTA  EKG:  EKG is  ordered today.  The ekg ordered today demonstrates NSR, ST, LBBB, LAD  Recent Labs: 10/19/2018: BNP 4.8 06/04/2019: ALT 11 06/09/2019: Magnesium 2.0 06/14/2019: BUN 12; Creatinine, Ser 0.76; Hemoglobin 9.8; Platelets 374; Potassium 3.7; Sodium 137  Recent Lipid Panel    Component Value Date/Time   CHOL 154 05/13/2019 1358   TRIG 191 (H) 05/13/2019 1358   HDL 34 (L) 05/13/2019 1358   CHOLHDL 4.5 (H) 05/13/2019 1358   CHOLHDL 6.1 10/26/2013 0949   VLDL  36 10/26/2013 0949   LDLCALC 82 05/13/2019 1358    Physical Exam:    VS:  BP 90/68   Pulse 100   Temp (!) 96.1 F (35.6 C)   Ht 5\' 2"  (1.575 m)   Wt 229 lb (103.9 kg)   LMP 09/01/2013   BMI 41.88 kg/m     Wt Readings from Last 3 Encounters:  06/23/19  229 lb (103.9 kg)  06/22/19 229 lb (103.9 kg)  06/14/19 231 lb 6.4 oz (105 kg)     GEN: obese AA female well developed in no acute distress HEENT: Normal NECK: No JVD; No carotid bruits LYMPHATICS: No lymphadenopathy CARDIAC: RRR, no murmurs, rubs, gallops, increased rate RESPIRATORY:  Clear to auscultation without rales, wheezing or rhonchi, Rt chest surgical wound intact ABDOMEN: obese, Soft,  non-distended MUSCULOSKELETAL:  No edema; No deformity  SKIN: Warm and dry NEUROLOGIC:  Alert and oriented x 3 PSYCHIATRIC:  Normal affect   ASSESSMENT:    Atrial myxoma Left atrial myxoma- minimally invasive surgery 06/08/2019  Chest pain History of chest pain- coronary CTA minor CAD, Ca++ score 26  Left bundle branch block New Feb 2020  Morbid obesity ([place]) BMI 41  DJD (degenerative joint disease), lumbar L-spine surgery June 2020- Dr Arnoldo Morale  Non-insulin treated type 2 diabetes mellitus ([place]) On Glucophage  CAD (coronary artery disease) Minimal CAD with Ca++ score 26 by CTA  HTN (hypertension) She is hypotensive today  PLAN:    Check labs- CBC, BMP, TSH.  Hold Lisinopril for now, hold HCTZ today and decrease dose to 12.5 mg daily starting tomorrow.  F/U 3-4 weeks.    Medication Adjustments/Labs and Tests Ordered: Current medicines are reviewed at length with the patient today.  Concerns regarding medicines are outlined above.  No orders of the defined types were placed in this encounter.  No orders of the defined types were placed in this encounter.   There are no Patient Instructions on file for this visit.   Signed, Kerin Ransom, PA-C  06/23/2019 9:45 AM    [facility]

## 2019-06-23 NOTE — Patient Instructions (Addendum)
Medication Instructions:  STOP Lisinopril HOLD Hydrochlorothiazide FOR TODAY STARTING TOMORROW Take Hydrochlorothiazide (12.5mg ) half tablet once a day( break 25mg  tablet in half) If you need a refill on your cardiac medications before your next appointment, please call your pharmacy.   Lab work: Your physician recommends that you return for lab work in: [facility], BMET, TSH If you have labs (blood work) drawn today and your tests are completely normal, you will receive your results only by: Marland Kitchen MyChart Message (if you have MyChart) OR . A paper copy in the mail If you have any lab test that is abnormal or we need to change your treatment, we will call you to review the results.  Testing/Procedures: NONE   Follow-Up: At [facility], you and your health needs are our priority.  As part of our continuing mission to provide you with exceptional heart care, we have created designated Provider Care Teams.  These Care Teams include your primary Cardiologist (physician) and Advanced Practice Providers (APPs -  Physician Assistants and Nurse Practitioners) who all work together to provide you with the care you need, when you need it.  . Your physician recommends that you schedule a follow-up appointment in: [place], PA-C  Any Other Special Instructions Will Be Listed Below (If Applicable).

## 2019-06-23 NOTE — Assessment & Plan Note (Signed)
History of chest pain- coronary CTA minor CAD, Ca++ score 26

## 2019-06-23 NOTE — Assessment & Plan Note (Signed)
On Glucophage 

## 2019-06-23 NOTE — Assessment & Plan Note (Signed)
L-spine surgery June 2020- Dr Arnoldo Morale

## 2019-06-23 NOTE — Assessment & Plan Note (Signed)
New Feb 2020

## 2019-06-23 NOTE — Assessment & Plan Note (Signed)
BMI 41 

## 2019-06-23 NOTE — Assessment & Plan Note (Signed)
Left atrial myxoma- minimally invasive surgery 06/08/2019

## 2019-06-23 NOTE — Assessment & Plan Note (Signed)
Minimal CAD with Ca++ score 26 by CTA

## 2019-06-26 ENCOUNTER — Other Ambulatory Visit: Payer: Self-pay

## 2019-06-26 DIAGNOSIS — Y939 Activity, unspecified: Secondary | ICD-10-CM | POA: Diagnosis not present

## 2019-06-26 DIAGNOSIS — E876 Hypokalemia: Secondary | ICD-10-CM | POA: Diagnosis not present

## 2019-06-26 DIAGNOSIS — Y999 Unspecified external cause status: Secondary | ICD-10-CM | POA: Insufficient documentation

## 2019-06-26 DIAGNOSIS — Z7982 Long term (current) use of aspirin: Secondary | ICD-10-CM | POA: Insufficient documentation

## 2019-06-26 DIAGNOSIS — I10 Essential (primary) hypertension: Secondary | ICD-10-CM | POA: Diagnosis not present

## 2019-06-26 DIAGNOSIS — S3992XA Unspecified injury of lower back, initial encounter: Secondary | ICD-10-CM | POA: Diagnosis present

## 2019-06-26 DIAGNOSIS — X58XXXA Exposure to other specified factors, initial encounter: Secondary | ICD-10-CM | POA: Insufficient documentation

## 2019-06-26 DIAGNOSIS — I2510 Atherosclerotic heart disease of native coronary artery without angina pectoris: Secondary | ICD-10-CM | POA: Diagnosis not present

## 2019-06-26 DIAGNOSIS — Z79899 Other long term (current) drug therapy: Secondary | ICD-10-CM | POA: Diagnosis not present

## 2019-06-26 DIAGNOSIS — E119 Type 2 diabetes mellitus without complications: Secondary | ICD-10-CM | POA: Insufficient documentation

## 2019-06-26 DIAGNOSIS — Y929 Unspecified place or not applicable: Secondary | ICD-10-CM | POA: Diagnosis not present

## 2019-06-26 DIAGNOSIS — Z7984 Long term (current) use of oral hypoglycemic drugs: Secondary | ICD-10-CM | POA: Diagnosis not present

## 2019-06-26 DIAGNOSIS — S39012A Strain of muscle, fascia and tendon of lower back, initial encounter: Secondary | ICD-10-CM | POA: Diagnosis not present

## 2019-06-27 ENCOUNTER — Emergency Department (HOSPITAL_COMMUNITY)
Admission: EM | Admit: 2019-06-27 | Discharge: 2019-06-27 | Disposition: A | Discharge status: Home / Self Care | Payer: Medicare Other | Attending: Emergency Medicine | Admitting: Emergency Medicine

## 2019-06-27 ENCOUNTER — Encounter (HOSPITAL_COMMUNITY): Payer: Self-pay | Admitting: Emergency Medicine

## 2019-06-27 DIAGNOSIS — S39012A Strain of muscle, fascia and tendon of lower back, initial encounter: Secondary | ICD-10-CM | POA: Diagnosis not present

## 2019-06-27 LAB — CBC
HCT: 33 % — ABNORMAL LOW (ref 36.0–46.0)
Hemoglobin: 9.9 g/dL — ABNORMAL LOW (ref 12.0–15.0)
MCH: 23.9 pg — ABNORMAL LOW (ref 26.0–34.0)
MCHC: 30 g/dL (ref 30.0–36.0)
MCV: 79.5 fL — ABNORMAL LOW (ref 80.0–100.0)
Platelets: 461 10*3/uL — ABNORMAL HIGH (ref 150–400)
RBC: 4.15 MIL/uL (ref 3.87–5.11)
RDW: 14.5 % (ref 11.5–15.5)
WBC: 10.8 10*3/uL — ABNORMAL HIGH (ref 4.0–10.5)
nRBC: 0 % (ref 0.0–0.2)

## 2019-06-27 LAB — LIPASE, BLOOD: Lipase: 17 U/L (ref 11–51)

## 2019-06-27 LAB — COMPREHENSIVE METABOLIC PANEL
ALT: 13 U/L (ref 0–44)
AST: 12 U/L — ABNORMAL LOW (ref 15–41)
Albumin: 3.1 g/dL — ABNORMAL LOW (ref 3.5–5.0)
Alkaline Phosphatase: 84 U/L (ref 38–126)
Anion gap: 13 (ref 5–15)
BUN: 16 mg/dL (ref 6–20)
CO2: 22 mmol/L (ref 22–32)
Calcium: 8.9 mg/dL (ref 8.9–10.3)
Chloride: 105 mmol/L (ref 98–111)
Creatinine, Ser: 1.16 mg/dL — ABNORMAL HIGH (ref 0.44–1.00)
GFR calc Af Amer: >60 mL/min (ref >60)
GFR calc non Af Amer: 52 mL/min — ABNORMAL LOW (ref >60)
Glucose, Bld: 112 mg/dL — ABNORMAL HIGH (ref 70–99)
Potassium: 3.1 mmol/L — ABNORMAL LOW (ref 3.5–5.1)
Sodium: 140 mmol/L (ref 135–145)
Total Bilirubin: 0.4 mg/dL (ref 0.3–1.2)
Total Protein: 7.2 g/dL (ref 6.5–8.1)

## 2019-06-27 LAB — URINALYSIS, ROUTINE W REFLEX MICROSCOPIC
Bilirubin Urine: NEGATIVE
Glucose, UA: NEGATIVE mg/dL
Hgb urine dipstick: NEGATIVE
Ketones, ur: NEGATIVE mg/dL
Leukocytes,Ua: NEGATIVE
Nitrite: NEGATIVE
Protein, ur: NEGATIVE mg/dL
Specific Gravity, Urine: 1.026 (ref 1.005–1.030)
pH: 5 (ref 5.0–8.0)

## 2019-06-27 MED ORDER — KETOROLAC TROMETHAMINE 60 MG/2ML IM SOLN
30.0000 mg | Freq: Once | INTRAMUSCULAR | Status: AC
  Administered 2019-06-27: 06:00:00 30 mg via INTRAMUSCULAR
  Filled 2019-06-27: qty 2

## 2019-06-27 MED ORDER — SODIUM CHLORIDE 0.9% FLUSH: 3.0000 mL | Freq: Once | INTRAVENOUS | Status: DC

## 2019-06-27 NOTE — ED Provider Notes (Signed)
[place] EMERGENCY DEPARTMENT Provider Note  CSN: [id number] Arrival date & time: 06/26/19 2340  Chief Complaint(s) Back Pain  HPI Michele Wood is a 58 y.o. female with a past medical history listed below including chronic back pain, status post lumbar fusion 3 months ago who presents to the emergency department with 3 to 4 days of intermittent back pain felt only with moving and range of motion.  Pain radiates around to the abdomen.  Initially she believed it might be related to diarrhea that she is been having however the pain is only felt with movement.  Alleviated by immobility.  She denies any other radiation of the pain down to her legs.  No bladder/bowel incontinence.  No lower extremity weakness or loss of sensation.  No recent trauma or falls.  No fevers or infections.  No urinary symptoms.  No nausea or vomiting.  HPI  Past Medical History Past Medical History:  Diagnosis Date  . Anginal pain ([place])   . Chronic back pain   . DDD (degenerative disc disease), cervical   . Depression   . Diabetes mellitus without complication ([place])   . Dyspnea   . Hypertension    at age 41  . Obesity   . Pancreatic cyst 05/31/2019   Incidental - discovered on CT scan  . s/p minimally-invasive resection of left atrial myxoma 06/08/2019  . Sinusitis   . Sleep apnea    at age 53   Patient Active Problem List   Diagnosis Date Noted  . CAD (coronary artery disease) 06/23/2019  . S/P minimally-invasive resection of left atrial myxoma 06/08/2019  . Pancreatic cyst 05/31/2019  . Spondylolisthesis of lumbar region 03/31/2019  . Atrial myxoma 03/19/2019  . Hyperlipidemia 12/16/2018  . Family history of heart disease 12/16/2018  . Left bundle branch block 12/16/2018  . Chest pain 12/16/2018  . MDD (major depressive disorder), recurrent episode, moderate ([place]) 10/19/2018  . Non-insulin treated type 2 diabetes mellitus ([place]) 12/12/2017  . Morbid obesity ([place]) 09/12/2017   . Anxiety and depression 06/13/2017  . Cervical radiculopathy due to intervertebral disc disorder 06/13/2017  . Chronic low back pain without sciatica 09/18/2015  . DJD (degenerative joint disease), lumbar 06/08/2015  . Vitamin D insufficiency 04/25/2014  . HTN (hypertension) 06/21/2008   Home Medication(s) Prior to Admission medications   Medication Sig Start Date End Date Taking? Authorizing Provider  aspirin 81 MG EC tablet TAKE 1 TABLET BY MOUTH EVERY DAY Patient taking differently: Take 81 mg by mouth daily.  05/10/19   Ladell Pier, MD  atorvastatin (LIPITOR) 40 MG tablet Take 1 tablet (40 mg total) by mouth daily. 01/08/19   Lorretta Harp, MD  Cholecalciferol (VITAMIN D3) 2000 UNITS TABS Take 2,000 Units by mouth daily. Patient taking differently: Take 2,000 Units by mouth 2 (two) times a week.  09/19/15   Funches, Adriana Mccallum, MD  DULoxetine (CYMBALTA) 60 MG capsule Take 1 capsule (60 mg total) by mouth daily. 05/24/19   Arfeen, Arlyce Harman, MD  hydrochlorothiazide (HYDRODIURIL) 12.5 MG tablet Take 1 tablet (12.5 mg total) by mouth daily. 06/23/19   Erlene Quan, PA-C  hydrOXYzine (VISTARIL) 25 MG capsule Take 1 capsule (25 mg total) by mouth at bedtime as needed for anxiety. 05/24/19   Arfeen, Arlyce Harman, MD  Lurasidone HCl 60 MG TABS Take 1 tablet (60 mg total) by mouth daily with breakfast. 05/24/19   Arfeen, Arlyce Harman, MD  metFORMIN (GLUCOPHAGE) 500 MG tablet Take 1 tablet (  500 mg total) by mouth daily with breakfast. 10/19/18   Ladell Pier, MD  methocarbamol (ROBAXIN) 750 MG tablet Take 1 tablet (750 mg total) by mouth every 6 (six) hours as needed for muscle spasms. 04/02/19   Newman Pies, MD  metoprolol tartrate (LOPRESSOR) 25 MG tablet Take 1 tablet (25 mg total) by mouth 2 (two) times daily. 06/14/19   Antony Odea, PA-C  montelukast (SINGULAIR) 10 MG tablet Take 1 tablet (10 mg total) by mouth at bedtime. Patient taking differently: Take 10 mg by mouth daily.  08/22/16    Funches, Adriana Mccallum, MD  triamcinolone (NASACORT AQ) 55 MCG/ACT AERO nasal inhaler Place 2 sprays into the nose daily. 08/22/16   Boykin Nearing, MD                                                                                                                                    Past Surgical History Past Surgical History:  Procedure Laterality Date  . ABDOMINAL HYSTERECTOMY    . BACK SURGERY    . BREAST BIOPSY Right 07/02/2006   [place] Core benign  . MINIMALLY INVASIVE EXCISION OF ATRIAL MYXOMA N/A 06/08/2019   Procedure: MINIMALLY INVASIVE RESECTION OF LEFT ATRIAL MYXOMA;  Surgeon: [name], MD;  Location: [facility];  Service: Open Heart Surgery;  Laterality: N/A;  . NASAL SINUS SURGERY    . TEE WITHOUT CARDIOVERSION N/A 05/17/2019   Procedure: TRANSESOPHAGEAL ECHOCARDIOGRAM (TEE);  Surgeon: Acie Fredrickson Wonda Cheng, MD;  Location: [facility];  Service: Cardiovascular;  Laterality: N/A;  . TEE WITHOUT CARDIOVERSION N/A 06/08/2019   Procedure: TRANSESOPHAGEAL ECHOCARDIOGRAM (TEE);  Surgeon: [name], MD;  Location: [facility];  Service: Open Heart Surgery;  Laterality: N/A;   Family History Family History  Problem Relation Age of Onset  . Hypertension Mother   . Hypertension Other   . Schizophrenia Brother     Social History Social History   Tobacco Use  . Smoking status: Never Smoker  . Smokeless tobacco: Never Used  Substance Use Topics  . Alcohol use: Yes    Alcohol/week: 0.0 standard drinks    Comment: rare  . Drug use: No   Allergies Codeine sulfate and Milk-related compounds  Review of Systems Review of Systems All other systems are reviewed and are negative for acute change except as noted in the HPI  Physical Exam Vital Signs  I have reviewed the triage vital signs BP 107/71   Pulse 91   Temp 98.3 F (36.8 C) (Oral)   Resp 14   Ht 5\' 2"  (1.575 m)   Wt 103.9 kg   LMP 09/01/2013   SpO2 99%   BMI 41.90 kg/m   Physical Exam Vitals signs reviewed.   Constitutional:      General: She is not in acute distress.    Appearance: She is well-developed. She is morbidly obese. She is not diaphoretic.  HENT:  Head: Normocephalic and atraumatic.     Right Ear: External ear normal.     Left Ear: External ear normal.     Nose: Nose normal.  Eyes:     General: No scleral icterus.    Conjunctiva/sclera: Conjunctivae normal.  Neck:     Musculoskeletal: Normal range of motion.     Trachea: Phonation normal.  Cardiovascular:     Rate and Rhythm: Normal rate and regular rhythm.  Pulmonary:     Effort: Pulmonary effort is normal. No respiratory distress.     Breath sounds: No stridor.  Abdominal:     General: There is no distension.     Tenderness: There is no abdominal tenderness.  Musculoskeletal: Normal range of motion.     Lumbar back: She exhibits tenderness and spasm. She exhibits no bony tenderness.       Back:  Neurological:     Mental Status: She is alert and oriented to person, place, and time.     Comments: Spine Exam: Strength: 5/5 throughout LE bilaterally Sensation: Intact to light touch in proximal and distal LE bilaterally Reflexes: 1+ quadriceps and achilles reflexes   Psychiatric:        Behavior: Behavior normal.     ED Results and Treatments Labs (all labs ordered are listed, but only abnormal results are displayed) Labs Reviewed  COMPREHENSIVE METABOLIC PANEL - Abnormal; Notable for the following components:      Result Value   Potassium 3.1 (*)    Glucose, Bld 112 (*)    Creatinine, Ser 1.16 (*)    Albumin 3.1 (*)    AST 12 (*)    GFR calc non Af Amer 52 (*)    All other components within normal limits  CBC - Abnormal; Notable for the following components:   WBC 10.8 (*)    Hemoglobin 9.9 (*)    HCT 33.0 (*)    MCV 79.5 (*)    MCH 23.9 (*)    Platelets 461 (*)    All other components within normal limits  LIPASE, BLOOD  URINALYSIS, ROUTINE W REFLEX MICROSCOPIC                                                                                                                          EKG  EKG Interpretation  Date/Time:    Ventricular Rate:    PR Interval:    QRS Duration:   QT Interval:    QTC Calculation:   R Axis:     Text Interpretation:        Radiology No results found.  Pertinent labs & imaging results that were available during my care of the patient were reviewed by me and considered in my medical decision making (see chart for details).  Medications Ordered in ED Medications  sodium chloride flush (NS) 0.9 % injection 3 mL (3 mLs Intravenous Not Given 06/27/19 0532)  ketorolac (TORADOL) injection 30 mg (30 mg Intramuscular Given 06/27/19 0534)  Procedures Procedures  (including critical care time)  Medical Decision Making / ED Course I have reviewed the nursing notes for this encounter and the patient's prior records (if available in EHR or on provided paperwork).   ILIYANA SWEENY was evaluated in Emergency Department on 06/27/2019 for the symptoms described in the history of present illness. She was evaluated in the context of the global COVID-19 pandemic, which necessitated consideration that the patient might be at risk for infection with the SARS-CoV-2 virus that causes COVID-19. Institutional protocols and algorithms that pertain to the evaluation of patients at risk for COVID-19 are in a state of rapid change based on information released by regulatory bodies including the CDC and federal and state organizations. These policies and algorithms were followed during the patient's care in the ED.  58 y.o. female presents with back pain in lumbar area for 3 DAYS without signs of radicular pain. No acute traumatic onset. No red flag symptoms of fever, weight loss, saddle anesthesia, weakness, fecal/urinary incontinence or urinary retention.    Suspect MSK etiology. No indication for imaging emergently.   Abdomen benign.  Labs with mild hypokalemia; improving renal function. UA negative. Otherwise reassuring.  Low suspicion for serious intra-abdominal inflammatory/infectious process.  Patient was recommended to take short course of scheduled NSAIDs and engage in early mobility as definitive treatment. Return precautions discussed for worsening or new concerning symptoms.        Final Clinical Impression(s) / ED Diagnoses Final diagnoses:  Strain of lumbar region, initial encounter    The patient appears reasonably screened and/or stabilized for discharge and I doubt any other medical condition or other [facility] requiring further screening, evaluation, or treatment in the ED at this time prior to discharge.  Disposition: Discharge  Condition: Good  I have discussed the results, Dx and Tx plan with the patient who expressed understanding and agree(s) with the plan. Discharge instructions discussed at great length. The patient was given strict return precautions who verbalized understanding of the instructions. No further questions at time of discharge.    ED Discharge Orders    None        Follow Up: Ladell Pier, MD [place] [address] [phone]  Schedule an appointment as soon as possible for a visit  As needed, If symptoms do not improve or  worsen      This chart was dictated using voice recognition software.  Despite best efforts to proofread,  errors can occur which can change the documentation meaning.   Fatima Blank, MD 06/27/19 [phone]

## 2019-06-27 NOTE — Discharge Instructions (Signed)
You may use over-the-counter Motrin (Ibuprofen), Acetaminophen (Tylenol), topical muscle creams such as SalonPas, Icy Hot, Bengay, etc. Please stretch, apply heat, and have massage therapy for additional assistance. ° °

## 2019-06-27 NOTE — ED Triage Notes (Signed)
Pt c/o abd pain +diarrhea, denies n/v.  Pt also c/o low back pain x 3 days

## 2019-06-28 ENCOUNTER — Ambulatory Visit (INDEPENDENT_AMBULATORY_CARE_PROVIDER_SITE_OTHER): Payer: Medicaid Other | Admitting: Psychology

## 2019-06-28 DIAGNOSIS — F3289 Other specified depressive episodes: Secondary | ICD-10-CM | POA: Diagnosis not present

## 2019-07-02 ENCOUNTER — Other Ambulatory Visit: Payer: Self-pay

## 2019-07-02 DIAGNOSIS — N289 Disorder of kidney and ureter, unspecified: Secondary | ICD-10-CM

## 2019-07-05 ENCOUNTER — Telehealth (HOSPITAL_COMMUNITY): Payer: Self-pay

## 2019-07-05 NOTE — Telephone Encounter (Signed)

## 2019-07-05 NOTE — Telephone Encounter (Signed)
Called and spoke with pt in regards to VCR, pt stated she did not have time today to get set up for the program. Schedule VCR appt, 07/06/2019 @ 140PM

## 2019-07-06 ENCOUNTER — Encounter (HOSPITAL_COMMUNITY)
Admission: RE | Admit: 2019-07-06 | Discharge: 2019-07-06 | Disposition: A | Discharge status: Home / Self Care | Payer: Self-pay | Source: Ambulatory Visit | Attending: Cardiovascular Disease | Admitting: Cardiovascular Disease

## 2019-07-06 ENCOUNTER — Other Ambulatory Visit: Payer: Self-pay

## 2019-07-16 ENCOUNTER — Ambulatory Visit (INDEPENDENT_AMBULATORY_CARE_PROVIDER_SITE_OTHER): Payer: Medicaid Other | Admitting: Psychology

## 2019-07-16 DIAGNOSIS — F3289 Other specified depressive episodes: Secondary | ICD-10-CM

## 2019-07-19 ENCOUNTER — Ambulatory Visit (INDEPENDENT_AMBULATORY_CARE_PROVIDER_SITE_OTHER): Payer: Medicare Other | Admitting: Cardiology

## 2019-07-19 ENCOUNTER — Encounter: Payer: Self-pay | Admitting: Cardiology

## 2019-07-19 ENCOUNTER — Other Ambulatory Visit: Payer: Self-pay

## 2019-07-19 VITALS — BP 104/72 | HR 93 | Temp 97.0°F | Ht 62.0 in | Wt 232.0 lb

## 2019-07-19 DIAGNOSIS — I952 Hypotension due to drugs: Secondary | ICD-10-CM

## 2019-07-19 DIAGNOSIS — Z86018 Personal history of other benign neoplasm: Secondary | ICD-10-CM | POA: Diagnosis not present

## 2019-07-19 DIAGNOSIS — I447 Left bundle-branch block, unspecified: Secondary | ICD-10-CM

## 2019-07-19 DIAGNOSIS — E785 Hyperlipidemia, unspecified: Secondary | ICD-10-CM

## 2019-07-19 DIAGNOSIS — I2510 Atherosclerotic heart disease of native coronary artery without angina pectoris: Secondary | ICD-10-CM | POA: Diagnosis not present

## 2019-07-19 DIAGNOSIS — I209 Angina pectoris, unspecified: Secondary | ICD-10-CM | POA: Diagnosis not present

## 2019-07-19 DIAGNOSIS — I959 Hypotension, unspecified: Secondary | ICD-10-CM | POA: Insufficient documentation

## 2019-07-19 DIAGNOSIS — Z87898 Personal history of other specified conditions: Secondary | ICD-10-CM | POA: Diagnosis not present

## 2019-07-19 DIAGNOSIS — E119 Type 2 diabetes mellitus without complications: Secondary | ICD-10-CM

## 2019-07-19 DIAGNOSIS — Z8249 Family history of ischemic heart disease and other diseases of the circulatory system: Secondary | ICD-10-CM | POA: Diagnosis not present

## 2019-07-19 DIAGNOSIS — M47816 Spondylosis without myelopathy or radiculopathy, lumbar region: Secondary | ICD-10-CM | POA: Diagnosis not present

## 2019-07-19 NOTE — Assessment & Plan Note (Signed)
New Feb 2020

## 2019-07-19 NOTE — Assessment & Plan Note (Signed)
LDL 82 July 2020

## 2019-07-19 NOTE — Assessment & Plan Note (Signed)
Minimal CAD with Ca++ score 26 by CTA

## 2019-07-19 NOTE — Assessment & Plan Note (Signed)
History of chest pain and new LBBB Feb 2020- coronary CTA minor CAD, Ca++ score 26 Echo showed an EF of 45-50% with LA myxoma

## 2019-07-19 NOTE — Assessment & Plan Note (Signed)
She had been taking Amlodipine 10 mg which we were unaware of.  This was supposed to have been stopped at discharge in August.  I instructed her to stop this now and f/u with her PCP in a few weeks.

## 2019-07-19 NOTE — Assessment & Plan Note (Signed)
L-spine surgery June 2020- Dr Arnoldo Morale

## 2019-07-19 NOTE — Assessment & Plan Note (Signed)
Family history of heart disease-brother had a stent placed

## 2019-07-19 NOTE — Patient Instructions (Signed)
Medication Instructions:  STOP Norvasc If you need a refill on your cardiac medications before your next appointment, please call your pharmacy.   Lab work: None  If you have labs (blood work) drawn today and your tests are completely normal, you will receive your results only by: Marland Kitchen MyChart Message (if you have MyChart) OR . A paper copy in the mail If you have any lab test that is abnormal or we need to change your treatment, we will call you to review the results.  Testing/Procedures: None   Follow-Up: At [facility], you and your health needs are our priority.  As part of our continuing mission to provide you with exceptional heart care, we have created designated Provider Care Teams.  These Care Teams include your primary Cardiologist (physician) and Advanced Practice Providers (APPs -  Physician Assistants and Nurse Practitioners) who all work together to provide you with the care you need, when you need it. You will need a follow up appointment in 12 months.  Please call our office 2 months in advance to schedule this appointment.  You may see Quay Burow, MD or one of the following Advanced Practice Providers on your designated Care Team:   Kerin Ransom, PA-C Roby Lofts, [place] . Sande Rives, PA-C  Any Other Special Instructions Will Be Listed Below (If Applicable). PLEASE CONTACT DR Durenda Age OFFICE AND SCHEDULE A FOLLOW UP FOR YOUR BLOOD PRESSURE

## 2019-07-19 NOTE — Assessment & Plan Note (Signed)
Followed by PCP

## 2019-07-19 NOTE — Assessment & Plan Note (Signed)
Surgery 06/08/2019- Dr Roxy Manns

## 2019-07-19 NOTE — Progress Notes (Signed)
Cardiology Office Note:    Date:  07/19/2019   ID:  Michele Wood, DOB May 08, 1961, MRN [id number]  PCP:  Michele Pier, MD  Cardiologist:  Quay Burow, MD  Electrophysiologist:  None   Referring MD: Michele Pier, MD   Chief Complaint  Patient presents with  . Follow-up  From OV 06/23/2019  History of Present Illness:    Michele Wood is a 58 y.o. female with a hx of Lt atrial myxoma, s/p minimally invasive surgery 06/08/2019.   She was seen in the ED in Feb 2020 and noted to have a new LBBB c/w prior EKGs.  She does have a family history of CAD- her brother had a stent placed.  The patient was referred to Dr Gwenlyn Found who saw her in March 2020. He ordered a coronary CT and echo.  CT revealed minor CAD with a Ca++ score of 26.  Echo showed an EF of 45-50% with Lt atrial myxoma.  She was cleared to have back surgery which was done by Dr Arnoldo Morale in June 2020.  She was evaluated by Dr Roxy Manns as an OP and on 06/08/2019 underwent minimally invasive Lt atrial myxoma resection.  She tolerated this well.   I saw her in the office 06/23/2019 as a post hospital follow up.  She was c/o generalized weakness.  Her B/P was low-in the [id number] systolic.  I stopped her ACE and decreased her HCTZ.  She is in the office today for follow up.  In reviewing her medication today it became clear that she has also been taking Amlodipine 10 mg daily which we weren't aware of.  She had been instructed to stop this when she was discharged 06/14/2019 but apparently this never happened.  Since I saw her last she has been stable, she has not had syncope or near syncope.  She was in the ED with recurrent back pain 06/27/2019.  She is scheduled to see Dr Arnoldo Morale for this.    Past Medical History:  Diagnosis Date  . Anginal pain ([place])   . Chronic back pain   . DDD (degenerative disc disease), cervical   . Depression   . Diabetes mellitus without complication ([place])   . Dyspnea   . Hypertension    at age 31  .  Obesity   . Pancreatic cyst 05/31/2019   Incidental - discovered on CT scan  . s/p minimally-invasive resection of left atrial myxoma 06/08/2019  . Sinusitis   . Sleep apnea    at age 13    Past Surgical History:  Procedure Laterality Date  . ABDOMINAL HYSTERECTOMY    . BACK SURGERY    . BREAST BIOPSY Right 07/02/2006   [place] Core benign  . MINIMALLY INVASIVE EXCISION OF ATRIAL MYXOMA N/A 06/08/2019   Procedure: MINIMALLY INVASIVE RESECTION OF LEFT ATRIAL MYXOMA;  Surgeon: [name], MD;  Location: [facility];  Service: Open Heart Surgery;  Laterality: N/A;  . NASAL SINUS SURGERY    . TEE WITHOUT CARDIOVERSION N/A 05/17/2019   Procedure: TRANSESOPHAGEAL ECHOCARDIOGRAM (TEE);  Surgeon: Acie Fredrickson Wonda Cheng, MD;  Location: [facility];  Service: Cardiovascular;  Laterality: N/A;  . TEE WITHOUT CARDIOVERSION N/A 06/08/2019   Procedure: TRANSESOPHAGEAL ECHOCARDIOGRAM (TEE);  Surgeon: [name], MD;  Location: [facility];  Service: Open Heart Surgery;  Laterality: N/A;    Current Medications: Current Meds  Medication Sig  . aspirin 81 MG EC tablet TAKE 1 TABLET BY MOUTH EVERY DAY (Patient taking differently: Take  81 mg by mouth daily. )  . atorvastatin (LIPITOR) 40 MG tablet Take 1 tablet (40 mg total) by mouth daily.  . Cholecalciferol (VITAMIN D3) 2000 UNITS TABS Take 2,000 Units by mouth daily. (Patient taking differently: Take 2,000 Units by mouth 2 (two) times a week. )  . DULoxetine (CYMBALTA) 60 MG capsule Take 1 capsule (60 mg total) by mouth daily.  . hydrochlorothiazide (HYDRODIURIL) 12.5 MG tablet Take 1 tablet (12.5 mg total) by mouth daily.  . hydrOXYzine (VISTARIL) 25 MG capsule Take 1 capsule (25 mg total) by mouth at bedtime as needed for anxiety.  . Lurasidone HCl 60 MG TABS Take 1 tablet (60 mg total) by mouth daily with breakfast.  . metFORMIN (GLUCOPHAGE) 500 MG tablet Take 1 tablet (500 mg total) by mouth daily with breakfast.  . methocarbamol (ROBAXIN) 750 MG tablet Take 1  tablet (750 mg total) by mouth every 6 (six) hours as needed for muscle spasms.  . metoprolol tartrate (LOPRESSOR) 25 MG tablet Take 1 tablet (25 mg total) by mouth 2 (two) times daily.  . montelukast (SINGULAIR) 10 MG tablet Take 1 tablet (10 mg total) by mouth at bedtime. (Patient taking differently: Take 10 mg by mouth daily. )  . triamcinolone (NASACORT AQ) 55 MCG/ACT AERO nasal inhaler Place 2 sprays into the nose daily.     Allergies:   Codeine sulfate and Milk-related compounds   Social History   Socioeconomic History  . Marital status: Married    Spouse name: Jenny Reichmann  . Number of children: 2  . Years of education: Not on file  . Highest education level: Some college, no degree  Occupational History  . Not on file  Social Needs  . Financial resource strain: Very hard  . Food insecurity    Worry: Often true    Inability: Often true  . Transportation needs    Medical: No    Non-medical: No  Tobacco Use  . Smoking status: Never Smoker  . Smokeless tobacco: Never Used  Substance and Sexual Activity  . Alcohol use: Yes    Alcohol/week: 0.0 standard drinks    Comment: rare  . Drug use: No  . Sexual activity: Not Currently  Lifestyle  . Physical activity    Days per week: 7 days    Minutes per session: 60 min  . Stress: Very much  Relationships  . Social Herbalist on phone: Three times a week    Gets together: Not on file    Attends religious service: Not on file    Active member of club or organization: Not on file    Attends meetings of clubs or organizations: Not on file    Relationship status: Not on file  Other Topics Concern  . Not on file  Social History Narrative  . Not on file     Family History: The patient's family history includes Hypertension in her mother and another family member; Schizophrenia in her brother.  ROS:   Please see the history of present illness.     All other systems reviewed and are negative.  EKGs/Labs/Other Studies  Reviewed:     Recent Labs: 10/19/2018: BNP 4.8 06/09/2019: Magnesium 2.0 06/23/2019: TSH 2.200 06/27/2019: ALT 13; BUN 16; Creatinine, Ser 1.16; Hemoglobin 9.9; Platelets 461; Potassium 3.1; Sodium 140  Recent Lipid Panel    Component Value Date/Time   CHOL 154 05/13/2019 1358   TRIG 191 (H) 05/13/2019 1358   HDL 34 (L) 05/13/2019 1358  CHOLHDL 4.5 (H) 05/13/2019 1358   CHOLHDL 6.1 10/26/2013 0949   VLDL 36 10/26/2013 0949   LDLCALC 82 05/13/2019 1358    Physical Exam:    VS:  BP 104/72   Pulse 93   Temp (!) 97 F (36.1 C) (Temporal)   Ht 5\' 2"  (1.575 m)   Wt 232 lb (105.2 kg)   LMP 09/01/2013   SpO2 99%   BMI 42.43 kg/m     Wt Readings from Last 3 Encounters:  07/19/19 232 lb (105.2 kg)  06/27/19 229 lb 0.9 oz (103.9 kg)  06/23/19 229 lb (103.9 kg)     GEN: Morbidly obese AA female,  in no acute distress HEENT: Normal NECK: No JVD; No carotid bruits LYMPHATICS: No lymphadenopathy CARDIAC: RRR, no murmurs, rubs, gallops RESPIRATORY:  Clear to auscultation without rales, wheezing or rhonchi  MUSCULOSKELETAL:  No edema; No deformity  SKIN: Warm and dry NEUROLOGIC:  Alert and oriented x 3 PSYCHIATRIC:  Normal affect   ASSESSMENT:    Hypotension She had been taking Amlodipine 10 mg which we were unaware of.  This was supposed to have been stopped at discharge in August.  I instructed her to stop this now and f/u with her PCP in a few weeks.   S/P minimally-invasive resection of left atrial myxoma Surgery 06/08/2019- Dr Roxy Manns  Non-insulin treated type 2 diabetes mellitus ([place]) Followed by PCP  History of chest pain History of chest pain and new LBBB Feb 2020- coronary CTA minor CAD, Ca++ score 26 Echo showed an EF of 45-50% with LA myxoma  Family history of heart disease Family history of heart disease-brother had a stent placed  Morbid obesity ([place]) BMI 41-large breast- poor Myoview candidate  Left bundle branch block New Feb 2020  DJD (degenerative  joint disease), lumbar L-spine surgery June 2020- Dr Arnoldo Morale  CAD (coronary artery disease) Minimal CAD with Ca++ score 26 by CTA  Dyslipidemia, goal LDL below 70 LDL 82 July 2020  PLAN:    Stop Amlodipine. If her B/P drifts up again she may be better served by an ARB but will defer further adjustments to he medications to her PCP.  F/U with cardiology in one year.    Medication Adjustments/Labs and Tests Ordered: Current medicines are reviewed at length with the patient today.  Concerns regarding medicines are outlined above.  No orders of the defined types were placed in this encounter.  No orders of the defined types were placed in this encounter.   Patient Instructions  Medication Instructions:  STOP Norvasc If you need a refill on your cardiac medications before your next appointment, please call your pharmacy.   Lab work: None  If you have labs (blood work) drawn today and your tests are completely normal, you will receive your results only by: Marland Kitchen MyChart Message (if you have MyChart) OR . A paper copy in the mail If you have any lab test that is abnormal or we need to change your treatment, we will call you to review the results.  Testing/Procedures: None   Follow-Up: At [facility], you and your health needs are our priority.  As part of our continuing mission to provide you with exceptional heart care, we have created designated Provider Care Teams.  These Care Teams include your primary Cardiologist (physician) and Advanced Practice Providers (APPs -  Physician Assistants and Nurse Practitioners) who all work together to provide you with the care you need, when you need it. You will need a  follow up appointment in 12 months.  Please call our office 2 months in advance to schedule this appointment.  You may see Quay Burow, MD or one of the following Advanced Practice Providers on your designated Care Team:   Kerin Ransom, PA-C Roby Lofts, [place] . Sande Rives, PA-C  Any Other Special Instructions Will Be Listed Below (If Applicable). PLEASE CONTACT DR Durenda Age OFFICE AND SCHEDULE A FOLLOW UP FOR YOUR BLOOD PRESSURE      Signed, Kerin Ransom, PA-C  07/19/2019 1:58 PM    [facility]

## 2019-07-19 NOTE — Assessment & Plan Note (Signed)
BMI 41-large breast- poor Myoview candidate

## 2019-07-20 ENCOUNTER — Other Ambulatory Visit: Payer: Self-pay | Admitting: Cardiology

## 2019-07-30 DIAGNOSIS — M4316 Spondylolisthesis, lumbar region: Secondary | ICD-10-CM | POA: Diagnosis not present

## 2019-08-06 ENCOUNTER — Ambulatory Visit (INDEPENDENT_AMBULATORY_CARE_PROVIDER_SITE_OTHER): Payer: Medicaid Other | Admitting: Psychology

## 2019-08-06 DIAGNOSIS — F3289 Other specified depressive episodes: Secondary | ICD-10-CM

## 2019-08-09 ENCOUNTER — Other Ambulatory Visit: Payer: Self-pay

## 2019-08-09 ENCOUNTER — Encounter: Payer: Self-pay | Admitting: Podiatry

## 2019-08-09 ENCOUNTER — Ambulatory Visit (INDEPENDENT_AMBULATORY_CARE_PROVIDER_SITE_OTHER): Payer: Medicare Other | Admitting: Podiatry

## 2019-08-09 DIAGNOSIS — B351 Tinea unguium: Secondary | ICD-10-CM | POA: Diagnosis not present

## 2019-08-09 DIAGNOSIS — M79674 Pain in right toe(s): Secondary | ICD-10-CM

## 2019-08-09 DIAGNOSIS — E119 Type 2 diabetes mellitus without complications: Secondary | ICD-10-CM

## 2019-08-09 DIAGNOSIS — M79675 Pain in left toe(s): Secondary | ICD-10-CM

## 2019-08-09 DIAGNOSIS — L84 Corns and callosities: Secondary | ICD-10-CM

## 2019-08-09 NOTE — Patient Instructions (Signed)
Diabetes Mellitus and Foot Care Foot care is an important part of your health, especially when you have diabetes. Diabetes may cause you to have problems because of poor blood flow (circulation) to your feet and legs, which can cause your skin to:  Become thinner and drier.  Break more easily.  Heal more slowly.  Peel and crack. You may also have nerve damage (neuropathy) in your legs and feet, causing decreased feeling in them. This means that you may not notice minor injuries to your feet that could lead to more serious problems. Noticing and addressing any potential problems early is the best way to prevent future foot problems. How to care for your feet Foot hygiene  Wash your feet daily with warm water and mild soap. Do not use hot water. Then, pat your feet and the areas between your toes until they are completely dry. Do not soak your feet as this can dry your skin.  Trim your toenails straight across. Do not dig under them or around the cuticle. File the edges of your nails with an emery board or nail file.  Apply a moisturizing lotion or petroleum jelly to the skin on your feet and to dry, brittle toenails. Use lotion that does not contain alcohol and is unscented. Do not apply lotion between your toes. Shoes and socks  Wear clean socks or stockings every day. Make sure they are not too tight. Do not wear knee-high stockings since they may decrease blood flow to your legs.  Wear shoes that fit properly and have enough cushioning. Always look in your shoes before you put them on to be sure there are no objects inside.  To break in new shoes, wear them for just a few hours a day. This prevents injuries on your feet. Wounds, scrapes, corns, and calluses  Check your feet daily for blisters, cuts, bruises, sores, and redness. If you cannot see the bottom of your feet, use a mirror or ask someone for help.  Do not cut corns or calluses or try to remove them with medicine.  If you  find a minor scrape, cut, or break in the skin on your feet, keep it and the skin around it clean and dry. You may clean these areas with mild soap and water. Do not clean the area with peroxide, alcohol, or iodine.  If you have a wound, scrape, corn, or callus on your foot, look at it several times a day to make sure it is healing and not infected. Check for: ? Redness, swelling, or pain. ? Fluid or blood. ? Warmth. ? Pus or a bad smell. General instructions  Do not cross your legs. This may decrease blood flow to your feet.  Do not use heating pads or hot water bottles on your feet. They may burn your skin. If you have lost feeling in your feet or legs, you may not know this is happening until it is too late.  Protect your feet from hot and cold by wearing shoes, such as at the beach or on hot pavement.  Schedule a complete foot exam at least once a year (annually) or more often if you have foot problems. If you have foot problems, report any cuts, sores, or bruises to your health care provider immediately. Contact a health care provider if:  You have a medical condition that increases your risk of infection and you have any cuts, sores, or bruises on your feet.  You have an injury that is not   healing.  You have redness on your legs or feet.  You feel burning or tingling in your legs or feet.  You have pain or cramps in your legs and feet.  Your legs or feet are numb.  Your feet always feel cold.  You have pain around a toenail. Get help right away if:  You have a wound, scrape, corn, or callus on your foot and: ? You have pain, swelling, or redness that gets worse. ? You have fluid or blood coming from the wound, scrape, corn, or callus. ? Your wound, scrape, corn, or callus feels warm to the touch. ? You have pus or a bad smell coming from the wound, scrape, corn, or callus. ? You have a fever. ? You have a red line going up your leg. Summary  Check your feet every day  for cuts, sores, red spots, swelling, and blisters.  Moisturize feet and legs daily.  Wear shoes that fit properly and have enough cushioning.  If you have foot problems, report any cuts, sores, or bruises to your health care provider immediately.  Schedule a complete foot exam at least once a year (annually) or more often if you have foot problems. This information is not intended to replace advice given to you by your health care provider. Make sure you discuss any questions you have with your health care provider. Document Released: 09/27/2000 Document Revised: 11/12/2017 Document Reviewed: 11/01/2016 Elsevier Patient Education  2020 Elsevier Inc[employer]  

## 2019-08-11 NOTE — Progress Notes (Signed)
Subjective: Michele Wood is seen today for preventative diabetic foot care follow up painful calluses and elongated, thickened toenails 1-5 b/l feet that she cannot cut. Pain interferes with daily activities. Aggravating factor includes wearing enclosed shoe gear and relieved with periodic debridement.  She states she has had open heart surgery since her last visit with [place].  She is recovering well.  She voices no new pedal problems on today's visit.   Current Outpatient Medications on File Prior to Visit  Medication Sig  . amLODipine (NORVASC) 10 MG tablet   . aspirin 81 MG EC tablet TAKE 1 TABLET BY MOUTH EVERY DAY (Patient taking differently: Take 81 mg by mouth daily. )  . atorvastatin (LIPITOR) 40 MG tablet Take 1 tablet (40 mg total) by mouth daily.  . Cholecalciferol (VITAMIN D3) 2000 UNITS TABS Take 2,000 Units by mouth daily. (Patient taking differently: Take 2,000 Units by mouth 2 (two) times a week. )  . DULoxetine (CYMBALTA) 60 MG capsule Take 1 capsule (60 mg total) by mouth daily.  . hydrochlorothiazide (HYDRODIURIL) 12.5 MG tablet TAKE 1 TABLET BY MOUTH EVERY DAY  . hydrOXYzine (VISTARIL) 25 MG capsule Take 1 capsule (25 mg total) by mouth at bedtime as needed for anxiety.  . Lurasidone HCl 60 MG TABS Take 1 tablet (60 mg total) by mouth daily with breakfast.  . metFORMIN (GLUCOPHAGE) 500 MG tablet Take 1 tablet (500 mg total) by mouth daily with breakfast.  . methocarbamol (ROBAXIN) 750 MG tablet Take 1 tablet (750 mg total) by mouth every 6 (six) hours as needed for muscle spasms.  . metoprolol tartrate (LOPRESSOR) 25 MG tablet Take 1 tablet (25 mg total) by mouth 2 (two) times daily.  . montelukast (SINGULAIR) 10 MG tablet Take 1 tablet (10 mg total) by mouth at bedtime. (Patient taking differently: Take 10 mg by mouth daily. )  . traMADol (ULTRAM) 50 MG tablet   . triamcinolone (NASACORT AQ) 55 MCG/ACT AERO nasal inhaler Place 2 sprays into the nose daily.   No current  facility-administered medications on file prior to visit.      Allergies  Allergen Reactions  . Codeine Sulfate Rash  . Milk-Related Compounds Diarrhea and Other (See Comments)    Diarrhea and stomach upset     Objective:  Vascular Examination: Capillary refill time immediate x 10 digits.  Dorsalis pedis present b/l.  Posterior tibial pulses present b/l.  Digital hair sparse b/l.   Skin temperature gradient WNL b/l.   Dermatological Examination: Skin with normal turgor, texture and tone b/l.  Toenails 1-5 b/l discolored, thick, dystrophic with subungual debris and pain with palpation to nailbeds due to thickness of nails.  Hyperkeratotic lesions b/l hallux with tenderness to palpation. No edema, no erythema, no drainage, no flocculence.  Musculoskeletal: Muscle strength 5/5 to all LE muscle groups b/l.  No gross bony deformities b/l.  No pain, crepitus or joint limitation noted with ROM.   Neurological Examination: Protective sensation intact 5/5 b/l with 10 gram monofilament bilaterally.  Epicritic sensation present bilaterally.  Vibratory sensation intact bilaterally.   Assessment: Painful onychomycosis toenails 1-5 b/l  Calluses b/l hallux NIDDM  Plan: 1. Toenails 1-5 b/l were debrided in length and girth without iatrogenic bleeding. 2. Calluses pared b/l halluces utilizing sterile scalpel blade without incident. 3. Patient to continue soft, supportive shoe gear. 4. Patient to report any pedal injuries to medical professional immediately. 5. Follow up 3 months.  6. Patient/POA to call should there be a concern  in the interim.

## 2019-08-17 ENCOUNTER — Telehealth (HOSPITAL_COMMUNITY): Payer: Self-pay

## 2019-08-17 NOTE — Telephone Encounter (Signed)
Phone call to Pt to inquire about inactivity in virtual CR app. Pt did not answer and no voicemail is set up to leave a message. Message was sent in the virtual CR app for Pt to return call. If Pt does not log exercise on by 08/20/19 she will be terminated form virtual CR.

## 2019-08-21 ENCOUNTER — Other Ambulatory Visit (HOSPITAL_COMMUNITY): Payer: Self-pay | Admitting: Psychiatry

## 2019-08-21 DIAGNOSIS — F4310 Post-traumatic stress disorder, unspecified: Secondary | ICD-10-CM

## 2019-08-21 DIAGNOSIS — F331 Major depressive disorder, recurrent, moderate: Secondary | ICD-10-CM

## 2019-08-23 ENCOUNTER — Encounter (HOSPITAL_COMMUNITY): Payer: Self-pay | Admitting: Psychiatry

## 2019-08-23 ENCOUNTER — Ambulatory Visit (INDEPENDENT_AMBULATORY_CARE_PROVIDER_SITE_OTHER): Payer: Medicare Other | Admitting: Psychiatry

## 2019-08-23 ENCOUNTER — Other Ambulatory Visit: Payer: Self-pay

## 2019-08-23 DIAGNOSIS — F4310 Post-traumatic stress disorder, unspecified: Secondary | ICD-10-CM | POA: Diagnosis not present

## 2019-08-23 DIAGNOSIS — F331 Major depressive disorder, recurrent, moderate: Secondary | ICD-10-CM | POA: Diagnosis not present

## 2019-08-23 DIAGNOSIS — I209 Angina pectoris, unspecified: Secondary | ICD-10-CM

## 2019-08-23 MED ORDER — LURASIDONE HCL 60 MG PO TABS: 60.0000 mg | ORAL_TABLET | Freq: Every day | ORAL | 2 refills | Status: DC

## 2019-08-23 MED ORDER — HYDROXYZINE PAMOATE 25 MG PO CAPS: 25.0000 mg | ORAL_CAPSULE | Freq: Every evening | ORAL | 2 refills | Status: DC | PRN

## 2019-08-23 MED ORDER — DULOXETINE HCL 60 MG PO CPEP: 60.0000 mg | ORAL_CAPSULE | Freq: Every day | ORAL | 2 refills | Status: DC

## 2019-08-23 NOTE — Progress Notes (Signed)
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   Hemoglobin 9.1 (L) 12.0 - 15.0 g/dL   HCT 29.7 (L) 36.0 - 46.0 %   MCV 78.8 (L) 80.0 - 100.0 fL   MCH 24.1 (L) 26.0 - 34.0 pg   MCHC 30.6 30.0 - 36.0 g/dL   RDW 14.1 11.5 - 15.5 %   Platelets 288 150 - 400 K/uL   nRBC 0.0 0.0 - 0.2 %    Comment: Performed at [facility], [place] [address], [place], [place] [address]  Glucose, capillary     Status: Abnormal   Collection Time: 06/11/19  8:15 AM  Result Value Ref Range    Glucose-Capillary 125 (H) 70 - 99 mg/dL  Glucose, capillary     Status: Abnormal   Collection Time: 06/11/19 12:06 PM  Result Value Ref Range   Glucose-Capillary 106 (H) 70 - 99 mg/dL  Glucose, capillary     Status: Abnormal   Collection Time: 06/11/19  6:01 PM  Result Value Ref Range   Glucose-Capillary 100 (H) 70 - 99 mg/dL   Comment 1 Document in Chart   Glucose, capillary     Status: None   Collection Time: 06/11/19 10:16 PM  Result Value Ref Range   Glucose-Capillary 94 70 - 99 mg/dL  Glucose, capillary     Status: None   Collection Time: 06/12/19  6:44 AM  Result Value Ref Range   Glucose-Capillary 92 70 - 99 mg/dL  CBC     Status: Abnormal   Collection Time: 06/14/19  3:14 AM  Result Value Ref Range   WBC 9.9 4.0 - 10.5 K/uL   RBC 4.03 3.87 - 5.11 MIL/uL   Hemoglobin 9.8 (L) 12.0 - 15.0 g/dL   HCT 31.7 (L) 36.0 - 46.0 %   MCV 78.7 (L) 80.0 - 100.0 fL   MCH 24.3 (L) 26.0 - 34.0 pg   MCHC 30.9 30.0 - 36.0 g/dL   RDW 14.4 11.5 - 15.5 %   Platelets 374 150 - 400 K/uL   nRBC 0.0 0.0 - 0.2 %    Comment: Performed at [facility], [place]. [address] [place], [place] [id number]  Basic metabolic panel     Status: Abnormal   Collection Time: 06/14/19  3:14 AM  Result Value Ref Range   Sodium 137 135 - 145 mmol/L   Potassium 3.7 3.5 - 5.1 mmol/L   Chloride 102 98 - 111 mmol/L   CO2 25 22 - 32 mmol/L   Glucose, Bld 99 70 - 99 mg/dL   BUN 12 6 - 20 mg/dL   Creatinine, Ser 0.76 0.44 - 1.00 mg/dL   Calcium 8.8 (L) 8.9 - 10.3 mg/dL   GFR calc non Af Amer >60 >60 mL/min   GFR calc Af Amer >60 >60 mL/min   Anion gap 10 5 - 15    Comment: Performed at [facility], [place] [address], [place], [place] [id number]  Basic Metabolic Panel (BMET)     Status: Abnormal   Collection Time: 06/23/19 10:07 AM  Result Value Ref Range   Glucose 104 (H) 65 - 99 mg/dL   BUN 19 6 - 24 mg/dL   Creatinine, Ser 1.38 (H) 0.57 - 1.00 mg/dL   GFR  calc non Af Amer 42 (L) >59 mL/min/1.73   GFR  calc Af Amer 49 (L) >59 mL/min/1.73   BUN/Creatinine Ratio 14 9 - 23   Sodium 140 134 - 144 mmol/L   Potassium 4.0 3.5 - 5.2 mmol/L   Chloride 100 96 - 106 mmol/L   CO2 24 20 - 29 mmol/L   Calcium 9.8 8.7 - 10.2 mg/dL  CBC with Differential     Status: Abnormal   Collection Time: 06/23/19 10:07 AM  Result Value Ref Range   WBC 7.1 3.4 - 10.8 x10E3/uL   RBC 3.99 3.77 - 5.28 x10E6/uL   Hemoglobin 10.0 (L) 11.1 - 15.9 g/dL   Hematocrit 31.0 (L) 34.0 - 46.6 %   MCV 78 (L) 79 - 97 fL   MCH 25.1 (L) 26.6 - 33.0 pg   MCHC 32.3 31.5 - 35.7 g/dL   RDW 14.4 11.7 - 15.4 %   Platelets 496 (H) 150 - 450 x10E3/uL   Neutrophils 53 Not Estab. %   Lymphs 36 Not Estab. %   Monocytes 7 Not Estab. %   Eos 3 Not Estab. %   Basos 1 Not Estab. %   Neutrophils Absolute 3.8 1.4 - 7.0 x10E3/uL   Lymphocytes Absolute 2.6 0.7 - 3.1 x10E3/uL   Monocytes Absolute 0.5 0.1 - 0.9 x10E3/uL   EOS (ABSOLUTE) 0.2 0.0 - 0.4 x10E3/uL   Basophils Absolute 0.1 0.0 - 0.2 x10E3/uL   Immature Granulocytes 0 Not Estab. %   Immature Grans (Abs) 0.0 0.0 - 0.1 x10E3/uL  TSH     Status: None   Collection Time: 06/23/19 10:07 AM  Result Value Ref Range   TSH 2.200 0.450 - 4.500 uIU/mL  Lipase, blood     Status: None   Collection Time: 06/27/19 12:40 AM  Result Value Ref Range   Lipase 17 11 - 51 U/L    Comment: Performed at [facility], [place] [address] [place], [place] [address]  Comprehensive metabolic panel     Status: Abnormal   Collection Time: 06/27/19 12:40 AM  Result Value Ref Range   Sodium 140 135 - 145 mmol/L   Potassium 3.1 (L) 3.5 - 5.1 mmol/L   Chloride 105 98 - 111 mmol/L   CO2 22 22 - 32 mmol/L   Glucose, Bld 112 (H) 70 - 99 mg/dL   BUN 16 6 - 20 mg/dL   Creatinine, Ser 1.16 (H) 0.44 - 1.00 mg/dL   Calcium 8.9 8.9 - 10.3 mg/dL   Total Protein 7.2 6.5 - 8.1 g/dL   Albumin 3.1 (L) 3.5 - 5.0 g/dL   AST 12 (L) 15 - 41 U/L   ALT 13 0 - 44 U/L   Alkaline Phosphatase 84 38 - 126 U/L   Total  Bilirubin 0.4 0.3 - 1.2 mg/dL   GFR calc non Af Amer 52 (L) >60 mL/min   GFR calc Af Amer >60 >60 mL/min   Anion gap 13 5 - 15    Comment: Performed at [facility], [place] [address], [place], [place] [address]  CBC     Status: Abnormal   Collection Time: 06/27/19 12:40 AM  Result Value Ref Range   WBC 10.8 (H) 4.0 - 10.5 K/uL   RBC 4.15 3.87 - 5.11 MIL/uL   Hemoglobin 9.9 (L) 12.0 - 15.0 g/dL   HCT 33.0 (L) 36.0 - 46.0 %   MCV 79.5 (L) 80.0 - 100.0 fL   MCH 23.9 (L) 26.0 - 34.0  pg   MCHC 30.0 30.0 - 36.0 g/dL   RDW 14.5 11.5 - 15.5 %   Platelets 461 (H) 150 - 400 K/uL   nRBC 0.0 0.0 - 0.2 %    Comment: Performed at [facility], [place] [address], [place], [place] [address]  Urinalysis, Routine w reflex microscopic     Status: None   Collection Time: 06/27/19  6:30 AM  Result Value Ref Range   Color, Urine YELLOW YELLOW   APPearance CLEAR CLEAR   Specific Gravity, Urine 1.026 1.005 - 1.030   pH 5.0 5.0 - 8.0   Glucose, UA NEGATIVE NEGATIVE mg/dL   Hgb urine dipstick NEGATIVE NEGATIVE   Bilirubin Urine NEGATIVE NEGATIVE   Ketones, ur NEGATIVE NEGATIVE mg/dL   Protein, ur NEGATIVE NEGATIVE mg/dL   Nitrite NEGATIVE NEGATIVE   Leukocytes,Ua NEGATIVE NEGATIVE    Comment: Performed at [facility] [address], [place], [place] [address]     Psychiatric Specialty Exam: Physical Exam  ROS  Last menstrual period 09/01/2013.There is no height or weight on file to calculate BMI.  General Appearance: NA  Eye Contact:  NA  Speech:  Clear and Coherent and Normal Rate  Volume:  Normal  Mood:  Euthymic  Affect:  NA  Thought Process:  Goal Directed  Orientation:  Full (Time, Place, and Person)  Thought Content:  WDL and Logical  Suicidal Thoughts:  No  Homicidal Thoughts:  No  Memory:  Immediate;   Good Recent;   Good Remote;   Good  Judgement:  Good  Insight:  Good  Psychomotor Activity:  NA  Concentration:  Concentration: Good and Attention Span: Good   Recall:  Good  Fund of Knowledge:  Good  Language:  Good  Akathisia:  No  Handed:  Right  AIMS (if indicated):     Assets:  Communication Skills Desire for Improvement Housing Resilience Social Support  ADL's:  Intact  Cognition:  WNL  Sleep:   ok      Assessment and Plan: Posttraumatic stress disorder.  Major depressive disorder, recurrent.  I reviewed her blood work results.  Her creatinine is 1.16 which is improved from past to 1.38.  Her potassium is 3.1.  Her glucose 112.  Her AST and ALT is normal.  Patient sleeping better since hydroxyzine 25 mg taking on a regular basis.  I discussed that if tremors continue to get worse and do not improve then she should contact primary care physician.  It is unlikely tremor caused by [place] because she is taking Latuda and Cymbalta for a while.  She noticed tremors after the surgery.  Discussed medication side effects and benefits.  Continue Latuda 60 mg daily, Cymbalta 60 mg daily and hydroxyzine 25 mg at bedtime.  Recommended to call us back if she has any question of any concern.  Follow-up in 3 months.  Follow Up Instructions:    I discussed the assessment and treatment plan with the patient. The patient was provided an opportunity to ask questions and all were answered. The patient agreed with the plan and demonstrated an understanding of the instructions.   The patient was advised to call back or seek an in-person evaluation if the symptoms worsen or if the condition fails to improve as anticipated.  I provided 20 minutes of non-face-to-face time during this encounter.   [name], MD

## 2019-08-24 ENCOUNTER — Encounter (HOSPITAL_COMMUNITY): Payer: Self-pay

## 2019-08-24 NOTE — Progress Notes (Signed)
Michele Wood has been discharged from [facility]. Pt has not been logging exercise in the app. Have tried to contact Pt by phone and messaging through [facility] with no response.

## 2019-08-27 ENCOUNTER — Ambulatory Visit (INDEPENDENT_AMBULATORY_CARE_PROVIDER_SITE_OTHER): Payer: Medicare Other | Admitting: Psychology

## 2019-08-27 DIAGNOSIS — F3289 Other specified depressive episodes: Secondary | ICD-10-CM | POA: Diagnosis not present

## 2019-08-27 DIAGNOSIS — Z23 Encounter for immunization: Secondary | ICD-10-CM | POA: Diagnosis not present

## 2019-08-30 ENCOUNTER — Ambulatory Visit: Payer: Self-pay | Admitting: Thoracic Surgery (Cardiothoracic Vascular Surgery)

## 2019-09-06 ENCOUNTER — Other Ambulatory Visit: Payer: Self-pay

## 2019-09-06 ENCOUNTER — Telehealth (INDEPENDENT_AMBULATORY_CARE_PROVIDER_SITE_OTHER): Payer: Self-pay | Admitting: Thoracic Surgery (Cardiothoracic Vascular Surgery)

## 2019-09-06 DIAGNOSIS — Z86018 Personal history of other benign neoplasm: Secondary | ICD-10-CM

## 2019-09-06 NOTE — Progress Notes (Signed)
[address]       [place] [address]             [phone]     CARDIOTHORACIC SURGERY TELEPHONE VIRTUAL OFFICE NOTE  Referring Provider is Nahser, Wonda Cheng, MD Primary Cardiologist is Quay Burow, MD PCP is Ladell Pier, MD   HPI:  I spoke with Michele Wood (DOB 1961/07/20 ) via telephone on 09/06/2019 at 2:53 PM and verified that I was speaking with the correct person using more than one form of identification.  We discussed the reason(s) for conducting our visit virtually instead of in-person.  The patient expressed understanding the circumstances and agreed to proceed as described.   Patient is a 58 year old morbidly obese female with history of hypertension, obstructive sleep apnea, degenerative disc disease of the cervical and lumbar spine status post recent spine surgery, and type 2 diabetes mellitus without complications who presented with atypical chest pain and was found to have left atrial mass.  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  Her postoperative recovery was uneventful and she was last seen here in our office on 06/22/2019 at which time she was doing well.  I spoke with her over the telephone today and she reports that she has essentially completely recovered from her surgery.  She no longer has any significant pain in her chest.  She has no trouble breathing.  She is back to normal activity.  Overall she feels well and reports no complaints.   Current Outpatient Medications  Medication Sig Dispense Refill  . amLODipine (NORVASC) 10 MG tablet     . aspirin 81 MG EC tablet TAKE 1 TABLET BY MOUTH EVERY DAY (Patient taking differently: Take 81 mg by mouth daily. ) 100 tablet 1  . atorvastatin (LIPITOR) 40 MG tablet Take 1 tablet (40 mg total) by mouth daily. 90 tablet 3  . Cholecalciferol (VITAMIN D3) 2000 UNITS TABS Take 2,000 Units by mouth daily. (Patient taking differently:  Take 2,000 Units by mouth 2 (two) times a week. ) 30 tablet 11  . DULoxetine (CYMBALTA) 60 MG capsule Take 1 capsule (60 mg total) by mouth daily. 30 capsule 2  . hydrochlorothiazide (HYDRODIURIL) 12.5 MG tablet TAKE 1 TABLET BY MOUTH EVERY DAY 30 tablet 3  . hydrOXYzine (VISTARIL) 25 MG capsule Take 1 capsule (25 mg total) by mouth at bedtime as needed for anxiety. 30 capsule 2  . Lurasidone HCl 60 MG TABS Take 1 tablet (60 mg total) by mouth daily with breakfast. 30 tablet 2  . metFORMIN (GLUCOPHAGE) 500 MG tablet Take 1 tablet (500 mg total) by mouth daily with breakfast. 90 tablet 3  . methocarbamol (ROBAXIN) 750 MG tablet Take 1 tablet (750 mg total) by mouth every 6 (six) hours as needed for muscle spasms. 50 tablet 1  . metoprolol tartrate (LOPRESSOR) 25 MG tablet Take 1 tablet (25 mg total) by mouth 2 (two) times daily. 60 tablet 2  . montelukast (SINGULAIR) 10 MG tablet Take 1 tablet (10 mg total) by mouth at bedtime. (Patient taking differently: Take 10 mg by mouth daily. ) 30 tablet 3  . traMADol (ULTRAM) 50 MG tablet     . triamcinolone (NASACORT AQ) 55 MCG/ACT AERO nasal inhaler Place 2 sprays into the nose daily. 1 Inhaler 12   No current facility-administered medications for this visit.      Diagnostic Tests:  n/a   Impression:  Patient  is doing well approximately 57-month status post minimally invasive resection of benign left atrial myxoma  Plan:  We have not recommended any change the patient's current medications.  Patient will continue to follow-up with Dr. Gwenlyn Found and return to our office in the future only should specific problems or questions arise.    I discussed limitations of evaluation and management via telephone.  The patient was advised to call back for repeat telephone consultation or to seek an in-person evaluation if questions arise or the patient's clinical condition changes in any significant manner.  I spent in excess of 5 minutes of non-face-to-face  time during the conduct of this telephone virtual office consultation.   Valentina Gu. Roxy Manns, MD 09/06/2019 2:53 PM

## 2019-09-06 NOTE — Patient Instructions (Signed)
Continue all previous medications without any changes at this time  

## 2019-09-16 ENCOUNTER — Ambulatory Visit (INDEPENDENT_AMBULATORY_CARE_PROVIDER_SITE_OTHER): Payer: Medicare Other | Admitting: Psychology

## 2019-09-16 DIAGNOSIS — F3289 Other specified depressive episodes: Secondary | ICD-10-CM

## 2019-09-30 ENCOUNTER — Other Ambulatory Visit: Payer: Self-pay | Admitting: Physician Assistant

## 2019-10-05 ENCOUNTER — Other Ambulatory Visit: Payer: Self-pay | Admitting: Physician Assistant

## 2019-10-12 ENCOUNTER — Ambulatory Visit (INDEPENDENT_AMBULATORY_CARE_PROVIDER_SITE_OTHER): Payer: Medicare Other | Admitting: Psychology

## 2019-10-12 DIAGNOSIS — F3289 Other specified depressive episodes: Secondary | ICD-10-CM | POA: Diagnosis not present

## 2019-10-17 ENCOUNTER — Other Ambulatory Visit: Payer: Self-pay | Admitting: Cardiology

## 2019-10-20 ENCOUNTER — Other Ambulatory Visit: Payer: Self-pay | Admitting: Internal Medicine

## 2019-10-20 DIAGNOSIS — E119 Type 2 diabetes mellitus without complications: Secondary | ICD-10-CM

## 2019-11-01 ENCOUNTER — Ambulatory Visit: Payer: Medicare Other | Attending: Internal Medicine | Admitting: Internal Medicine

## 2019-11-01 ENCOUNTER — Encounter: Payer: Self-pay | Admitting: Internal Medicine

## 2019-11-01 ENCOUNTER — Other Ambulatory Visit: Payer: Self-pay

## 2019-11-01 VITALS — BP 130/87 | HR 97 | Temp 99.0°F | Resp 16 | Wt 232.8 lb

## 2019-11-01 DIAGNOSIS — F419 Anxiety disorder, unspecified: Secondary | ICD-10-CM | POA: Diagnosis not present

## 2019-11-01 DIAGNOSIS — Z7982 Long term (current) use of aspirin: Secondary | ICD-10-CM | POA: Diagnosis not present

## 2019-11-01 DIAGNOSIS — G8929 Other chronic pain: Secondary | ICD-10-CM | POA: Insufficient documentation

## 2019-11-01 DIAGNOSIS — Z6841 Body Mass Index (BMI) 40.0 and over, adult: Secondary | ICD-10-CM | POA: Diagnosis not present

## 2019-11-01 DIAGNOSIS — Z79899 Other long term (current) drug therapy: Secondary | ICD-10-CM | POA: Diagnosis not present

## 2019-11-01 DIAGNOSIS — E559 Vitamin D deficiency, unspecified: Secondary | ICD-10-CM | POA: Diagnosis not present

## 2019-11-01 DIAGNOSIS — E119 Type 2 diabetes mellitus without complications: Secondary | ICD-10-CM | POA: Diagnosis not present

## 2019-11-01 DIAGNOSIS — M545 Low back pain: Secondary | ICD-10-CM | POA: Diagnosis not present

## 2019-11-01 DIAGNOSIS — I447 Left bundle-branch block, unspecified: Secondary | ICD-10-CM | POA: Diagnosis not present

## 2019-11-01 DIAGNOSIS — I2510 Atherosclerotic heart disease of native coronary artery without angina pectoris: Secondary | ICD-10-CM | POA: Insufficient documentation

## 2019-11-01 DIAGNOSIS — I10 Essential (primary) hypertension: Secondary | ICD-10-CM | POA: Diagnosis not present

## 2019-11-01 DIAGNOSIS — E785 Hyperlipidemia, unspecified: Secondary | ICD-10-CM | POA: Insufficient documentation

## 2019-11-01 DIAGNOSIS — Z7984 Long term (current) use of oral hypoglycemic drugs: Secondary | ICD-10-CM | POA: Diagnosis not present

## 2019-11-01 DIAGNOSIS — F329 Major depressive disorder, single episode, unspecified: Secondary | ICD-10-CM | POA: Diagnosis not present

## 2019-11-01 LAB — POCT GLYCOSYLATED HEMOGLOBIN (HGB A1C): HbA1c, POC (prediabetic range): 5.7 % (ref 5.7–6.4)

## 2019-11-01 LAB — GLUCOSE, POCT (MANUAL RESULT ENTRY): POC Glucose: 151 mg/dl — AB (ref 70–99)

## 2019-11-01 MED ORDER — METOPROLOL TARTRATE 50 MG PO TABS: 50.0000 mg | ORAL_TABLET | Freq: Two times a day (BID) | ORAL | 4 refills | Status: DC

## 2019-11-01 NOTE — Progress Notes (Signed)
Patient ID: Michele Wood, female    DOB: 1961/05/27  MRN: [id number]  CC: Diabetes and Hypertension   Subjective: Michele Wood is a 59 y.o. female who presents for chronic ds management Her concerns today include:  Hx of HTN, LT atrial myxoma (s/p minimally invasive surgery 05/2019), minimal CAD on cardiac CT, chronic sinusitis, degen disc of c-spine, chronic LBP, vit D def, obesity, anx/dep, DM (with ? neuropathy in feetfrom DM vs lower back), dep with psychosis   DM:  Needs RF on test stripes Checking BS once a day. Range 140 and lower Compliant with Metformin Eating habits:  "I do okay.  I'm not as [place] as I use to be."   No sugar drinks, she has cut back on white carbs Walk around her block once a day which takes about 15 mins  HTN: no device to check BP.  BP was low after surgery to remove LT atrial myxoma.  Norvasc d/c, HCTZ dose decreased, ACE-I d/c by cardiology. However, when asked today, pt states she is on Norvasc, HCTZ and Metoprolol limits salt in foods No CP, SOB, LE edema, dizziness  HL/CAD:  Compliant with ASA, Lipitor and Metoprolol Patient Active Problem List   Diagnosis Date Noted  . Hypotension 07/19/2019  . CAD (coronary artery disease) 06/23/2019  . S/P minimally-invasive resection of left atrial myxoma 06/08/2019  . Pancreatic cyst 05/31/2019  . Spondylolisthesis of lumbar region 03/31/2019  . Atrial myxoma 03/19/2019  . Dyslipidemia, goal LDL below 70 12/16/2018  . Family history of heart disease 12/16/2018  . Left bundle branch block 12/16/2018  . History of chest pain 12/16/2018  . MDD (major depressive disorder), recurrent episode, moderate ([place]) 10/19/2018  . Non-insulin treated type 2 diabetes mellitus ([place]) 12/12/2017  . Morbid obesity ([place]) 09/12/2017  . Anxiety and depression 06/13/2017  . Cervical radiculopathy due to intervertebral disc disorder 06/13/2017  . Chronic low back pain without sciatica 09/18/2015  . DJD  (degenerative joint disease), lumbar 06/08/2015  . Vitamin D insufficiency 04/25/2014  . HTN (hypertension) 06/21/2008     Current Outpatient Medications on File Prior to Visit  Medication Sig Dispense Refill  . amLODipine (NORVASC) 10 MG tablet     . aspirin 81 MG EC tablet TAKE 1 TABLET BY MOUTH EVERY DAY (Patient taking differently: Take 81 mg by mouth daily. ) 100 tablet 1  . atorvastatin (LIPITOR) 40 MG tablet Take 1 tablet (40 mg total) by mouth daily. 90 tablet 3  . Cholecalciferol (VITAMIN D3) 2000 UNITS TABS Take 2,000 Units by mouth daily. (Patient taking differently: Take 2,000 Units by mouth 2 (two) times a week. ) 30 tablet 11  . DULoxetine (CYMBALTA) 60 MG capsule Take 1 capsule (60 mg total) by mouth daily. 30 capsule 2  . hydrochlorothiazide (HYDRODIURIL) 12.5 MG tablet TAKE 1 TABLET BY MOUTH EVERY DAY 90 tablet 3  . hydrOXYzine (VISTARIL) 25 MG capsule Take 1 capsule (25 mg total) by mouth at bedtime as needed for anxiety. 30 capsule 2  . Lurasidone HCl 60 MG TABS Take 1 tablet (60 mg total) by mouth daily with breakfast. (Patient not taking: Reported on 11/01/2019) 30 tablet 2  . metFORMIN (GLUCOPHAGE) 500 MG tablet TAKE 1 TABLET BY MOUTH EVERY DAY WITH BREAKFAST 90 tablet 0  . methocarbamol (ROBAXIN) 750 MG tablet Take 1 tablet (750 mg total) by mouth every 6 (six) hours as needed for muscle spasms. 50 tablet 1  . montelukast (SINGULAIR) 10 MG tablet Take 1  tablet (10 mg total) by mouth at bedtime. (Patient taking differently: Take 10 mg by mouth daily. ) 30 tablet 3  . traMADol (ULTRAM) 50 MG tablet     . triamcinolone (NASACORT AQ) 55 MCG/ACT AERO nasal inhaler Place 2 sprays into the nose daily. 1 Inhaler 12   No current facility-administered medications on file prior to visit.    Allergies  Allergen Reactions  . Codeine Sulfate Rash  . Milk-Related Compounds Diarrhea and Other (See Comments)    Diarrhea and stomach upset    Social History   Socioeconomic History   . Marital status: Married    Spouse name: Michele Wood  . Number of children: 2  . Years of education: Not on file  . Highest education level: Some college, no degree  Occupational History  . Not on file  Tobacco Use  . Smoking status: Never Smoker  . Smokeless tobacco: Never Used  Substance and Sexual Activity  . Alcohol use: Yes    Alcohol/week: 0.0 standard drinks    Comment: rare  . Drug use: No  . Sexual activity: Not Currently  Other Topics Concern  . Not on file  Social History Narrative  . Not on file   Social Determinants of Health   Financial Resource Strain:   . Difficulty of Paying Living Expenses: Not on file  Food Insecurity:   . Worried About Charity fundraiser in the Last Year: Not on file  . Ran Out of Food in the Last Year: Not on file  Transportation Needs:   . Lack of Transportation (Medical): Not on file  . Lack of Transportation (Non-Medical): Not on file  Physical Activity:   . Days of Exercise per Week: Not on file  . Minutes of Exercise per Session: Not on file  Stress:   . Feeling of Stress : Not on file  Social Connections:   . Frequency of Communication with Friends and Family: Not on file  . Frequency of Social Gatherings with Friends and Family: Not on file  . Attends Religious Services: Not on file  . Active Member of Clubs or Organizations: Not on file  . Attends Archivist Meetings: Not on file  . Marital Status: Not on file  Intimate Partner Violence:   . Fear of Current or Ex-Partner: Not on file  . Emotionally Abused: Not on file  . Physically Abused: Not on file  . Sexually Abused: Not on file    Family History  Problem Relation Age of Onset  . Hypertension Mother   . Hypertension Other   . Schizophrenia Brother     Past Surgical History:  Procedure Laterality Date  . ABDOMINAL HYSTERECTOMY    . BACK SURGERY    . BREAST BIOPSY Right 07/02/2006   [place] Core benign  . MINIMALLY INVASIVE EXCISION OF ATRIAL MYXOMA N/A  06/08/2019   Procedure: MINIMALLY INVASIVE RESECTION OF LEFT ATRIAL MYXOMA;  Surgeon: [name], MD;  Location: [facility];  Service: Open Heart Surgery;  Laterality: N/A;  . NASAL SINUS SURGERY    . TEE WITHOUT CARDIOVERSION N/A 05/17/2019   Procedure: TRANSESOPHAGEAL ECHOCARDIOGRAM (TEE);  Surgeon: Acie Fredrickson Wonda Cheng, MD;  Location: [facility];  Service: Cardiovascular;  Laterality: N/A;  . TEE WITHOUT CARDIOVERSION N/A 06/08/2019   Procedure: TRANSESOPHAGEAL ECHOCARDIOGRAM (TEE);  Surgeon: [name], MD;  Location: [facility];  Service: Open Heart Surgery;  Laterality: N/A;    ROS: Review of Systems Negative except as stated above  PHYSICAL  EXAM: BP 130/87   Pulse 97   Temp 99 F (37.2 C) (Oral)   Resp 16   Wt 232 lb 12.8 oz (105.6 kg)   LMP 09/01/2013   SpO2 99%   BMI 42.58 kg/m   Wt Readings from Last 3 Encounters:  11/01/19 232 lb 12.8 oz (105.6 kg)  07/19/19 232 lb (105.2 kg)  06/27/19 229 lb 0.9 oz (103.9 kg)    Physical Exam  General appearance - alert, well appearing, and in no distress Mental status - normal mood, behavior, speech, dress, motor activity, and thought processes Neck - supple, no significant adenopathy Chest - clear to auscultation, no wheezes, rales or rhonchi, symmetric air entry Heart - normal rate, regular rhythm, normal S1, S2, no murmurs, rubs, clicks or gallops Extremities - peripheral pulses normal, no pedal edema, no clubbing or cyanosis Diabetic Foot Exam - Simple   Simple Foot Form Visual Inspection No deformities, no ulcerations, no other skin breakdown bilaterally: Yes Sensation Testing Intact to touch and monofilament testing bilaterally: Yes Pulse Check Posterior Tibialis and Dorsalis pulse intact bilaterally: Yes Comments     Results for orders placed or performed in visit on 11/01/19  POCT glucose (manual entry)  Result Value Ref Range   POC Glucose 151 (A) 70 - 99 mg/dl  POCT glycosylated hemoglobin (Hb A1C)  Result  Value Ref Range   Hemoglobin A1C     HbA1c POC (<> result, manual entry)     HbA1c, POC (prediabetic range) 5.7 5.7 - 6.4 %   HbA1c, POC (controlled diabetic range)       CMP Latest Ref Rng & Units 06/27/2019 06/23/2019 06/14/2019  Glucose 70 - 99 mg/dL 112(H) 104(H) 99  BUN 6 - 20 mg/dL 16 19 12   Creatinine 0.44 - 1.00 mg/dL 1.16(H) 1.38(H) 0.76  Sodium 135 - 145 mmol/L 140 140 137  Potassium 3.5 - 5.1 mmol/L 3.1(L) 4.0 3.7  Chloride 98 - 111 mmol/L 105 100 102  CO2 22 - 32 mmol/L 22 24 25   Calcium 8.9 - 10.3 mg/dL 8.9 9.8 8.8(L)  Total Protein 6.5 - 8.1 g/dL 7.2 - -  Total Bilirubin 0.3 - 1.2 mg/dL 0.4 - -  Alkaline Phos 38 - 126 U/L 84 - -  AST 15 - 41 U/L 12(L) - -  ALT 0 - 44 U/L 13 - -   Lipid Panel     Component Value Date/Time   CHOL 154 05/13/2019 1358   TRIG 191 (H) 05/13/2019 1358   HDL 34 (L) 05/13/2019 1358   CHOLHDL 4.5 (H) 05/13/2019 1358   CHOLHDL 6.1 10/26/2013 0949   VLDL 36 10/26/2013 0949   LDLCALC 82 05/13/2019 1358    CBC    Component Value Date/Time   WBC 10.8 (H) 06/27/2019 0040   RBC 4.15 06/27/2019 0040   HGB 9.9 (L) 06/27/2019 0040   HGB 10.0 (L) 06/23/2019 1007   HCT 33.0 (L) 06/27/2019 0040   HCT 31.0 (L) 06/23/2019 1007   PLT 461 (H) 06/27/2019 0040   PLT 496 (H) 06/23/2019 1007   MCV 79.5 (L) 06/27/2019 0040   MCV 78 (L) 06/23/2019 1007   MCH 23.9 (L) 06/27/2019 0040   MCHC 30.0 06/27/2019 0040   RDW 14.5 06/27/2019 0040   RDW 14.4 06/23/2019 1007   LYMPHSABS 2.6 06/23/2019 1007   MONOABS 0.5 12/03/2013 1508   EOSABS 0.2 06/23/2019 1007   BASOSABS 0.1 06/23/2019 1007    ASSESSMENT AND PLAN: 1. Controlled type 2 diabetes mellitus  without complication, without long-term current use of insulin (HCC) -Continue Metformin.  Commended her on trying to change her eating habits.  Further dietary counseling given.  Commended her on moving more.  Encouraged her to try to increase her exercise to 20 minutes daily. - POCT glucose (manual  entry) - POCT glycosylated hemoglobin (Hb A1C) - Microalbumin / creatinine urine ratio  2. Class 3 severe obesity due to excess calories with serious comorbidity and body mass index (BMI) of 40.0 to 44.9 in adult [facility]) See #1 above  3. Essential hypertension DBP not at goal. Recommend increase Metoprolol to 50 mg BID - Basic Metabolic Panel; Future - metoprolol tartrate (LOPRESSOR) 50 MG tablet; Take 1 tablet (50 mg total) by mouth 2 (two) times daily.  Dispense: 60 tablet; Refill: 4  Patient was given the opportunity to ask questions.  Patient verbalized understanding of the plan and was able to repeat key elements of the plan.   Orders Placed This Encounter  Procedures  . Microalbumin / creatinine urine ratio  . Basic Metabolic Panel  . POCT glucose (manual entry)  . POCT glycosylated hemoglobin (Hb A1C)     Requested Prescriptions   Signed Prescriptions Disp Refills  . metoprolol tartrate (LOPRESSOR) 50 MG tablet 60 tablet 4    Sig: Take 1 tablet (50 mg total) by mouth 2 (two) times daily.    Return in about 4 months (around 02/29/2020).  Karle Plumber, MD, FACP

## 2019-11-01 NOTE — Patient Instructions (Signed)
Increase metoprolol to 50 mg twice a day for better blood pressure control.  Please remember to return to the lab later this week to have blood test done.

## 2019-11-02 ENCOUNTER — Ambulatory Visit (INDEPENDENT_AMBULATORY_CARE_PROVIDER_SITE_OTHER): Payer: Medicare Other | Admitting: Psychology

## 2019-11-02 DIAGNOSIS — F3289 Other specified depressive episodes: Secondary | ICD-10-CM | POA: Diagnosis not present

## 2019-11-03 LAB — MICROALBUMIN / CREATININE URINE RATIO
Creatinine, Urine: 268.2 mg/dL
Microalb/Creat Ratio: 7 mg/g creat (ref 0–29)
Microalbumin, Urine: 19.1 ug/mL

## 2019-11-05 ENCOUNTER — Other Ambulatory Visit: Payer: Self-pay

## 2019-11-05 ENCOUNTER — Ambulatory Visit: Payer: Medicare Other | Attending: Internal Medicine

## 2019-11-05 DIAGNOSIS — H02831 Dermatochalasis of right upper eyelid: Secondary | ICD-10-CM | POA: Diagnosis not present

## 2019-11-05 DIAGNOSIS — I10 Essential (primary) hypertension: Secondary | ICD-10-CM

## 2019-11-05 DIAGNOSIS — E119 Type 2 diabetes mellitus without complications: Secondary | ICD-10-CM | POA: Diagnosis not present

## 2019-11-05 DIAGNOSIS — H10413 Chronic giant papillary conjunctivitis, bilateral: Secondary | ICD-10-CM | POA: Diagnosis not present

## 2019-11-05 DIAGNOSIS — H2513 Age-related nuclear cataract, bilateral: Secondary | ICD-10-CM | POA: Diagnosis not present

## 2019-11-05 DIAGNOSIS — H02834 Dermatochalasis of left upper eyelid: Secondary | ICD-10-CM | POA: Diagnosis not present

## 2019-11-05 DIAGNOSIS — H35033 Hypertensive retinopathy, bilateral: Secondary | ICD-10-CM | POA: Diagnosis not present

## 2019-11-05 LAB — HM DIABETES EYE EXAM

## 2019-11-06 LAB — BASIC METABOLIC PANEL
BUN/Creatinine Ratio: 16 (ref 9–23)
BUN: 14 mg/dL (ref 6–24)
CO2: 26 mmol/L (ref 20–29)
Calcium: 9.9 mg/dL (ref 8.7–10.2)
Chloride: 103 mmol/L (ref 96–106)
Creatinine, Ser: 0.86 mg/dL (ref 0.57–1.00)
GFR calc Af Amer: 87 mL/min/{1.73_m2} (ref >59)
GFR calc non Af Amer: 75 mL/min/{1.73_m2} (ref >59)
Glucose: 92 mg/dL (ref 65–99)
Potassium: 3.7 mmol/L (ref 3.5–5.2)
Sodium: 145 mmol/L — ABNORMAL HIGH (ref 134–144)

## 2019-11-15 ENCOUNTER — Ambulatory Visit (INDEPENDENT_AMBULATORY_CARE_PROVIDER_SITE_OTHER): Payer: Medicare Other | Admitting: Podiatry

## 2019-11-15 ENCOUNTER — Other Ambulatory Visit: Payer: Self-pay

## 2019-11-15 ENCOUNTER — Encounter: Payer: Self-pay | Admitting: Podiatry

## 2019-11-15 DIAGNOSIS — M79674 Pain in right toe(s): Secondary | ICD-10-CM | POA: Diagnosis not present

## 2019-11-15 DIAGNOSIS — L84 Corns and callosities: Secondary | ICD-10-CM

## 2019-11-15 DIAGNOSIS — M79675 Pain in left toe(s): Secondary | ICD-10-CM

## 2019-11-15 DIAGNOSIS — E119 Type 2 diabetes mellitus without complications: Secondary | ICD-10-CM

## 2019-11-15 DIAGNOSIS — B351 Tinea unguium: Secondary | ICD-10-CM

## 2019-11-15 NOTE — Patient Instructions (Signed)
Diabetes Mellitus and Foot Care Foot care is an important part of your health, especially when you have diabetes. Diabetes may cause you to have problems because of poor blood flow (circulation) to your feet and legs, which can cause your skin to:  Become thinner and drier.  Break more easily.  Heal more slowly.  Peel and crack. You may also have nerve damage (neuropathy) in your legs and feet, causing decreased feeling in them. This means that you may not notice minor injuries to your feet that could lead to more serious problems. Noticing and addressing any potential problems early is the best way to prevent future foot problems. How to care for your feet Foot hygiene  Wash your feet daily with warm water and mild soap. Do not use hot water. Then, pat your feet and the areas between your toes until they are completely dry. Do not soak your feet as this can dry your skin.  Trim your toenails straight across. Do not dig under them or around the cuticle. File the edges of your nails with an emery board or nail file.  Apply a moisturizing lotion or petroleum jelly to the skin on your feet and to dry, brittle toenails. Use lotion that does not contain alcohol and is unscented. Do not apply lotion between your toes. Shoes and socks  Wear clean socks or stockings every day. Make sure they are not too tight. Do not wear knee-high stockings since they may decrease blood flow to your legs.  Wear shoes that fit properly and have enough cushioning. Always look in your shoes before you put them on to be sure there are no objects inside.  To break in new shoes, wear them for just a few hours a day. This prevents injuries on your feet. Wounds, scrapes, corns, and calluses  Check your feet daily for blisters, cuts, bruises, sores, and redness. If you cannot see the bottom of your feet, use a mirror or ask someone for help.  Do not cut corns or calluses or try to remove them with medicine.  If you  find a minor scrape, cut, or break in the skin on your feet, keep it and the skin around it clean and dry. You may clean these areas with mild soap and water. Do not clean the area with peroxide, alcohol, or iodine.  If you have a wound, scrape, corn, or callus on your foot, look at it several times a day to make sure it is healing and not infected. Check for: ? Redness, swelling, or pain. ? Fluid or blood. ? Warmth. ? Pus or a bad smell. General instructions  Do not cross your legs. This may decrease blood flow to your feet.  Do not use heating pads or hot water bottles on your feet. They may burn your skin. If you have lost feeling in your feet or legs, you may not know this is happening until it is too late.  Protect your feet from hot and cold by wearing shoes, such as at the beach or on hot pavement.  Schedule a complete foot exam at least once a year (annually) or more often if you have foot problems. If you have foot problems, report any cuts, sores, or bruises to your health care provider immediately. Contact a health care provider if:  You have a medical condition that increases your risk of infection and you have any cuts, sores, or bruises on your feet.  You have an injury that is not   healing.  You have redness on your legs or feet.  You feel burning or tingling in your legs or feet.  You have pain or cramps in your legs and feet.  Your legs or feet are numb.  Your feet always feel cold.  You have pain around a toenail. Get help right away if:  You have a wound, scrape, corn, or callus on your foot and: ? You have pain, swelling, or redness that gets worse. ? You have fluid or blood coming from the wound, scrape, corn, or callus. ? Your wound, scrape, corn, or callus feels warm to the touch. ? You have pus or a bad smell coming from the wound, scrape, corn, or callus. ? You have a fever. ? You have a red line going up your leg. Summary  Check your feet every day  for cuts, sores, red spots, swelling, and blisters.  Moisturize feet and legs daily.  Wear shoes that fit properly and have enough cushioning.  If you have foot problems, report any cuts, sores, or bruises to your health care provider immediately.  Schedule a complete foot exam at least once a year (annually) or more often if you have foot problems. This information is not intended to replace advice given to you by your health care provider. Make sure you discuss any questions you have with your health care provider. Document Revised: 06/23/2019 Document Reviewed: 11/01/2016 Elsevier Patient Education  2020 [employer] Inc[employer]  

## 2019-11-15 NOTE — Progress Notes (Signed)
Subjective: Michele Wood presents today for follow up of preventative diabetic foot care and callus(es) b/l feet and painful mycotic toenails b/l that are difficult to trim. Pain interferes with ambulation. Aggravating factors include wearing enclosed shoe gear. Pain is relieved with periodic professional debridement..   She voices no new pedal concerns on today's visit.  She and her husband are looking to move into their own house soon.   Allergies  Allergen Reactions  . Codeine Sulfate Rash  . Milk-Related Compounds Diarrhea and Other (See Comments)    Diarrhea and stomach upset    Objective: There were no vitals filed for this visit.  Vascular Examination:  Capillary refill time to digits immediate b/l, palpable DP pulses b/l, palpable PT pulses b/l, pedal hair sparse b/l and skin temperature gradient within normal limits b/l  Dermatological Examination: Pedal skin with normal turgor, texture and tone bilaterally, no open wounds bilaterally, no interdigital macerations bilaterally, toenails 1-5 b/l elongated, dystrophic, thickened, crumbly with subungual debris and hyperkeratotic lesion(s) right hallux.  No erythema, no edema, no drainage, no flocculence  Musculoskeletal: Normal muscle strength 5/5 to all lower extremity muscle groups bilaterally, no gross bony deformities bilaterally and no pain crepitus or joint limitation noted with ROM b/l  Neurological: Protective sensation intact 5/5 intact bilaterally with 10g monofilament b/l and vibratory sensation intact b/l  Assessment: 1. Pain due to onychomycosis of toenails of both feet   2. Callus   3. Controlled type 2 diabetes mellitus without complication, without long-term current use of insulin ([place])     Plan: -Continue diabetic foot care principles. Literature dispensed on today.  -Toenails 1-5 b/l were debrided in length and girth without iatrogenic bleeding. -corns and calluses were debrided without complication or  incident. Total number debrided =1 right hallux -Patient to continue soft, supportive shoe gear daily. -Patient to report any pedal injuries to medical professional immediately. -Patient/POA to call should there be question/concern in the interim.  Return in about 3 months (around 02/12/2020) for diabetic nail and callus trim.

## 2019-11-17 ENCOUNTER — Other Ambulatory Visit: Payer: Self-pay

## 2019-11-17 ENCOUNTER — Ambulatory Visit (INDEPENDENT_AMBULATORY_CARE_PROVIDER_SITE_OTHER): Payer: Medicare (Managed Care) | Admitting: Psychiatry

## 2019-11-17 ENCOUNTER — Encounter (HOSPITAL_COMMUNITY): Payer: Self-pay | Admitting: Psychiatry

## 2019-11-17 DIAGNOSIS — F331 Major depressive disorder, recurrent, moderate: Secondary | ICD-10-CM

## 2019-11-17 DIAGNOSIS — F4310 Post-traumatic stress disorder, unspecified: Secondary | ICD-10-CM

## 2019-11-17 MED ORDER — DULOXETINE HCL 60 MG PO CPEP: 60.0000 mg | ORAL_CAPSULE | Freq: Every day | ORAL | 2 refills | Status: DC

## 2019-11-17 MED ORDER — LURASIDONE HCL 60 MG PO TABS: 60.0000 mg | ORAL_TABLET | Freq: Every day | ORAL | 2 refills | Status: DC

## 2019-11-17 MED ORDER — HYDROXYZINE PAMOATE 25 MG PO CAPS: 25.0000 mg | ORAL_CAPSULE | Freq: Every evening | ORAL | 2 refills | Status: DC | PRN
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
## 2019-11-24 ENCOUNTER — Ambulatory Visit (INDEPENDENT_AMBULATORY_CARE_PROVIDER_SITE_OTHER): Payer: Medicare (Managed Care) | Admitting: Psychology

## 2019-11-24 DIAGNOSIS — F3289 Other specified depressive episodes: Secondary | ICD-10-CM | POA: Diagnosis not present

## 2019-11-30 LAB — BLOOD GAS, ARTERIAL
Acid-Base Excess: 2.3 mmol/L — ABNORMAL HIGH (ref 0.0–2.0)
Bicarbonate: 26.1 mmol/L (ref 20.0–28.0)
Drawn by: 470591
FIO2: 21
O2 Saturation: 98.2 %
Patient temperature: 98.6
pCO2 arterial: 38.4 mmHg (ref 32.0–48.0)
pH, Arterial: 7.446 (ref 7.350–7.450)
pO2, Arterial: 110 mmHg — ABNORMAL HIGH (ref 83.0–108.0)

## 2019-12-03 DIAGNOSIS — M4316 Spondylolisthesis, lumbar region: Secondary | ICD-10-CM | POA: Diagnosis not present

## 2019-12-03 DIAGNOSIS — Z981 Arthrodesis status: Secondary | ICD-10-CM | POA: Diagnosis not present

## 2019-12-14 ENCOUNTER — Ambulatory Visit (INDEPENDENT_AMBULATORY_CARE_PROVIDER_SITE_OTHER): Payer: Medicare (Managed Care) | Admitting: Psychology

## 2019-12-14 DIAGNOSIS — F3289 Other specified depressive episodes: Secondary | ICD-10-CM

## 2019-12-16 ENCOUNTER — Other Ambulatory Visit (HOSPITAL_COMMUNITY): Payer: Self-pay | Admitting: Psychiatry

## 2019-12-16 DIAGNOSIS — F4310 Post-traumatic stress disorder, unspecified: Secondary | ICD-10-CM

## 2019-12-29 ENCOUNTER — Other Ambulatory Visit (HOSPITAL_COMMUNITY): Payer: Self-pay | Admitting: Psychiatry

## 2019-12-29 DIAGNOSIS — F4310 Post-traumatic stress disorder, unspecified: Secondary | ICD-10-CM

## 2019-12-29 DIAGNOSIS — F331 Major depressive disorder, recurrent, moderate: Secondary | ICD-10-CM

## 2020-01-04 ENCOUNTER — Ambulatory Visit (INDEPENDENT_AMBULATORY_CARE_PROVIDER_SITE_OTHER): Payer: Medicare (Managed Care) | Admitting: Psychology

## 2020-01-04 DIAGNOSIS — F3289 Other specified depressive episodes: Secondary | ICD-10-CM | POA: Diagnosis not present

## 2020-01-05 ENCOUNTER — Other Ambulatory Visit: Payer: Self-pay

## 2020-01-05 ENCOUNTER — Ambulatory Visit (HOSPITAL_COMMUNITY)
Admission: EM | Admit: 2020-01-05 | Discharge: 2020-01-05 | Disposition: A | Discharge status: Home / Self Care | Payer: Medicare (Managed Care) | Attending: Family Medicine | Admitting: Family Medicine

## 2020-01-05 ENCOUNTER — Encounter (HOSPITAL_COMMUNITY): Payer: Self-pay

## 2020-01-05 DIAGNOSIS — Z20822 Contact with and (suspected) exposure to covid-19: Secondary | ICD-10-CM | POA: Diagnosis present

## 2020-01-05 DIAGNOSIS — R52 Pain, unspecified: Secondary | ICD-10-CM | POA: Diagnosis not present

## 2020-01-05 DIAGNOSIS — U071 COVID-19: Secondary | ICD-10-CM | POA: Diagnosis not present

## 2020-01-05 DIAGNOSIS — R058 Other specified cough: Secondary | ICD-10-CM

## 2020-01-05 DIAGNOSIS — R05 Cough: Secondary | ICD-10-CM

## 2020-01-05 NOTE — ED Provider Notes (Signed)
[facility]    CSN: [id number] Arrival date & time: 01/05/20  1108      History   Chief Complaint Chief Complaint  Patient presents with  . URI    HPI Michele Wood is a 59 y.o. female.   Pt is a 59 year old female with PMH of diabetes, HTN, obesity. She presents with dry cough, congestion and body aches. This has been for 2 days. Symptoms have been constant. She has been using OTC medicines as needed. This helps some. No fever.  Exposed to covid at home.   ROS per HPI      Past Medical History:  Diagnosis Date  . Anginal pain ([place])   . Chronic back pain   . DDD (degenerative disc disease), cervical   . Depression   . Diabetes mellitus without complication ([place])   . Dyspnea   . Hypertension    at age 84  . Obesity   . Pancreatic cyst 05/31/2019   Incidental - discovered on CT scan  . s/p minimally-invasive resection of left atrial myxoma 06/08/2019  . Sinusitis   . Sleep apnea    at age 26    Patient Active Problem List   Diagnosis Date Noted  . Hypotension 07/19/2019  . CAD (coronary artery disease) 06/23/2019  . S/P minimally-invasive resection of left atrial myxoma 06/08/2019  . Pancreatic cyst 05/31/2019  . Spondylolisthesis of lumbar region 03/31/2019  . Atrial myxoma 03/19/2019  . Dyslipidemia, goal LDL below 70 12/16/2018  . Family history of heart disease 12/16/2018  . Left bundle branch block 12/16/2018  . History of chest pain 12/16/2018  . MDD (major depressive disorder), recurrent episode, moderate ([place]) 10/19/2018  . Non-insulin treated type 2 diabetes mellitus ([place]) 12/12/2017  . Morbid obesity ([place]) 09/12/2017  . Anxiety and depression 06/13/2017  . Cervical radiculopathy due to intervertebral disc disorder 06/13/2017  . Chronic low back pain without sciatica 09/18/2015  . DJD (degenerative joint disease), lumbar 06/08/2015  . Vitamin D insufficiency 04/25/2014  . HTN (hypertension) 06/21/2008    Past Surgical History:   Procedure Laterality Date  . ABDOMINAL HYSTERECTOMY    . BACK SURGERY    . BREAST BIOPSY Right 07/02/2006   [place] Core benign  . MINIMALLY INVASIVE EXCISION OF ATRIAL MYXOMA N/A 06/08/2019   Procedure: MINIMALLY INVASIVE RESECTION OF LEFT ATRIAL MYXOMA;  Surgeon: [name], MD;  Location: [facility];  Service: Open Heart Surgery;  Laterality: N/A;  . NASAL SINUS SURGERY    . TEE WITHOUT CARDIOVERSION N/A 05/17/2019   Procedure: TRANSESOPHAGEAL ECHOCARDIOGRAM (TEE);  Surgeon: Acie Fredrickson Wonda Cheng, MD;  Location: [facility];  Service: Cardiovascular;  Laterality: N/A;  . TEE WITHOUT CARDIOVERSION N/A 06/08/2019   Procedure: TRANSESOPHAGEAL ECHOCARDIOGRAM (TEE);  Surgeon: [name], MD;  Location: [facility];  Service: Open Heart Surgery;  Laterality: N/A;    OB History   No obstetric history on file.      Home Medications    Prior to Admission medications   Medication Sig Start Date End Date Taking? Authorizing Provider  amLODipine (NORVASC) 10 MG tablet  07/18/19  Yes [provider]  aspirin 81 MG EC tablet TAKE 1 TABLET BY MOUTH EVERY DAY Patient taking differently: Take 81 mg by mouth daily.  05/10/19  Yes Ladell Pier, MD  atorvastatin (LIPITOR) 40 MG tablet Take 1 tablet (40 mg total) by mouth daily. 01/08/19  Yes Lorretta Harp, MD  Cholecalciferol (VITAMIN D3) 2000 UNITS TABS  Take 2,000 Units by mouth daily. Patient taking differently: Take 2,000 Units by mouth 2 (two) times a week.  09/19/15  Yes Funches, Josalyn, MD  DULoxetine (CYMBALTA) 60 MG capsule Take 1 capsule (60 mg total) by mouth daily. 11/17/19  Yes Arfeen, Arlyce Harman, MD  hydrochlorothiazide (HYDRODIURIL) 12.5 MG tablet TAKE 1 TABLET BY MOUTH EVERY DAY 10/19/19  Yes Kilroy, Luke K, PA-C  hydrOXYzine (VISTARIL) 25 MG capsule Take 1 capsule (25 mg total) by mouth at bedtime as needed for anxiety. 11/17/19  Yes Arfeen, Arlyce Harman, MD  Lurasidone HCl 60 MG TABS Take 1 tablet (60 mg total) by mouth daily with breakfast.  11/17/19  Yes Arfeen, Arlyce Harman, MD  metFORMIN (GLUCOPHAGE) 500 MG tablet TAKE 1 TABLET BY MOUTH EVERY DAY WITH BREAKFAST 10/20/19  Yes Ladell Pier, MD  methocarbamol (ROBAXIN) 750 MG tablet Take 1 tablet (750 mg total) by mouth every 6 (six) hours as needed for muscle spasms. 04/02/19  Yes Newman Pies, MD  montelukast (SINGULAIR) 10 MG tablet Take 1 tablet (10 mg total) by mouth at bedtime. Patient taking differently: Take 10 mg by mouth daily.  08/22/16  Yes Funches, Josalyn, MD  metoprolol tartrate (LOPRESSOR) 50 MG tablet Take 1 tablet (50 mg total) by mouth 2 (two) times daily. 11/01/19   Ladell Pier, MD  mupirocin ointment (BACTROBAN) 2 % APPLY NASALLY TWICE A DAY FOR 5 DAYS 06/04/19   [provider]  traMADol Veatrice Bourbon) 50 MG tablet  07/30/19   [provider]  triamcinolone (NASACORT AQ) 55 MCG/ACT AERO nasal inhaler Place 2 sprays into the nose daily. 08/22/16   Boykin Nearing, MD    Family History Family History  Problem Relation Age of Onset  . Hypertension Mother   . Hypertension Other   . Schizophrenia Brother     Social History Social History   Tobacco Use  . Smoking status: Never Smoker  . Smokeless tobacco: Never Used  Substance Use Topics  . Alcohol use: Yes    Alcohol/week: 0.0 standard drinks    Comment: rare  . Drug use: No     Allergies   Codeine sulfate and Milk-related compounds   Review of Systems Review of Systems   Physical Exam Triage Vital Signs ED Triage Vitals  Enc Vitals Group     BP 01/05/20 1150 (!) 129/97     Pulse Rate 01/05/20 1150 83     Resp 01/05/20 1150 16     Temp 01/05/20 1150 99.5 F (37.5 C)     Temp Source 01/05/20 1150 Oral     SpO2 01/05/20 1150 97 %     Weight --      Height --      Head Circumference --      Peak Flow --      Pain Score 01/05/20 1148 4     Pain Loc --      Pain Edu? --      Excl. in [place]? --    No data found.  Updated Vital Signs BP (!) 129/97 (BP Location: Right  Arm)   Pulse 83   Temp 99.5 F (37.5 C) (Oral)   Resp 16   LMP 09/01/2013   SpO2 97%   Visual Acuity Right Eye Distance:   Left Eye Distance:   Bilateral Distance:    Right Eye Near:   Left Eye Near:    Bilateral Near:     Physical Exam Vitals and nursing note reviewed.  Constitutional:  General: She is not in acute distress.    Appearance: Normal appearance. She is not ill-appearing, toxic-appearing or diaphoretic.  HENT:     Head: Normocephalic.     Nose: Nose normal.     Mouth/Throat:     Mouth: Mucous membranes are moist.     Pharynx: Oropharynx is clear.  Eyes:     Conjunctiva/sclera: Conjunctivae normal.  Cardiovascular:     Rate and Rhythm: Normal rate and regular rhythm.  Pulmonary:     Effort: Pulmonary effort is normal.     Breath sounds: Normal breath sounds.  Musculoskeletal:        General: Normal range of motion.     Cervical back: Normal range of motion.  Skin:    General: Skin is warm and dry.     Findings: No rash.  Neurological:     Mental Status: She is alert.  Psychiatric:        Mood and Affect: Mood normal.      UC Treatments / Results  Labs (all labs ordered are listed, but only abnormal results are displayed) Labs Reviewed  SARS CORONAVIRUS 2 (TAT 6-24 HRS)    EKG   Radiology No results found.  Procedures Procedures (including critical care time)  Medications Ordered in UC Medications - No data to display  Initial Impression / Assessment and Plan / UC Course  I have reviewed the triage vital signs and the nursing notes.  Pertinent labs & imaging results that were available during my care of the patient were reviewed by me and considered in my medical decision making (see chart for details).     covid exposure with symptoms.  Exam benign.  No concern for hypoxia.  OTC meds as needed  covid swab pending.  Quarantine precautions given.  Follow up as needed for continued or worsening symptoms  Final Clinical  Impressions(s) / UC Diagnoses   Final diagnoses:  Cough with exposure to COVID-19 virus     Discharge Instructions     We have tested you for COVID  Go home and quarantine until we get your  results.  You can take OTC medications as needed.      ED Prescriptions    None     PDMP not reviewed this encounter.   Orvan July, NP 01/05/20 1351

## 2020-01-05 NOTE — ED Triage Notes (Signed)
Pt reports generalized aches and pains partnered with chest congestion that started on 3/22.

## 2020-01-05 NOTE — Discharge Instructions (Addendum)
We have tested you for COVID  Go home and quarantine until we get your  results.  You can take OTC medications as needed.

## 2020-01-06 ENCOUNTER — Telehealth (HOSPITAL_COMMUNITY): Payer: Self-pay | Admitting: *Deleted

## 2020-01-06 LAB — SARS CORONAVIRUS 2 (TAT 6-24 HRS): SARS Coronavirus 2: POSITIVE — AB

## 2020-01-06 NOTE — Telephone Encounter (Signed)
Your test for COVID-19 was positive ("detected"), meaning that you were infected with the novel coronavirus and could give the germ to others.    Please continue isolation at home, for at least 10 days since the start of your fever/cough/breathlessness and until you have had 24 hours without fever (without taking a fever reducer) and with any cough/breathlessness improving. Use over-the-counter medications for symptoms. Quarantine dates March 22-April 1st.  Please continue good preventive care measures, including:  frequent hand-washing, avoid touching your face, cover coughs/sneezes, stay out of crowds and keep a 6 foot distance from others.  Clean hard surfaces touched frequently with disinfectant cleaning products.   Please check in with your primary care provider about your positive test result.  Go to the nearest urgent care or ED for assessment if you have severe breathlessness or severe weakness/fatigue (ex needing new help getting out of bed or to the bathroom).  Members of your household will also need to quarantine for 14 days from the date of your positive test. You may be contacted to discuss possible treatment options, and you may also be contacted by the health department for follow up. Please call  at [phone] if you have any questions or concerns.

## 2020-01-07 ENCOUNTER — Telehealth: Payer: Self-pay | Admitting: Physician Assistant

## 2020-01-07 ENCOUNTER — Encounter: Payer: Self-pay | Admitting: Physician Assistant

## 2020-01-07 NOTE — Telephone Encounter (Signed)
Called to discuss with [name] about Covid symptoms and the use of bamlanivimab or casirivimab/imdevimab, a monoclonal antibody infusion for those with mild to moderate Covid symptoms and at a high risk of hospitalization.     Pt is qualified for this infusion at the [facility] infusion center due to co-morbid conditions and/or a member of an at-risk group, however declines infusion at this time. Symptoms tier reviewed as well as criteria for ending isolation.  Symptoms reviewed that would warrant ED/Hospital evaluation. Preventative practices reviewed. Patient verbalized understanding. Patient advised to call back if he decides that he does want to get infusion. Callback number to the infusion center given. Patient advised to go to Urgent care or ED with severe symptoms. Last date pt would be eligible for infusion is 01/13/20.    Patient Active Problem List   Diagnosis Date Noted  . Hypotension 07/19/2019  . CAD (coronary artery disease) 06/23/2019  . S/P minimally-invasive resection of left atrial myxoma 06/08/2019  . Pancreatic cyst 05/31/2019  . Spondylolisthesis of lumbar region 03/31/2019  . Atrial myxoma 03/19/2019  . Dyslipidemia, goal LDL below 70 12/16/2018  . Family history of heart disease 12/16/2018  . Left bundle branch block 12/16/2018  . History of chest pain 12/16/2018  . MDD (major depressive disorder), recurrent episode, moderate ([place]) 10/19/2018  . Non-insulin treated type 2 diabetes mellitus ([place]) 12/12/2017  . Morbid obesity ([place]) 09/12/2017  . Anxiety and depression 06/13/2017  . Cervical radiculopathy due to intervertebral disc disorder 06/13/2017  . Chronic low back pain without sciatica 09/18/2015  . DJD (degenerative joint disease), lumbar 06/08/2015  . Vitamin D insufficiency 04/25/2014  . HTN (hypertension) 06/21/2008    Angelena Form PA-C

## 2020-01-10 ENCOUNTER — Other Ambulatory Visit: Payer: Self-pay | Admitting: Internal Medicine

## 2020-01-10 DIAGNOSIS — R079 Chest pain, unspecified: Secondary | ICD-10-CM

## 2020-01-15 ENCOUNTER — Other Ambulatory Visit: Payer: Self-pay | Admitting: Cardiovascular Disease

## 2020-01-31 ENCOUNTER — Ambulatory Visit (INDEPENDENT_AMBULATORY_CARE_PROVIDER_SITE_OTHER): Payer: Medicare (Managed Care) | Admitting: Psychology

## 2020-01-31 DIAGNOSIS — F3289 Other specified depressive episodes: Secondary | ICD-10-CM

## 2020-02-12 ENCOUNTER — Other Ambulatory Visit (HOSPITAL_COMMUNITY): Payer: Self-pay | Admitting: Psychiatry

## 2020-02-12 DIAGNOSIS — F4310 Post-traumatic stress disorder, unspecified: Secondary | ICD-10-CM

## 2020-02-13 ENCOUNTER — Other Ambulatory Visit: Payer: Self-pay | Admitting: Internal Medicine

## 2020-02-13 DIAGNOSIS — I10 Essential (primary) hypertension: Secondary | ICD-10-CM

## 2020-02-14 ENCOUNTER — Encounter: Payer: Self-pay | Admitting: Podiatry

## 2020-02-14 ENCOUNTER — Other Ambulatory Visit: Payer: Self-pay

## 2020-02-14 ENCOUNTER — Ambulatory Visit (INDEPENDENT_AMBULATORY_CARE_PROVIDER_SITE_OTHER): Payer: Medicare (Managed Care) | Admitting: Podiatry

## 2020-02-14 VITALS — Temp 97.0°F

## 2020-02-14 DIAGNOSIS — M79675 Pain in left toe(s): Secondary | ICD-10-CM | POA: Diagnosis not present

## 2020-02-14 DIAGNOSIS — B351 Tinea unguium: Secondary | ICD-10-CM | POA: Diagnosis not present

## 2020-02-14 DIAGNOSIS — M79674 Pain in right toe(s): Secondary | ICD-10-CM

## 2020-02-14 DIAGNOSIS — L84 Corns and callosities: Secondary | ICD-10-CM | POA: Diagnosis not present

## 2020-02-14 DIAGNOSIS — E119 Type 2 diabetes mellitus without complications: Secondary | ICD-10-CM

## 2020-02-14 NOTE — Progress Notes (Signed)
Subjective: Michele Wood presents today for follow up of preventative diabetic foot care and callus(es) right hallux and painful mycotic toenails b/l that are difficult to trim. Pain interferes with ambulation. Aggravating factors include wearing enclosed shoe gear. Pain is relieved with periodic professional debridement..  She voices no new pedal concerns on today's visit.   Allergies  Allergen Reactions  . Codeine Sulfate Rash  . Milk-Related Compounds Diarrhea and Other (See Comments)    Diarrhea and stomach upset     Objective: Vitals:   02/14/20 1026  Temp: (!) 87 F (36.1 C)    Pt is a pleasant 59 y.o. year old AA female, obese,  in NAD. AAO x 3.   Vascular Examination:  Capillary refill time to digits immediate b/l. Palpable DP pulses b/l. Palpable PT pulses b/l. Pedal hair sparse b/l. Skin temperature gradient within normal limits b/l. No edema noted b/l.  Dermatological Examination: Pedal skin with normal turgor, texture and tone bilaterally. No open wounds bilaterally. No interdigital macerations bilaterally. Toenails 1-5 b/l elongated, dystrophic, thickened, crumbly with subungual debris and tenderness to dorsal palpation. Hyperkeratotic lesion(s) R hallux.  No erythema, no edema, no drainage, no flocculence.  Musculoskeletal: Normal muscle strength 5/5 to all lower extremity muscle groups bilaterally. No gross bony deformities bilaterally. No pain crepitus or joint limitation noted with ROM b/l. Utilizes cane for ambulation assistance.  Neurological: Protective sensation intact 5/5 intact bilaterally with 10g monofilament b/l. Vibratory sensation intact b/l.  Assessment: 1. Pain due to onychomycosis of toenails of both feet   2. Callus   3. Controlled type 2 diabetes mellitus without complication, without long-term current use of insulin ([place])    Plan: -Continue diabetic foot care principles. Literature dispensed on today.  -Toenails 1-5 b/l were debrided in  length and girth with sterile nail nippers and dremel without iatrogenic bleeding.  -Callus(es) R hallux pared utilizing sterile scalpel blade without complication or incident. Total number debrided =1. -Patient to continue soft, supportive shoe gear daily. -Patient to report any pedal injuries to medical professional immediately. -Patient/POA to call should there be question/concern in the interim.  Return in about 3 months (around 05/16/2020) for diabetic nail and callus trim.  Marzetta Board, DPM

## 2020-02-14 NOTE — Patient Instructions (Signed)
Diabetes Mellitus and Foot Care Foot care is an important part of your health, especially when you have diabetes. Diabetes may cause you to have problems because of poor blood flow (circulation) to your feet and legs, which can cause your skin to:  Become thinner and drier.  Break more easily.  Heal more slowly.  Peel and crack. You may also have nerve damage (neuropathy) in your legs and feet, causing decreased feeling in them. This means that you may not notice minor injuries to your feet that could lead to more serious problems. Noticing and addressing any potential problems early is the best way to prevent future foot problems. How to care for your feet Foot hygiene  Wash your feet daily with warm water and mild soap. Do not use hot water. Then, pat your feet and the areas between your toes until they are completely dry. Do not soak your feet as this can dry your skin.  Trim your toenails straight across. Do not dig under them or around the cuticle. File the edges of your nails with an emery board or nail file.  Apply a moisturizing lotion or petroleum jelly to the skin on your feet and to dry, brittle toenails. Use lotion that does not contain alcohol and is unscented. Do not apply lotion between your toes. Shoes and socks  Wear clean socks or stockings every day. Make sure they are not too tight. Do not wear knee-high stockings since they may decrease blood flow to your legs.  Wear shoes that fit properly and have enough cushioning. Always look in your shoes before you put them on to be sure there are no objects inside.  To break in new shoes, wear them for just a few hours a day. This prevents injuries on your feet. Wounds, scrapes, corns, and calluses  Check your feet daily for blisters, cuts, bruises, sores, and redness. If you cannot see the bottom of your feet, use a mirror or ask someone for help.  Do not cut corns or calluses or try to remove them with medicine.  If you  find a minor scrape, cut, or break in the skin on your feet, keep it and the skin around it clean and dry. You may clean these areas with mild soap and water. Do not clean the area with peroxide, alcohol, or iodine.  If you have a wound, scrape, corn, or callus on your foot, look at it several times a day to make sure it is healing and not infected. Check for: ? Redness, swelling, or pain. ? Fluid or blood. ? Warmth. ? Pus or a bad smell. General instructions  Do not cross your legs. This may decrease blood flow to your feet.  Do not use heating pads or hot water bottles on your feet. They may burn your skin. If you have lost feeling in your feet or legs, you may not know this is happening until it is too late.  Protect your feet from hot and cold by wearing shoes, such as at the beach or on hot pavement.  Schedule a complete foot exam at least once a year (annually) or more often if you have foot problems. If you have foot problems, report any cuts, sores, or bruises to your health care provider immediately. Contact a health care provider if:  You have a medical condition that increases your risk of infection and you have any cuts, sores, or bruises on your feet.  You have an injury that is not   healing.  You have redness on your legs or feet.  You feel burning or tingling in your legs or feet.  You have pain or cramps in your legs and feet.  Your legs or feet are numb.  Your feet always feel cold.  You have pain around a toenail. Get help right away if:  You have a wound, scrape, corn, or callus on your foot and: ? You have pain, swelling, or redness that gets worse. ? You have fluid or blood coming from the wound, scrape, corn, or callus. ? Your wound, scrape, corn, or callus feels warm to the touch. ? You have pus or a bad smell coming from the wound, scrape, corn, or callus. ? You have a fever. ? You have a red line going up your leg. Summary  Check your feet every day  for cuts, sores, red spots, swelling, and blisters.  Moisturize feet and legs daily.  Wear shoes that fit properly and have enough cushioning.  If you have foot problems, report any cuts, sores, or bruises to your health care provider immediately.  Schedule a complete foot exam at least once a year (annually) or more often if you have foot problems. This information is not intended to replace advice given to you by your health care provider. Make sure you discuss any questions you have with your health care provider. Document Revised: 06/23/2019 Document Reviewed: 11/01/2016 Elsevier Patient Education  2020 [employer]  Corns and Calluses Corns are small areas of thickened skin that occur on the top, sides, or tip of a toe. They contain a cone-shaped core with a point that can press on a nerve below. This causes pain.  Calluses are areas of thickened skin that can occur anywhere on the body, including the hands, fingers, palms, soles of the feet, and heels. Calluses are usually larger than corns. What are the causes? Corns and calluses are caused by rubbing (friction) or pressure, such as from shoes that are too tight or do not fit properly. What increases the risk? Corns are more likely to develop in people who have misshapen toes (toe deformities), such as hammer toes. Calluses can occur with friction to any area of the skin. They are more likely to develop in people who:  Work with their hands.  Wear shoes that fit poorly, are too tight, or are high-heeled.  Have toe deformities. What are the signs or symptoms? Symptoms of a corn or callus include:  A hard growth on the skin.  Pain or tenderness under the skin.  Redness and swelling.  Increased discomfort while wearing tight-fitting shoes, if your feet are affected. If a corn or callus becomes infected, symptoms may include:  Redness and swelling that gets worse.  Pain.  Fluid, blood, or pus draining from the corn or  callus. How is this diagnosed? Corns and calluses may be diagnosed based on your symptoms, your medical history, and a physical exam. How is this treated? Treatment for corns and calluses may include:  Removing the cause of the friction or pressure. This may involve: ? Changing your shoes. ? Wearing shoe inserts (orthotics) or other protective layers in your shoes, such as a corn pad. ? Wearing gloves.  Applying medicine to the skin (topical medicine) to help soften skin in the hardened, thickened areas.  Removing layers of dead skin with a file to reduce the size of the corn or callus.  Removing the corn or callus with a scalpel or laser.  Taking   antibiotic medicines, if your corn or callus is infected.  Having surgery, if a toe deformity is the cause. Follow these instructions at home:   Take over-the-counter and prescription medicines only as told by your health care provider.  If you were prescribed an antibiotic, take it as told by your health care provider. Do not stop taking it even if your condition starts to improve.  Wear shoes that fit well. Avoid wearing high-heeled shoes and shoes that are too tight or too loose.  Wear any padding, protective layers, gloves, or orthotics as told by your health care provider.  Soak your hands or feet and then use a file or pumice stone to soften your corn or callus. Do this as told by your health care provider.  Check your corn or callus every day for symptoms of infection. Contact a health care provider if you:  Notice that your symptoms do not improve with treatment.  Have redness or swelling that gets worse.  Notice that your corn or callus becomes painful.  Have fluid, blood, or pus coming from your corn or callus.  Have new symptoms. Summary  Corns are small areas of thickened skin that occur on the top, sides, or tip of a toe.  Calluses are areas of thickened skin that can occur anywhere on the body, including the  hands, fingers, palms, and soles of the feet. Calluses are usually larger than corns.  Corns and calluses are caused by rubbing (friction) or pressure, such as from shoes that are too tight or do not fit properly.  Treatment may include wearing any padding, protective layers, gloves, or orthotics as told by your health care provider. This information is not intended to replace advice given to you by your health care provider. Make sure you discuss any questions you have with your health care provider. Document Revised: 01/20/2019 Document Reviewed: 08/13/2017 Elsevier Patient Education  2020 Elsevier Inc.  

## 2020-02-15 ENCOUNTER — Encounter (HOSPITAL_COMMUNITY): Payer: Self-pay | Admitting: Psychiatry

## 2020-02-15 ENCOUNTER — Telehealth (INDEPENDENT_AMBULATORY_CARE_PROVIDER_SITE_OTHER): Payer: Medicaid Other | Admitting: Psychiatry

## 2020-02-15 DIAGNOSIS — F4310 Post-traumatic stress disorder, unspecified: Secondary | ICD-10-CM | POA: Diagnosis not present

## 2020-02-15 DIAGNOSIS — F331 Major depressive disorder, recurrent, moderate: Secondary | ICD-10-CM | POA: Diagnosis not present

## 2020-02-15 MED ORDER — LURASIDONE HCL 60 MG PO TABS: 60.0000 mg | ORAL_TABLET | Freq: Every day | ORAL | 2 refills | Status: DC

## 2020-02-15 MED ORDER — DULOXETINE HCL 60 MG PO CPEP: 60.0000 mg | ORAL_CAPSULE | Freq: Every day | ORAL | 2 refills | Status: DC
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
## 2020-02-20 ENCOUNTER — Other Ambulatory Visit: Payer: Self-pay | Admitting: Internal Medicine

## 2020-02-20 DIAGNOSIS — E119 Type 2 diabetes mellitus without complications: Secondary | ICD-10-CM

## 2020-02-21 ENCOUNTER — Ambulatory Visit (INDEPENDENT_AMBULATORY_CARE_PROVIDER_SITE_OTHER): Payer: Medicaid Other | Admitting: Psychology

## 2020-02-21 DIAGNOSIS — F3289 Other specified depressive episodes: Secondary | ICD-10-CM | POA: Diagnosis not present

## 2020-02-28 ENCOUNTER — Ambulatory Visit: Payer: Medicare (Managed Care) | Attending: Internal Medicine | Admitting: Internal Medicine

## 2020-02-28 ENCOUNTER — Other Ambulatory Visit: Payer: Self-pay

## 2020-02-28 DIAGNOSIS — E785 Hyperlipidemia, unspecified: Secondary | ICD-10-CM

## 2020-02-28 DIAGNOSIS — E66813 Obesity, class 3: Secondary | ICD-10-CM

## 2020-02-28 DIAGNOSIS — I10 Essential (primary) hypertension: Secondary | ICD-10-CM | POA: Diagnosis not present

## 2020-02-28 DIAGNOSIS — E1169 Type 2 diabetes mellitus with other specified complication: Secondary | ICD-10-CM | POA: Diagnosis not present

## 2020-02-28 DIAGNOSIS — Z1231 Encounter for screening mammogram for malignant neoplasm of breast: Secondary | ICD-10-CM | POA: Diagnosis not present

## 2020-02-28 DIAGNOSIS — Z6841 Body Mass Index (BMI) 40.0 and over, adult: Secondary | ICD-10-CM

## 2020-02-28 NOTE — Progress Notes (Signed)
Virtual Visit via Telephone Note Due to current restrictions/limitations of in-office visits due to the COVID-19 pandemic, this scheduled clinical appointment was converted to a telehealth visit  I connected with Michele Wood on 02/28/20 at 12:27 p.m by telephone and verified that I am speaking with the correct person using two identifiers. I am in my office.  The patient is at home.  Only the patient and myself participated in this encounter.  I discussed the limitations, risks, security and privacy concerns of performing an evaluation and management service by telephone and the availability of in person appointments. I also discussed with the patient that there may be a patient responsible charge related to this service. The patient expressed understanding and agreed to proceed.   History of Present Illness: Hx of HTN, LT atrial myxoma (s/p minimally invasive surgery 05/2019), minimal CAD on cardiac CT, chronic sinusitis, degen disc of c-spine, chronic LBP, vit D def, obesity, anx/dep, DM (with ? neuropathy in feetfrom DM vs lower back), dep with psychosis.  Last seen 10/2019.  Purpose of today's visit is chronic disease management.  HM: completed Pfizer COVID-19 vaccine series.  Due for MMG and agreeable for referral  DM/Obesity:  Compliant with Metformin Not checking BS regularly.  Last check a week ago when blood sugar was 130 Doing better with eating habits. Walking every day for 20 mins.  Does not feel wgh has change Lab Results  Component Value Date   HGBA1C 5.7 11/01/2019     HTN/CAD: limiting salt and taking Norvasc,HCTZ and Metoprolol No CP/SOB.  Some swelling in feet. No PND Compliant with Lipitor Outpatient Encounter Medications as of 02/28/2020  Medication Sig  . amLODipine (NORVASC) 10 MG tablet   . ASPIRIN LOW DOSE 81 MG EC tablet TAKE 1 TABLET BY MOUTH EVERY DAY  . atorvastatin (LIPITOR) 40 MG tablet TAKE 1 TABLET BY MOUTH EVERY DAY  . Cholecalciferol (VITAMIN  D3) 2000 UNITS TABS Take 2,000 Units by mouth daily. (Patient taking differently: Take 2,000 Units by mouth 2 (two) times a week. )  . DULoxetine (CYMBALTA) 60 MG capsule Take 1 capsule (60 mg total) by mouth daily.  . hydrochlorothiazide (HYDRODIURIL) 12.5 MG tablet TAKE 1 TABLET BY MOUTH EVERY DAY  . hydrOXYzine (VISTARIL) 25 MG capsule Take 1 capsule (25 mg total) by mouth at bedtime as needed for anxiety.  . Lurasidone HCl 60 MG TABS Take 1 tablet (60 mg total) by mouth daily with breakfast.  . metFORMIN (GLUCOPHAGE) 500 MG tablet TAKE 1 TABLET BY MOUTH EVERY DAY WITH BREAKFAST  . methocarbamol (ROBAXIN) 750 MG tablet Take 1 tablet (750 mg total) by mouth every 6 (six) hours as needed for muscle spasms.  . metoprolol tartrate (LOPRESSOR) 50 MG tablet TAKE 1 TABLET BY MOUTH TWICE A DAY  . montelukast (SINGULAIR) 10 MG tablet Take 1 tablet (10 mg total) by mouth at bedtime. (Patient taking differently: Take 10 mg by mouth daily. )  . mupirocin ointment (BACTROBAN) 2 % APPLY NASALLY TWICE A DAY FOR 5 DAYS  . traMADol (ULTRAM) 50 MG tablet   . triamcinolone (NASACORT AQ) 55 MCG/ACT AERO nasal inhaler Place 2 sprays into the nose daily.   No facility-administered encounter medications on file as of 02/28/2020.    Observations/Objective: No direct observation done  Lab Results  Component Value Date   CHOL 154 05/13/2019   HDL 34 (L) 05/13/2019   LDLCALC 82 05/13/2019   TRIG 191 (H) 05/13/2019   CHOLHDL 4.5 (H) 05/13/2019  Assessment and Plan: 1. Type 2 diabetes mellitus with morbid obesity ([place]) Continue healthy eating habits and regular exercise.  Continue Metformin.  She will come to the lab for A1c check. - Hemoglobin A1c  2. Essential hypertension Continue current blood pressure medications and low-salt diet.  3. Class 3 severe obesity due to excess calories with serious comorbidity and body mass index (BMI) of 40.0 to 44.9 in adult [facility]) See #1 above  4. Hyperlipidemia  associated with type 2 diabetes mellitus ([facility]) Continue atorvastatin - Lipid panel; Future  5. Encounter for screening mammogram for malignant neoplasm of breast - MM Digital Screening; Future   Follow Up Instructions: 4 mths   I discussed the assessment and treatment plan with the patient. The patient was provided an opportunity to ask questions and all were answered. The patient agreed with the plan and demonstrated an understanding of the instructions.   The patient was advised to call back or seek an in-person evaluation if the symptoms worsen or if the condition fails to improve as anticipated.  I provided 8 minutes of non-face-to-face time during this encounter.   Karle Plumber, MD

## 2020-03-09 ENCOUNTER — Other Ambulatory Visit: Payer: Self-pay | Admitting: Internal Medicine

## 2020-03-09 DIAGNOSIS — I10 Essential (primary) hypertension: Secondary | ICD-10-CM

## 2020-03-13 ENCOUNTER — Ambulatory Visit (INDEPENDENT_AMBULATORY_CARE_PROVIDER_SITE_OTHER): Payer: Medicaid Other | Admitting: Psychology

## 2020-03-13 DIAGNOSIS — F3289 Other specified depressive episodes: Secondary | ICD-10-CM | POA: Diagnosis not present

## 2020-04-05 ENCOUNTER — Ambulatory Visit
Admission: RE | Admit: 2020-04-05 | Discharge: 2020-04-05 | Disposition: A | Discharge status: Home / Self Care | Payer: Medicare (Managed Care) | Source: Ambulatory Visit | Attending: Internal Medicine | Admitting: Internal Medicine

## 2020-04-05 ENCOUNTER — Other Ambulatory Visit: Payer: Self-pay

## 2020-04-05 ENCOUNTER — Ambulatory Visit (INDEPENDENT_AMBULATORY_CARE_PROVIDER_SITE_OTHER): Payer: Medicaid Other | Admitting: Psychology

## 2020-04-05 DIAGNOSIS — F3289 Other specified depressive episodes: Secondary | ICD-10-CM

## 2020-04-05 DIAGNOSIS — Z1231 Encounter for screening mammogram for malignant neoplasm of breast: Secondary | ICD-10-CM

## 2020-05-02 ENCOUNTER — Ambulatory Visit (INDEPENDENT_AMBULATORY_CARE_PROVIDER_SITE_OTHER): Payer: Medicaid Other | Admitting: Psychology

## 2020-05-02 DIAGNOSIS — F3289 Other specified depressive episodes: Secondary | ICD-10-CM

## 2020-05-16 ENCOUNTER — Ambulatory Visit (INDEPENDENT_AMBULATORY_CARE_PROVIDER_SITE_OTHER): Payer: Medicare (Managed Care) | Admitting: Podiatry

## 2020-05-16 ENCOUNTER — Encounter: Payer: Self-pay | Admitting: Podiatry

## 2020-05-16 ENCOUNTER — Other Ambulatory Visit: Payer: Self-pay

## 2020-05-16 DIAGNOSIS — E119 Type 2 diabetes mellitus without complications: Secondary | ICD-10-CM | POA: Diagnosis not present

## 2020-05-16 DIAGNOSIS — B351 Tinea unguium: Secondary | ICD-10-CM

## 2020-05-16 DIAGNOSIS — M79674 Pain in right toe(s): Secondary | ICD-10-CM

## 2020-05-16 DIAGNOSIS — M79675 Pain in left toe(s): Secondary | ICD-10-CM | POA: Diagnosis not present

## 2020-05-16 NOTE — Patient Instructions (Signed)
How to Use Compression Stockings Compression stockings are elastic socks that squeeze the legs. They help increase blood flow (circulation) to the legs, decrease swelling in the legs, and reduce the chance of developing blood clots in the lower legs. Compression stockings are often used by people who:  Are recovering from surgery.  Have poor circulation in their legs.  Tend to get blood clots in their legs.  Have bulging (varicose) veins.  Sit or stay in bed for long periods of time. Follow instructions from your health care provider about how and when to wear your compression stockings. How to wear compression stockings Before you put on your compression stockings:  Make sure that they are the correct size and degree of compression. If you do not know your size or required grade of compression, ask your health care provider and follow the manufacturer's instructions that come with the stockings.  Make sure that they are clean, dry, and in good condition.  Check them for rips and tears. Do not put them on if they are ripped or torn. Put your stockings on first thing in the morning, before you get out of bed. Keep them on for as long as your health care provider advises. When you are wearing your stockings:  Keep them as smooth as possible. Do not allow them to bunch up. It is especially important to prevent the stockings from bunching up around your toes or behind your knees.  Do not roll the stockings downward and leave them rolled down. This can decrease blood flow to your leg.  Change them right away if they become wet or dirty. When you take off your stockings, inspect your legs and feet. Check for:  Open sores.  Red spots.  Swelling. General tips  Do not stop wearing compression stockings without talking to your health care provider first.  Wash your stockings every day with mild detergent in cold or warm water. Do not use bleach. Air-dry your stockings or dry them in a  clothes dryer on low heat. It may be helpful to have two pairs so that you have a pair to wear while the other is being washed.  Replace your stockings every 3-6 months.  If skin moisturizing is part of your treatment plan, apply lotion or cream at night so that your skin will be dry when you put on the stockings in the morning. It is harder to put the stockings on when you have lotion on your legs or feet.  Wear nonskid shoes or slip-resistant socks when walking while wearing compression stockings. Contact a health care provider and remove your stockings if you have:  A feeling of pins and needles in your feet or legs.  Open sores, red spots, or other skin changes on your feet or legs.  Swelling or pain that gets worse. Get help right away if you have:  Numbness or tingling in your lower legs that does not get better right after you take the stockings off.  Toes or feet that are unusually cold or turn a bluish color.  A warm or red area on your leg.  New swelling or soreness in your leg.  Shortness of breath.  Chest pain.  A fast or irregular heartbeat.  Light-headedness.  Dizziness. Summary  Compression stockings are elastic socks that squeeze the legs.  They help increase blood flow (circulation) to the legs, decrease swelling in the legs, and reduce the chance of developing blood clots in the lower legs.  Follow instructions  from your health care provider about how and when to wear your compression stockings.  Do not stop wearing your compression stockings without talking to your health care provider first. This information is not intended to replace advice given to you by your health care provider. Make sure you discuss any questions you have with your health care provider. Document Revised: 10/02/2017 Document Reviewed: 10/02/2017 Elsevier Patient Education  [place].   Edema  Edema is an abnormal buildup of fluids in the body tissues and under the  skin. Swelling of the legs, feet, and ankles is a common symptom that becomes more likely as you get older. Swelling is also common in looser tissues, like around the eyes. When the affected area is squeezed, the fluid may move out of that spot and leave a dent for a few moments. This dent is called pitting edema. There are many possible causes of edema. Eating too much salt (sodium) and being on your feet or sitting for a long time can cause edema in your legs, feet, and ankles. Hot weather may make edema worse. Common causes of edema include:  Heart failure.  Liver or kidney disease.  Weak leg blood vessels.  Cancer.  An injury.  Pregnancy.  Medicines.  Being obese.  Low protein levels in the blood. Edema is usually painless. Your skin may look swollen or shiny. Follow these instructions at home:  Keep the affected body part raised (elevated) above the level of your heart when you are sitting or lying down.  Do not sit still or stand for long periods of time.  Do not wear tight clothing. Do not wear garters on your upper legs.  Exercise your legs to get your circulation going. This helps to move the fluid back into your blood vessels, and it may help the swelling go down.  Wear elastic bandages or support stockings to reduce swelling as told by your health care provider.  Eat a low-salt (low-sodium) diet to reduce fluid as told by your health care provider.  Depending on the cause of your swelling, you may need to limit how much fluid you drink (fluid restriction).  Take over-the-counter and prescription medicines only as told by your health care provider. Contact a health care provider if:  Your edema does not get better with treatment.  You have heart, liver, or kidney disease and have symptoms of edema.  You have sudden and unexplained weight gain. Get help right away if:  You develop shortness of breath or chest pain.  You cannot breathe when you lie  down.  You develop pain, redness, or warmth in the swollen areas.  You have heart, liver, or kidney disease and suddenly get edema.  You have a fever and your symptoms suddenly get worse. Summary  Edema is an abnormal buildup of fluids in the body tissues and under the skin.  Eating too much salt (sodium) and being on your feet or sitting for a long time can cause edema in your legs, feet, and ankles.  Keep the affected body part raised (elevated) above the level of your heart when you are sitting or lying down. This information is not intended to replace advice given to you by your health care provider. Make sure you discuss any questions you have with your health care provider. Document Revised: 02/17/2019 Document Reviewed: 11/02/2016 Elsevier Patient Education  [place].

## 2020-05-16 NOTE — Progress Notes (Signed)
Subjective: Michele Wood presents today for follow up of preventative diabetic foot care and callus(es) right hallux and painful mycotic toenails b/l that are difficult to trim. Pain interferes with ambulation. Aggravating factors include wearing enclosed shoe gear. Pain is relieved with periodic professional debridement..  She voices no new pedal concerns on today's visit.   Allergies  Allergen Reactions   Codeine Sulfate Rash   Milk-Related Compounds Diarrhea and Other (See Comments)    Diarrhea and stomach upset     Objective: There were no vitals filed for this visit.  Pt is a pleasant 59 y.o. year old [name] female, obese,  in NAD. AAO x 3.   Vascular Examination:  Capillary refill time to digits immediate b/l. Palpable DP pulses b/l. Palpable PT pulses b/l. Pedal hair sparse b/l. Skin temperature gradient within normal limits b/l. No edema noted b/l.  Dermatological Examination: Pedal skin with normal turgor, texture and tone bilaterally. No open wounds bilaterally. No interdigital macerations bilaterally. Toenails 1-5 b/l elongated, dystrophic, thickened, crumbly with subungual debris and tenderness to dorsal palpation. Hyperkeratotic lesion(s) R hallux.  No erythema, no edema, no drainage, no flocculence.  Musculoskeletal: Normal muscle strength 5/5 to all lower extremity muscle groups bilaterally. No gross bony deformities bilaterally. No pain crepitus or joint limitation noted with ROM b/l. Utilizes cane for ambulation assistance.  Neurological: Protective sensation intact 5/5 intact bilaterally with 10g monofilament b/l. Vibratory sensation intact b/l.  Assessment: 1. Pain due to onychomycosis of toenails of both feet   2. Controlled type 2 diabetes mellitus without complication, without long-term current use of insulin ([place])    Plan: -Medicaid ABN signed for this year. Copy given to patient on today's visit and copy placed in patient chart. -Continue diabetic foot  care principles.Patient declined paring of callus and said she would use a pumice stone. -Toenails 1-5 b/l were debrided in length and girth with sterile nail nippers and dremel without iatrogenic bleeding.  -Patient to report any pedal injuries to medical professional immediately. -Patient to continue soft, supportive shoe gear daily. -Patient/POA to call should there be question/concern in the interim.  Return in about 3 months (around 08/16/2020).  Marzetta Board, DPM

## 2020-05-17 ENCOUNTER — Telehealth (INDEPENDENT_AMBULATORY_CARE_PROVIDER_SITE_OTHER): Payer: Medicare (Managed Care) | Admitting: Psychiatry

## 2020-05-17 ENCOUNTER — Encounter (HOSPITAL_COMMUNITY): Payer: Self-pay | Admitting: Psychiatry

## 2020-05-17 DIAGNOSIS — F331 Major depressive disorder, recurrent, moderate: Secondary | ICD-10-CM | POA: Diagnosis not present

## 2020-05-17 DIAGNOSIS — F4310 Post-traumatic stress disorder, unspecified: Secondary | ICD-10-CM

## 2020-05-17 MED ORDER — HYDROXYZINE PAMOATE 25 MG PO CAPS: 25.0000 mg | ORAL_CAPSULE | Freq: Every evening | ORAL | 2 refills | Status: DC | PRN

## 2020-05-17 MED ORDER — DULOXETINE HCL 60 MG PO CPEP: 60.0000 mg | ORAL_CAPSULE | Freq: Every day | ORAL | 2 refills | Status: DC

## 2020-05-17 MED ORDER — LURASIDONE HCL 60 MG PO TABS: 60.0000 mg | ORAL_TABLET | Freq: Every day | ORAL | 2 refills | Status: DC
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
## 2020-05-20 ENCOUNTER — Other Ambulatory Visit: Payer: Self-pay | Admitting: Internal Medicine

## 2020-05-20 DIAGNOSIS — E119 Type 2 diabetes mellitus without complications: Secondary | ICD-10-CM

## 2020-05-20 NOTE — Telephone Encounter (Signed)
Requested medication (s) are due for refill today: yes  Requested medication (s) are on the active medication list: yes  Last refill:  02/21/20  Future visit scheduled: no  Notes to clinic:  overdue for lab work   Requested Prescriptions  Pending Prescriptions Disp Refills   metFORMIN (GLUCOPHAGE) 500 MG tablet [Pharmacy Med Name: METFORMIN HCL 500 MG TABLET] 90 tablet 0    Sig: TAKE 1 TABLET BY [place]      Endocrinology:  Diabetes - Biguanides Failed - 05/20/2020  8:59 AM      Failed - HBA1C is between 0 and 7.9 and within 180 days    HbA1c, POC (prediabetic range)  Date Value Ref Range Status  11/01/2019 5.7 5.7 - 6.4 % Final          Passed - Cr in normal range and within 360 days    Creat  Date Value Ref Range Status  04/25/2015 0.85 0.50 - 1.10 mg/dL Final   Creatinine, Ser  Date Value Ref Range Status  11/05/2019 0.86 0.57 - 1.00 mg/dL Final          Passed - eGFR in normal range and within 360 days    GFR, Est African American  Date Value Ref Range Status  04/25/2015 >89 mL/min Final   GFR calc Af Amer  Date Value Ref Range Status  11/05/2019 87 >59 mL/min/1.73 Final   GFR, Est Non African American  Date Value Ref Range Status  04/25/2015 79 mL/min Final    Comment:      The estimated GFR is a calculation valid for adults (>=72 years old) that uses the CKD-EPI algorithm to adjust for age and sex. It is   not to be used for children, pregnant women, hospitalized patients,    patients on dialysis, or with rapidly changing kidney function. According to the NKDEP, eGFR >89 is normal, 60-89 shows mild impairment, 30-59 shows moderate impairment, 15-29 shows severe impairment and <15 is ESRD.      GFR calc non Af Amer  Date Value Ref Range Status  11/05/2019 75 >59 mL/min/1.73 Final          Passed - Valid encounter within last 6 months    Recent Outpatient Visits           2 months ago Type 2 diabetes mellitus with morbid  obesity ([place])   [place] Fonda, Neoma Laming B, MD   6 months ago Controlled type 2 diabetes mellitus without complication, without long-term current use of insulin ([place])   [place], Deborah B, MD   1 year ago Controlled type 2 diabetes mellitus without complication, without long-term current use of insulin ([place])   [place], Deborah B, MD   1 year ago Controlled type 2 diabetes mellitus without complication, without long-term current use of insulin [facility])   [place], Deborah B, MD   1 year ago Controlled type 2 diabetes mellitus without complication, without long-term current use of insulin [facility])   [place], Deborah B, MD       Future Appointments             In 2 weeks [name], MD [facility] [place], [place]

## 2020-05-22 ENCOUNTER — Ambulatory Visit (INDEPENDENT_AMBULATORY_CARE_PROVIDER_SITE_OTHER): Payer: Medicaid Other | Admitting: Psychology

## 2020-05-22 DIAGNOSIS — F3289 Other specified depressive episodes: Secondary | ICD-10-CM

## 2020-06-05 ENCOUNTER — Encounter: Payer: Self-pay | Admitting: Thoracic Surgery (Cardiothoracic Vascular Surgery)

## 2020-06-05 ENCOUNTER — Other Ambulatory Visit: Payer: Self-pay

## 2020-06-05 ENCOUNTER — Telehealth (INDEPENDENT_AMBULATORY_CARE_PROVIDER_SITE_OTHER): Payer: Medicare (Managed Care) | Admitting: Thoracic Surgery (Cardiothoracic Vascular Surgery)

## 2020-06-05 DIAGNOSIS — D151 Benign neoplasm of heart: Secondary | ICD-10-CM | POA: Diagnosis not present

## 2020-06-05 NOTE — Progress Notes (Signed)
[address]       [place] [address]             [phone]     CARDIOTHORACIC SURGERY TELEPHONE VIRTUAL OFFICE NOTE  Referring Provider is Nahser, Wonda Cheng, MD Primary Cardiologist is Quay Burow, MD PCP is Ladell Pier, MD   HPI:  I spoke with Michele Wood (DOB [birth year] ) via telephone on 06/05/2020 at 3:49 PM and verified that I was speaking with the correct person using more than one form of identification.  We discussed the fact that I was contacting them from my office and they were located at home, as well as the reason(s) for conducting our visit virtually instead of in-person.  The patient expressed understanding the circumstances and agreed to proceed as described.   Patient is a 59 year old morbidly obese female with history of hypertension, obstructive sleep apnea, degenerative disc disease of the cervical and lumbar spine status post recent spine surgery, and type 2 diabetes mellitus without complications who presented with atypical chest pain and was found to have left atrial mass.  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    I spoke with the patient over the telephone this afternoon and she reports that she continues to do very well.  She states that she completely recovered from her heart surgery and she has not had any sort of pains in her chest at all.  She states that she feels as though her breathing is considerably improved since her atrial myxoma was removed.  She remains overweight but she has been trying to exercise more.  She does not have problems with shortness of breath and she has not had any sort of chest pain.  She denies any palpitations or dizzy spells.  She states that her diabetes has been under reasonably good control.  She does not check her blood pressure regularly at home.  Overall she is pleased with her outcome and she has no questions.   Current Outpatient  Medications  Medication Sig Dispense Refill  . amLODipine (NORVASC) 10 MG tablet     . ASPIRIN LOW DOSE 81 MG EC tablet TAKE 1 TABLET BY MOUTH EVERY DAY 120 tablet 1  . atorvastatin (LIPITOR) 40 MG tablet TAKE 1 TABLET BY MOUTH EVERY DAY 90 tablet 3  . Cholecalciferol (VITAMIN D3) 2000 UNITS TABS Take 2,000 Units by mouth daily. (Patient taking differently: Take 2,000 Units by mouth 2 (two) times a week. ) 30 tablet 11  . DULoxetine (CYMBALTA) 60 MG capsule Take 1 capsule (60 mg total) by mouth daily. 30 capsule 2  . hydrochlorothiazide (HYDRODIURIL) 12.5 MG tablet TAKE 1 TABLET BY MOUTH EVERY DAY 90 tablet 3  . hydrOXYzine (VISTARIL) 25 MG capsule Take 1 capsule (25 mg total) by mouth at bedtime as needed for anxiety. 30 capsule 2  . Lurasidone HCl 60 MG TABS Take 1 tablet (60 mg total) by mouth daily with breakfast. 30 tablet 2  . metFORMIN (GLUCOPHAGE) 500 MG tablet TAKE 1 TABLET BY MOUTH EVERY DAY WITH BREAKFAST 90 tablet 0  . methocarbamol (ROBAXIN) 750 MG tablet Take 1 tablet (750 mg total) by mouth every 6 (six) hours as needed for muscle spasms. 50 tablet 1  . metoprolol tartrate (LOPRESSOR) 50 MG tablet TAKE 1 TABLET BY MOUTH TWICE A DAY 60 tablet 2  . montelukast (SINGULAIR) 10 MG tablet Take 1 tablet (10 mg total) by mouth  at bedtime. (Patient taking differently: Take 10 mg by mouth daily. ) 30 tablet 3  . mupirocin ointment (BACTROBAN) 2 % APPLY NASALLY TWICE A DAY FOR 5 DAYS    . traMADol (ULTRAM) 50 MG tablet     . triamcinolone (NASACORT AQ) 55 MCG/ACT AERO nasal inhaler Place 2 sprays into the nose daily. 1 Inhaler 12   No current facility-administered medications for this visit.     Diagnostic Tests:  n/a   Impression:  Patient is doing well approximately 1 year status post resection of left atrial myxoma  Plan:  We have not recommended any change the patient's medical therapy.  The patient will continue to follow-up intermittently with her primary cardiologist and  primary care physician.  She will call return to our office in the future only should specific problems or questions arise.    I discussed limitations of evaluation and management via telephone.  The patient was advised to call back for repeat telephone consultation or to seek an in-person evaluation if questions arise or the patient's clinical condition changes in any significant manner.  I spent in excess of 5 minutes of non-face-to-face time during the conduct of this telephone virtual office consultation, including pre-visit review of the patient's records and direct conversation with the patient.      Valentina Gu. Roxy Manns, MD 06/05/2020 3:49 PM

## 2020-06-12 ENCOUNTER — Ambulatory Visit (INDEPENDENT_AMBULATORY_CARE_PROVIDER_SITE_OTHER): Payer: Medicaid Other | Admitting: Psychology

## 2020-06-12 DIAGNOSIS — F3289 Other specified depressive episodes: Secondary | ICD-10-CM

## 2020-07-05 ENCOUNTER — Ambulatory Visit (INDEPENDENT_AMBULATORY_CARE_PROVIDER_SITE_OTHER): Payer: Medicare (Managed Care) | Admitting: Psychology

## 2020-07-05 DIAGNOSIS — F3289 Other specified depressive episodes: Secondary | ICD-10-CM

## 2020-07-14 ENCOUNTER — Other Ambulatory Visit (HOSPITAL_COMMUNITY): Payer: Self-pay | Admitting: Psychiatry

## 2020-07-14 DIAGNOSIS — F4310 Post-traumatic stress disorder, unspecified: Secondary | ICD-10-CM

## 2020-07-17 ENCOUNTER — Other Ambulatory Visit: Payer: Self-pay | Admitting: Internal Medicine

## 2020-07-17 DIAGNOSIS — I10 Essential (primary) hypertension: Secondary | ICD-10-CM

## 2020-07-19 ENCOUNTER — Encounter: Payer: Self-pay | Admitting: Cardiology

## 2020-07-19 ENCOUNTER — Telehealth (INDEPENDENT_AMBULATORY_CARE_PROVIDER_SITE_OTHER): Payer: Medicare (Managed Care) | Admitting: Cardiology

## 2020-07-19 VITALS — Ht 62.0 in

## 2020-07-19 DIAGNOSIS — I10 Essential (primary) hypertension: Secondary | ICD-10-CM

## 2020-07-19 DIAGNOSIS — Z86018 Personal history of other benign neoplasm: Secondary | ICD-10-CM

## 2020-07-19 DIAGNOSIS — I2510 Atherosclerotic heart disease of native coronary artery without angina pectoris: Secondary | ICD-10-CM

## 2020-07-19 NOTE — Progress Notes (Signed)
Unable to contact patient.  Kerin Ransom PA-C 07/19/2020 3:20 PM

## 2020-07-26 ENCOUNTER — Ambulatory Visit (INDEPENDENT_AMBULATORY_CARE_PROVIDER_SITE_OTHER): Payer: Medicare (Managed Care) | Admitting: Psychology

## 2020-07-26 DIAGNOSIS — F3289 Other specified depressive episodes: Secondary | ICD-10-CM

## 2020-08-14 ENCOUNTER — Other Ambulatory Visit (HOSPITAL_COMMUNITY): Payer: Self-pay | Admitting: Psychiatry

## 2020-08-14 DIAGNOSIS — F4310 Post-traumatic stress disorder, unspecified: Secondary | ICD-10-CM

## 2020-08-15 ENCOUNTER — Other Ambulatory Visit: Payer: Self-pay | Admitting: Internal Medicine

## 2020-08-15 DIAGNOSIS — E119 Type 2 diabetes mellitus without complications: Secondary | ICD-10-CM

## 2020-08-16 ENCOUNTER — Encounter: Payer: Self-pay | Admitting: Podiatry

## 2020-08-16 ENCOUNTER — Ambulatory Visit (INDEPENDENT_AMBULATORY_CARE_PROVIDER_SITE_OTHER): Payer: Medicare (Managed Care) | Admitting: Podiatry

## 2020-08-16 ENCOUNTER — Other Ambulatory Visit: Payer: Self-pay

## 2020-08-16 DIAGNOSIS — M79675 Pain in left toe(s): Secondary | ICD-10-CM

## 2020-08-16 DIAGNOSIS — E119 Type 2 diabetes mellitus without complications: Secondary | ICD-10-CM | POA: Diagnosis not present

## 2020-08-16 DIAGNOSIS — B351 Tinea unguium: Secondary | ICD-10-CM | POA: Diagnosis not present

## 2020-08-16 DIAGNOSIS — L84 Corns and callosities: Secondary | ICD-10-CM | POA: Diagnosis not present

## 2020-08-16 DIAGNOSIS — M79674 Pain in right toe(s): Secondary | ICD-10-CM | POA: Diagnosis not present

## 2020-08-16 NOTE — Progress Notes (Signed)
ANNUAL DIABETIC FOOT EXAM  Subjective: Michele Wood presents today for for annual diabetic foot examination and painful thick toenails that are difficult to trim. Pain interferes with ambulation. Aggravating factors include wearing enclosed shoe gear. Pain is relieved with periodic professional debridement..  Patient denies any h/o foot wounds.  Patient denies symptoms of foot numbness.  Patient denies symptoms of foot tingling.  Patient denies symptoms of burning in feet.  She did not check her blood glucose this morning.   Ladell Pier, MD is patient's PCP. Last visit was 02/28/2020.  Past Medical History:  Diagnosis Date  . Chronic back pain   . DDD (degenerative disc disease), cervical   . Depression   . Diabetes mellitus without complication ([place])   . Hypertension    at age 54  . Obesity   . Pancreatic cyst 05/31/2019   Incidental - discovered on CT scan  . s/p minimally-invasive resection of left atrial myxoma 06/08/2019  . Sinusitis   . Sleep apnea    at age 65    Patient Active Problem List   Diagnosis Date Noted  . Hyperlipidemia associated with type 2 diabetes mellitus ([place]) 02/28/2020  . Hypotension 07/19/2019  . CAD (coronary artery disease) 06/23/2019  . S/P minimally-invasive resection of left atrial myxoma 06/08/2019  . Pancreatic cyst 05/31/2019  . Spondylolisthesis of lumbar region 03/31/2019  . Atrial myxoma 03/19/2019  . Dyslipidemia, goal LDL below 70 12/16/2018  . Family history of heart disease 12/16/2018  . Left bundle branch block 12/16/2018  . History of chest pain 12/16/2018  . MDD (major depressive disorder), recurrent episode, moderate ([place]) 10/19/2018  . Non-insulin treated type 2 diabetes mellitus ([place]) 12/12/2017  . Morbid obesity ([place]) 09/12/2017  . Anxiety and depression 06/13/2017  . Cervical radiculopathy due to intervertebral disc disorder 06/13/2017  . Chronic low back pain without sciatica 09/18/2015  . DJD  (degenerative joint disease), lumbar 06/08/2015  . Vitamin D insufficiency 04/25/2014  . HTN (hypertension) 06/21/2008    Past Surgical History:  Procedure Laterality Date  . ABDOMINAL HYSTERECTOMY    . BACK SURGERY    . BREAST BIOPSY Right 07/02/2006   [place] Core benign  . MINIMALLY INVASIVE EXCISION OF ATRIAL MYXOMA N/A 06/08/2019   Procedure: MINIMALLY INVASIVE RESECTION OF LEFT ATRIAL MYXOMA;  Surgeon: [name], MD;  Location: [facility];  Service: Open Heart Surgery;  Laterality: N/A;  . NASAL SINUS SURGERY    . TEE WITHOUT CARDIOVERSION N/A 05/17/2019   Procedure: TRANSESOPHAGEAL ECHOCARDIOGRAM (TEE);  Surgeon: Acie Fredrickson Wonda Cheng, MD;  Location: [facility];  Service: Cardiovascular;  Laterality: N/A;  . TEE WITHOUT CARDIOVERSION N/A 06/08/2019   Procedure: TRANSESOPHAGEAL ECHOCARDIOGRAM (TEE);  Surgeon: [name], MD;  Location: [facility];  Service: Open Heart Surgery;  Laterality: N/A;    Current Outpatient Medications on File Prior to Visit  Medication Sig Dispense Refill  . amLODipine (NORVASC) 10 MG tablet     . ASPIRIN LOW DOSE 81 MG EC tablet TAKE 1 TABLET BY MOUTH EVERY DAY 120 tablet 1  . atorvastatin (LIPITOR) 40 MG tablet TAKE 1 TABLET BY MOUTH EVERY DAY 90 tablet 3  . Cholecalciferol (VITAMIN D3) 2000 UNITS TABS Take 2,000 Units by mouth daily. (Patient taking differently: Take 2,000 Units by mouth 2 (two) times a week. ) 30 tablet 11  . metFORMIN (GLUCOPHAGE) 500 MG tablet TAKE 1 TABLET BY MOUTH EVERY DAY WITH BREAKFAST 90 tablet 0  . methocarbamol (ROBAXIN) 750 MG tablet Take  1 tablet (750 mg total) by mouth every 6 (six) hours as needed for muscle spasms. 50 tablet 1  . metoprolol tartrate (LOPRESSOR) 50 MG tablet TAKE 1 TABLET BY MOUTH TWICE A DAY 180 tablet 0  . montelukast (SINGULAIR) 10 MG tablet Take 1 tablet (10 mg total) by mouth at bedtime. (Patient taking differently: Take 10 mg by mouth daily. ) 30 tablet 3  . mupirocin ointment (BACTROBAN) 2 % APPLY NASALLY  TWICE A DAY FOR 5 DAYS    . traMADol (ULTRAM) 50 MG tablet     . triamcinolone (NASACORT AQ) 55 MCG/ACT AERO nasal inhaler Place 2 sprays into the nose daily. 1 Inhaler 12   No current facility-administered medications on file prior to visit.     Allergies  Allergen Reactions  . Codeine Sulfate Rash  . Milk-Related Compounds Diarrhea and Other (See Comments)    Diarrhea and stomach upset    Social History   Occupational History  . Not on file  Tobacco Use  . Smoking status: Never Smoker  . Smokeless tobacco: Never Used  Vaping Use  . Vaping Use: Never used  Substance and Sexual Activity  . Alcohol use: Yes    Alcohol/week: 0.0 standard drinks    Comment: rare  . Drug use: No  . Sexual activity: Not Currently    Family History  Problem Relation Age of Onset  . Hypertension Mother   . Hypertension Other   . Schizophrenia Brother     Immunization History  Administered Date(s) Administered  . Influenza Inj Mdck Quad With Preservative 08/27/2019  . Influenza Whole 09/13/2008, 08/29/2019  . Influenza,inj,Quad PF,6+ Mos 09/22/2014, 09/18/2015, 08/22/2016, 06/13/2017, 06/19/2018, 08/18/2020  . PFIZER SARS-COV-2 Vaccination 01/25/2020, 02/22/2020  . Pneumococcal Polysaccharide-23 10/19/2018  . Tdap 09/03/2016     Objective: There were no vitals filed for this visit.  Michele Wood is a pleasant 59 y.o. African American female, morbidly obese in NAD. AAO X 3.  Vascular Examination: Capillary refill time to digits immediate b/l. Palpable pedal pulses b/l LE. Pedal hair sparse. Lower extremity skin temperature gradient within normal limits.  Dermatological Examination: Pedal skin with normal turgor, texture and tone bilaterally. No open wounds bilaterally. No interdigital macerations bilaterally. Toenails 1-5 b/l elongated, discolored, dystrophic, thickened, crumbly with subungual debris and tenderness to dorsal palpation.  Musculoskeletal Examination: Normal  muscle strength 5/5 to all lower extremity muscle groups bilaterally. No pain crepitus or joint limitation noted with ROM b/l. No gross bony deformities bilaterally. Utilizes cane for ambulation assistance.  Footwear Assessment: Does the patient wear appropriate shoes? Yes. Does the patient need inserts/orthotics? No.  Neurological Examination: Protective sensation intact 5/5 intact bilaterally with 10g monofilament b/l. Vibratory sensation intact b/l. Proprioception intact bilaterally.  Hemoglobin A1C Latest Ref Rng & Units 08/18/2020 11/01/2019  HGBA1C 0.0 - 7.0 % 6.0 5.7  Some recent data might be hidden   Assessment: 1. Pain due to onychomycosis of toenails of both feet   2. Callus   3. Controlled type 2 diabetes mellitus without complication, without long-term current use of insulin ([place])   4. Encounter for diabetic foot exam ([place])     ADA Risk Categorization: Low Risk :  Patient has all of the following: Intact protective sensation No prior foot ulcer  No severe deformity Pedal pulses present  Plan: -Examined patient. -Medicaid ABN signed for this year. Patient refuses services of callus paring of left hallux today. Copy given to patient on today's visit and copy placed in patient  chart. -Diabetic foot examination performed on today's visit. -Continue diabetic foot care principles. -Patient to continue soft, supportive shoe gear daily. -Toenails 1-5 b/l were debrided in length and girth with sterile nail nippers and dremel without iatrogenic bleeding.  -Patient/POA to call should there be question/concern in the interim.  Return in about 3 months (around 11/16/2020) for diabetic foot care.  Marzetta Board, DPM

## 2020-08-17 ENCOUNTER — Encounter (HOSPITAL_COMMUNITY): Payer: Self-pay | Admitting: Psychiatry

## 2020-08-17 ENCOUNTER — Telehealth (INDEPENDENT_AMBULATORY_CARE_PROVIDER_SITE_OTHER): Payer: Medicare (Managed Care) | Admitting: Psychiatry

## 2020-08-17 ENCOUNTER — Other Ambulatory Visit: Payer: Self-pay | Admitting: Internal Medicine

## 2020-08-17 DIAGNOSIS — F4310 Post-traumatic stress disorder, unspecified: Secondary | ICD-10-CM | POA: Diagnosis not present

## 2020-08-17 DIAGNOSIS — F331 Major depressive disorder, recurrent, moderate: Secondary | ICD-10-CM

## 2020-08-17 DIAGNOSIS — E119 Type 2 diabetes mellitus without complications: Secondary | ICD-10-CM

## 2020-08-17 MED ORDER — DULOXETINE HCL 60 MG PO CPEP: 60.0000 mg | ORAL_CAPSULE | Freq: Every day | ORAL | 2 refills | Status: DC

## 2020-08-17 MED ORDER — LURASIDONE HCL 60 MG PO TABS: 60.0000 mg | ORAL_TABLET | Freq: Every day | ORAL | 2 refills | Status: DC

## 2020-08-17 MED ORDER — HYDROXYZINE PAMOATE 25 MG PO CAPS: 25.0000 mg | ORAL_CAPSULE | Freq: Every evening | ORAL | 2 refills | Status: DC | PRN

## 2020-08-17 NOTE — Progress Notes (Signed)
Virtual Visit via Telephone Note  I connected with Michele Wood on 08/17/20 at 11:20 AM EDT by telephone and verified that I am speaking with the correct person using two identifiers.  Location: Patient: Home Provider: Home Office   I discussed the limitations, risks, security and privacy concerns of performing an evaluation and management service by telephone and the availability of in person appointments. I also discussed with the patient that there may be a patient responsible charge related to this service. The patient expressed understanding and agreed to proceed.   History of Present Illness: Patient is evaluated by phone session.  She is taking all her medication and she feels her depression is stable.  She is sleeping good with the hydroxyzine denies any recent nightmares or any flashbacks.  She is happy since husband is working and helping to pay the bills.  She had not seen her physician in recent months but realized that she need to schedule appointment for blood work.  She has diabetes and she checks her blood sugar on and off.  She is excited about Thanksgiving because her sister is coming and it is possible that her brother also comes.  Patient lives with her husband and 2 son.  Her son is also working.  Patient does not want to change the medication since it is working well.  She has no tremors shakes or any EPS.  She denies any paranoia, hallucination or any suicidal thoughts.  Past Psychiatric History:Reviewed. H/Oparanoia, hallucination, depression since childhood. Witnessed Therapist, music. H/Onightmare and flashback. TriedProzac and Haldol.No h/o inpatient or suicidal attempt.  Psychiatric Specialty Exam: Physical Exam  Review of Systems  Weight 232 lb (105.2 kg), last menstrual period 09/01/2013.There is no height or weight on file to calculate BMI.  General Appearance: NA  Eye Contact:  NA  Speech:  Clear and Coherent  Volume:   Normal  Mood:  Euthymic  Affect:  NA  Thought Process:  Goal Directed  Orientation:  Full (Time, Place, and Person)  Thought Content:  WDL  Suicidal Thoughts:  No  Homicidal Thoughts:  No  Memory:  Immediate;   Good Recent;   Good Remote;   Good  Judgement:  Intact  Insight:  Present  Psychomotor Activity:  NA  Concentration:  Concentration: Fair and Attention Span: Fair  Recall:  Good  Fund of Knowledge:  Good  Language:  Good  Akathisia:  No  Handed:  Right  AIMS (if indicated):     Assets:  Communication Skills Desire for Improvement Housing Social Support  ADL's:  Intact  Cognition:  WNL  Sleep:   ok      Assessment and Plan: PTSD.  Major depressive disorder, recurrent.  Patient is a stable on her current medication.  Encouraged healthy lifestyle and reinforced to see her PCP for blood work.  Continue Latuda 60 mg daily, Cymbalta 60 mg daily and hydroxyzine 25 mg at bedtime.  Discussed medication side effects and benefits.  Recommended to call us back if she is any question or any concern.  Follow-up in 3 months.  Follow Up Instructions:    I discussed the assessment and treatment plan with the patient. The patient was provided an opportunity to ask questions and all were answered. The patient agreed with the plan and demonstrated an understanding of the instructions.   The patient was advised to call back or seek an in-person evaluation if the symptoms worsen or if the condition fails to improve as anticipated.  I provided 13 minutes of non-face-to-face time during this encounter.   [name], MD

## 2020-08-17 NOTE — Telephone Encounter (Signed)
Call to patient- scheduled for follow up tomorrow- she will get lab drawn at visit. Patient states she is not completely out of her medication- no refill needed today

## 2020-08-18 ENCOUNTER — Ambulatory Visit: Payer: Medicare (Managed Care) | Attending: Internal Medicine | Admitting: Internal Medicine

## 2020-08-18 ENCOUNTER — Other Ambulatory Visit: Payer: Self-pay

## 2020-08-18 ENCOUNTER — Other Ambulatory Visit: Payer: Self-pay | Admitting: Cardiology

## 2020-08-18 ENCOUNTER — Ambulatory Visit (HOSPITAL_BASED_OUTPATIENT_CLINIC_OR_DEPARTMENT_OTHER): Payer: Medicare (Managed Care) | Admitting: Pharmacist

## 2020-08-18 ENCOUNTER — Encounter: Payer: Self-pay | Admitting: Internal Medicine

## 2020-08-18 DIAGNOSIS — E1169 Type 2 diabetes mellitus with other specified complication: Secondary | ICD-10-CM | POA: Diagnosis not present

## 2020-08-18 DIAGNOSIS — Z23 Encounter for immunization: Secondary | ICD-10-CM

## 2020-08-18 DIAGNOSIS — E785 Hyperlipidemia, unspecified: Secondary | ICD-10-CM

## 2020-08-18 DIAGNOSIS — I10 Essential (primary) hypertension: Secondary | ICD-10-CM

## 2020-08-18 DIAGNOSIS — Z8659 Personal history of other mental and behavioral disorders: Secondary | ICD-10-CM

## 2020-08-18 LAB — POCT GLYCOSYLATED HEMOGLOBIN (HGB A1C): HbA1c, POC (controlled diabetic range): 6 % (ref 0.0–7.0)

## 2020-08-18 MED ORDER — HYDROCHLOROTHIAZIDE 25 MG PO TABS: 25.0000 mg | ORAL_TABLET | Freq: Every day | ORAL | 3 refills | Status: DC

## 2020-08-18 NOTE — Patient Instructions (Addendum)
Increase Hydrochlorothiazide to 25 mg daily.   Obesity, Adult Obesity is having too much body fat. Being obese means that your weight is more than what is healthy for you. BMI is a number that explains how much body fat you have. If you have a BMI of 30 or more, you are obese. Obesity is often caused by eating or drinking more calories than your body uses. Changing your lifestyle can help you lose weight. Obesity can cause serious health problems, such as:  Stroke.  Coronary artery disease (CAD).  Type 2 diabetes.  Some types of cancer, including cancers of the colon, breast, uterus, and gallbladder.  Osteoarthritis.  High blood pressure (hypertension).  High cholesterol.  Sleep apnea.  Gallbladder stones.  Infertility problems. What are the causes?  Eating meals each day that are high in calories, sugar, and fat.  Being born with genes that may make you more likely to become obese.  Having a medical condition that causes obesity.  Taking certain medicines.  Sitting a lot (having a sedentary lifestyle).  Not getting enough sleep.  Drinking a lot of drinks that have sugar in them. What increases the risk?  Having a family history of obesity.  Being an [place] American woman.  Being a Hispanic man.  Living in an area with limited access to: ? [place], recreation centers, or sidewalks. ? Healthy food choices, such as grocery stores and farmers' markets. What are the signs or symptoms? The main sign is having too much body fat. How is this treated?  Treatment for this condition often includes changing your lifestyle. Treatment may include: ? Changing your diet. This may include making a healthy meal plan. ? Exercise. This may include activity that causes your heart to beat faster (aerobic exercise) and strength training. Work with your doctor to design a program that works for you. ? Medicine to help you lose weight. This may be used if you are not able to lose 1  pound a week after 6 weeks of healthy eating and more exercise. ? Treating conditions that cause the obesity. ? Surgery. Options may include gastric banding and gastric bypass. This may be done if:  Other treatments have not helped to improve your condition.  You have a BMI of 40 or higher.  You have life-threatening health problems related to obesity. Follow these instructions at home: Eating and drinking   Follow advice from your doctor about what to eat and drink. Your doctor may tell you to: ? Limit fast food, sweets, and processed snack foods. ? Choose low-fat options. For example, choose low-fat milk instead of whole milk. ? Eat 5 or more servings of fruits or vegetables each day. ? Eat at home more often. This gives you more control over what you eat. ? Choose healthy foods when you eat out. ? Learn to read food labels. This will help you learn how much food is in 1 serving. ? Keep low-fat snacks available. ? Avoid drinks that have a lot of sugar in them. These include soda, fruit juice, iced tea with sugar, and flavored milk.  Drink enough water to keep your pee (urine) pale yellow.  Do not go on fad diets. Physical activity  Exercise often, as told by your doctor. Most adults should get up to 150 minutes of moderate-intensity exercise every week.Ask your doctor: ? What types of exercise are safe for you. ? How often you should exercise.  Warm up and stretch before being active.  Do slow stretching  after being active (cool down).  Rest between times of being active. Lifestyle  Work with your doctor and a food expert (dietitian) to set a weight-loss goal that is best for you.  Limit your screen time.  Find ways to reward yourself that do not involve food.  Do not drink alcohol if: ? Your doctor tells you not to drink. ? You are pregnant, may be pregnant, or are planning to become pregnant.  If you drink alcohol: ? Limit how much you use to:  0-1 drink a day  for women.  0-2 drinks a day for men. ? Be aware of how much alcohol is in your drink. In the [place] one drink equals one 12 oz bottle of beer (355 mL), one 5 oz glass of wine (148 mL), or one 1 oz glass of hard liquor (44 mL). General instructions  Keep a weight-loss journal. This can help you keep track of: ? The food that you eat. ? How much exercise you get.  Take over-the-counter and prescription medicines only as told by your doctor.  Take vitamins and supplements only as told by your doctor.  Think about joining a support group.  Keep all follow-up visits as told by your doctor. This is important. Contact a doctor if:  You cannot meet your weight loss goal after you have changed your diet and lifestyle for 6 weeks. Get help right away if you:  Are having trouble breathing.  Are having thoughts of harming yourself. Summary  Obesity is having too much body fat.  Being obese means that your weight is more than what is healthy for you.  Work with your doctor to set a weight-loss goal.  Get regular exercise as told by your doctor. This information is not intended to replace advice given to you by your health care provider. Make sure you discuss any questions you have with your health care provider. Document Revised: 06/04/2018 Document Reviewed: 06/04/2018 Elsevier Patient Education  2020 [employer].

## 2020-08-18 NOTE — Progress Notes (Signed)
Patient ID: Michele Wood, female    DOB: November 01, 1960  MRN: [id number]  CC: Diabetes   Subjective: Michele Wood is a 59 y.o. female who presents for chronic disease management. Her concerns today include:  Hx of HTN,LT atrial myxoma (s/p minimally invasive surgery 05/2019), minimal CAD on cardiac CT,chronic sinusitis, degen disc of c-spine, chronic LBP, vit D def, obesity, anx/dep, DM (with ? neuropathy in feetfrom DM vs lower back), dep with psychosis.  Last seen 10/2019.  Purpose of today's visit is chronic disease management.  DM:  Checks BS once every 2 wks.  Taking Metformin Gained 26 lbs in the last 1 yr.  Not walking her dog as much as she use to.  Admits she has been "cheating" with her eating habits.  She has her grandkids over often and has been indulging in sweet treats that she gives to them.    HTN/minimal CAD:  Compliant with meds and salt restriction.  She is on amlodipine, metoprolol and hydrochlorothiazide.  Denies any chest pains or shortness of breath.  No lower extremity edema.  HL: Taking and tolerating atorvastatin.    Depression: She feels she is doing well on Cymbalta and Lurasidone.  Sees pshychiatrist once a mth Patient Active Problem List   Diagnosis Date Noted   Hyperlipidemia associated with type 2 diabetes mellitus ([place]) 02/28/2020   Hypotension 07/19/2019   CAD (coronary artery disease) 06/23/2019   S/P minimally-invasive resection of left atrial myxoma 06/08/2019   Pancreatic cyst 05/31/2019   Spondylolisthesis of lumbar region 03/31/2019   Atrial myxoma 03/19/2019   Dyslipidemia, goal LDL below 70 12/16/2018   Family history of heart disease 12/16/2018   Left bundle branch block 12/16/2018   History of chest pain 12/16/2018   MDD (major depressive disorder), recurrent episode, moderate ([place]) 10/19/2018   Non-insulin treated type 2 diabetes mellitus ([place]) 12/12/2017   Morbid obesity ([place]) 09/12/2017   Anxiety and  depression 06/13/2017   Cervical radiculopathy due to intervertebral disc disorder 06/13/2017   Chronic low back pain without sciatica 09/18/2015   DJD (degenerative joint disease), lumbar 06/08/2015   Vitamin D insufficiency 04/25/2014   HTN (hypertension) 06/21/2008     Current Outpatient Medications on File Prior to Visit  Medication Sig Dispense Refill   amLODipine (NORVASC) 10 MG tablet      ASPIRIN LOW DOSE 81 MG EC tablet TAKE 1 TABLET BY MOUTH EVERY DAY 120 tablet 1   atorvastatin (LIPITOR) 40 MG tablet TAKE 1 TABLET BY MOUTH EVERY DAY 90 tablet 3   Cholecalciferol (VITAMIN D3) 2000 UNITS TABS Take 2,000 Units by mouth daily. (Patient taking differently: Take 2,000 Units by mouth 2 (two) times a week. ) 30 tablet 11   DULoxetine (CYMBALTA) 60 MG capsule Take 1 capsule (60 mg total) by mouth daily. 30 capsule 2   hydrOXYzine (VISTARIL) 25 MG capsule Take 1 capsule (25 mg total) by mouth at bedtime as needed for anxiety. 30 capsule 2   Lurasidone HCl 60 MG TABS Take 1 tablet (60 mg total) by mouth daily with breakfast. 30 tablet 2   metFORMIN (GLUCOPHAGE) 500 MG tablet TAKE 1 TABLET BY MOUTH EVERY DAY WITH BREAKFAST 90 tablet 0   methocarbamol (ROBAXIN) 750 MG tablet Take 1 tablet (750 mg total) by mouth every 6 (six) hours as needed for muscle spasms. 50 tablet 1   metoprolol tartrate (LOPRESSOR) 50 MG tablet TAKE 1 TABLET BY MOUTH TWICE A DAY 180 tablet 0   montelukast (SINGULAIR)  10 MG tablet Take 1 tablet (10 mg total) by mouth at bedtime. (Patient taking differently: Take 10 mg by mouth daily. ) 30 tablet 3   mupirocin ointment (BACTROBAN) 2 % APPLY NASALLY TWICE A DAY FOR 5 DAYS     traMADol (ULTRAM) 50 MG tablet      triamcinolone (NASACORT AQ) 55 MCG/ACT AERO nasal inhaler Place 2 sprays into the nose daily. 1 Inhaler 12   No current facility-administered medications on file prior to visit.    Allergies  Allergen Reactions   Codeine Sulfate Rash    Milk-Related Compounds Diarrhea and Other (See Comments)    Diarrhea and stomach upset    Social History   Socioeconomic History   Marital status: Married    Spouse name: John   Number of children: 2   Years of education: Not on file   Highest education level: Some college, no degree  Occupational History   Not on file  Tobacco Use   Smoking status: Never Smoker   Smokeless tobacco: Never Used  Vaping Use   Vaping Use: Never used  Substance and Sexual Activity   Alcohol use: Yes    Alcohol/week: 0.0 standard drinks    Comment: rare   Drug use: No   Sexual activity: Not Currently  Other Topics Concern   Not on file  Social History Narrative   Not on file   Social Determinants of Health   Financial Resource Strain:    Difficulty of Paying Living Expenses: Not on file  Food Insecurity:    Worried About [place] in the Last Year: Not on file   Ran Out of Food in the Last Year: Not on file  Transportation Needs:    Lack of Transportation (Medical): Not on file   Lack of Transportation (Non-Medical): Not on file  Physical Activity:    Days of Exercise per Week: Not on file   Minutes of Exercise per Session: Not on file  Stress:    Feeling of Stress : Not on file  Social Connections:    Frequency of Communication with Friends and Family: Not on file   Frequency of Social Gatherings with Friends and Family: Not on file   Attends Religious Services: Not on file   Active Member of Clubs or Organizations: Not on file   Attends Archivist Meetings: Not on file   Marital Status: Not on file  Intimate Partner Violence:    Fear of Current or Ex-Partner: Not on file   Emotionally Abused: Not on file   Physically Abused: Not on file   Sexually Abused: Not on file    Family History  Problem Relation Age of Onset   Hypertension Mother    Hypertension Other    Schizophrenia Brother     Past Surgical History:   Procedure Laterality Date   ABDOMINAL HYSTERECTOMY     BACK SURGERY     BREAST BIOPSY Right 07/02/2006   [place] Core benign   MINIMALLY INVASIVE EXCISION OF ATRIAL MYXOMA N/A 06/08/2019   Procedure: MINIMALLY INVASIVE RESECTION OF LEFT ATRIAL MYXOMA;  Surgeon: [name], MD;  Location: [facility];  Service: Open Heart Surgery;  Laterality: N/A;   NASAL SINUS SURGERY     TEE WITHOUT CARDIOVERSION N/A 05/17/2019   Procedure: TRANSESOPHAGEAL ECHOCARDIOGRAM (TEE);  Surgeon: Acie Fredrickson Wonda Cheng, MD;  Location: [facility];  Service: Cardiovascular;  Laterality: N/A;   TEE WITHOUT CARDIOVERSION N/A 06/08/2019   Procedure: TRANSESOPHAGEAL ECHOCARDIOGRAM (TEE);  Surgeon: [name], MD;  Location: [facility];  Service: Open Heart Surgery;  Laterality: N/A;    ROS: Review of Systems Negative except as stated above  PHYSICAL EXAM: BP (!) 145/94    Pulse 69    Temp 98.5 F (36.9 C) (Oral)    Ht 5\' 2"  (1.575 m)    Wt 258 lb 9.6 oz (117.3 kg)    LMP 09/01/2013    SpO2 98%    BMI 47.30 kg/m   Wt Readings from Last 3 Encounters:  08/18/20 258 lb 9.6 oz (117.3 kg)  11/01/19 232 lb 12.8 oz (105.6 kg)  07/19/19 232 lb (105.2 kg)   Repeat blood pressure 142/100 Physical Exam  General appearance - alert, well appearing, obese middle-age female and in no distress Mental status - normal mood, behavior, speech, dress, motor activity, and thought processes Eyes - pupils equal and reactive, extraocular eye movements intact Neck - supple, no significant adenopathy Chest - clear to auscultation, no wheezes, rales or rhonchi, symmetric air entry Heart - normal rate, regular rhythm, normal S1, S2, no murmurs, rubs, clicks or gallops Extremities - peripheral pulses normal, no pedal edema, no clubbing or cyanosis Diabetic Foot Exam - Simple   Simple Foot Form Visual Inspection No deformities, no ulcerations, no other skin breakdown bilaterally: Yes Sensation Testing Intact to touch and monofilament  testing bilaterally: Yes Pulse Check Posterior Tibialis and Dorsalis pulse intact bilaterally: Yes Comments      Depression screen [facility] 2/9 11/01/2019 10/19/2018 06/19/2018  Decreased Interest 1 1 1   Down, Depressed, Hopeless 1 1 1   PHQ - 2 Score 2 2 2   Altered sleeping 2 1 1   Tired, decreased energy 1 2 1   Change in appetite 0 0 2  Feeling bad or failure about yourself  1 2 2   Trouble concentrating 2 3 2   Moving slowly or fidgety/restless 1 1 1   Suicidal thoughts 1 1 1   PHQ-9 Score 10 12 12   Some recent data might be hidden    CMP Latest Ref Rng & Units 11/05/2019 06/27/2019 06/23/2019  Glucose 65 - 99 mg/dL 92 112(H) 104(H)  BUN 6 - 24 mg/dL 14 16 19   Creatinine 0.57 - 1.00 mg/dL 0.86 1.16(H) 1.38(H)  Sodium 134 - 144 mmol/L 145(H) 140 140  Potassium 3.5 - 5.2 mmol/L 3.7 3.1(L) 4.0  Chloride 96 - 106 mmol/L 103 105 100  CO2 20 - 29 mmol/L 26 22 24   Calcium 8.7 - 10.2 mg/dL 9.9 8.9 9.8  Total Protein 6.5 - 8.1 g/dL - 7.2 -  Total Bilirubin 0.3 - 1.2 mg/dL - 0.4 -  Alkaline Phos 38 - 126 U/L - 84 -  AST 15 - 41 U/L - 12(L) -  ALT 0 - 44 U/L - 13 -   Lipid Panel     Component Value Date/Time   CHOL 154 05/13/2019 1358   TRIG 191 (H) 05/13/2019 1358   HDL 34 (L) 05/13/2019 1358   CHOLHDL 4.5 (H) 05/13/2019 1358   CHOLHDL 6.1 10/26/2013 0949   VLDL 36 10/26/2013 0949   LDLCALC 82 05/13/2019 1358    CBC    Component Value Date/Time   WBC 10.8 (H) 06/27/2019 0040   RBC 4.15 06/27/2019 0040   HGB 9.9 (L) 06/27/2019 0040   HGB 10.0 (L) 06/23/2019 1007   HCT 33.0 (L) 06/27/2019 0040   HCT 31.0 (L) 06/23/2019 1007   PLT 461 (H) 06/27/2019 0040   PLT 496 (H) 06/23/2019 1007  MCV 79.5 (L) 06/27/2019 0040   MCV 78 (L) 06/23/2019 1007   MCH 23.9 (L) 06/27/2019 0040   MCHC 30.0 06/27/2019 0040   RDW 14.5 06/27/2019 0040   RDW 14.4 06/23/2019 1007   LYMPHSABS 2.6 06/23/2019 1007   MONOABS 0.5 12/03/2013 1508   EOSABS 0.2 06/23/2019 1007   BASOSABS 0.1 06/23/2019 1007    Results for orders placed or performed in visit on 08/18/20  POCT glycosylated hemoglobin (Hb A1C)  Result Value Ref Range   Hemoglobin A1C     HbA1c POC (<> result, manual entry)     HbA1c, POC (prediabetic range)     HbA1c, POC (controlled diabetic range) 6.0 0.0 - 7.0 %     ASSESSMENT AND PLAN: 1. Type 2 diabetes mellitus with morbid obesity (HCC) At goal.  Continue Metformin. Spent some time doing nutritional counseling.  She declines referral to nutritionist. Encouraged her to get in some form of exercise as tolerated at least 3 to 4 days a week for 30 minutes. - CBC - Comprehensive metabolic panel - Lipid panel - POCT glycosylated hemoglobin (Hb A1C)  2. Essential hypertension Not at goal.  Increase HCTZ to 25 mg daily. - hydrochlorothiazide (HYDRODIURIL) 25 MG tablet; Take 1 tablet (25 mg total) by mouth daily.  Dispense: 90 tablet; Refill: 3  3. Hyperlipidemia associated with type 2 diabetes mellitus (HCC) Continue atorvastatin  4. Need for influenza vaccination Given  5. History of depression She is plugged into mental health services and reports she is doing well on her medications.     Patient was given the opportunity to ask questions.  Patient verbalized understanding of the plan and was able to repeat key elements of the plan.   Orders Placed This Encounter  Procedures   CBC   Comprehensive metabolic panel   Lipid panel   POCT glycosylated hemoglobin (Hb A1C)     Requested Prescriptions   Signed Prescriptions Disp Refills   hydrochlorothiazide (HYDRODIURIL) 25 MG tablet 90 tablet 3    Sig: Take 1 tablet (25 mg total) by mouth daily.    Return in about 4 months (around 12/16/2020).  Karle Plumber, MD, FACP

## 2020-08-18 NOTE — Progress Notes (Signed)
Patient presents for vaccination against influenza per orders of Dr. Johnson. Consent given. Counseling provided. No contraindications exists. Vaccine administered without incident.  ° °Luke Van Ausdall, PharmD, CPP °Clinical Pharmacist °[facility] °[phone] ° °

## 2020-08-19 LAB — CBC
Hematocrit: 40.2 % (ref 34.0–46.6)
Hemoglobin: 13.3 g/dL (ref 11.1–15.9)
MCH: 26.8 pg (ref 26.6–33.0)
MCHC: 33.1 g/dL (ref 31.5–35.7)
MCV: 81 fL (ref 79–97)
Platelets: 336 10*3/uL (ref 150–450)
RBC: 4.96 x10E6/uL (ref 3.77–5.28)
RDW: 13.1 % (ref 11.7–15.4)
WBC: 6 10*3/uL (ref 3.4–10.8)

## 2020-08-19 LAB — COMPREHENSIVE METABOLIC PANEL
ALT: 18 IU/L (ref 0–32)
AST: 18 IU/L (ref 0–40)
Albumin/Globulin Ratio: 1.1 — ABNORMAL LOW (ref 1.2–2.2)
Albumin: 4 g/dL (ref 3.8–4.9)
Alkaline Phosphatase: 108 IU/L (ref 44–121)
BUN/Creatinine Ratio: 10 (ref 9–23)
BUN: 9 mg/dL (ref 6–24)
Bilirubin Total: 0.4 mg/dL (ref 0.0–1.2)
CO2: 27 mmol/L (ref 20–29)
Calcium: 9.3 mg/dL (ref 8.7–10.2)
Chloride: 100 mmol/L (ref 96–106)
Creatinine, Ser: 0.86 mg/dL (ref 0.57–1.00)
GFR calc Af Amer: 86 mL/min/{1.73_m2} (ref >59)
GFR calc non Af Amer: 75 mL/min/{1.73_m2} (ref >59)
Globulin, Total: 3.5 g/dL (ref 1.5–4.5)
Glucose: 76 mg/dL (ref 65–99)
Potassium: 4.5 mmol/L (ref 3.5–5.2)
Sodium: 138 mmol/L (ref 134–144)
Total Protein: 7.5 g/dL (ref 6.0–8.5)

## 2020-08-19 LAB — LIPID PANEL
Chol/HDL Ratio: 4.4 ratio (ref 0.0–4.4)
Cholesterol, Total: 172 mg/dL (ref 100–199)
HDL: 39 mg/dL — ABNORMAL LOW (ref >39)
LDL Chol Calc (NIH): 104 mg/dL — ABNORMAL HIGH (ref 0–99)
Triglycerides: 165 mg/dL — ABNORMAL HIGH (ref 0–149)
VLDL Cholesterol Cal: 29 mg/dL (ref 5–40)

## 2020-08-19 NOTE — Progress Notes (Signed)
Let patient know that her blood count is normal meaning her red blood cells, white blood cell and platelet counts are normal.  Kidney and liver function tests are normal.  Her LDL cholesterol is 104 with goal being less than 70.  Please confirm whether she has been taking the atorvastatin consistently every night for the past 2 to 3 months.  If she has not been doing so please encourage her to do so.  If she has been taking it consistently then we will need to increase the dose from 40 mg daily to 60 mg daily.  Please let me know

## 2020-08-22 NOTE — Progress Notes (Signed)
Pt given lab results per notes of Dr. Wynetta Emery on 08/19/20. Pt verbalized understanding. Please send [facility]atin to [facility] on file.

## 2020-08-23 ENCOUNTER — Ambulatory Visit (INDEPENDENT_AMBULATORY_CARE_PROVIDER_SITE_OTHER): Payer: Medicare (Managed Care) | Admitting: Psychology

## 2020-08-23 DIAGNOSIS — F3289 Other specified depressive episodes: Secondary | ICD-10-CM | POA: Diagnosis not present

## 2020-09-20 ENCOUNTER — Ambulatory Visit (INDEPENDENT_AMBULATORY_CARE_PROVIDER_SITE_OTHER): Payer: Medicare (Managed Care) | Admitting: Psychology

## 2020-09-20 DIAGNOSIS — F3289 Other specified depressive episodes: Secondary | ICD-10-CM

## 2020-10-16 ENCOUNTER — Other Ambulatory Visit: Payer: Self-pay | Admitting: Internal Medicine

## 2020-10-16 DIAGNOSIS — I10 Essential (primary) hypertension: Secondary | ICD-10-CM

## 2020-10-16 NOTE — Telephone Encounter (Signed)
Requested Prescriptions  Pending Prescriptions Disp Refills  . metoprolol tartrate (LOPRESSOR) 50 MG tablet [Pharmacy Med Name: METOPROLOL TARTRATE 50 MG TAB] 180 tablet 0    Sig: TAKE 1 TABLET BY MOUTH TWICE A DAY     Cardiovascular:  Beta Blockers Failed - 10/16/2020  1:45 AM      Failed - Last BP in normal range    BP Readings from Last 1 Encounters:  08/18/20 (!) 145/94         Passed - Last Heart Rate in normal range    Pulse Readings from Last 1 Encounters:  08/18/20 69         Passed - Valid encounter within last 6 months    Recent Outpatient Visits          1 month ago Need for influenza vaccination   [facility] [place], Jeannett Senior L, RPH-CPP   1 month ago Type 2 diabetes mellitus with morbid obesity (HCC)   [facility] Jonah Blue B, MD   7 months ago Type 2 diabetes mellitus with morbid obesity [facility])   [place] [facility] Jonah Blue B, MD   11 months ago Controlled type 2 diabetes mellitus without complication, without long-term current use of insulin [name])   [place] [facility] Jonah Blue B, MD   1 year ago Controlled type 2 diabetes mellitus without complication, without long-term current use of insulin [facility])   [employer] [facility] Marcine Matar, MD      Future Appointments            In 2 months Laural Benes, Binnie Rail, MD [facility]

## 2020-10-18 ENCOUNTER — Ambulatory Visit (INDEPENDENT_AMBULATORY_CARE_PROVIDER_SITE_OTHER): Payer: Medicare (Managed Care) | Admitting: Psychology

## 2020-10-18 DIAGNOSIS — F3289 Other specified depressive episodes: Secondary | ICD-10-CM

## 2020-10-30 IMAGING — CT CT ANGIOGRAPHY CHEST
4 of 8 series · 17 of 36 positions shown · IV contrast (iopamidol)
Comparison: CT scan of February 11, 2019.

CLINICAL DATA: Left atrial myxoma. Chest pain. Nontraumatic aortic
disease.

EXAM:
CT ANGIOGRAPHY CHEST, ABDOMEN AND PELVIS
TECHNIQUE: Multidetector CT imaging through the chest, abdomen and pelvis was
performed using the standard protocol during bolus administration of
intravenous contrast. Multiplanar reconstructed images and MIPs were
obtained and reviewed to evaluate the vascular anatomy.
CONTRAST:  75mL SDWL4N-SGR IOPAMIDOL (SDWL4N-SGR) INJECTION 76%

[Series 5: cta cap aneurysm 2.00 bv36 s3 axial arterial · axial · arterial · 0.90mm/px · z∈[+1268,+1730]mm · 7 of 309 slices shown (1 of 2)]
[im 39/309  lung]
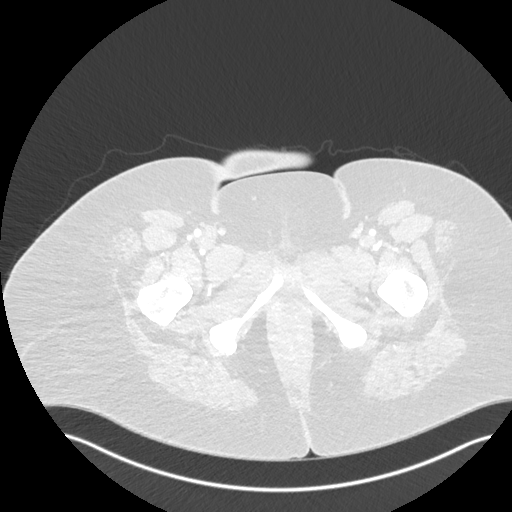
[im 78/309  mediastinal]
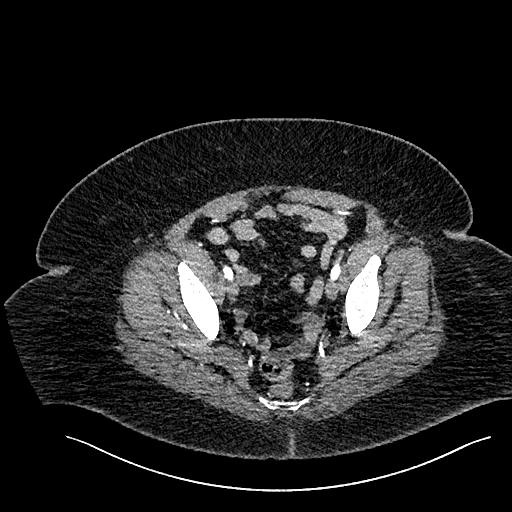
[im 116/309  lung]
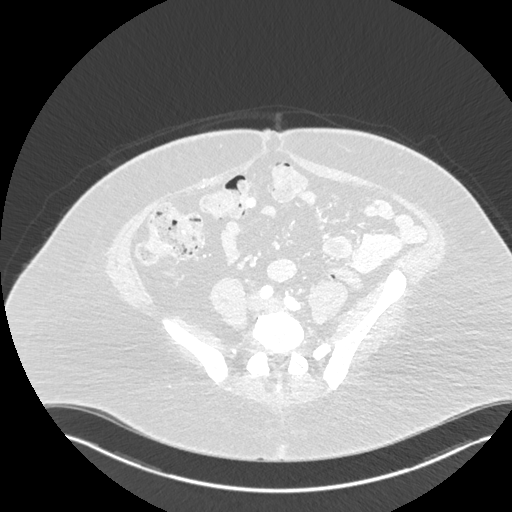
[im 155/309  mediastinal]
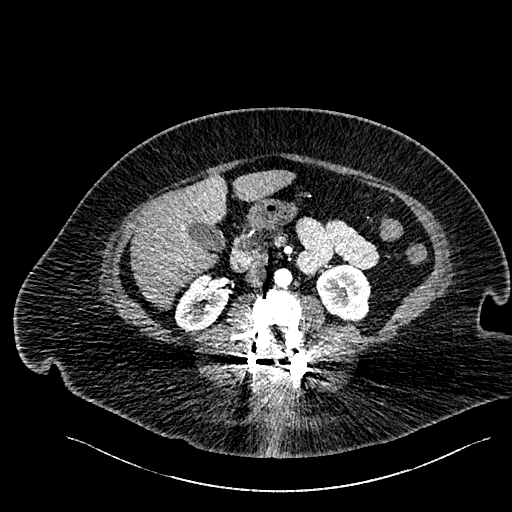
[im 193/309  lung]
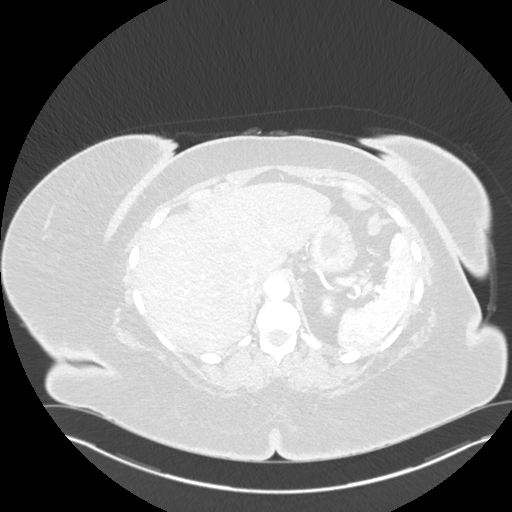
[im 232/309  mediastinal]
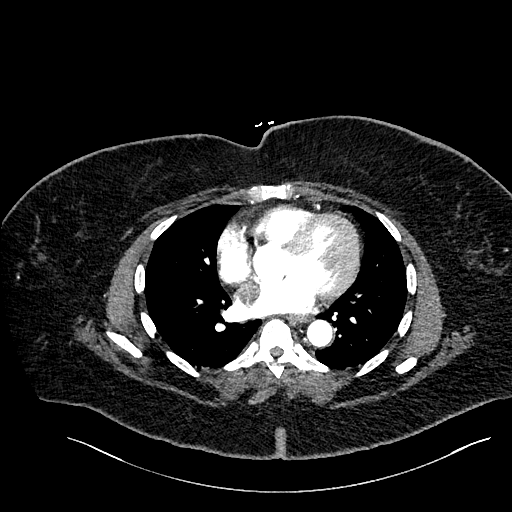
[im 270/309  lung]
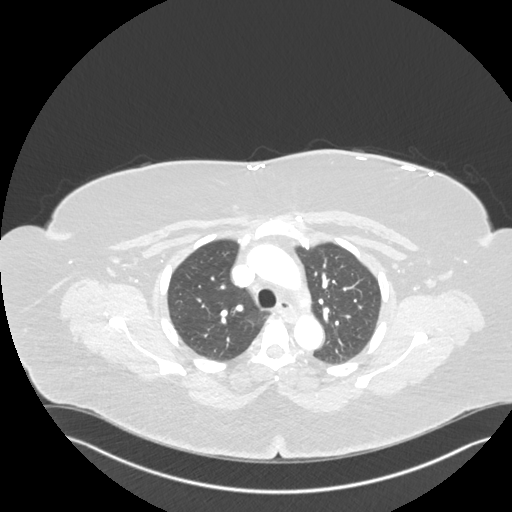

[Series 6: cta cap aneurysm 2.00 bv36 s3 axial arterial · axial · arterial · 0.90mm/px · z∈[+1268,+1730]mm · 7 of 309 slices shown (2 of 2)]
[im 39/309  lung]
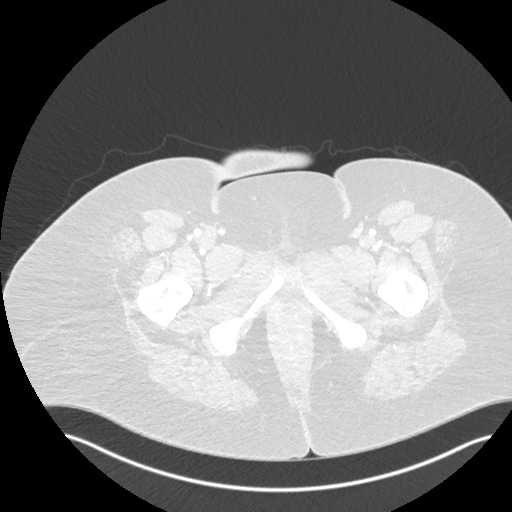
[im 78/309  lung]
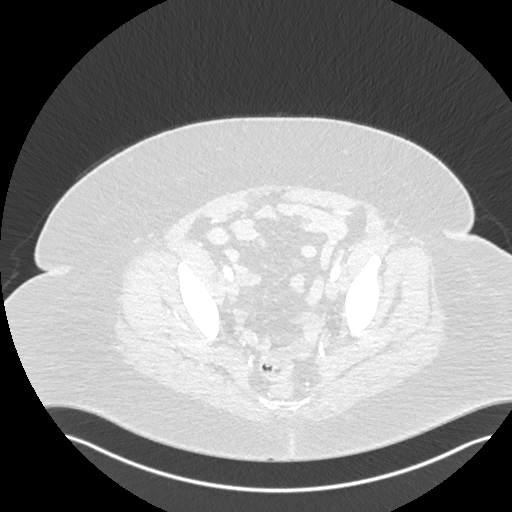
[im 116/309  lung]
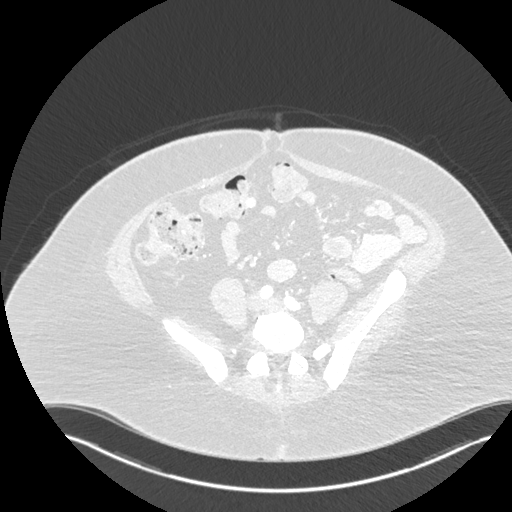
[im 155/309  lung]
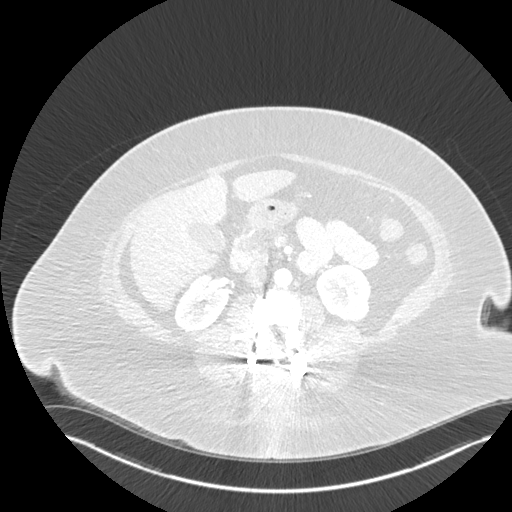
[im 193/309  lung]
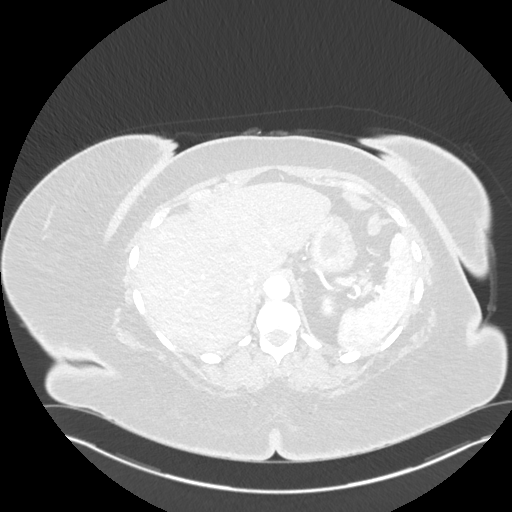
[im 232/309  lung]
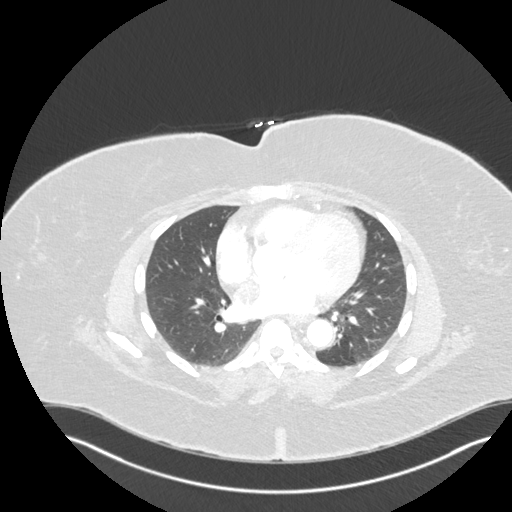
[im 270/309  lung]
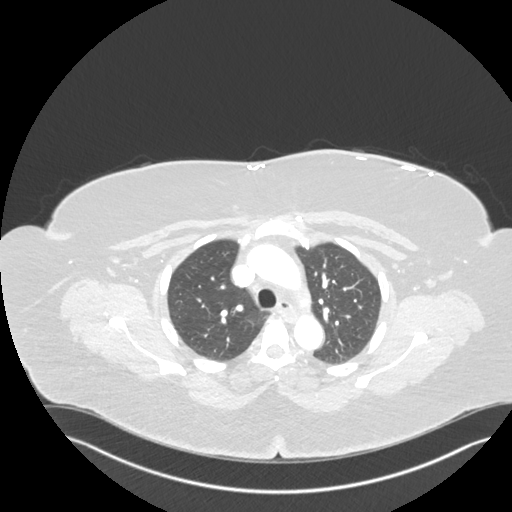

[Series 7: cta cap aneurysm 2.00 bv36 s3 cor st · coronal · 0.90mm/px · 1 of 182 slices shown]
[im 91/182  mediastinal]
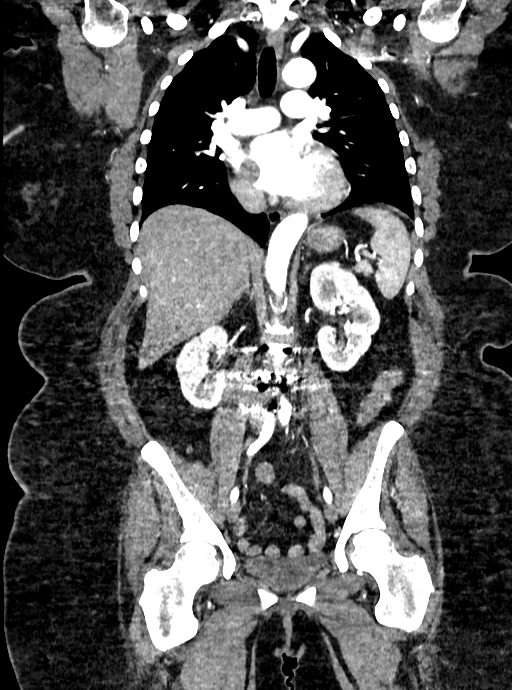

[Series 15: cta cap aneurysm 2.00 br60 s3 lungs · axial · 0.71mm/px · z∈[+1268,+1346]mm · 2 of 309 slices shown]
[im 39/309  lung]
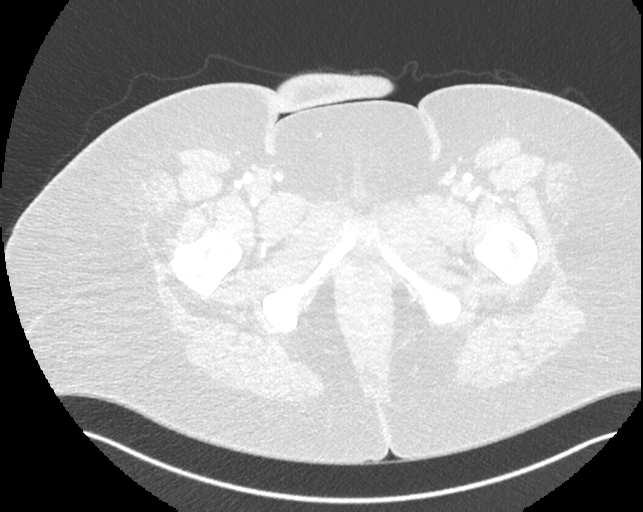
[im 78/309  lung]
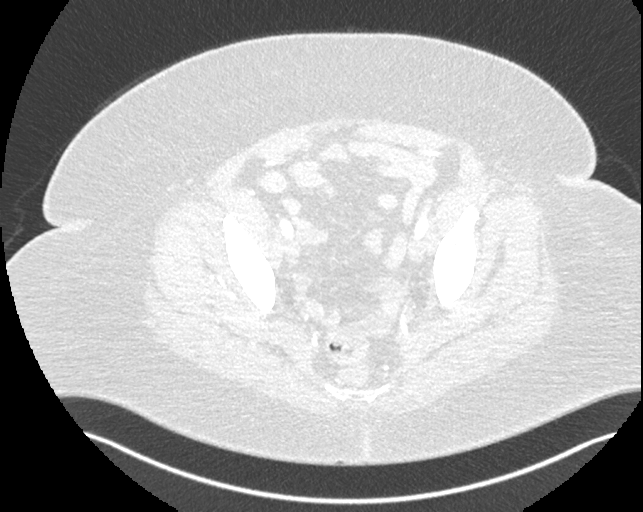

[17 of 36 positions shown; findings below may reference images not displayed]

FINDINGS: CTA CHEST FINDINGS

Cardiovascular: There is no evidence of thoracic aortic dissection
or aneurysm. Great vessels are widely patent without significant
stenosis. 2.5 cm left atrial mass is noted most consistent with
myxoma. Normal cardiac size. No pericardial effusion is noted.

Mediastinum/Nodes: No enlarged mediastinal, hilar, or axillary lymph
nodes. Thyroid gland, trachea, and esophagus demonstrate no
significant findings.

Lungs/Pleura: Lungs are clear. No pleural effusion or pneumothorax.

Musculoskeletal: No chest wall abnormality. No acute or significant
osseous findings.

Review of the MIP images confirms the above findings.

CTA ABDOMEN AND PELVIS FINDINGS

VASCULAR

Aorta: Normal caliber aorta without aneurysm, dissection, vasculitis
or significant stenosis.

Celiac: Patent without evidence of aneurysm, dissection, vasculitis
or significant stenosis.

SMA: Patent without evidence of aneurysm, dissection, vasculitis or
significant stenosis.

Renals: Both renal arteries are patent without evidence of aneurysm,
dissection, vasculitis, fibromuscular dysplasia or significant
stenosis.

IMA: Patent without evidence of aneurysm, dissection, vasculitis or
significant stenosis.

Inflow: Patent without evidence of aneurysm, dissection, vasculitis
or significant stenosis.

Veins: No obvious venous abnormality within the limitations of this
arterial phase study.

Review of the MIP images confirms the above findings.

NON-VASCULAR

Hepatobiliary: No focal liver abnormality is seen. No gallstones,
gallbladder wall thickening, or biliary dilatation.

Pancreas: Possible 1.4 cm cyst seen in pancreatic body.

Spleen: Normal in size without focal abnormality.

Adrenals/Urinary Tract: Adrenal glands are unremarkable. Kidneys are
normal, without renal calculi, focal lesion, or hydronephrosis.
Bladder is unremarkable.

Stomach/Bowel: Stomach is within normal limits. Appendix appears
normal. No evidence of bowel wall thickening, distention, or
inflammatory changes.

Lymphatic: No significant vascular findings are present. No enlarged
abdominal or pelvic lymph nodes.

Reproductive: Status post hysterectomy. No adnexal masses.

Other: Small fat containing periumbilical hernia is noted. No
ascites is noted.

Musculoskeletal: No acute or significant osseous findings.

Review of the MIP images confirms the above findings.
IMPRESSION: No evidence of thoracic or abdominal aortic dissection or aneurysm.

Great vessels and mesenteric and renal arteries are widely patent
without significant stenosis.

2.5 cm left atrial pedunculated mass is noted most consistent with
myxoma.

Possible 1.4 cm cyst seen in pancreatic body. Recommend follow up
pre and post contrast MRI/MRCP or pancreatic protocol CT in 1 year.
This recommendation follows ACR consensus guidelines: Management of
Incidental Pancreatic Cysts: A White Paper of the ACR Incidental
Findings Committee. [HOSPITAL] 4759;[DATE].

## 2020-11-14 DIAGNOSIS — H524 Presbyopia: Secondary | ICD-10-CM | POA: Diagnosis not present

## 2020-11-14 DIAGNOSIS — H5212 Myopia, left eye: Secondary | ICD-10-CM | POA: Diagnosis not present

## 2020-11-14 DIAGNOSIS — E119 Type 2 diabetes mellitus without complications: Secondary | ICD-10-CM | POA: Diagnosis not present

## 2020-11-14 DIAGNOSIS — H52223 Regular astigmatism, bilateral: Secondary | ICD-10-CM | POA: Diagnosis not present

## 2020-11-14 DIAGNOSIS — H02834 Dermatochalasis of left upper eyelid: Secondary | ICD-10-CM | POA: Diagnosis not present

## 2020-11-14 DIAGNOSIS — H2513 Age-related nuclear cataract, bilateral: Secondary | ICD-10-CM | POA: Diagnosis not present

## 2020-11-14 DIAGNOSIS — H35033 Hypertensive retinopathy, bilateral: Secondary | ICD-10-CM | POA: Diagnosis not present

## 2020-11-14 DIAGNOSIS — H02831 Dermatochalasis of right upper eyelid: Secondary | ICD-10-CM | POA: Diagnosis not present

## 2020-11-14 DIAGNOSIS — H10413 Chronic giant papillary conjunctivitis, bilateral: Secondary | ICD-10-CM | POA: Diagnosis not present

## 2020-11-14 LAB — HM DIABETES EYE EXAM

## 2020-11-15 ENCOUNTER — Telehealth (INDEPENDENT_AMBULATORY_CARE_PROVIDER_SITE_OTHER): Payer: Medicare Other | Admitting: Psychiatry

## 2020-11-15 ENCOUNTER — Encounter (HOSPITAL_COMMUNITY): Payer: Self-pay | Admitting: Psychiatry

## 2020-11-15 ENCOUNTER — Other Ambulatory Visit: Payer: Self-pay

## 2020-11-15 ENCOUNTER — Ambulatory Visit (INDEPENDENT_AMBULATORY_CARE_PROVIDER_SITE_OTHER): Payer: Medicare Other | Admitting: Psychology

## 2020-11-15 DIAGNOSIS — F4310 Post-traumatic stress disorder, unspecified: Secondary | ICD-10-CM

## 2020-11-15 DIAGNOSIS — F331 Major depressive disorder, recurrent, moderate: Secondary | ICD-10-CM

## 2020-11-15 DIAGNOSIS — F3289 Other specified depressive episodes: Secondary | ICD-10-CM | POA: Diagnosis not present

## 2020-11-15 MED ORDER — HYDROXYZINE PAMOATE 25 MG PO CAPS: 25.0000 mg | ORAL_CAPSULE | Freq: Every evening | ORAL | 2 refills | Status: DC | PRN

## 2020-11-15 MED ORDER — DULOXETINE HCL 60 MG PO CPEP: 60.0000 mg | ORAL_CAPSULE | Freq: Every day | ORAL | 2 refills | Status: DC

## 2020-11-15 MED ORDER — LURASIDONE HCL 60 MG PO TABS: 60.0000 mg | ORAL_TABLET | Freq: Every day | ORAL | 2 refills | Status: DC

## 2020-11-15 NOTE — Progress Notes (Signed)
Virtual Visit via Telephone Note  I connected with Michele Wood on 11/15/20 at 11:20 AM EST by telephone and verified that I am speaking with the correct person using two identifiers.  Location: Patient: Home Provider: Home office   I discussed the limitations, risks, security and privacy concerns of performing an evaluation and management service by telephone and the availability of in person appointments. I also discussed with the patient that there may be a patient responsible charge related to this service. The patient expressed understanding and agreed to proceed.   History of Present Illness: Patient is evaluated by phone session.  She is taking her medication and she feels things are going well.  Her Christmas and holidays were quite but she had a good time with her husband.  She denies any nightmares or flashbacks.  She feels the current medicine is helping her depression and she sleeps good.  She takes hydroxyzine which helps her tremors and sleep.  He does not want to change the medication.  Patient lives with her husband and 2 son.  She admitted 3 pounds weight gain because she is eating out but hoping to cut down and wants to eat healthy.  She denies any suicidal thoughts, feeling of hopelessness or worthlessness.    Past Psychiatric History:Reviewed. H/Oparanoia, hallucination, depression since childhood. Witnessed Therapist, music. H/Onightmare and flashback. TriedProzac and Haldol.No h/o inpatient or suicidal attempt.  Recent Results (from the past 2160 hour(s))  POCT glycosylated hemoglobin (Hb A1C)     Status: Normal   Collection Time: 08/18/20 11:52 AM  Result Value Ref Range   Hemoglobin A1C     HbA1c POC (<> result, manual entry)     HbA1c, POC (prediabetic range)     HbA1c, POC (controlled diabetic range) 6.0 0.0 - 7.0 %  CBC     Status: None   Collection Time: 08/18/20 11:54 AM  Result Value Ref Range   WBC 6.0 3.4 - 10.8  x10E3/uL   RBC 4.96 3.77 - 5.28 x10E6/uL   Hemoglobin 13.3 11.1 - 15.9 g/dL   Hematocrit 40.2 34.0 - 46.6 %   MCV 81 79 - 97 fL   MCH 26.8 26.6 - 33.0 pg   MCHC 33.1 31.5 - 35.7 g/dL   RDW 13.1 11.7 - 15.4 %   Platelets 336 150 - 450 x10E3/uL  Comprehensive metabolic panel     Status: Abnormal   Collection Time: 08/18/20 11:54 AM  Result Value Ref Range   Glucose 76 65 - 99 mg/dL   BUN 9 6 - 24 mg/dL   Creatinine, Ser 0.86 0.57 - 1.00 mg/dL   GFR calc non Af Amer 75 >59 mL/min/1.73   GFR calc Af Amer 86 >59 mL/min/1.73    Comment: **In accordance with recommendations from the NKF-ASN Task force,**   [facility] is in the process of updating its eGFR calculation to the   2021 CKD-EPI creatinine equation that estimates kidney function   without a race variable.    BUN/Creatinine Ratio 10 9 - 23   Sodium 138 134 - 144 mmol/L   Potassium 4.5 3.5 - 5.2 mmol/L   Chloride 100 96 - 106 mmol/L   CO2 27 20 - 29 mmol/L   Calcium 9.3 8.7 - 10.2 mg/dL   Total Protein 7.5 6.0 - 8.5 g/dL   Albumin 4.0 3.8 - 4.9 g/dL   Globulin, Total 3.5 1.5 - 4.5 g/dL   Albumin/Globulin Ratio 1.1 (L) 1.2 - 2.2   Bilirubin  Total 0.4 0.0 - 1.2 mg/dL   Alkaline Phosphatase 108 44 - 121 IU/L    Comment:               **Please note reference interval change**   AST 18 0 - 40 IU/L   ALT 18 0 - 32 IU/L  Lipid panel     Status: Abnormal   Collection Time: 08/18/20 11:54 AM  Result Value Ref Range   Cholesterol, Total 172 100 - 199 mg/dL   Triglycerides 165 (H) 0 - 149 mg/dL   HDL 39 (L) >39 mg/dL   VLDL Cholesterol Cal 29 5 - 40 mg/dL   LDL Chol Calc (NIH) 104 (H) 0 - 99 mg/dL   Chol/HDL Ratio 4.4 0.0 - 4.4 ratio    Comment:                                   T. Chol/HDL Ratio                                             Men  Women                               1/2 Avg.Risk  3.4    3.3                                   Avg.Risk  5.0    4.4                                2X Avg.Risk  9.6    7.1                                 3X Avg.Risk 23.4   11.0      Psychiatric Specialty Exam: Physical Exam  Review of Systems  Weight 258 lb (117 kg), last menstrual period 09/01/2013.There is no height or weight on file to calculate BMI.  General Appearance: NA  Eye Contact:  NA  Speech:  Clear and Coherent  Volume:  Normal  Mood:  Euthymic  Affect:  NA  Thought Process:  Goal Directed  Orientation:  Full (Time, Place, and Person)  Thought Content:  Logical  Suicidal Thoughts:  No  Homicidal Thoughts:  No  Memory:  Immediate;   Good Recent;   Good Remote;   Good  Judgement:  Good  Insight:  Good  Psychomotor Activity:  NA  Concentration:  Concentration: Fair and Attention Span: Fair  Recall:  Good  Fund of Knowledge:  Good  Language:  Good  Akathisia:  No  Handed:  Right  AIMS (if indicated):     Assets:  Communication Skills Desire for Improvement Housing Resilience Social Support  ADL's:  Intact  Cognition:  WNL  Sleep:   ok      Assessment and Plan: PTSD.  Major depressive disorder, recurrent.  Patient is a stable on her current medication.  I reviewed blood work results.  Her hemoglobin A1c is 6.  Discussed healthy lifestyle and watch her calorie intake.  Continue Latuda 60 mg daily, Cymbalta 60 mg daily and hydroxyzine 25 mg at bedtime.  Recommended to call us back if she is any question or any concern.  Follow-up in 3 months.  Follow Up Instructions:    I discussed the assessment and treatment plan with the patient. The patient was provided an opportunity to ask questions and all were answered. The patient agreed with the plan and demonstrated an understanding of the instructions.   The patient was advised to call back or seek an in-person evaluation if the symptoms worsen or if the condition fails to improve as anticipated.  I provided 18 minutes of non-face-to-face time during this encounter.   [name], MD

## 2020-11-22 ENCOUNTER — Encounter: Payer: Self-pay | Admitting: Podiatry

## 2020-11-22 ENCOUNTER — Other Ambulatory Visit: Payer: Self-pay

## 2020-11-22 ENCOUNTER — Ambulatory Visit (INDEPENDENT_AMBULATORY_CARE_PROVIDER_SITE_OTHER): Payer: Medicare (Managed Care) | Admitting: Podiatry

## 2020-11-22 DIAGNOSIS — M79675 Pain in left toe(s): Secondary | ICD-10-CM

## 2020-11-22 DIAGNOSIS — E119 Type 2 diabetes mellitus without complications: Secondary | ICD-10-CM | POA: Diagnosis not present

## 2020-11-22 DIAGNOSIS — B351 Tinea unguium: Secondary | ICD-10-CM

## 2020-11-22 DIAGNOSIS — M79674 Pain in right toe(s): Secondary | ICD-10-CM

## 2020-11-22 NOTE — Progress Notes (Signed)
Subjective: Michele Wood presents today for preventative diabetic foot care and painful thick toenails that are difficult to trim. Pain interferes with ambulation. Aggravating factors include wearing enclosed shoe gear. Pain is relieved with periodic professional debridement.  She did not check her blood glucose this morning.   Michele Pier, MD is patient's PCP. Last visit was 08/18/2020.  Past Medical History:  Diagnosis Date  . Chronic back pain   . DDD (degenerative disc disease), cervical   . Depression   . Diabetes mellitus without complication ([place])   . Hypertension    at age 75  . Obesity   . Pancreatic cyst 05/31/2019   Incidental - discovered on CT scan  . s/p minimally-invasive resection of left atrial myxoma 06/08/2019  . Sinusitis   . Sleep apnea    at age 66    Patient Active Problem List   Diagnosis Date Noted  . Hyperlipidemia associated with type 2 diabetes mellitus (Michele Wood) 02/28/2020  . Hypotension 07/19/2019  . CAD (coronary artery disease) 06/23/2019  . S/P minimally-invasive resection of left atrial myxoma 06/08/2019  . Pancreatic cyst 05/31/2019  . Spondylolisthesis of lumbar region 03/31/2019  . Atrial myxoma 03/19/2019  . Dyslipidemia, goal LDL below 70 12/16/2018  . Family history of heart disease 12/16/2018  . Left bundle branch block 12/16/2018  . History of chest pain 12/16/2018  . MDD (major depressive disorder), recurrent episode, moderate (Michele Wood) 10/19/2018  . Non-insulin treated type 2 diabetes mellitus (Michele Wood) 12/12/2017  . Morbid obesity (Michele Wood) 09/12/2017  . Anxiety and depression 06/13/2017  . Cervical radiculopathy due to intervertebral disc disorder 06/13/2017  . Chronic low back pain without sciatica 09/18/2015  . DJD (degenerative joint disease), lumbar 06/08/2015  . Vitamin D insufficiency 04/25/2014  . HTN (hypertension) 06/21/2008    Past Surgical History:  Procedure Laterality Date  . ABDOMINAL HYSTERECTOMY    . BACK  SURGERY    . BREAST BIOPSY Right 07/02/2006   [place] Core benign  . MINIMALLY INVASIVE EXCISION OF ATRIAL MYXOMA N/A 06/08/2019   Procedure: MINIMALLY INVASIVE RESECTION OF LEFT ATRIAL MYXOMA;  Surgeon: Michele Alberts, MD;  Location: [facility];  Service: Open Heart Surgery;  Laterality: N/A;  . NASAL SINUS SURGERY    . TEE WITHOUT CARDIOVERSION N/A 05/17/2019   Procedure: TRANSESOPHAGEAL ECHOCARDIOGRAM (TEE);  Surgeon: Michele Fredrickson Wonda Cheng, MD;  Location: [facility];  Service: Cardiovascular;  Laterality: N/A;  . TEE WITHOUT CARDIOVERSION N/A 06/08/2019   Procedure: TRANSESOPHAGEAL ECHOCARDIOGRAM (TEE);  Surgeon: Michele Alberts, MD;  Location: [facility];  Service: Open Heart Surgery;  Laterality: N/A;    Current Outpatient Medications on File Prior to Visit  Medication Sig Dispense Refill  . amLODipine (NORVASC) 10 MG tablet     . ASPIRIN LOW DOSE 81 MG EC tablet TAKE 1 TABLET BY MOUTH EVERY DAY 120 tablet 1  . atorvastatin (LIPITOR) 40 MG tablet TAKE 1 TABLET BY MOUTH EVERY DAY 90 tablet 3  . Cholecalciferol (VITAMIN D3) 2000 UNITS TABS Take 2,000 Units by mouth daily. (Patient taking differently: Take 2,000 Units by mouth 2 (two) times a week. ) 30 tablet 11  . DULoxetine (CYMBALTA) 60 MG capsule Take 1 capsule (60 mg total) by mouth daily. 30 capsule 2  . hydrochlorothiazide (HYDRODIURIL) 25 MG tablet Take 1 tablet (25 mg total) by mouth daily. 90 tablet 3  . hydrOXYzine (VISTARIL) 25 MG capsule Take 1 capsule (25 mg total) by mouth at bedtime as needed for anxiety. 30 capsule 2  .  Lurasidone HCl 60 MG TABS Take 1 tablet (60 mg total) by mouth daily with breakfast. 30 tablet 2  . metFORMIN (GLUCOPHAGE) 500 MG tablet TAKE 1 TABLET BY MOUTH EVERY DAY WITH BREAKFAST 90 tablet 0  . methocarbamol (ROBAXIN) 750 MG tablet Take 1 tablet (750 mg total) by mouth every 6 (six) hours as needed for muscle spasms. 50 tablet 1  . metoprolol tartrate (LOPRESSOR) 50 MG tablet TAKE 1 TABLET BY MOUTH TWICE A DAY 180  tablet 0  . montelukast (SINGULAIR) 10 MG tablet Take 1 tablet (10 mg total) by mouth at bedtime. (Patient taking differently: Take 10 mg by mouth daily. ) 30 tablet 3  . mupirocin ointment (BACTROBAN) 2 % APPLY NASALLY TWICE A DAY FOR 5 DAYS    . traMADol (ULTRAM) 50 MG tablet     . triamcinolone (NASACORT AQ) 55 MCG/ACT AERO nasal inhaler Place 2 sprays into the nose daily. 1 Inhaler 12   No current facility-administered medications on file prior to visit.     Allergies  Allergen Reactions  . Codeine Sulfate Rash  . Milk-Related Compounds Diarrhea and Other (See Comments)    Diarrhea and stomach upset    Social History   Occupational History  . Not on file  Tobacco Use  . Smoking status: Never Smoker  . Smokeless tobacco: Never Used  Vaping Use  . Vaping Use: Never used  Substance and Sexual Activity  . Alcohol use: Yes    Alcohol/week: 0.0 standard drinks    Comment: rare  . Drug use: No  . Sexual activity: Not Currently    Family History  Problem Relation Age of Onset  . Hypertension Mother   . Hypertension Other   . Schizophrenia Brother     Immunization History  Administered Date(s) Administered  . Influenza Inj Mdck Quad With Preservative 08/27/2019  . Influenza Whole 09/13/2008, 08/29/2019  . Influenza,inj,Quad PF,6+ Mos 09/22/2014, 09/18/2015, 08/22/2016, 06/13/2017, 06/19/2018, 08/18/2020  . PFIZER(Purple Top)SARS-COV-2 Vaccination 01/25/2020, 02/22/2020  . Pneumococcal Polysaccharide-23 10/19/2018  . Tdap 09/03/2016     Objective: There were no vitals filed for this visit.  Michele Wood is a pleasant 60 y.o. African American female, morbidly obese in NAD. AAO X 3.  Vascular Examination: Capillary refill time to digits immediate b/l. Palpable pedal pulses b/l LE. Pedal hair sparse. Lower extremity skin temperature gradient within normal limits.  Dermatological Examination: Pedal skin with normal turgor, texture and tone bilaterally. No open  wounds bilaterally. No interdigital macerations bilaterally. Toenails 1-5 b/l elongated, discolored, dystrophic, thickened, crumbly with subungual debris and tenderness to dorsal palpation. Hyperkeratotic lesion(s) R hallux.  No erythema, no edema, no drainage, no fluctuance.  Musculoskeletal Examination: Normal muscle strength 5/5 to all lower extremity muscle groups bilaterally. No pain crepitus or joint limitation noted with ROM b/l. No gross bony deformities bilaterally. Utilizes cane for ambulation assistance.  Neurological Examination: Protective sensation intact 5/5 intact bilaterally with 10g monofilament b/l. Vibratory sensation intact b/l. Proprioception intact bilaterally.  Hemoglobin A1C Latest Ref Rng & Units 08/18/2020  HGBA1C 0.0 - 7.0 % 6.0  Some recent data might be hidden   Assessment: 1. Pain due to onychomycosis of toenails of both feet   2. Controlled type 2 diabetes mellitus without complication, without long-term current use of insulin ([place])     Plan: -Examined patient. -Medicaid ABN signed for this year. Patient refuses services of callus paring of right hallux today. Copy given to patient on today's visit and copy placed in patient  chart. Patient advised if she changes her mind at any time, she can come in and sign new ABN form to have callus trimmed. She related understanding. -Continue diabetic foot care principles. -Patient to continue soft, supportive shoe gear daily. -Toenails 1-5 b/l were debrided in length and girth with sterile nail nippers and dremel without iatrogenic bleeding.  -Patient/POA to call should there be question/concern in the interim.  Return in about 3 months (around 02/19/2021).  Marzetta Board, DPM

## 2020-12-13 ENCOUNTER — Ambulatory Visit (INDEPENDENT_AMBULATORY_CARE_PROVIDER_SITE_OTHER): Payer: Medicare Other | Admitting: Psychology

## 2020-12-13 DIAGNOSIS — F320 Major depressive disorder, single episode, mild: Secondary | ICD-10-CM

## 2020-12-18 ENCOUNTER — Other Ambulatory Visit: Payer: Self-pay

## 2020-12-18 ENCOUNTER — Ambulatory Visit: Payer: Medicare Other | Attending: Internal Medicine | Admitting: Internal Medicine

## 2020-12-18 DIAGNOSIS — I10 Essential (primary) hypertension: Secondary | ICD-10-CM

## 2020-12-18 DIAGNOSIS — E1169 Type 2 diabetes mellitus with other specified complication: Secondary | ICD-10-CM | POA: Diagnosis not present

## 2020-12-18 DIAGNOSIS — E785 Hyperlipidemia, unspecified: Secondary | ICD-10-CM | POA: Diagnosis not present

## 2020-12-18 MED ORDER — METFORMIN HCL 500 MG PO TABS: ORAL_TABLET | ORAL | 2 refills | Status: DC

## 2020-12-18 MED ORDER — ATORVASTATIN CALCIUM 40 MG PO TABS: 40.0000 mg | ORAL_TABLET | Freq: Every day | ORAL | 3 refills | Status: DC

## 2020-12-18 MED ORDER — METOPROLOL TARTRATE 50 MG PO TABS
50.0000 mg | ORAL_TABLET | Freq: Two times a day (BID) | ORAL | 1 refills | Status: DC
Start: 2020-12-18 — End: 2021-12-05

## 2020-12-18 MED ORDER — AMLODIPINE BESYLATE 10 MG PO TABS: 10.0000 mg | ORAL_TABLET | Freq: Every day | ORAL | 5 refills | Status: DC

## 2020-12-18 MED ORDER — CONTOUR NEXT TEST VI STRP: ORAL_STRIP | 12 refills | Status: DC

## 2020-12-18 NOTE — Progress Notes (Signed)
Virtual Visit via Telephone Note  I connected with Michele Wood on 12/18/20 at 10:55 a.m by telephone and verified that I am speaking with the correct person using two identifiers.  Location: Patient: home Provider: office  pt and myself participated in this encounter. I discussed the limitations, risks, security and privacy concerns of performing an evaluation and management service by telephone and the availability of in person appointments. I also discussed with the patient that there may be a patient responsible charge related to this service. The patient expressed understanding and agreed to proceed.   History of Present Illness: Hx of HTN,LT atrial myxoma (s/p minimally invasive surgery 05/2019), minimal CAD on cardiac CT,chronic sinusitis, degen disc of c-spine, chronic LBP, vit D def, obesity, anx/dep, DM (with ? neuropathy in feetfrom DM vs lower back), dep with psychosis. Last seen 08/2020.  HYPERTENSION/CAD Currently taking: see medication list -HCTZ 25 mg, Metoprolol and Norvasc.  HCTZ inc on last visit to 25 mg daily. Med Adherence: [x]  Yes    []  No Medication side effects: []  Yes    [x]  No Adherence with salt restriction: [x]  Yes    []  No Home Monitoring?: []  Yes    [x]  No Monitoring Frequency: []  Yes    []  No Home BP results range: []  Yes    []  No SOB? []  Yes    [x]  No Chest Pain?: []  Yes    [x]  No Leg swelling?: [x]  Yes -feet Headaches?: []  Yes    [x]  No Dizziness? []  Yes    [x]  No Comments:   DIABETES TYPE 2 Last A1C:   Lab Results  Component Value Date   HGBA1C 6.0 08/18/2020   Med Adherence:  Reports she has been out of Metformin x 2 mths. Medication side effects:  []  Yes    [x]  No Home Monitoring?  []  Yes    [x]  No.  Request refills on glucose strips for Contour meter. Home glucose results range:  Diet Adherence: [x]  Yes  - doing better.  Not eating as much Exercise: [x]  Yes -she walk her dogs daily for 45 mins Hypoglycemic episodes?: []  Yes    []   No Numbness of the feet? [x]  Yes    []  No Retinopathy hx? []  Yes    [x]  No Last eye exam: She is up-to-date. Comments:    HL: Last LDL cholesterol was 104 on last visit.  She reports compliance with atorvastatin.  However based on the last prescription, she should have been out of refills for a while.  Outpatient Encounter Medications as of 12/18/2020  Medication Sig  . amLODipine (NORVASC) 10 MG tablet   . ASPIRIN LOW DOSE 81 MG EC tablet TAKE 1 TABLET BY MOUTH EVERY DAY  . atorvastatin (LIPITOR) 40 MG tablet TAKE 1 TABLET BY MOUTH EVERY DAY  . Cholecalciferol (VITAMIN D3) 2000 UNITS TABS Take 2,000 Units by mouth daily. (Patient taking differently: Take 2,000 Units by mouth 2 (two) times a week. )  . DULoxetine (CYMBALTA) 60 MG capsule Take 1 capsule (60 mg total) by mouth daily.  . hydrochlorothiazide (HYDRODIURIL) 25 MG tablet Take 1 tablet (25 mg total) by mouth daily.  . hydrOXYzine (VISTARIL) 25 MG capsule Take 1 capsule (25 mg total) by mouth at bedtime as needed for anxiety.  . Lurasidone HCl 60 MG TABS Take 1 tablet (60 mg total) by mouth daily with breakfast.  . metFORMIN (GLUCOPHAGE) 500 MG tablet TAKE 1 TABLET BY MOUTH EVERY DAY WITH BREAKFAST  . methocarbamol (ROBAXIN)  750 MG tablet Take 1 tablet (750 mg total) by mouth every 6 (six) hours as needed for muscle spasms.  . metoprolol tartrate (LOPRESSOR) 50 MG tablet TAKE 1 TABLET BY MOUTH TWICE A DAY  . montelukast (SINGULAIR) 10 MG tablet Take 1 tablet (10 mg total) by mouth at bedtime. (Patient taking differently: Take 10 mg by mouth daily. )  . mupirocin ointment (BACTROBAN) 2 % APPLY NASALLY TWICE A DAY FOR 5 DAYS  . traMADol (ULTRAM) 50 MG tablet   . triamcinolone (NASACORT AQ) 55 MCG/ACT AERO nasal inhaler Place 2 sprays into the nose daily.   No facility-administered encounter medications on file as of 12/18/2020.      Observations/Objective: Results for orders placed or performed in visit on 08/18/20  CBC  Result  Value Ref Range   WBC 6.0 3.4 - 10.8 x10E3/uL   RBC 4.96 3.77 - 5.28 x10E6/uL   Hemoglobin 13.3 11.1 - 15.9 g/dL   Hematocrit 40.2 34.0 - 46.6 %   MCV 81 79 - 97 fL   MCH 26.8 26.6 - 33.0 pg   MCHC 33.1 31.5 - 35.7 g/dL   RDW 13.1 11.7 - 15.4 %   Platelets 336 150 - 450 x10E3/uL  Comprehensive metabolic panel  Result Value Ref Range   Glucose 76 65 - 99 mg/dL   BUN 9 6 - 24 mg/dL   Creatinine, Ser 0.86 0.57 - 1.00 mg/dL   GFR calc non Af Amer 75 >59 mL/min/1.73   GFR calc Af Amer 86 >59 mL/min/1.73   BUN/Creatinine Ratio 10 9 - 23   Sodium 138 134 - 144 mmol/L   Potassium 4.5 3.5 - 5.2 mmol/L   Chloride 100 96 - 106 mmol/L   CO2 27 20 - 29 mmol/L   Calcium 9.3 8.7 - 10.2 mg/dL   Total Protein 7.5 6.0 - 8.5 g/dL   Albumin 4.0 3.8 - 4.9 g/dL   Globulin, Total 3.5 1.5 - 4.5 g/dL   Albumin/Globulin Ratio 1.1 (L) 1.2 - 2.2   Bilirubin Total 0.4 0.0 - 1.2 mg/dL   Alkaline Phosphatase 108 44 - 121 IU/L   AST 18 0 - 40 IU/L   ALT 18 0 - 32 IU/L  Lipid panel  Result Value Ref Range   Cholesterol, Total 172 100 - 199 mg/dL   Triglycerides 165 (H) 0 - 149 mg/dL   HDL 39 (L) >39 mg/dL   VLDL Cholesterol Cal 29 5 - 40 mg/dL   LDL Chol Calc (NIH) 104 (H) 0 - 99 mg/dL   Chol/HDL Ratio 4.4 0.0 - 4.4 ratio  POCT glycosylated hemoglobin (Hb A1C)  Result Value Ref Range   Hemoglobin A1C     HbA1c POC (<> result, manual entry)     HbA1c, POC (prediabetic range)     HbA1c, POC (controlled diabetic range) 6.0 0.0 - 7.0 %     Assessment and Plan: 1. Type 2 diabetes mellitus with morbid obesity ([place]) Refill sent on Metformin and for glucose test strips.  Advised to check the blood sugar at least once a day before breakfast with goal being 90-130.  Encouraged her to continue to try to eat healthy and continue regular exercise. - glucose blood (CONTOUR NEXT TEST) test strip; Use as instructed  Dispense: 100 each; Refill: 12 - metFORMIN (GLUCOPHAGE) 500 MG tablet; TAKE 1 TABLET BY MOUTH  EVERY DAY WITH BREAKFAST  Dispense: 90 tablet; Refill: 2 - Hemoglobin A1c; Future - Microalbumin / creatinine urine ratio; Future  2.  Essential hypertension Continue current medications and low-salt diet.  Follow-up with clinical pharmacist in 1 week for blood pressure check.  She will stop at the lab when she comes in. - metoprolol tartrate (LOPRESSOR) 50 MG tablet; Take 1 tablet (50 mg total) by mouth 2 (two) times daily.  Dispense: 180 tablet; Refill: 1 - amLODipine (NORVASC) 10 MG tablet; Take 1 tablet (10 mg total) by mouth daily.  Dispense: 30 tablet; Refill: 5  3. Hyperlipidemia associated with type 2 diabetes mellitus ([place]) Refill sent on atorvastatin.  I doubt patient had been taking the medication as consistently as she said. - Lipid panel; Future - atorvastatin (LIPITOR) 40 MG tablet; Take 1 tablet (40 mg total) by mouth daily.  Dispense: 90 tablet; Refill: 3   Follow Up Instructions: 6 wks for AWV Follow-up with clinical pharmacist in 1 week for blood pressure check.   I discussed the assessment and treatment plan with the patient. The patient was provided an opportunity to ask questions and all were answered. The patient agreed with the plan and demonstrated an understanding of the instructions.   The patient was advised to call back or seek an in-person evaluation if the symptoms worsen or if the condition fails to improve as anticipated.  I provided 12 minutes of non-face-to-face time during this encounter.   Karle Plumber, MD

## 2021-01-01 ENCOUNTER — Ambulatory Visit: Payer: Medicare Other | Attending: Internal Medicine | Admitting: Pharmacist

## 2021-01-01 ENCOUNTER — Encounter: Payer: Self-pay | Admitting: Pharmacist

## 2021-01-01 ENCOUNTER — Other Ambulatory Visit: Payer: Self-pay

## 2021-01-01 VITALS — BP 122/86

## 2021-01-01 DIAGNOSIS — E785 Hyperlipidemia, unspecified: Secondary | ICD-10-CM | POA: Diagnosis not present

## 2021-01-01 DIAGNOSIS — E1169 Type 2 diabetes mellitus with other specified complication: Secondary | ICD-10-CM | POA: Diagnosis not present

## 2021-01-01 DIAGNOSIS — I10 Essential (primary) hypertension: Secondary | ICD-10-CM | POA: Diagnosis not present

## 2021-01-01 NOTE — Progress Notes (Signed)
   S:    PCP: Dr. Wynetta Emery  Patient arrives in good spirits. Presents to the clinic for hypertension evaluation, counseling, and management. Patient was referred and last seen by Primary Care Provider on 12/18/2020.   Medication adherence reported. She has taken all medications today.   Current BP Medications include:  Amlodipine 10 mg daily, HCTZ 25 mg daily metoprolol tartrate 50 mg BID  Dietary habits include: compliant with salt restriction; admits to heavy caffeine consumption (sodas) Exercise habits include: walks ~30 minutes daily  Family / Social history:  - FHx: HTN - Tobacco: never smoker -Alcohol: denies use    O:  Vitals:   01/01/21 1447  BP: 122/86   Home BP readings: none   Last 3 Office BP readings: BP Readings from Last 3 Encounters:  01/01/21 122/86  08/18/20 (!) 145/94  01/05/20 (!) 129/97   BMET    Component Value Date/Time   NA 138 08/18/2020 1154   K 4.5 08/18/2020 1154   CL 100 08/18/2020 1154   CO2 27 08/18/2020 1154   GLUCOSE 76 08/18/2020 1154   GLUCOSE 112 (H) 06/27/2019 0040   BUN 9 08/18/2020 1154   CREATININE 0.86 08/18/2020 1154   CREATININE 0.85 04/25/2015 1602   CALCIUM 9.3 08/18/2020 1154   GFRNONAA 75 08/18/2020 1154   GFRNONAA 79 04/25/2015 1602   GFRAA 86 08/18/2020 1154   GFRAA >89 04/25/2015 1602    Renal function: CrCl cannot be calculated (Patient's most recent lab result is older than the maximum 21 days allowed.).  Clinical ASCVD: CAD - no event  The 10-year ASCVD risk score Mikey Bussing DC Jr., et al., 2013) is: 13.3%   Values used to calculate the score:     Age: 60 years     Sex: Female     Is Non-Hispanic African American: Yes     Diabetic: Yes     Tobacco smoker: No     Systolic Blood Pressure: 564 mmHg     Is BP treated: Yes     HDL Cholesterol: 39 mg/dL     Total Cholesterol: 172 mg/dL  A/P: Hypertension longstanding currently close to goal on current medications. BP Goal = < 130/80 mmHg. Medication adherence  reported.  -Continued current regimen.  -F/u labs ordered - lipid, micorlabumin/creatinine ratio, A1c per PCP -Counseled on lifestyle modifications for blood pressure control including reduced dietary sodium, increased exercise, adequate sleep.  Results reviewed and written information provided.   Total time in face-to-face counseling 15 minutes.   F/U Clinic Visit in April with Dr. Wynetta Emery.   Benard Halsted, PharmD, [facility] [place], [place] [phone]

## 2021-01-02 LAB — LIPID PANEL
Chol/HDL Ratio: 4.8 ratio — ABNORMAL HIGH (ref 0.0–4.4)
Cholesterol, Total: 157 mg/dL (ref 100–199)
HDL: 33 mg/dL — ABNORMAL LOW (ref >39)
LDL Chol Calc (NIH): 77 mg/dL (ref 0–99)
Triglycerides: 291 mg/dL — ABNORMAL HIGH (ref 0–149)
VLDL Cholesterol Cal: 47 mg/dL — ABNORMAL HIGH (ref 5–40)

## 2021-01-02 LAB — MICROALBUMIN / CREATININE URINE RATIO
Creatinine, Urine: 402.4 mg/dL
Microalb/Creat Ratio: 8 mg/g creat (ref 0–29)
Microalbumin, Urine: 33.7 ug/mL

## 2021-01-10 ENCOUNTER — Ambulatory Visit (INDEPENDENT_AMBULATORY_CARE_PROVIDER_SITE_OTHER): Payer: Medicare Other | Admitting: Psychology

## 2021-01-10 DIAGNOSIS — F3289 Other specified depressive episodes: Secondary | ICD-10-CM | POA: Diagnosis not present

## 2021-01-18 ENCOUNTER — Emergency Department (HOSPITAL_COMMUNITY)
Admission: EM | Admit: 2021-01-18 | Discharge: 2021-01-18 | Disposition: A | Discharge status: Home / Self Care | Payer: Medicare Other | Attending: Emergency Medicine | Admitting: Emergency Medicine

## 2021-01-18 ENCOUNTER — Emergency Department (HOSPITAL_COMMUNITY): Payer: Medicare Other

## 2021-01-18 DIAGNOSIS — M5432 Sciatica, left side: Secondary | ICD-10-CM | POA: Diagnosis not present

## 2021-01-18 DIAGNOSIS — I10 Essential (primary) hypertension: Secondary | ICD-10-CM | POA: Insufficient documentation

## 2021-01-18 DIAGNOSIS — M7989 Other specified soft tissue disorders: Secondary | ICD-10-CM | POA: Diagnosis not present

## 2021-01-18 DIAGNOSIS — E785 Hyperlipidemia, unspecified: Secondary | ICD-10-CM | POA: Diagnosis not present

## 2021-01-18 DIAGNOSIS — Z79899 Other long term (current) drug therapy: Secondary | ICD-10-CM | POA: Diagnosis not present

## 2021-01-18 DIAGNOSIS — M79652 Pain in left thigh: Secondary | ICD-10-CM | POA: Insufficient documentation

## 2021-01-18 DIAGNOSIS — Z7982 Long term (current) use of aspirin: Secondary | ICD-10-CM | POA: Insufficient documentation

## 2021-01-18 DIAGNOSIS — E1169 Type 2 diabetes mellitus with other specified complication: Secondary | ICD-10-CM | POA: Diagnosis not present

## 2021-01-18 DIAGNOSIS — Z7984 Long term (current) use of oral hypoglycemic drugs: Secondary | ICD-10-CM | POA: Diagnosis not present

## 2021-01-18 DIAGNOSIS — I2510 Atherosclerotic heart disease of native coronary artery without angina pectoris: Secondary | ICD-10-CM | POA: Diagnosis not present

## 2021-01-18 DIAGNOSIS — M5442 Lumbago with sciatica, left side: Secondary | ICD-10-CM | POA: Diagnosis not present

## 2021-01-18 DIAGNOSIS — M545 Low back pain, unspecified: Secondary | ICD-10-CM | POA: Diagnosis not present

## 2021-01-18 MED ORDER — PREDNISONE 10 MG PO TABS: 40.0000 mg | ORAL_TABLET | Freq: Every day | ORAL | 0 refills | Status: DC

## 2021-01-18 MED ORDER — TRAMADOL HCL 50 MG PO TABS: 50.0000 mg | ORAL_TABLET | Freq: Four times a day (QID) | ORAL | 0 refills | Status: DC | PRN

## 2021-01-18 NOTE — ED Triage Notes (Signed)
Pt presents to the ED with shooting pain down her left leg that starts on her rip for a few days. Denies trauma. Unknown hx of sciatica pain. Sensation equal bilaterally.

## 2021-01-18 NOTE — Discharge Instructions (Signed)
CT scan does show evidence of a herniated disc at the lower part of your back.  Technically at L5-S1.  This certainly could be causing your symptoms.  Make an appointment to follow-up with Dr. Arnoldo Morale from neurosurgery.  Take the prednisone as directed.  Follow your blood sugars carefully.  Also would make an appointment to follow-up with your primary care doctor at the [facility] to help follow your blood sugars.  Recommend rest off your feet is much as possible.  Return for any new or worse symptoms.

## 2021-01-18 NOTE — ED Notes (Signed)
Patient transported to CT 

## 2021-01-18 NOTE — ED Provider Notes (Signed)
[facility] EMERGENCY DEPARTMENT Provider Note   CSN: [id number] Arrival date & time: 01/18/21  7412     History Chief Complaint  Patient presents with  . Leg Pain    Michele Wood is a 60 y.o. female.  Patient with 4-day history of left lower back pain that goes into the buttocks and into the thigh area.  Down to the knee.  But does not go into the foot.  No numbness or weakness.  Patient in June 2020 had fusion done by Dr. Arnoldo Morale from neurosurgery in the lower part of her back.  Patient denies any right leg symptoms.  Patient denies any incontinence issues.        Past Medical History:  Diagnosis Date  . Chronic back pain   . DDD (degenerative disc disease), cervical   . Depression   . Diabetes mellitus without complication ([place])   . Hypertension    at age 34  . Obesity   . Pancreatic cyst 05/31/2019   Incidental - discovered on CT scan  . s/p minimally-invasive resection of left atrial myxoma 06/08/2019  . Sinusitis   . Sleep apnea    at age 35    Patient Active Problem List   Diagnosis Date Noted  . Hyperlipidemia associated with type 2 diabetes mellitus ([place]) 02/28/2020  . Hypotension 07/19/2019  . CAD (coronary artery disease) 06/23/2019  . S/P minimally-invasive resection of left atrial myxoma 06/08/2019  . Pancreatic cyst 05/31/2019  . Spondylolisthesis of lumbar region 03/31/2019  . Atrial myxoma 03/19/2019  . Dyslipidemia, goal LDL below 70 12/16/2018  . Family history of heart disease 12/16/2018  . Left bundle branch block 12/16/2018  . History of chest pain 12/16/2018  . MDD (major depressive disorder), recurrent episode, moderate ([place]) 10/19/2018  . Non-insulin treated type 2 diabetes mellitus ([place]) 12/12/2017  . Morbid obesity ([place]) 09/12/2017  . Anxiety and depression 06/13/2017  . Cervical radiculopathy due to intervertebral disc disorder 06/13/2017  . Chronic low back pain without sciatica 09/18/2015  . DJD (degenerative  joint disease), lumbar 06/08/2015  . Vitamin D insufficiency 04/25/2014  . HTN (hypertension) 06/21/2008    Past Surgical History:  Procedure Laterality Date  . ABDOMINAL HYSTERECTOMY    . BACK SURGERY    . BREAST BIOPSY Right 07/02/2006   [place] Core benign  . MINIMALLY INVASIVE EXCISION OF ATRIAL MYXOMA N/A 06/08/2019   Procedure: MINIMALLY INVASIVE RESECTION OF LEFT ATRIAL MYXOMA;  Surgeon: [name], MD;  Location: [facility];  Service: Open Heart Surgery;  Laterality: N/A;  . NASAL SINUS SURGERY    . TEE WITHOUT CARDIOVERSION N/A 05/17/2019   Procedure: TRANSESOPHAGEAL ECHOCARDIOGRAM (TEE);  Surgeon: Acie Fredrickson Wonda Cheng, MD;  Location: [facility];  Service: Cardiovascular;  Laterality: N/A;  . TEE WITHOUT CARDIOVERSION N/A 06/08/2019   Procedure: TRANSESOPHAGEAL ECHOCARDIOGRAM (TEE);  Surgeon: [name], MD;  Location: [facility];  Service: Open Heart Surgery;  Laterality: N/A;     OB History   No obstetric history on file.     Family History  Problem Relation Age of Onset  . Hypertension Mother   . Hypertension Other   . Schizophrenia Brother     Social History   Tobacco Use  . Smoking status: Never Smoker  . Smokeless tobacco: Never Used  Vaping Use  . Vaping Use: Never used  Substance Use Topics  . Alcohol use: Yes    Alcohol/week: 0.0 standard drinks    Comment: rare  . Drug  use: No    Home Medications Prior to Admission medications   Medication Sig Start Date End Date Taking? Authorizing Provider  predniSONE (DELTASONE) 10 MG tablet Take 4 tablets (40 mg total) by mouth daily. 01/18/21  Yes Fredia Sorrow, MD  traMADol (ULTRAM) 50 MG tablet Take 1 tablet (50 mg total) by mouth every 6 (six) hours as needed for moderate pain. 01/18/21  Yes Fredia Sorrow, MD  amLODipine (NORVASC) 10 MG tablet Take 1 tablet (10 mg total) by mouth daily. 12/18/20   Ladell Pier, MD  ASPIRIN LOW DOSE 81 MG EC tablet TAKE 1 TABLET BY MOUTH EVERY DAY 01/10/20   Ladell Pier, MD  atorvastatin (LIPITOR) 40 MG tablet Take 1 tablet (40 mg total) by mouth daily. 12/18/20   Ladell Pier, MD  Cholecalciferol (VITAMIN D3) 2000 UNITS TABS Take 2,000 Units by mouth daily. Patient taking differently: Take 2,000 Units by mouth 2 (two) times a week.  09/19/15   Funches, Adriana Mccallum, MD  DULoxetine (CYMBALTA) 60 MG capsule Take 1 capsule (60 mg total) by mouth daily. 11/15/20   [name], MD  glucose blood (CONTOUR NEXT TEST) test strip Use as instructed 12/18/20   Ladell Pier, MD  hydrochlorothiazide (HYDRODIURIL) 25 MG tablet Take 1 tablet (25 mg total) by mouth daily. 08/18/20   Ladell Pier, MD  hydrOXYzine (VISTARIL) 25 MG capsule Take 1 capsule (25 mg total) by mouth at bedtime as needed for anxiety. 11/15/20   Arfeen, Arlyce Harman, MD  Lurasidone HCl 60 MG TABS Take 1 tablet (60 mg total) by mouth daily with breakfast. 11/15/20   Arfeen, Arlyce Harman, MD  metFORMIN (GLUCOPHAGE) 500 MG tablet TAKE 1 TABLET BY MOUTH EVERY DAY WITH BREAKFAST 12/18/20   Ladell Pier, MD  methocarbamol (ROBAXIN) 750 MG tablet Take 1 tablet (750 mg total) by mouth every 6 (six) hours as needed for muscle spasms. 04/02/19   Newman Pies, MD  metoprolol tartrate (LOPRESSOR) 50 MG tablet Take 1 tablet (50 mg total) by mouth 2 (two) times daily. 12/18/20   Ladell Pier, MD  montelukast (SINGULAIR) 10 MG tablet Take 1 tablet (10 mg total) by mouth at bedtime. Patient taking differently: Take 10 mg by mouth daily.  08/22/16   Funches, Adriana Mccallum, MD  mupirocin ointment (BACTROBAN) 2 % APPLY NASALLY TWICE A DAY FOR 5 DAYS 06/04/19   [provider]  triamcinolone (NASACORT AQ) 55 MCG/ACT AERO nasal inhaler Place 2 sprays into the nose daily. 08/22/16   Boykin Nearing, MD    Allergies    Codeine sulfate and Milk-related compounds  Review of Systems   Review of Systems  Constitutional: Negative for chills and fever.  HENT: Negative for rhinorrhea and sore throat.   Eyes: Negative for  visual disturbance.  Respiratory: Negative for cough and shortness of breath.   Cardiovascular: Negative for chest pain and leg swelling.  Gastrointestinal: Negative for abdominal pain, diarrhea, nausea and vomiting.  Genitourinary: Negative for dysuria.  Musculoskeletal: Positive for back pain. Negative for neck pain.  Skin: Negative for rash.  Neurological: Negative for dizziness, weakness, light-headedness, numbness and headaches.  Hematological: Does not bruise/bleed easily.  Psychiatric/Behavioral: Negative for confusion.    Physical Exam Updated Vital Signs BP (!) 152/109 (BP Location: Right Arm)   Pulse 87   Temp 99.1 F (37.3 C) (Oral)   Resp 17   LMP 09/01/2013   SpO2 99%   Physical Exam Vitals and nursing note reviewed.  Constitutional:  General: She is not in acute distress.    Appearance: She is well-developed.  HENT:     Head: Normocephalic and atraumatic.  Eyes:     Conjunctiva/sclera: Conjunctivae normal.  Cardiovascular:     Rate and Rhythm: Normal rate and regular rhythm.     Heart sounds: No murmur heard.   Pulmonary:     Effort: Pulmonary effort is normal. No respiratory distress.     Breath sounds: Normal breath sounds.  Abdominal:     Palpations: Abdomen is soft.     Tenderness: There is no abdominal tenderness.  Musculoskeletal:        General: Swelling present.     Cervical back: Neck supple.     Comments: Tenderness to palpation left lower back.  Some pain in the back area with movement of the left lower extremity.  Neurovascularly intact distally.  Dorsalis pedis pulse 1+.  Good feeling to all toes of the left foot.  Skin:    General: Skin is warm and dry.     Capillary Refill: Capillary refill takes less than 2 seconds.  Neurological:     General: No focal deficit present.     Mental Status: She is alert and oriented to person, place, and time.     Cranial Nerves: No cranial nerve deficit.     Sensory: No sensory deficit.     Motor:  No weakness.     ED Results / Procedures / Treatments   Labs (all labs ordered are listed, but only abnormal results are displayed) Labs Reviewed - No data to display  EKG None  Radiology CT Lumbar Spine Wo Contrast  Result Date: 01/18/2021 CLINICAL DATA:  Low back pain with left leg pain. Progressive neurologic deficit. Prior back surgery 2 years ago EXAM: CT LUMBAR SPINE WITHOUT CONTRAST TECHNIQUE: Multidetector CT imaging of the lumbar spine was performed without intravenous contrast administration. Multiplanar CT image reconstructions were also generated. COMPARISON:  MRI lumbar spine 12/19/2017 FINDINGS: Segmentation: Normal Alignment: Mild anterolisthesis L4-5. Vertebrae: Negative for fracture or mass. Hardware: Bilateral pedicle screw and rod fusion and interbody fusion L3 through L5. Hardware in satisfactory position. No hardware failure. Paraspinal and other soft tissues: No paraspinous mass or adenopathy. Disc levels: T12-L1: Negative L1-2: Negative L2-3: Mild disc bulging and mild facet degeneration. Negative for stenosis. L3-4: Pedicle screw and interbody fusion. Negative for stenosis. Bilateral facet hypertrophy. Solid interbody fusion L4-5: Mild anterolisthesis. Pedicle screw and interbody fusion with posterior decompression. Negative for stenosis. Solid interbody fusion L5-S1: Disc degeneration with gas in the disc space. There is streak artifact in the canal at this level however there is a probable moderate to large central disc protrusion. This appears to be compressing the thecal sac. Bilateral facet hypertrophy. Subarticular stenosis bilaterally IMPRESSION: Pedicle screw and solid interbody fusion L3-4 L4-5 There is a probable moderate to large central disc protrusion at L5-S1 compressing the thecal sac. Recommend MRI to confirm given the amount of streak artifact on the current CT. Electronically Signed   By: Franchot Gallo M.D.   On: 01/18/2021 09:04    Procedures Procedures    Medications Ordered in ED Medications - No data to display  ED Course  I have reviewed the triage vital signs and the nursing notes.  Pertinent labs & imaging results that were available during my care of the patient were reviewed by me and considered in my medical decision making (see chart for details).    MDM Rules/Calculators/A&P  CT shows herniated disc at L5-S1.  Patient without any cauda equina syndrome symptoms.  Will start on steroids and have her follow back up with her neurosurgeon Dr. Arnoldo Morale.  If this does not improve over the next 10 days.  Patient may require MRI at that time.  No acute reason for MRI today.     Final Clinical Impression(s) / ED Diagnoses Final diagnoses:  Sciatica of left side    Rx / DC Orders ED Discharge Orders         Ordered    predniSONE (DELTASONE) 10 MG tablet  Daily        01/18/21 0932    traMADol (ULTRAM) 50 MG tablet  Every 6 hours PRN        01/18/21 4497           Fredia Sorrow, MD 01/18/21 [phone]

## 2021-01-30 ENCOUNTER — Emergency Department (HOSPITAL_COMMUNITY)
Admission: EM | Admit: 2021-01-30 | Discharge: 2021-01-30 | Disposition: A | Discharge status: Home / Self Care | Payer: Medicare Other | Attending: Emergency Medicine | Admitting: Emergency Medicine

## 2021-01-30 ENCOUNTER — Encounter (HOSPITAL_COMMUNITY): Payer: Self-pay | Admitting: Emergency Medicine

## 2021-01-30 DIAGNOSIS — I2510 Atherosclerotic heart disease of native coronary artery without angina pectoris: Secondary | ICD-10-CM | POA: Diagnosis not present

## 2021-01-30 DIAGNOSIS — Z79899 Other long term (current) drug therapy: Secondary | ICD-10-CM | POA: Insufficient documentation

## 2021-01-30 DIAGNOSIS — M5416 Radiculopathy, lumbar region: Secondary | ICD-10-CM | POA: Diagnosis not present

## 2021-01-30 DIAGNOSIS — E1169 Type 2 diabetes mellitus with other specified complication: Secondary | ICD-10-CM | POA: Diagnosis not present

## 2021-01-30 DIAGNOSIS — Z7984 Long term (current) use of oral hypoglycemic drugs: Secondary | ICD-10-CM | POA: Insufficient documentation

## 2021-01-30 DIAGNOSIS — E785 Hyperlipidemia, unspecified: Secondary | ICD-10-CM | POA: Diagnosis not present

## 2021-01-30 DIAGNOSIS — M545 Low back pain, unspecified: Secondary | ICD-10-CM | POA: Diagnosis present

## 2021-01-30 DIAGNOSIS — M5442 Lumbago with sciatica, left side: Secondary | ICD-10-CM | POA: Insufficient documentation

## 2021-01-30 DIAGNOSIS — I10 Essential (primary) hypertension: Secondary | ICD-10-CM | POA: Insufficient documentation

## 2021-01-30 DIAGNOSIS — M5432 Sciatica, left side: Secondary | ICD-10-CM

## 2021-01-30 MED ORDER — ACETAMINOPHEN ER 650 MG PO TBCR: 650.0000 mg | EXTENDED_RELEASE_TABLET | Freq: Three times a day (TID) | ORAL | 0 refills | Status: AC | PRN

## 2021-01-30 MED ORDER — HYDROCODONE-ACETAMINOPHEN 5-325 MG PO TABS
1.0000 | ORAL_TABLET | Freq: Once | ORAL | Status: AC
  Administered 2021-01-30: 1 via ORAL
  Filled 2021-01-30: qty 1

## 2021-01-30 MED ORDER — NAPROXEN 500 MG PO TABS
500.0000 mg | ORAL_TABLET | Freq: Once | ORAL | Status: AC
  Administered 2021-01-30: 500 mg via ORAL
  Filled 2021-01-30: qty 1

## 2021-01-30 MED ORDER — DIAZEPAM 5 MG PO TABS
5.0000 mg | ORAL_TABLET | Freq: Once | ORAL | Status: AC
  Administered 2021-01-30: 5 mg via ORAL
  Filled 2021-01-30: qty 1

## 2021-01-30 MED ORDER — IBUPROFEN 600 MG PO TABS: 600.0000 mg | ORAL_TABLET | Freq: Four times a day (QID) | ORAL | 0 refills | Status: DC

## 2021-01-30 MED ORDER — METHOCARBAMOL 500 MG PO TABS: 500.0000 mg | ORAL_TABLET | Freq: Three times a day (TID) | ORAL | 0 refills | Status: DC | PRN

## 2021-01-30 NOTE — ED Provider Notes (Signed)
[facility] DEPT Provider Note   CSN: [id number] Arrival date & time: 01/30/21  5809     History Chief Complaint  Patient presents with  . Hip Pain  . Leg Pain    Michele Wood is a 60 y.o. female.  HPI     SUBJECTIVE:  Michele Wood is a 60 y.o. female who complains of low back pain. The pain is constant, but worse with certain positions and with walking and is present with radiation down the legs. Symptoms have been constant for a month now. Prior history of back problems: previous osteoarthritis of lumbar spine and previous herniated disc. There is intermittent numbness in the legs.  Pt has no associated bilateral numbness, weakness, urinary incontinence, urinary retention, bowel incontinence, pins and needle sensation in the perineal area.   Past Medical History:  Diagnosis Date  . Chronic back pain   . DDD (degenerative disc disease), cervical   . Depression   . Diabetes mellitus without complication ([place])   . Hypertension    at age 26  . Obesity   . Pancreatic cyst 05/31/2019   Incidental - discovered on CT scan  . s/p minimally-invasive resection of left atrial myxoma 06/08/2019  . Sinusitis   . Sleep apnea    at age 72    Patient Active Problem List   Diagnosis Date Noted  . Hyperlipidemia associated with type 2 diabetes mellitus ([place]) 02/28/2020  . Hypotension 07/19/2019  . CAD (coronary artery disease) 06/23/2019  . S/P minimally-invasive resection of left atrial myxoma 06/08/2019  . Pancreatic cyst 05/31/2019  . Spondylolisthesis of lumbar region 03/31/2019  . Atrial myxoma 03/19/2019  . Dyslipidemia, goal LDL below 70 12/16/2018  . Family history of heart disease 12/16/2018  . Left bundle branch block 12/16/2018  . History of chest pain 12/16/2018  . MDD (major depressive disorder), recurrent episode, moderate ([place]) 10/19/2018  . Non-insulin treated type 2 diabetes mellitus ([place]) 12/12/2017  . Morbid obesity  ([place]) 09/12/2017  . Anxiety and depression 06/13/2017  . Cervical radiculopathy due to intervertebral disc disorder 06/13/2017  . Chronic low back pain without sciatica 09/18/2015  . DJD (degenerative joint disease), lumbar 06/08/2015  . Vitamin D insufficiency 04/25/2014  . HTN (hypertension) 06/21/2008    Past Surgical History:  Procedure Laterality Date  . ABDOMINAL HYSTERECTOMY    . BACK SURGERY    . BREAST BIOPSY Right 07/02/2006   [place] Core benign  . MINIMALLY INVASIVE EXCISION OF ATRIAL MYXOMA N/A 06/08/2019   Procedure: MINIMALLY INVASIVE RESECTION OF LEFT ATRIAL MYXOMA;  Surgeon: [name], MD;  Location: [facility];  Service: Open Heart Surgery;  Laterality: N/A;  . NASAL SINUS SURGERY    . TEE WITHOUT CARDIOVERSION N/A 05/17/2019   Procedure: TRANSESOPHAGEAL ECHOCARDIOGRAM (TEE);  Surgeon: Acie Fredrickson Wonda Cheng, MD;  Location: [facility];  Service: Cardiovascular;  Laterality: N/A;  . TEE WITHOUT CARDIOVERSION N/A 06/08/2019   Procedure: TRANSESOPHAGEAL ECHOCARDIOGRAM (TEE);  Surgeon: [name], MD;  Location: [facility];  Service: Open Heart Surgery;  Laterality: N/A;     OB History   No obstetric history on file.     Family History  Problem Relation Age of Onset  . Hypertension Mother   . Hypertension Other   . Schizophrenia Brother     Social History   Tobacco Use  . Smoking status: Never Smoker  . Smokeless tobacco: Never Used  Vaping Use  . Vaping Use: Never used  Substance Use Topics  .  Alcohol use: Yes    Alcohol/week: 0.0 standard drinks    Comment: rare  . Drug use: No    Home Medications Prior to Admission medications   Medication Sig Start Date End Date Taking? Authorizing Provider  acetaminophen (TYLENOL 8 HOUR) 650 MG CR tablet Take 1 tablet (650 mg total) by mouth every 8 (eight) hours as needed for pain or fever. 01/30/21  Yes Varney Biles, MD  ibuprofen (ADVIL) 600 MG tablet Take 1 tablet (600 mg total) by mouth 4 (four) times daily.  01/30/21  Yes Varney Biles, MD  methocarbamol (ROBAXIN) 500 MG tablet Take 1 tablet (500 mg total) by mouth every 8 (eight) hours as needed for muscle spasms. 01/30/21  Yes Charlena Haub, MD  amLODipine (NORVASC) 10 MG tablet Take 1 tablet (10 mg total) by mouth daily. 12/18/20   Ladell Pier, MD  ASPIRIN LOW DOSE 81 MG EC tablet TAKE 1 TABLET BY MOUTH EVERY DAY 01/10/20   Ladell Pier, MD  atorvastatin (LIPITOR) 40 MG tablet Take 1 tablet (40 mg total) by mouth daily. 12/18/20   Ladell Pier, MD  Cholecalciferol (VITAMIN D3) 2000 UNITS TABS Take 2,000 Units by mouth daily. Patient taking differently: Take 2,000 Units by mouth 2 (two) times a week.  09/19/15   Funches, Adriana Mccallum, MD  DULoxetine (CYMBALTA) 60 MG capsule Take 1 capsule (60 mg total) by mouth daily. 11/15/20   [name], MD  glucose blood (CONTOUR NEXT TEST) test strip Use as instructed 12/18/20   Ladell Pier, MD  hydrochlorothiazide (HYDRODIURIL) 25 MG tablet Take 1 tablet (25 mg total) by mouth daily. 08/18/20   Ladell Pier, MD  hydrOXYzine (VISTARIL) 25 MG capsule Take 1 capsule (25 mg total) by mouth at bedtime as needed for anxiety. 11/15/20   Arfeen, Arlyce Harman, MD  Lurasidone HCl 60 MG TABS Take 1 tablet (60 mg total) by mouth daily with breakfast. 11/15/20   Arfeen, Arlyce Harman, MD  metFORMIN (GLUCOPHAGE) 500 MG tablet TAKE 1 TABLET BY MOUTH EVERY DAY WITH BREAKFAST 12/18/20   Ladell Pier, MD  metoprolol tartrate (LOPRESSOR) 50 MG tablet Take 1 tablet (50 mg total) by mouth 2 (two) times daily. 12/18/20   Ladell Pier, MD  montelukast (SINGULAIR) 10 MG tablet Take 1 tablet (10 mg total) by mouth at bedtime. Patient taking differently: Take 10 mg by mouth daily.  08/22/16   Funches, Adriana Mccallum, MD  mupirocin ointment (BACTROBAN) 2 % APPLY NASALLY TWICE A DAY FOR 5 DAYS 06/04/19   [provider]  predniSONE (DELTASONE) 10 MG tablet Take 4 tablets (40 mg total) by mouth daily. 01/18/21   Fredia Sorrow,  MD  traMADol (ULTRAM) 50 MG tablet Take 1 tablet (50 mg total) by mouth every 6 (six) hours as needed for moderate pain. 01/18/21   Fredia Sorrow, MD  triamcinolone (NASACORT AQ) 55 MCG/ACT AERO nasal inhaler Place 2 sprays into the nose daily. 08/22/16   Funches, Adriana Mccallum, MD    Allergies    Codeine sulfate and Milk-related compounds  Review of Systems   Review of Systems  Constitutional: Positive for activity change.  Musculoskeletal: Positive for back pain.  Allergic/Immunologic: Negative for immunocompromised state.  Neurological: Negative for weakness.  Hematological: Does not bruise/bleed easily.    Physical Exam Updated Vital Signs BP (!) 120/99 (BP Location: Left Arm)   Pulse 94   Temp 98.9 F (37.2 C) (Oral)   Resp 18   LMP 09/01/2013   SpO2  100%   Physical Exam Vitals and nursing note reviewed.  Constitutional:      Appearance: She is well-developed.  HENT:     Head: Normocephalic and atraumatic.  Cardiovascular:     Rate and Rhythm: Normal rate.  Pulmonary:     Effort: Pulmonary effort is normal.  Abdominal:     General: Bowel sounds are normal.  Musculoskeletal:     Cervical back: Normal range of motion and neck supple.     Comments: Lumbosacral spine area reveals no local tenderness or mass. Painful and reduced LS ROM noted. Straight leg raise is positive at 60 degrees on left. DTR's, motor strength and sensation normal. Peripheral pulses are palpable. Lumbar spine X-Ray: not indicated.   Skin:    General: Skin is warm and dry.  Neurological:     Mental Status: She is alert and oriented to person, place, and time.     ED Results / Procedures / Treatments   Labs (all labs ordered are listed, but only abnormal results are displayed) Labs Reviewed - No data to display  EKG None  Radiology No results found.  Procedures Procedures   Medications Ordered in ED Medications  naproxen (NAPROSYN) tablet 500 mg (500 mg Oral Given 01/30/21 1008)   HYDROcodone-acetaminophen (NORCO/VICODIN) 5-325 MG per tablet 1 tablet (1 tablet Oral Given 01/30/21 1007)  diazepam (VALIUM) tablet 5 mg (5 mg Oral Given 01/30/21 1008)    ED Course  I have reviewed the triage vital signs and the nursing notes.  Pertinent labs & imaging results that were available during my care of the patient were reviewed by me and considered in my medical decision making (see chart for details).    MDM Rules/Calculators/A&P                          60 year old female comes in with chief complaint of back pain.  Pain is radiating towards the left hip and to the left toes.  There is intermittent numbness.  Pain is only better when she is laying on the left side.  Physicians and ambulation makes her pain worse.  She was seen in the ER a few days back and had a CT scan that showed disc disease in the lower lumbar spine with protrusion.  She had received tramadol and prednisone and reports no significant improvement.  Patient has not seen PCP.  No red flags on history or exam suggesting cord compression or cauda equina.  ASSESSMENT:  herniated disc likely at L5-S1  PLAN: For acute pain, rest, intermittent application of cold packs (later, may switch to heat, but do not sleep on heating pad), analgesics and muscle relaxants are recommended. Discussed longer term treatment plan of prn NSAID's and discussed a home back care exercise program with flexion exercise routine. Proper lifting with avoidance of heavy lifting discussed. Consider Physical Therapy and XRay studies if not improving. Call or return to clinic prn if these symptoms worsen or fail to improve as anticipated. Final Clinical Impression(s) / ED Diagnoses Final diagnoses:  Sciatica of left side  Lumbar radiculopathy    Rx / DC Orders ED Discharge Orders         Ordered    ibuprofen (ADVIL) 600 MG tablet  4 times daily        01/30/21 1019    acetaminophen (TYLENOL 8 HOUR) 650 MG CR tablet  Every 8 hours PRN         01/30/21 1019  methocarbamol (ROBAXIN) 500 MG tablet  Every 8 hours PRN        01/30/21 1019           Varney Biles, MD 01/30/21 1026

## 2021-01-30 NOTE — Discharge Instructions (Addendum)
Perform the back exercises that are recommended and take the medications that are prescribed.  As discussed, please see your primary care doctor in 1 week if not getting better.

## 2021-01-30 NOTE — ED Triage Notes (Signed)
Patient here from home reporting left hip pain radiating down left leg into foot. Reports that she was seen for same at [facility] with no relief. Denies trauma.

## 2021-02-01 ENCOUNTER — Encounter (HOSPITAL_COMMUNITY): Payer: Self-pay | Admitting: Psychiatry

## 2021-02-01 ENCOUNTER — Telehealth (INDEPENDENT_AMBULATORY_CARE_PROVIDER_SITE_OTHER): Payer: Medicare Other | Admitting: Psychiatry

## 2021-02-01 ENCOUNTER — Other Ambulatory Visit: Payer: Self-pay

## 2021-02-01 DIAGNOSIS — F4310 Post-traumatic stress disorder, unspecified: Secondary | ICD-10-CM | POA: Diagnosis not present

## 2021-02-01 DIAGNOSIS — F331 Major depressive disorder, recurrent, moderate: Secondary | ICD-10-CM | POA: Diagnosis not present

## 2021-02-01 MED ORDER — LURASIDONE HCL 60 MG PO TABS: 60.0000 mg | ORAL_TABLET | Freq: Every day | ORAL | 2 refills | Status: DC

## 2021-02-01 MED ORDER — HYDROXYZINE PAMOATE 25 MG PO CAPS: 25.0000 mg | ORAL_CAPSULE | Freq: Every evening | ORAL | 2 refills | Status: DC | PRN

## 2021-02-01 MED ORDER — DULOXETINE HCL 60 MG PO CPEP: 60.0000 mg | ORAL_CAPSULE | Freq: Every day | ORAL | 2 refills | Status: DC

## 2021-02-01 NOTE — Progress Notes (Signed)
Virtual Visit via Telephone Note  I connected with Michele Wood on 02/01/21 at  2:00 PM EDT by telephone and verified that I am speaking with the correct person using two identifiers.  Location: Patient: Home Provider: Office   I discussed the limitations, risks, security and privacy concerns of performing an evaluation and management service by telephone and the availability of in person appointments. I also discussed with the patient that there may be a patient responsible charge related to this service. The patient expressed understanding and agreed to proceed.   History of Present Illness: Patient is evaluated by phone session.  She is taking acute on Cymbalta and hydroxyzine.  Recently she had back pain and needed to be seen in the emergency room.  She was given muscle relaxant.  She is feeling better but still has a lot of pain and not able to walk.  She had a support from husband and son who lives with her.  Patient denies any irritability, anger, crying spells or any feeling of hopelessness or worthlessness.  She is sleeping better and denies any nightmares or flashbacks.  Her energy level is fair.  She has no tremors shakes or any EPS.  She denies any panic attack.  Patient does not want to change the medication since she feels they are working.  Past Psychiatric History:Reviewed. H/Oparanoia, hallucination, depression since childhood. Witnessed Therapist, music. H/Onightmare and flashback. TriedProzac and Haldol.No h/o inpatient or suicidal attempt.   Psychiatric Specialty Exam: Physical Exam  Review of Systems  Weight 258 lb (117 kg), last menstrual period 09/01/2013.There is no height or weight on file to calculate BMI.  General Appearance: NA  Eye Contact:  NA  Speech:  Clear and Coherent and Slow  Volume:  Decreased  Mood:  Dysphoric  Affect:  NA  Thought Process:  Goal Directed  Orientation:  Full (Time, Place, and Person)  Thought  Content:  WDL  Suicidal Thoughts:  No  Homicidal Thoughts:  No  Memory:  Immediate;   Good Recent;   Good Remote;   Good  Judgement:  Intact  Insight:  Present  Psychomotor Activity:  NA  Concentration:  Concentration: Fair and Attention Span: Fair  Recall:  Good  Fund of Knowledge:  Good  Language:  Good  Akathisia:  No  Handed:  Right  AIMS (if indicated):     Assets:  Communication Skills Desire for Improvement Housing Resilience Social Support  ADL's:  Intact  Cognition:  WNL  Sleep:   Okay      Assessment and Plan: PTSD.  Major depressive disorder, recurrent.  Patient is stable on her current medication.  She recently had an episode of severe back pain and taking muscle relaxant.  Discussed medication side effects and benefits.  Continue Latuda 60 mg daily, Cymbalta 60 mg daily and hydroxyzine 25 mg at bedtime.  Patient is not interested in therapy.  Recommended to call us back if she has any question or any concern.  Follow-up in 3 months.  Follow Up Instructions:    I discussed the assessment and treatment plan with the patient. The patient was provided an opportunity to ask questions and all were answered. The patient agreed with the plan and demonstrated an understanding of the instructions.   The patient was advised to call back or seek an in-person evaluation if the symptoms worsen or if the condition fails to improve as anticipated.  I provided 14 minutes of non-face-to-face time during this encounter.  [name], MD

## 2021-02-07 ENCOUNTER — Ambulatory Visit (INDEPENDENT_AMBULATORY_CARE_PROVIDER_SITE_OTHER): Payer: Medicare Other | Admitting: Psychology

## 2021-02-07 DIAGNOSIS — F3289 Other specified depressive episodes: Secondary | ICD-10-CM | POA: Diagnosis not present

## 2021-02-09 ENCOUNTER — Encounter: Payer: Medicare Other | Admitting: Internal Medicine

## 2021-02-12 ENCOUNTER — Telehealth (HOSPITAL_COMMUNITY): Payer: Medicare Other | Admitting: Psychiatry

## 2021-02-13 ENCOUNTER — Ambulatory Visit: Payer: Medicare Other | Attending: Internal Medicine | Admitting: Internal Medicine

## 2021-02-13 ENCOUNTER — Other Ambulatory Visit: Payer: Self-pay

## 2021-02-13 ENCOUNTER — Encounter: Payer: Self-pay | Admitting: Internal Medicine

## 2021-02-13 VITALS — BP 137/90 | HR 75 | Resp 16 | Ht 61.5 in | Wt 259.4 lb

## 2021-02-13 DIAGNOSIS — K029 Dental caries, unspecified: Secondary | ICD-10-CM

## 2021-02-13 DIAGNOSIS — Z0001 Encounter for general adult medical examination with abnormal findings: Secondary | ICD-10-CM | POA: Diagnosis not present

## 2021-02-13 DIAGNOSIS — M7989 Other specified soft tissue disorders: Secondary | ICD-10-CM | POA: Diagnosis not present

## 2021-02-13 DIAGNOSIS — Z23 Encounter for immunization: Secondary | ICD-10-CM

## 2021-02-13 DIAGNOSIS — Z1211 Encounter for screening for malignant neoplasm of colon: Secondary | ICD-10-CM | POA: Diagnosis not present

## 2021-02-13 DIAGNOSIS — I10 Essential (primary) hypertension: Secondary | ICD-10-CM

## 2021-02-13 DIAGNOSIS — H6121 Impacted cerumen, right ear: Secondary | ICD-10-CM | POA: Diagnosis not present

## 2021-02-13 DIAGNOSIS — M5416 Radiculopathy, lumbar region: Secondary | ICD-10-CM

## 2021-02-13 DIAGNOSIS — Z0000 Encounter for general adult medical examination without abnormal findings: Secondary | ICD-10-CM

## 2021-02-13 MED ORDER — AMOXICILLIN-POT CLAVULANATE 500-125 MG PO TABS: 1.0000 | ORAL_TABLET | Freq: Two times a day (BID) | ORAL | 0 refills | Status: DC

## 2021-02-13 MED ORDER — TRAMADOL HCL 50 MG PO TABS: 50.0000 mg | ORAL_TABLET | Freq: Three times a day (TID) | ORAL | 0 refills | Status: DC | PRN

## 2021-02-13 MED ORDER — VALSARTAN 40 MG PO TABS: 40.0000 mg | ORAL_TABLET | Freq: Every day | ORAL | 3 refills | Status: DC

## 2021-02-13 MED ORDER — PREDNISONE 20 MG PO TABS: ORAL_TABLET | ORAL | 0 refills | Status: DC

## 2021-02-13 NOTE — Progress Notes (Signed)
Subjective:    Michele Wood is a 60 y.o. female who presents for a Welcome to Medicare exam.  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 neuropathy in feetfrom DM vs lower back), dep with psychosis. Review of Systems   Seen in ER 01/18/2021 for radicular LT side LBP.  CAT scan revealed fusion at L3-4 and L4-5 in place.  There was probable moderate to large central disc protrusion at L5-S1 compressing the thecal sac.  She was discharged home with prednisone and tramadol.  Patient reports no initiating factors.  She continues to have pain in the left lower back that radiates to buttock and leg.  + numbness/tingling in pubic area.  No loss of bowel or bladder.  Some weakness in LT legs, no falls Taking Ibuprofen TID She has not contacted her neurosurgeon Dr. Arnoldo Morale as yet.  She was waiting to see me to get a referral.  C/O soreness swelling in LT foot/ankle x 2 wks.  Soreness mainly in toes.  No initiating factors.  Happens when she has back pain.  Some redness and "fever in it."  Swelling has decreased some.  BP elev today.  She reports compliance with taking her medications that include hydrochlorothiazide, amlodipine and metoprolol.  She states that she has taken her medicines already for today.      Objective:    Today's Vitals   02/13/21 1449 02/13/21 1549  BP: 124/90 137/90  Pulse: 75   Resp: 16   SpO2: 97%   Weight: 259 lb 6.4 oz (117.7 kg)   Height: 5' 1.5" (1.562 m)   Body mass index is 48.22 kg/m.  Constitutional: Older African-American female, obese and in NAD.  She is alert and oriented x3 and follows commands appropriately.. Head: Normocephalic. Atraumatic Ears: Moderate amount of soft wax buildup in the right ear.  Left ear canal and tympanic membrane within normal limits.     Eyes: Conjunctivae and EOM are normal.  PERRLA, no scleral icterus.  Mouth: no oral lesions, good oral hygiene, throat clear without exudates.  She is noted to have 2 chipped teeth that looks like cavities both in the upper jaw on the left side. Neck: Neck supple.  No tracheal deviation. No thyromegaly. No cervical LN CVS: RRR, S1/S2 +, no murmurs, no gallops, no carotid bruit. No JVD Pulmonary: Effort and breath sounds normal, no stridor, rhonchi, wheezes, rales.  Musculoskeletal: Patient is ambulating with a cane.  Left foot is moderately swollen on the dorsal surface.  Mild warmth.  No erythema noted.  No exquisite tenderness.  She has good range of motion of the ankle joint in all directions without discomfort. Neuro: Power in the lower extremities proximally and distally 5/5 bilaterally.  Gross sensation intact in both lower extremities.  Straight leg raise not done.   Medications Outpatient Encounter Medications as of 02/13/2021  Medication Sig  . amoxicillin-clavulanate (AUGMENTIN) 500-125 MG tablet Take 1 tablet (500 mg total) by mouth 2 (two) times daily.  . predniSONE (DELTASONE) 20 MG tablet 1 tab PO daily x 2 days then 1/2 tab PO x 4 days  . valsartan (DIOVAN) 40 MG tablet Take 1 tablet (40 mg total) by mouth daily. For blood pressure  . acetaminophen (TYLENOL 8 HOUR) 650 MG CR tablet Take 1 tablet (650 mg total) by mouth every 8 (eight) hours as needed for  pain or fever.  Marland Kitchen amLODipine (NORVASC) 10 MG tablet Take 1 tablet (10 mg total) by mouth daily.  . ASPIRIN LOW DOSE 81 MG EC tablet TAKE 1 TABLET BY MOUTH EVERY DAY  . atorvastatin (LIPITOR) 40 MG tablet Take 1 tablet (40 mg total) by mouth daily.  . Cholecalciferol (VITAMIN D3) 2000 UNITS TABS Take 2,000 Units by mouth daily. (Patient taking differently: Take 2,000 Units by mouth 2 (two) times a week. )  . DULoxetine (CYMBALTA) 60 MG capsule Take 1 capsule (60 mg total) by mouth daily.  Marland Kitchen glucose blood (CONTOUR NEXT TEST) test strip Use as instructed  .  hydrochlorothiazide (HYDRODIURIL) 25 MG tablet Take 1 tablet (25 mg total) by mouth daily.  . hydrOXYzine (VISTARIL) 25 MG capsule Take 1 capsule (25 mg total) by mouth at bedtime as needed for anxiety.  Marland Kitchen ibuprofen (ADVIL) 600 MG tablet Take 1 tablet (600 mg total) by mouth 4 (four) times daily.  . Lurasidone HCl 60 MG TABS Take 1 tablet (60 mg total) by mouth daily with breakfast.  . metFORMIN (GLUCOPHAGE) 500 MG tablet TAKE 1 TABLET BY MOUTH EVERY DAY WITH BREAKFAST  . methocarbamol (ROBAXIN) 500 MG tablet Take 1 tablet (500 mg total) by mouth every 8 (eight) hours as needed for muscle spasms.  . metoprolol tartrate (LOPRESSOR) 50 MG tablet Take 1 tablet (50 mg total) by mouth 2 (two) times daily.  . montelukast (SINGULAIR) 10 MG tablet Take 1 tablet (10 mg total) by mouth at bedtime. (Patient taking differently: Take 10 mg by mouth daily. )  . mupirocin ointment (BACTROBAN) 2 % APPLY NASALLY TWICE A DAY FOR 5 DAYS  . traMADol (ULTRAM) 50 MG tablet Take 1 tablet (50 mg total) by mouth every 8 (eight) hours as needed for moderate pain.  Marland Kitchen triamcinolone (NASACORT AQ) 55 MCG/ACT AERO nasal inhaler Place 2 sprays into the nose daily.  . [DISCONTINUED] predniSONE (DELTASONE) 10 MG tablet Take 4 tablets (40 mg total) by mouth daily.  . [DISCONTINUED] traMADol (ULTRAM) 50 MG tablet Take 1 tablet (50 mg total) by mouth every 6 (six) hours as needed for moderate pain.   No facility-administered encounter medications on file as of 02/13/2021.     History: Past Medical History:  Diagnosis Date  . Chronic back pain   . DDD (degenerative disc disease), cervical   . Depression   . Diabetes mellitus without complication ([place])   . Hypertension    at age 65  . Obesity   . Pancreatic cyst 05/31/2019   Incidental - discovered on CT scan  . s/p minimally-invasive resection of left atrial myxoma 06/08/2019  . Sinusitis   . Sleep apnea    at age 41   Past Surgical History:  Procedure Laterality Date  .  ABDOMINAL HYSTERECTOMY    . BACK SURGERY    . BREAST BIOPSY Right 07/02/2006   [place] Core benign  . MINIMALLY INVASIVE EXCISION OF ATRIAL MYXOMA N/A 06/08/2019   Procedure: MINIMALLY INVASIVE RESECTION OF LEFT ATRIAL MYXOMA;  Surgeon: [name], MD;  Location: [place];  Service: Open Heart Surgery;  Laterality: N/A;  . NASAL SINUS SURGERY    . TEE WITHOUT CARDIOVERSION N/A 05/17/2019   Procedure: TRANSESOPHAGEAL ECHOCARDIOGRAM (TEE);  Surgeon: Acie Fredrickson Wonda Cheng, MD;  Location: [facility];  Service: Cardiovascular;  Laterality: N/A;  . TEE WITHOUT CARDIOVERSION N/A 06/08/2019   Procedure: TRANSESOPHAGEAL ECHOCARDIOGRAM (TEE);  Surgeon: [name], MD;  Location: [facility];  Service: Open Heart Surgery;  Laterality: N/A;    Family History  Problem Relation Age of Onset  . Hypertension Mother   . Hypertension Other   . Schizophrenia Brother    Social History   Occupational History  . Not on file  Tobacco Use  . Smoking status: Never Smoker  . Smokeless tobacco: Never Used  Vaping Use  . Vaping Use: Never used  Substance and Sexual Activity  . Alcohol use: Yes    Alcohol/week: 0.0 standard drinks    Comment: rare  . Drug use: No  . Sexual activity: Not Currently    Tobacco Counseling Counseling given: Not Answered   Immunizations and Health Maintenance Immunization History  Administered Date(s) Administered  . Influenza Inj Mdck Quad With Preservative 08/27/2019  . Influenza Whole 09/13/2008, 08/29/2019  . Influenza,inj,Quad PF,6+ Mos 09/22/2014, 09/18/2015, 08/22/2016, 06/13/2017, 06/19/2018, 08/18/2020  . PFIZER(Purple Top)SARS-COV-2 Vaccination 01/25/2020, 02/22/2020  . Pneumococcal Polysaccharide-23 10/19/2018  . Tdap 09/03/2016   Over due for COVID booster.  Plans to get it. Needs Shingrix  Health Maintenance Due  Topic Date Due  . COVID-19 Vaccine (3 - Booster for Pfizer series) 08/24/2020  . Fecal DNA (Cologuard)  12/24/2020  Prefers MMG Q 2 yrs. Due for  repeat cologuard  Activities of Daily Living In your present state of health, do you have any difficulty performing the following activities: 02/13/2021  Hearing? Y  Vision? Y  Difficulty concentrating or making decisions? N  Walking or climbing stairs? Y  Dressing or bathing? N  Doing errands, shopping? N  Preparing Food and eating ? N  Using the Toilet? N  In the past six months, have you accidently leaked urine? Y  Do you have problems with loss of bowel control? N  Managing your Medications? N  Managing your Finances? N  Housekeeping or managing your Housekeeping? N  Some recent data might be hidden  Hearing problem for a few yrs.  Feels it is worse.  Mainly RT side.  Has to turn up volume on phone and TV. Wears glasses for distance.  Last eye exam 11/2020 Urine leakage: occasional when she laughs. Does not wear pads Uses cane for walking up steps.    Advanced Directives: Does Patient Have a Medical Advance Directive?: No Would patient like information on creating a medical advance directive?: No - Patient declined    Assessment:    This is a routine wellness examination for this patient .  Vision/Hearing screen Whisper test is normal.  Hearing Screening   125Hz  250Hz  500Hz  1000Hz  2000Hz  3000Hz  4000Hz  6000Hz  8000Hz   Right ear:           Left ear:             Visual Acuity Screening   Right eye Left eye Both eyes  Without correction: 20 15 20 15 20 15   With correction:       Dietary issues and exercise activities discussed:     Goals    . Activity and Exercise Increased     I would like to be able to walk and get around a little better.      Depression Screen PHQ 2/9 Scores 02/13/2021 11/01/2019 10/19/2018 06/19/2018  PHQ - 2 Score 2 2 2 2   PHQ- 9 Score 6 10 12 12   Plugged into BH services - sees a counsel and a psychiatrist.  Sees psychiatrist Q 3 mths and counselor Q 1 mth. Doing good on meds  Fall Risk Fall Risk  02/13/2021  Falls in the past  year? 1  Number  falls in past yr: 1  Injury with Fall? 0  Risk for fall due to : -  Follow up Education provided    Cognitive Function: MMSE - [place] Exam 02/13/2021  Orientation to time 5  Orientation to Place 5  Registration 3  Attention/ Calculation 5  Recall 3  Language- name 2 objects 2  Language- repeat 1  Language- follow 3 step command 3  Language- read & follow direction 1  Write a sentence 1  Copy design 0  Total score 29        Patient Care Team: Ladell Pier, MD as PCP - General (Internal Medicine) Lorretta Harp, MD as PCP - Cardiology (Cardiology) Neurosurgeon: Dr. Arnoldo Morale   Psychiatrist Dr. Shona Simpson Podiatrist: Dr. Felizardo Hoffmann  Plan:    1. Encounter for Medicare annual wellness exam   2. Essential hypertension Not at goal.  Advised patient to stop ibuprofen.  This can adversely affect blood pressure.  However in looking at her blood pressure chart, I see that her diastolic blood pressure has persistently been elevated.  We will add low-dose of valsartan.  Continue other blood pressure medications including hydrochlorothiazide, Norvasc and metoprolol.  DASH diet encouraged.  We will recheck blood pressure on subsequent visit. - valsartan (DIOVAN) 40 MG tablet; Take 1 tablet (40 mg total) by mouth daily. For blood pressure  Dispense: 90 tablet; Refill: 3  3. Lumbar radiculopathy We will try to get her in with Dr. Arnoldo Morale as soon as possible.  Advised to avoid any lifting, pushing, pulling or bending.  I have given a limited refill on tramadol to use as needed.  She has taken tramadol in the past without problems.  [place] controlled substance reporting system reviewed.  Went over symptoms of serotonin syndrome that can occur when tramadol is used in combination with some of the antidepressants.  Patient will stop the medication if she develops any problems. - Ambulatory referral to Neurosurgery - traMADol (ULTRAM) 50 MG tablet; Take 1 tablet  (50 mg total) by mouth every 8 (eight) hours as needed for moderate pain.  Dispense: 30 tablet; Refill: 0  4. Swelling of left foot Questionable gout versus cellulitis.  Will check uric acid level today.  Put her on a course of antibiotics and low-dose prednisone.  Advised patient to keep elevated laying down is much as possible.  We will have her follow-up with me in about 2 to 3 weeks to reevaluate.  Call me if things get worse before then. - Uric Acid - amoxicillin-clavulanate (AUGMENTIN) 500-125 MG tablet; Take 1 tablet (500 mg total) by mouth 2 (two) times daily.  Dispense: 14 tablet; Refill: 0 - predniSONE (DELTASONE) 20 MG tablet; 1 tab PO daily x 2 days then 1/2 tab PO x 4 days  Dispense: 4 tablet; Refill: 0  5. Dental cavities - Ambulatory referral to Dentistry  6. Need for shingles vaccine Plan was to give the Shingrix today, however I decided to hold off given the acute issue that is going on with her foot.  We will give on subsequent office visit.  7. COVID-19 vaccine series started Encourage patient to move forward with getting the COVID booster.  Informed of places to go up to get the booster shot.  8. Screening for colon cancer - Cologuard  9. Impacted cerumen, right ear Advised to purchase some over-the-counter wax softener.  On subsequent visit we can do irrigation.  I have personally reviewed  and noted the following in the patient's chart:   . Medical and social history . Use of alcohol, tobacco or illicit drugs  . Current medications and supplements . Functional ability and status . Nutritional status . Physical activity . Advanced directives . List of other physicians . Hospitalizations, surgeries, and ER visits in previous 12 months . Vitals . Screenings to include cognitive, depression, and falls . Referrals and appointments  In addition, I have reviewed and discussed with patient certain preventive protocols, quality metrics, and best practice  recommendations. A written personalized care plan for preventive services as well as general preventive health recommendations were provided to patient.     Karle Plumber, MD 02/13/2021

## 2021-02-13 NOTE — Patient Instructions (Signed)
We will plan to give the shingles vaccine on your next visit with me.  Please keep the left foot elevated when sitting and laying down.  I have sent a prescription to the pharmacy for the antibiotics.  Your blood pressure is not controlled.  We have added another blood pressure medication called Diovan 40 mg daily.  Continue your other blood pressure medications.  Please bring all medicines with you on your subsequent visit.  I have referred you to the neurosurgeon for your back. I have given a limited supply of tramadol to use as needed for pain.

## 2021-02-14 LAB — URIC ACID: Uric Acid: 7.3 mg/dL — ABNORMAL HIGH (ref 3.0–7.2)

## 2021-02-14 NOTE — Progress Notes (Signed)
Uric acid level is slightly elevated suggesting that it may be gout in the left foot.  Please let me know if it responds to the prednisone and keeping the foot elevated as discussed yesterday.

## 2021-02-21 DIAGNOSIS — Z1211 Encounter for screening for malignant neoplasm of colon: Secondary | ICD-10-CM | POA: Diagnosis not present

## 2021-02-23 LAB — COLOGUARD: Cologuard: NEGATIVE

## 2021-03-02 LAB — COLOGUARD: COLOGUARD: NEGATIVE

## 2021-03-05 ENCOUNTER — Ambulatory Visit: Payer: Medicare (Managed Care) | Admitting: Podiatry

## 2021-03-07 ENCOUNTER — Other Ambulatory Visit: Payer: Self-pay

## 2021-03-07 ENCOUNTER — Telehealth: Payer: Self-pay | Admitting: Internal Medicine

## 2021-03-07 ENCOUNTER — Ambulatory Visit (INDEPENDENT_AMBULATORY_CARE_PROVIDER_SITE_OTHER): Payer: Medicare Other | Admitting: Psychology

## 2021-03-07 DIAGNOSIS — F3289 Other specified depressive episodes: Secondary | ICD-10-CM | POA: Diagnosis not present

## 2021-03-07 NOTE — Telephone Encounter (Signed)
Contacted pt to go over cologuard results pt didn't answer and was unable to lvm due to phone kept ringing

## 2021-03-15 ENCOUNTER — Telehealth: Payer: Self-pay | Admitting: Internal Medicine

## 2021-03-15 NOTE — Telephone Encounter (Signed)
Copied from [place] [id number]. Topic: General - Inquiry >> Mar 15, 2021  9:26 AM Oneta Rack wrote: Osvaldo Human name: Mercy Moore  Relation to pt: from [facility] advocate Call back number: [phone] fax # [phone]    Reason for call:  Checking on the status of the Genetic health advocate for a cardiac assessment test form was faxed on 03/13/2021, form was faxed to [phone]. Caller will re fax again please note when received   Have you received this paperwork?

## 2021-03-16 NOTE — Telephone Encounter (Signed)
We have received paperwork and Dr. Wynetta Emery will take a look at on her admin day paperwork takes 7-10 business days

## 2021-03-27 ENCOUNTER — Ambulatory Visit: Payer: Medicare Other | Admitting: Internal Medicine

## 2021-03-30 NOTE — Telephone Encounter (Signed)
Michele Wood called to fallow up on paperwork. Avised her of JP 's response from 03/16/21

## 2021-04-03 NOTE — Telephone Encounter (Signed)
Genetic health advocate checking on the status of the cardiac assessment test form was faxed on 03/13/2021/ followed up on 6.2.22 and was advised of 7-10 day period/ she is calling to see if forms have been completed / please call and confirm

## 2021-04-04 ENCOUNTER — Ambulatory Visit (INDEPENDENT_AMBULATORY_CARE_PROVIDER_SITE_OTHER): Payer: Medicare Other | Admitting: Psychology

## 2021-04-04 DIAGNOSIS — F3289 Other specified depressive episodes: Secondary | ICD-10-CM | POA: Diagnosis not present

## 2021-04-04 NOTE — Telephone Encounter (Signed)
Returned call and spoke to [employer]

## 2021-05-07 ENCOUNTER — Other Ambulatory Visit: Payer: Self-pay

## 2021-05-07 ENCOUNTER — Telehealth (HOSPITAL_COMMUNITY): Payer: Medicare Other | Admitting: Psychiatry

## 2021-05-09 ENCOUNTER — Ambulatory Visit: Payer: Medicare Other | Admitting: Psychology

## 2021-05-31 ENCOUNTER — Other Ambulatory Visit (HOSPITAL_COMMUNITY): Payer: Self-pay | Admitting: Psychiatry

## 2021-05-31 DIAGNOSIS — F4310 Post-traumatic stress disorder, unspecified: Secondary | ICD-10-CM

## 2021-05-31 DIAGNOSIS — F331 Major depressive disorder, recurrent, moderate: Secondary | ICD-10-CM

## 2021-06-14 ENCOUNTER — Telehealth: Payer: Self-pay

## 2021-06-14 NOTE — Telephone Encounter (Signed)
Pt requested an appt for Foot pain and Leg pain  Contacted pt to schedule an appt pt didn't answer was unable to lvm due to phone going to busy signal

## 2021-11-13 ENCOUNTER — Telehealth (HOSPITAL_COMMUNITY): Payer: Self-pay | Admitting: *Deleted

## 2021-11-15 NOTE — Telephone Encounter (Signed)
VM left for pt regarding wether or not pt would like to continue seeing provider in this office or would she like to be closed out.

## 2021-12-05 ENCOUNTER — Ambulatory Visit: Payer: Medicare Other | Attending: Physician Assistant | Admitting: Physician Assistant

## 2021-12-05 DIAGNOSIS — E785 Hyperlipidemia, unspecified: Secondary | ICD-10-CM | POA: Diagnosis not present

## 2021-12-05 DIAGNOSIS — E1169 Type 2 diabetes mellitus with other specified complication: Secondary | ICD-10-CM

## 2021-12-05 DIAGNOSIS — I10 Essential (primary) hypertension: Secondary | ICD-10-CM | POA: Diagnosis not present

## 2021-12-05 DIAGNOSIS — M5416 Radiculopathy, lumbar region: Secondary | ICD-10-CM

## 2021-12-05 DIAGNOSIS — F331 Major depressive disorder, recurrent, moderate: Secondary | ICD-10-CM | POA: Diagnosis not present

## 2021-12-05 DIAGNOSIS — R079 Chest pain, unspecified: Secondary | ICD-10-CM | POA: Diagnosis not present

## 2021-12-05 DIAGNOSIS — G5622 Lesion of ulnar nerve, left upper limb: Secondary | ICD-10-CM | POA: Diagnosis not present

## 2021-12-05 DIAGNOSIS — F4310 Post-traumatic stress disorder, unspecified: Secondary | ICD-10-CM

## 2021-12-05 DIAGNOSIS — M5010 Cervical disc disorder with radiculopathy, unspecified cervical region: Secondary | ICD-10-CM

## 2021-12-05 DIAGNOSIS — J324 Chronic pansinusitis: Secondary | ICD-10-CM | POA: Diagnosis not present

## 2021-12-05 DIAGNOSIS — E559 Vitamin D deficiency, unspecified: Secondary | ICD-10-CM

## 2021-12-05 DIAGNOSIS — Z6841 Body Mass Index (BMI) 40.0 and over, adult: Secondary | ICD-10-CM

## 2021-12-05 LAB — GLUCOSE, POCT (MANUAL RESULT ENTRY): POC Glucose: 93 mg/dl (ref 70–99)

## 2021-12-05 LAB — POCT GLYCOSYLATED HEMOGLOBIN (HGB A1C): HbA1c, POC (controlled diabetic range): 6.5 % (ref 0.0–7.0)

## 2021-12-05 MED ORDER — METOPROLOL TARTRATE 50 MG PO TABS: 50.0000 mg | ORAL_TABLET | Freq: Two times a day (BID) | ORAL | 1 refills | Status: DC

## 2021-12-05 MED ORDER — TRIAMCINOLONE ACETONIDE 55 MCG/ACT NA AERO: 2.0000 | INHALATION_SPRAY | Freq: Every day | NASAL | 12 refills | Status: DC

## 2021-12-05 MED ORDER — GABAPENTIN 300 MG PO CAPS: 300.0000 mg | ORAL_CAPSULE | Freq: Every day | ORAL | 1 refills | Status: DC

## 2021-12-05 MED ORDER — METFORMIN HCL 500 MG PO TABS: 500.0000 mg | ORAL_TABLET | Freq: Two times a day (BID) | ORAL | 1 refills | Status: DC

## 2021-12-05 MED ORDER — AMLODIPINE BESYLATE 10 MG PO TABS: 10.0000 mg | ORAL_TABLET | Freq: Every day | ORAL | 1 refills | Status: DC

## 2021-12-05 MED ORDER — ASPIRIN 81 MG PO TBEC: 81.0000 mg | DELAYED_RELEASE_TABLET | Freq: Every day | ORAL | 1 refills | Status: AC

## 2021-12-05 MED ORDER — PREDNISONE 10 MG PO TABS: 10.0000 mg | ORAL_TABLET | Freq: Every day | ORAL | 0 refills | Status: DC

## 2021-12-05 MED ORDER — HYDROXYZINE PAMOATE 25 MG PO CAPS: 25.0000 mg | ORAL_CAPSULE | Freq: Every evening | ORAL | 2 refills | Status: DC | PRN

## 2021-12-05 MED ORDER — METHOCARBAMOL 500 MG PO TABS: 500.0000 mg | ORAL_TABLET | Freq: Four times a day (QID) | ORAL | 0 refills | Status: DC | PRN

## 2021-12-05 MED ORDER — DULOXETINE HCL 60 MG PO CPEP: 60.0000 mg | ORAL_CAPSULE | Freq: Every day | ORAL | 1 refills | Status: DC

## 2021-12-05 MED ORDER — ATORVASTATIN CALCIUM 40 MG PO TABS: 40.0000 mg | ORAL_TABLET | Freq: Every day | ORAL | 3 refills | Status: DC

## 2021-12-05 MED ORDER — MONTELUKAST SODIUM 10 MG PO TABS: 10.0000 mg | ORAL_TABLET | Freq: Every day | ORAL | 1 refills | Status: DC

## 2021-12-05 MED ORDER — HYDROCHLOROTHIAZIDE 25 MG PO TABS: 25.0000 mg | ORAL_TABLET | Freq: Every day | ORAL | 3 refills | Status: DC

## 2021-12-05 MED ORDER — VALSARTAN 40 MG PO TABS: 40.0000 mg | ORAL_TABLET | Freq: Every day | ORAL | 3 refills | Status: DC

## 2021-12-05 NOTE — Progress Notes (Signed)
Patient ID: Michele Wood, female   DOB: Feb 14, 1961, 61 y.o.   MRN: [id number]   Michele Wood, is a 61 y.o. female  [id number]  [id number]  DOB - Oct 16, 1960  Chief Complaint  Patient presents with   Hip Pain       Subjective:   Michele Wood is a 61 y.o. female here today for a follow up visit  med RF.  She has a 2 month h/o 4th and 5th digit of her L hand going numb and pain in her L arm/neck/elbow and hand over the last 2 months.  She is swimming for exercise and says that is the only time it doesn't bother her.  The pain bothers her worse at night.  She is R hand dominant.  NKI.  No CP/SOB/dizziness.  The pain is almost constant with varying intensity.  Describes as dull and tingling  Also having a flare of R lower back pain down into R leg.  She has tolerated prednisone in the past without problems.    She has been out of meds for a month.  When she was on her meds, she says she would check her BP at [facility] and it would be "normal," but she does not remember numbers.    No problems updated.  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 Authorizing Provider  acetaminophen (TYLENOL 8 HOUR) 650 MG CR tablet Take 1 tablet (650 mg total) by mouth every 8 (eight) hours as needed for pain or fever. 01/30/21  Yes Varney Biles, MD  Cholecalciferol  (VITAMIN D3) 2000 UNITS TABS Take 2,000 Units by mouth daily. Patient taking differently: Take 2,000 Units by mouth 2 (two) times a week. 09/19/15  Yes Funches, Josalyn, MD  gabapentin (NEURONTIN) 300 MG capsule Take 1 capsule (300 mg total) by mouth at bedtime. For nerve pain 12/05/21  Yes Argentina Donovan, PA-C  glucose blood (CONTOUR NEXT TEST) test strip Use as instructed 12/18/20  Yes Ladell Pier, MD  ibuprofen (ADVIL) 600 MG tablet Take 1 tablet (600 mg total) by mouth 4 (four) times daily. 01/30/21  Yes Varney Biles, MD  Lurasidone HCl 60 MG TABS Take 1 tablet (60 mg total) by mouth daily with breakfast. 02/01/21  Yes Arfeen, Arlyce Harman, MD  mupirocin ointment (BACTROBAN) 2 % APPLY NASALLY TWICE A DAY FOR 5 DAYS 06/04/19  Yes [provider]  predniSONE (DELTASONE) 10 MG tablet Take 1 tablet (10 mg total) by mouth daily with breakfast. 6,5,4,3,2,1 take each days dose with food in the morning 12/05/21  Yes Kacie Huxtable M, PA-C  traMADol (ULTRAM) 50 MG tablet Take 1 tablet (50 mg total) by mouth every 8 (eight) hours as needed for moderate pain. 02/13/21  Yes Ladell Pier, MD  amLODipine (  NORVASC) 10 MG tablet Take 1 tablet (10 mg total) by mouth daily. 12/05/21   Argentina Donovan, PA-C  aspirin (ASPIRIN LOW DOSE) 81 MG EC tablet Take 1 tablet (81 mg total) by mouth daily. Swallow whole. 12/05/21   Argentina Donovan, PA-C  atorvastatin (LIPITOR) 40 MG tablet Take 1 tablet (40 mg total) by mouth daily. 12/05/21   Argentina Donovan, PA-C  DULoxetine (CYMBALTA) 60 MG capsule Take 1 capsule (60 mg total) by mouth daily. 12/05/21   Argentina Donovan, PA-C  hydrochlorothiazide (HYDRODIURIL) 25 MG tablet Take 1 tablet (25 mg total) by mouth daily. 12/05/21   Argentina Donovan, PA-C  hydrOXYzine (VISTARIL) 25 MG capsule Take 1 capsule (25 mg total) by mouth at bedtime as needed for anxiety. 12/05/21   Argentina Donovan, PA-C  metFORMIN (GLUCOPHAGE) 500 MG tablet Take 1 tablet (500 mg total) by  mouth 2 (two) times daily with a meal. TAKE 1 TABLET BY MOUTH EVERY DAY WITH BREAKFAST 12/05/21   Freeman Caldron M, PA-C  methocarbamol (ROBAXIN) 500 MG tablet Take 1 tablet (500 mg total) by mouth every 6 (six) hours as needed for muscle spasms. 12/05/21   Argentina Donovan, PA-C  metoprolol tartrate (LOPRESSOR) 50 MG tablet Take 1 tablet (50 mg total) by mouth 2 (two) times daily. 12/05/21   Argentina Donovan, PA-C  montelukast (SINGULAIR) 10 MG tablet Take 1 tablet (10 mg total) by mouth daily. 12/05/21   Argentina Donovan, PA-C  triamcinolone (NASACORT AQ) 55 MCG/ACT AERO nasal inhaler Place 2 sprays into the nose daily. 12/05/21   Argentina Donovan, PA-C  valsartan (DIOVAN) 40 MG tablet Take 1 tablet (40 mg total) by mouth daily. For blood pressure 12/05/21   Buffi Ewton, Levada Dy M, PA-C    ROS: Neg HEENT Neg resp Neg cardiac Neg GI Neg GU Neg MS Neg psych Neg neuro  Objective:   Vitals:   12/05/21 0949  BP: (!) 168/110  Pulse: 89  SpO2: 99%  Weight: 259 lb (117.5 kg)  Height: 5\' 1"  (1.549 m)   Exam General appearance : Awake, alert, not in any distress. Speech Clear. Not toxic looking HEENT: Atraumatic and NormocephalicNeck: Supple, no JVD. No cervical lymphadenopathy.  Chest: Good air entry bilaterally, CTAB.  No rales/rhonchi/wheezing CVS: S1 S2 regular, no murmurs.  R&L UE examined.  Limited ROM on L due to pain.  DTR2+ on L, 1+ on R.   DTR BLE=intact B Extremities: B/L Lower Ext shows no edema, both legs are warm to touch Neurology: Awake alert, and oriented X 3, CN II-XII intact, Non focal Skin: No Rash  Data Review Lab Results  Component Value Date   HGBA1C 6.5 12/05/2021   HGBA1C 6.0 08/18/2020   HGBA1C 5.7 11/01/2019    Assessment & Plan   1. Type 2 diabetes mellitus with morbid obesity (HCC) Increased A1C since last year-off meds for 1 month- - Glucose (CBG) - HgB A1c - metFORMIN (GLUCOPHAGE) 500 MG tablet; Take 1 tablet (500 mg total) by mouth 2 (two) times  daily with a meal. TAKE 1 TABLET BY MOUTH EVERY DAY WITH BREAKFAST  Dispense: 180 tablet; Refill: 1 - CBC with Differential/Platelet - Comprehensive metabolic panel I have had a lengthy discussion and provided education about insulin resistance and the intake of too much sugar/refined carbohydrates.  I have advised the patient to work at a goal of eliminating sugary drinks, candy, desserts, sweets, refined sugars, processed foods, and white carbohydrates.  The patient expresses  understanding.    2. Chronic pansinusitis Allergy season starting - montelukast (SINGULAIR) 10 MG tablet; Take 1 tablet (10 mg total) by mouth daily.  Dispense: 90 tablet; Refill: 1 - triamcinolone (NASACORT AQ) 55 MCG/ACT AERO nasal inhaler; Place 2 sprays into the nose daily.  Dispense: 1 each; Refill: 12  3. Essential hypertension Uncontrolled/off meds X1 month - amLODipine (NORVASC) 10 MG tablet; Take 1 tablet (10 mg total) by mouth daily.  Dispense: 90 tablet; Refill: 1 - hydrochlorothiazide (HYDRODIURIL) 25 MG tablet; Take 1 tablet (25 mg total) by mouth daily.  Dispense: 90 tablet; Refill: 3 - valsartan (DIOVAN) 40 MG tablet; Take 1 tablet (40 mg total) by mouth daily. For blood pressure  Dispense: 90 tablet; Refill: 3 - metoprolol tartrate (LOPRESSOR) 50 MG tablet; Take 1 tablet (50 mg total) by mouth 2 (two) times daily.  Dispense: 180 tablet; Refill: 1 - Comprehensive metabolic panel  4. Chest pain, exertional Not active - aspirin (ASPIRIN LOW DOSE) 81 MG EC tablet; Take 1 tablet (81 mg total) by mouth daily. Swallow whole.  Dispense: 90 tablet; Refill: 1  5. Hyperlipidemia associated with type 2 diabetes mellitus (HCC) - Lipid panel - CBC with Differential/Platelet - atorvastatin (LIPITOR) 40 MG tablet; Take 1 tablet (40 mg total) by mouth daily.  Dispense: 90 tablet; Refill: 3  6. MDD (major depressive disorder), recurrent episode, moderate (HCC) Stable on meds - DULoxetine (CYMBALTA) 60 MG capsule;  Take 1 capsule (60 mg total) by mouth daily.  Dispense: 90 capsule; Refill: 1  7. PTSD (post-traumatic stress disorder) Stable on meds - DULoxetine (CYMBALTA) 60 MG capsule; Take 1 capsule (60 mg total) by mouth daily.  Dispense: 90 capsule; Refill: 1 - hydrOXYzine (VISTARIL) 25 MG capsule; Take 1 capsule (25 mg total) by mouth at bedtime as needed for anxiety.  Dispense: 30 capsule; Refill: 2  8. Lumbar radiculopathy No red flags - methocarbamol (ROBAXIN) 500 MG tablet; Take 1 tablet (500 mg total) by mouth every 6 (six) hours as needed for muscle spasms.  Dispense: 90 tablet; Refill: 0 - predniSONE (DELTASONE) 10 MG tablet; Take 1 tablet (10 mg total) by mouth daily with breakfast. 6,5,4,3,2,1 take each days dose with food in the morning  Dispense: 21 tablet; Refill: 0 - Thyroid Panel With TSH  9. Vitamin D insufficiency Check vitamin D  10. Cervical radiculopathy due to intervertebral disc disorder - methocarbamol (ROBAXIN) 500 MG tablet; Take 1 tablet (500 mg total) by mouth every 6 (six) hours as needed for muscle spasms.  Dispense: 90 tablet; Refill: 0 - predniSONE (DELTASONE) 10 MG tablet; Take 1 tablet (10 mg total) by mouth daily with breakfast. 6,5,4,3,2,1 take each days dose with food in the morning  Dispense: 21 tablet; Refill: 0 - gabapentin (NEURONTIN) 300 MG capsule; Take 1 capsule (300 mg total) by mouth at bedtime. For nerve pain  Dispense: 90 capsule; Refill: 1 - Ambulatory referral to Neurology - Thyroid Panel With TSH  11. Ulnar neuropathy of left upper extremity - methocarbamol (ROBAXIN) 500 MG tablet; Take 1 tablet (500 mg total) by mouth every 6 (six) hours as needed for muscle spasms.  Dispense: 90 tablet; Refill: 0 - predniSONE (DELTASONE) 10 MG tablet; Take 1 tablet (10 mg total) by mouth daily with breakfast. 6,5,4,3,2,1 take each days dose with food in the morning  Dispense: 21 tablet; Refill: 0 - gabapentin (NEURONTIN) 300 MG capsule; Take 1 capsule (300 mg  total) by mouth at bedtime. For nerve pain  Dispense: 90 capsule;  Refill: 1 - Ambulatory referral to Neurology - Thyroid Panel With TSH    Patient have been counseled extensively about nutrition and exercise. Other issues discussed during this visit include: low cholesterol diet, weight control and daily exercise, foot care, annual eye examinations at Ophthalmology, importance of adherence with medications and regular follow-up. We also discussed long term complications of uncontrolled diabetes and hypertension.   Return for 3 weeks with luke for htn;  4 to 6 months with PCP.  The patient was given clear instructions to go to ER or return to medical center if symptoms don't improve, worsen or new problems develop. The patient verbalized understanding. The patient was told to call to get lab results if they haven't heard anything in the next week.      Freeman Caldron, PA-C [facility] [place], [place]   12/05/2021, 10:25 AM

## 2021-12-05 NOTE — Progress Notes (Signed)
Pain in right hip and left arm. Has not had any medication in 1 month Needs medication refills.

## 2021-12-05 NOTE — Patient Instructions (Signed)
Cubital Tunnel Syndrome Cubital tunnel syndrome is a condition that causes pain and weakness of the forearm and hand. It happens when one of the nerves that runs along the inside of the elbow joint (ulnar nerve) becomes irritated. This condition is usually caused by repeated arm motions that are done during sports or work-related activities. What are the causes? This condition may be caused by: Increased pressure on the ulnar nerve at the elbow, arm, or forearm. This can result from: Irritation caused by repeated elbow bending. Poorly healed elbow fractures. Tumors in the elbow. These are usually noncancerous (benign). Scar tissue that develops in the elbow after an injury. Bony growths (spurs) near the ulnar nerve. Stretching of the nerve due to loose elbow ligaments. Trauma to the nerve at the elbow. What increases the risk? The following factors may make you more likely to develop this condition: Doing manual labor that requires frequent bending of the elbow. Playing sports that include repeated or strenuous throwing motions, such as baseball. Playing contact sports, such as football or lacrosse. Not warming up properly before activities. Having diabetes. Having an underactive thyroid (hypothyroidism). What are the signs or symptoms? Symptoms of this condition include: Clumsiness or weakness of the hand. Tenderness of the inner elbow. Aching or soreness of the inner elbow, forearm, or fingers, especially the little finger or the ring finger. Increased pain when forcing the elbow to bend. Reduced control when throwing objects. Tingling, numbness, or a burning feeling inside the forearm or in part of the hand or fingers, especially the little finger or the ring finger. Sharp pains that shoot from the elbow down to the wrist and hand. The inability to grip or pinch hard. How is this diagnosed? This condition is diagnosed based on: Your symptoms and medical history. Your health care  provider will also ask for details about any injury. A physical exam. You may also have tests, including: Electromyogram (EMG). This test measures electrical signals sent by your nerves into the muscles. Nerve conduction study. This test measures how well electrical signals pass through your nerves. Imaging tests, such as X-rays, ultrasound, and MRI. These tests check for possible causes of your condition. How is this treated? This condition may be treated by: Stopping the activities that are causing your symptoms to get worse. Icing and taking medicines to reduce pain and swelling. Wearing a splint to prevent your elbow from bending, or wearing an elbow pad where the ulnar nerve is closest to the skin. Working with a physical therapist in less severe cases. This may help to: Decrease your symptoms. Improve the strength and range of motion of your elbow, forearm, and hand. If these treatments do not help, surgery may be needed. Follow these instructions at home: If you have a splint: Wear the splint as told by your health care provider. Remove it only as told by your health care provider. Loosen the splint if your fingers tingle, become numb, or turn cold and blue. Keep the splint clean. If the splint is not waterproof: Do not let it get wet. Cover it with a watertight covering when you take a bath or shower. Managing pain, stiffness, and swelling  If directed, put ice on the injured area: Put ice in a plastic bag. Place a towel between your skin and the bag. Leave the ice on for 20 minutes, 2-3 times a day. Move your fingers often to avoid stiffness and to lessen swelling. Raise (elevate) the injured area above the level of your heart  while you are sitting or lying down. General instructions Take over-the-counter and prescription medicines only as told by your health care provider. Do any exercise or physical therapy as told by your health care provider. Do not drive or use heavy  machinery while taking prescription pain medicine. If you were given an elbow pad, wear it as told by your health care provider. Keep all follow-up visits as told by your health care provider. This is important. Contact a health care provider if: Your symptoms get worse. Your symptoms do not get better with treatment. You have new pain. Your hand on the injured side feels numb or cold. Summary Cubital tunnel syndrome is a condition that causes pain and weakness of the forearm and hand. You are more likely to develop this condition if you do work or play sports that involve repeated arm movements. This condition is often treated by stopping repetitive activities, applying ice, and using anti-inflammatory medicines. In rare cases, surgery may be needed. This information is not intended to replace advice given to you by your health care provider. Make sure you discuss any questions you have with your health care provider. Document Revised: 11/22/2020 Document Reviewed: 11/22/2020 Elsevier Patient Education  [employer].

## 2021-12-06 LAB — LIPID PANEL
Chol/HDL Ratio: 3.9 ratio (ref 0.0–4.4)
Cholesterol, Total: 162 mg/dL (ref 100–199)
HDL: 42 mg/dL (ref >39)
LDL Chol Calc (NIH): 100 mg/dL — ABNORMAL HIGH (ref 0–99)
Triglycerides: 108 mg/dL (ref 0–149)
VLDL Cholesterol Cal: 20 mg/dL (ref 5–40)

## 2021-12-06 LAB — COMPREHENSIVE METABOLIC PANEL
ALT: 17 IU/L (ref 0–32)
AST: 16 IU/L (ref 0–40)
Albumin/Globulin Ratio: 1.3 (ref 1.2–2.2)
Albumin: 4.1 g/dL (ref 3.8–4.9)
Alkaline Phosphatase: 133 IU/L — ABNORMAL HIGH (ref 44–121)
BUN/Creatinine Ratio: 15 (ref 9–23)
BUN: 13 mg/dL (ref 6–24)
Bilirubin Total: 0.4 mg/dL (ref 0.0–1.2)
CO2: 23 mmol/L (ref 20–29)
Calcium: 9.9 mg/dL (ref 8.7–10.2)
Chloride: 105 mmol/L (ref 96–106)
Creatinine, Ser: 0.84 mg/dL (ref 0.57–1.00)
Globulin, Total: 3.2 g/dL (ref 1.5–4.5)
Glucose: 85 mg/dL (ref 70–99)
Potassium: 4.2 mmol/L (ref 3.5–5.2)
Sodium: 141 mmol/L (ref 134–144)
Total Protein: 7.3 g/dL (ref 6.0–8.5)
eGFR: 80 mL/min/{1.73_m2} (ref >59)

## 2021-12-06 LAB — CBC WITH DIFFERENTIAL/PLATELET
Basophils Absolute: 0 10*3/uL (ref 0.0–0.2)
Basos: 0 %
EOS (ABSOLUTE): 0.1 10*3/uL (ref 0.0–0.4)
Eos: 2 %
Hematocrit: 37.4 % (ref 34.0–46.6)
Hemoglobin: 12.1 g/dL (ref 11.1–15.9)
Immature Grans (Abs): 0 10*3/uL (ref 0.0–0.1)
Immature Granulocytes: 0 %
Lymphocytes Absolute: 2.1 10*3/uL (ref 0.7–3.1)
Lymphs: 35 %
MCH: 24.2 pg — ABNORMAL LOW (ref 26.6–33.0)
MCHC: 32.4 g/dL (ref 31.5–35.7)
MCV: 75 fL — ABNORMAL LOW (ref 79–97)
Monocytes Absolute: 0.5 10*3/uL (ref 0.1–0.9)
Monocytes: 8 %
Neutrophils Absolute: 3.2 10*3/uL (ref 1.4–7.0)
Neutrophils: 55 %
Platelets: 319 10*3/uL (ref 150–450)
RBC: 4.99 x10E6/uL (ref 3.77–5.28)
RDW: 13.9 % (ref 11.7–15.4)
WBC: 6 10*3/uL (ref 3.4–10.8)

## 2021-12-06 LAB — THYROID PANEL WITH TSH
Free Thyroxine Index: 1.6 (ref 1.2–4.9)
T3 Uptake Ratio: 24 % (ref 24–39)
T4, Total: 6.7 ug/dL (ref 4.5–12.0)
TSH: 1.56 u[IU]/mL (ref 0.450–4.500)

## 2021-12-18 ENCOUNTER — Telehealth: Payer: Self-pay | Admitting: Diagnostic Neuroimaging

## 2021-12-18 NOTE — Telephone Encounter (Signed)
Dr. Leta Baptist will be out of the office this week in April, I tried to call the patient to reschedule but she does not have a voice mail set up. Sent [employer]. ?

## 2021-12-27 ENCOUNTER — Other Ambulatory Visit: Payer: Self-pay | Admitting: Physician Assistant

## 2021-12-27 NOTE — Telephone Encounter (Signed)
Requested Prescriptions  ?Pending Prescriptions Disp Refills  ?? hydrOXYzine (VISTARIL) 25 MG capsule [Pharmacy Med Name: HYDROXYZINE PAM 25 MG CAP] 90 capsule 1  ?  Sig: TAKE 1 CAPSULE (25 MG TOTAL) BY MOUTH AT BEDTIME AS NEEDED FOR ANXIETY.  ?  ? Ear, Nose, and Throat:  Antihistamines 2 Passed - 12/27/2021 12:34 PM  ?  ?  Passed - Cr in normal range and within 360 days  ?  Creat  ?Date Value Ref Range Status  ?04/25/2015 0.85 0.50 - 1.10 mg/dL Final  ? ?Creatinine, Ser  ?Date Value Ref Range Status  ?12/05/2021 0.84 0.57 - 1.00 mg/dL Final  ?   ?  ?  Passed - Valid encounter within last 12 months  ?  Recent Outpatient Visits   ?      ? 3 weeks ago Type 2 diabetes mellitus with morbid obesity ([place])  ? [place] H[place]man, Dionne Bucy, [place]  ? 10 months ago Encounter for [employer] annual wellness exam  ? [place] Ladell Pier, MD  ? 12 months ago Essential hypertension  ? [place], RPH-CPP  ? 1 year ago Type 2 diabetes mellitus with morbid obesity ([place])  ? [place] Ladell Pier, MD  ? 1 year ago Need for influenza vaccination  ? [place], RPH-CPP  ?  ?  ?Future Appointments   ?        ? In 1 week Daisy Blossom, Jarome M[place], [place]  ? In 4 months Ladell Pier, MD [place]  ?  ? ?  ?  ?  ? ? ?

## 2022-01-03 ENCOUNTER — Ambulatory Visit: Payer: Medicare Other | Attending: Internal Medicine | Admitting: Pharmacist

## 2022-01-03 ENCOUNTER — Other Ambulatory Visit: Payer: Self-pay

## 2022-01-03 VITALS — BP 125/83 | HR 87

## 2022-01-03 DIAGNOSIS — I10 Essential (primary) hypertension: Secondary | ICD-10-CM | POA: Diagnosis not present

## 2022-01-03 NOTE — Progress Notes (Signed)
? ?  S:    ? ?No chief complaint on file. ? ?Michele Wood is a 61 y.o. female who presents for hypertension evaluation, education, and management. PMH is significant for HTN, CAD, T2DM, HLD, obesity, chronic back/neck pain, MDD. Patient was referred and last seen by Freeman Caldron, on 12/05/2021. BP was elevated at that visit, however, patient endorsed being without medication for several months prior to that appointment. BP medications were restarted.  ? ?Today, she arrives in good spirits and presents without assistance. Denies dizziness, headache, blurred vision, swelling. Patient reports hypertension is longstanding. Denies any issues since restarting her BP medications.   ? ?Family/Social history:  ?-FHX: no pertinent positives  ?-Tobacco: never smoker  ?-Alcohol: none reported  ? ?Medication adherence reported. Patient has taken BP medications today.  ? ?Current antihypertensives include:  amlodipine 10 mg daily, HCTZ 25 mg daily, valsartan 40 mg daily, metoprolol tartrate 50 mg BID ? ?Reported home BP readings: none ? ?Patient reported dietary habits:  ?- Cooks most of her food, does not add salt when cooking or adding salt at the table ?-Denies  drinking excessive caffeine daily  ? ?Patient-reported exercise habits:  ?-Goes to the gym daily, also swims  ?- Exercises from 6:30-9:30 every morning  ? ?O:  ?Vitals:  ? 01/03/22 1001  ?BP: 125/83  ?Pulse: 87  ? ?Last 3 Office BP readings: ?BP Readings from Last 3 Encounters:  ?01/03/22 125/83  ?12/05/21 (!) 168/110  ?02/13/21 137/90  ? ? ?BMET ?   ?Component Value Date/Time  ? NA 141 12/05/2021 1025  ? K 4.2 12/05/2021 1025  ? CL 105 12/05/2021 1025  ? CO2 23 12/05/2021 1025  ? GLUCOSE 85 12/05/2021 1025  ? GLUCOSE 112 (H) 06/27/2019 0040  ? BUN 13 12/05/2021 1025  ? CREATININE 0.84 12/05/2021 1025  ? CREATININE 0.85 04/25/2015 1602  ? CALCIUM 9.9 12/05/2021 1025  ? GFRNONAA 75 08/18/2020 1154  ? GFRNONAA 79 04/25/2015 1602  ? GFRAA 86 08/18/2020 1154  ? GFRAA  >89 04/25/2015 1602  ? ? ?Renal function: ?CrCl cannot be calculated (Patient's most recent lab result is older than the maximum 21 days allowed.). ? ?Clinical ASCVD: No  ?The 10-year ASCVD risk score (Arnett DK, et al., 2019) is: 13.5% ?  Values used to calculate the score: ?    Age: 43 years ?    Sex: Female ?    Is Non-Hispanic African American: Yes ?    Diabetic: Yes ?    Tobacco smoker: No ?    Systolic Blood Pressure: 893 mmHg ?    Is BP treated: Yes ?    HDL Cholesterol: 42 mg/dL ?    Total Cholesterol: 162 mg/dL ? ? ?A/P: ?Hypertension longstanding currently at goal on current medications. BP goal < 130/80 mmHg. Medication adherence appears optimal.  ?-Continued current regimen.  ?-F/u labs ordered - none  ?-Counseled on lifestyle modifications for blood pressure control including reduced dietary sodium, increased exercise, adequate sleep. ?-Encouraged patient to check BP at home and bring log of readings to next visit. Counseled on proper use of home BP cuff.  ? ?Results reviewed and written information provided. Patient verbalized understanding of treatment plan. Total time in face-to-face counseling 20 minutes.  ? ?F/u clinic visit in July with PCP. ? ?Benard Halsted, PharmD, BCACP, CPP ?Clinical Pharmacist ?[place] ?[phone] ? ? ? ?

## 2022-01-25 ENCOUNTER — Ambulatory Visit: Payer: Medicare Other | Admitting: Diagnostic Neuroimaging

## 2022-01-31 ENCOUNTER — Telehealth: Payer: Self-pay | Admitting: Diagnostic Neuroimaging

## 2022-01-31 ENCOUNTER — Encounter: Payer: Self-pay | Admitting: Diagnostic Neuroimaging

## 2022-01-31 ENCOUNTER — Ambulatory Visit (INDEPENDENT_AMBULATORY_CARE_PROVIDER_SITE_OTHER): Payer: Medicare Other | Admitting: Diagnostic Neuroimaging

## 2022-01-31 VITALS — BP 136/95 | HR 80 | Ht 64.0 in | Wt 252.8 lb

## 2022-01-31 DIAGNOSIS — M5412 Radiculopathy, cervical region: Secondary | ICD-10-CM | POA: Diagnosis not present

## 2022-01-31 NOTE — Progress Notes (Signed)
? ?[facility] ? ?PATIENT: Michele Wood ?DOB: 10/27/1960 ? ?REFERRING CLINICIAN: Argentina Donovan, PA-C ?HISTORY FROM: patient  ?REASON FOR VISIT:  new consult ? ? ?HISTORICAL ? ?CHIEF COMPLAINT:  ?Chief Complaint  ?Patient presents with  ? Cervical radiculopathy  ?  Rm 7 New Pt  "PCP said I have a pinched nerve in neck; left axilla hurts, 2 little fingers feel like needles, middle of left hand throbs; occasional sharp pain in my neck"  ? ? ?HISTORY OF PRESENT ILLNESS:  ? ?61 year old female here for evaluation of left arm and hand numbness.  Symptoms started around 2014.  Has had intermittent chronic posterior neck pain, left arm pain, left elbow pain, numbness and tingling into the forearm and hand, digits 4 and 5.  She has had 3 MRIs since 2014.  Last MRI in 2019.  She has seen neurosurgery for her neck and low back in 2019.  Symptoms tend to come on and go away on their own.  Symptoms have been aggravated in last 2 to 3 months.  Patient went to PCP and was tried gabapentin, Robaxin and prednisone without relief. ? ? ? ?REVIEW OF SYSTEMS: Full 14 system review of systems performed and negative with exception of: As per HPI. ? ?ALLERGIES: ?Allergies  ?Allergen Reactions  ? Codeine Sulfate Rash  ? Milk-Related Compounds Diarrhea and Other (See Comments)  ?  Diarrhea and stomach upset  ? ? ?HOME MEDICATIONS: ?Outpatient Medications Prior to Visit  ?Medication Sig Dispense Refill  ? acetaminophen (TYLENOL 8 HOUR) 650 MG CR tablet Take 1 tablet (650 mg total) by mouth every 8 (eight) hours as needed for pain or fever. 30 tablet 0  ? amLODipine (NORVASC) 10 MG tablet Take 1 tablet (10 mg total) by mouth daily. 90 tablet 1  ? aspirin (ASPIRIN LOW DOSE) 81 MG EC tablet Take 1 tablet (81 mg total) by mouth daily. Swallow whole. 90 tablet 1  ? atorvastatin (LIPITOR) 40 MG tablet Take 1 tablet (40 mg total) by mouth daily. 90 tablet 3  ? Cholecalciferol (VITAMIN D3) 2000 UNITS TABS Take 2,000 Units  by mouth daily. (Patient taking differently: Take 2,000 Units by mouth 2 (two) times a week.) 30 tablet 11  ? DULoxetine (CYMBALTA) 60 MG capsule Take 1 capsule (60 mg total) by mouth daily. 90 capsule 1  ? gabapentin (NEURONTIN) 300 MG capsule Take 1 capsule (300 mg total) by mouth at bedtime. For nerve pain 90 capsule 1  ? glucose blood (CONTOUR NEXT TEST) test strip Use as instructed 100 each 12  ? hydrochlorothiazide (HYDRODIURIL) 25 MG tablet Take 1 tablet (25 mg total) by mouth daily. 90 tablet 3  ? hydrOXYzine (VISTARIL) 25 MG capsule TAKE 1 CAPSULE (25 MG TOTAL) BY MOUTH AT BEDTIME AS NEEDED FOR ANXIETY. 90 capsule 1  ? ibuprofen (ADVIL) 600 MG tablet Take 1 tablet (600 mg total) by mouth 4 (four) times daily. 20 tablet 0  ? Lurasidone HCl 60 MG TABS Take 1 tablet (60 mg total) by mouth daily with breakfast. 30 tablet 2  ? metFORMIN (GLUCOPHAGE) 500 MG tablet Take 1 tablet (500 mg total) by mouth 2 (two) times daily with a meal. TAKE 1 TABLET BY MOUTH EVERY DAY WITH BREAKFAST 180 tablet 1  ? methocarbamol (ROBAXIN) 500 MG tablet Take 1 tablet (500 mg total) by mouth every 6 (six) hours as needed for muscle spasms. 90 tablet 0  ? metoprolol tartrate (LOPRESSOR) 50 MG tablet Take 1 tablet (50 mg  total) by mouth 2 (two) times daily. 180 tablet 1  ? montelukast (SINGULAIR) 10 MG tablet Take 1 tablet (10 mg total) by mouth daily. 90 tablet 1  ? mupirocin ointment (BACTROBAN) 2 % APPLY NASALLY TWICE A DAY FOR 5 DAYS    ? predniSONE (DELTASONE) 10 MG tablet Take 1 tablet (10 mg total) by mouth daily with breakfast. 6,5,4,3,2,1 take each days dose with food in the morning 21 tablet 0  ? traMADol (ULTRAM) 50 MG tablet Take 1 tablet (50 mg total) by mouth every 8 (eight) hours as needed for moderate pain. 30 tablet 0  ? triamcinolone (NASACORT AQ) 55 MCG/ACT AERO nasal inhaler Place 2 sprays into the nose daily. 1 each 12  ? valsartan (DIOVAN) 40 MG tablet Take 1 tablet (40 mg total) by mouth daily. For blood pressure  90 tablet 3  ? ?No facility-administered medications prior to visit.  ? ? ?PAST MEDICAL HISTORY: ?Past Medical History:  ?Diagnosis Date  ? Cervical radiculopathy   ? Chronic back pain   ? DDD (degenerative disc disease), cervical   ? Depression   ? Diabetes mellitus without complication ([place])   ? Hypertension   ? at age 60  ? Obesity   ? Pancreatic cyst 05/31/2019  ? Incidental - discovered on CT scan  ? s/p minimally-invasive resection of left atrial myxoma 06/08/2019  ? Sinusitis   ? Sleep apnea   ? at age 69  ? ? ?PAST SURGICAL HISTORY: ?Past Surgical History:  ?Procedure Laterality Date  ? ABDOMINAL HYSTERECTOMY    ? BACK SURGERY  2020  ? lower back  ? BREAST BIOPSY Right 07/02/2006  ? [place] Core benign  ? EXPLORATION POST OPERATIVE OPEN HEART  2020  ? MINIMALLY INVASIVE EXCISION OF ATRIAL MYXOMA N/A 06/08/2019  ? Procedure: MINIMALLY INVASIVE RESECTION OF LEFT ATRIAL MYXOMA;  Surgeon: [name], MD;  Location: [place];  Service: Open Heart Surgery;  Laterality: N/A;  ? NASAL SINUS SURGERY    ? TEE WITHOUT CARDIOVERSION N/A 05/17/2019  ? Procedure: TRANSESOPHAGEAL ECHOCARDIOGRAM (TEE);  Surgeon: Acie Fredrickson Wonda Cheng, MD;  Location: [place];  Service: Cardiovascular;  Laterality: N/A;  ? TEE WITHOUT CARDIOVERSION N/A 06/08/2019  ? Procedure: TRANSESOPHAGEAL ECHOCARDIOGRAM (TEE);  Surgeon: [name], MD;  Location: [place];  Service: Open Heart Surgery;  Laterality: N/A;  ? ? ?FAMILY HISTORY: ?Family History  ?Problem Relation Age of Onset  ? Hypertension Mother   ? Schizophrenia Brother   ? Hypertension Other   ? ? ?SOCIAL HISTORY: ?Social History  ? ?Socioeconomic History  ? Marital status: Married  ?  Spouse name: Jenny Reichmann  ? Number of children: 2  ? Years of education: Not on file  ? Highest education level: Some college, no degree  ?Occupational History  ?  Comment: home maker  ?Tobacco Use  ? Smoking status: Never  ? Smokeless tobacco: Never  ?Vaping Use  ? Vaping Use: Never used  ?Substance and Sexual  Activity  ? Alcohol use: Yes  ?  Alcohol/week: 0.0 standard drinks  ?  Comment: rare  ? Drug use: No  ? Sexual activity: Not Currently  ?Other Topics Concern  ? Not on file  ?Social History Narrative  ? Lives with husband  ? ?Social Determinants of Health  ? ?Financial Resource Strain: Not on file  ?Food Insecurity: Not on file  ?Transportation Needs: Not on file  ?Physical Activity: Not on file  ?Stress: Not on file  ?Social Connections: Not on  file  ?Intimate Partner Violence: Not on file  ? ? ? ?PHYSICAL EXAM ? ?GENERAL EXAM/CONSTITUTIONAL: ?Vitals:  ?Vitals:  ? 01/31/22 1308  ?BP: (!) 136/95  ?Pulse: 80  ?Weight: 252 lb 12.8 oz (114.7 kg)  ?Height: '5\' 4"'$  (1.626 m)  ? ?Body mass index is 43.39 kg/m?. ?Wt Readings from Last 3 Encounters:  ?01/31/22 252 lb 12.8 oz (114.7 kg)  ?12/05/21 259 lb (117.5 kg)  ?02/13/21 259 lb 6.4 oz (117.7 kg)  ? ?Patient is in no distress; well developed, nourished and groomed; neck is supple ? ?CARDIOVASCULAR: ?Examination of carotid arteries is normal; no carotid bruits ?Regular rate and rhythm, no murmurs ?Examination of peripheral vascular system by observation and palpation is normal ? ?EYES: ?Ophthalmoscopic exam of optic discs and posterior segments is normal; no papilledema or hemorrhages ?No results found. ? ?MUSCULOSKELETAL: ?Gait, strength, tone, movements noted in Neurologic exam below ? ?NEUROLOGIC: ?MENTAL STATUS:  ? ?  02/13/2021  ?  2:47 PM  ?MMSE - Mini Mental State Exam  ?Orientation to time 5  ?Orientation to Place 5  ?Registration 3  ?Attention/ Calculation 5  ?Recall 3  ?Language- name 2 objects 2  ?Language- repeat 1  ?Language- follow 3 step command 3  ?Language- read & follow direction 1  ?Write a sentence 1  ?Copy design 0  ?Total score 29  ? ?awake, alert, oriented to person, place and time ?recent and remote memory intact ?normal attention and concentration ?language fluent, comprehension intact, naming intact ?fund of knowledge appropriate ? ?CRANIAL NERVE:   ?2nd - no papilledema on fundoscopic exam ?2nd, 3rd, 4th, 6th - pupils equal and reactive to light, visual fields full to confrontation, extraocular muscles intact, no nystagmus ?5th - facial sensati

## 2022-01-31 NOTE — Patient Instructions (Addendum)
?  CERVICAL RADICULOPATHY (LEFT C7, C8, T1) ?- intermittent, chronic since 2014 ?- recommend conservative mgmt with PT/OT evaluation ?- may consider pain mgmt referral if symptoms significantly increase ?

## 2022-01-31 NOTE — Telephone Encounter (Signed)
Referral for OT sent to [facility] [phone]. ?

## 2022-01-31 NOTE — Telephone Encounter (Signed)
Referral for PT sent to [facility] [phone]. ?

## 2022-02-13 ENCOUNTER — Ambulatory Visit: Payer: Medicare Other

## 2022-02-13 ENCOUNTER — Ambulatory Visit: Payer: Medicare Other | Attending: Diagnostic Neuroimaging

## 2022-02-13 DIAGNOSIS — R293 Abnormal posture: Secondary | ICD-10-CM | POA: Diagnosis not present

## 2022-02-13 DIAGNOSIS — M5412 Radiculopathy, cervical region: Secondary | ICD-10-CM | POA: Diagnosis not present

## 2022-02-13 DIAGNOSIS — M542 Cervicalgia: Secondary | ICD-10-CM | POA: Diagnosis not present

## 2022-02-13 NOTE — Therapy (Signed)
?OUTPATIENT PHYSICAL THERAPY CERVICAL EVALUATION ? ? ?Patient Name: Michele Wood ?MRN: [id number] ?DOB:01/17/1961, 61 y.o., female ?Today's Date: 02/13/2022 ? ? PT End of Session - 02/13/22 1541   ? ? Visit Number 1   ? Number of Visits 8   ? Date for PT Re-Evaluation 03/13/22   ? Authorization Type [facility]   ? PT Start Time 1400   ? PT Stop Time 6503   ? PT Time Calculation (min) 45 min   ? Activity Tolerance Patient tolerated treatment well   ? Behavior During Therapy [facility] for tasks assessed/performed   ? ?  ?  ? ?  ? ? ?Past Medical History:  ?Diagnosis Date  ? Cervical radiculopathy   ? Chronic back pain   ? DDD (degenerative disc disease), cervical   ? Depression   ? Diabetes mellitus without complication ([place])   ? Hypertension   ? at age 44  ? Obesity   ? Pancreatic cyst 05/31/2019  ? Incidental - discovered on CT scan  ? s/p minimally-invasive resection of left atrial myxoma 06/08/2019  ? Sinusitis   ? Sleep apnea   ? at age 66  ? ?Past Surgical History:  ?Procedure Laterality Date  ? ABDOMINAL HYSTERECTOMY    ? BACK SURGERY  2020  ? lower back  ? BREAST BIOPSY Right 07/02/2006  ? [place] Core benign  ? EXPLORATION POST OPERATIVE OPEN HEART  2020  ? MINIMALLY INVASIVE EXCISION OF ATRIAL MYXOMA N/A 06/08/2019  ? Procedure: MINIMALLY INVASIVE RESECTION OF LEFT ATRIAL MYXOMA;  Surgeon: [name], MD;  Location: [place];  Service: Open Heart Surgery;  Laterality: N/A;  ? NASAL SINUS SURGERY    ? TEE WITHOUT CARDIOVERSION N/A 05/17/2019  ? Procedure: TRANSESOPHAGEAL ECHOCARDIOGRAM (TEE);  Surgeon: Acie Fredrickson Wonda Cheng, MD;  Location: [place];  Service: Cardiovascular;  Laterality: N/A;  ? TEE WITHOUT CARDIOVERSION N/A 06/08/2019  ? Procedure: TRANSESOPHAGEAL ECHOCARDIOGRAM (TEE);  Surgeon: [name], MD;  Location: [place];  Service: Open Heart Surgery;  Laterality: N/A;  ? ?Patient Active Problem List  ? Diagnosis Date Noted  ? Hyperlipidemia associated with type 2 diabetes mellitus ([place]) 02/28/2020  ?  Hypotension 07/19/2019  ? CAD (coronary artery disease) 06/23/2019  ? S/P minimally-invasive resection of left atrial myxoma 06/08/2019  ? Pancreatic cyst 05/31/2019  ? Spondylolisthesis of lumbar region 03/31/2019  ? Atrial myxoma 03/19/2019  ? Dyslipidemia, goal LDL below 70 12/16/2018  ? Family history of heart disease 12/16/2018  ? Left bundle branch block 12/16/2018  ? History of chest pain 12/16/2018  ? MDD (major depressive disorder), recurrent episode, moderate ([place]) 10/19/2018  ? Non-insulin treated type 2 diabetes mellitus ([place]) 12/12/2017  ? Morbid obesity ([place]) 09/12/2017  ? Anxiety and depression 06/13/2017  ? Cervical radiculopathy due to intervertebral disc disorder 06/13/2017  ? Chronic low back pain without sciatica 09/18/2015  ? DJD (degenerative joint disease), lumbar 06/08/2015  ? Vitamin D insufficiency 04/25/2014  ? HTN (hypertension) 06/21/2008  ? ? ?PCP: Ladell Pier, MD ? ?REFERRING PROVIDER: Penni Bombard, MD ? ?REFERRING DIAG: M54.12 (ICD-10-CM) - Left cervical radiculopathy  ? ?THERAPY DIAG: Left cervical radiculopathy  ? ? ?ONSET DATE: 2014 ? ?SUBJECTIVE:                                                                                                                                                                                                        ? ?  SUBJECTIVE STATEMENT: ?On/off pain since 2014, has undergone injections with little benefit.  LUE symptoms include NT, weakness and stiffness of neck and arm ? ?PERTINENT HISTORY:  ?61 year old female here for evaluation of left arm and hand numbness.  Symptoms started around 2014.  Has had intermittent chronic posterior neck pain, left arm pain, left elbow pain, numbness and tingling into the forearm and hand, digits 4 and 5.  She has had 3 MRIs since 2014.  Last MRI in 2019.  She has seen neurosurgery for her neck and low back in 2019.  Symptoms tend to come on and go away on their own.  Symptoms have been aggravated in last  2 to 3 months.  Patient went to PCP and was tried gabapentin, Robaxin and prednisone without relief. ?  ? ?PAIN:  ?Are you having pain? Scapula and armpit on L, 6/10, worse with activity better with rest ? ?PRECAUTIONS: None ? ?WEIGHT BEARING RESTRICTIONS No ? ?FALLS:  ?Has patient fallen in last 6 months? No ? ?LIVING ENVIRONMENT: ?Lives with: lives with their family ?Lives in: House/apartment ? ? ?OCCUPATION: not working ? ?PLOF: Independent ? ?PATIENT GOALS To reduce and manage my symptoms ? ?OBJECTIVE:  ? ?DIAGNOSTIC FINDINGS:  ?None noted recently ? ?PATIENT SURVEYS:  ?FOTO 40(54 predicted) ? ? ?COGNITION: ?Overall cognitive status: Within functional limits for tasks assessed ? ? ?SENSATION: ?Light touch: Impaired 4/5 LUE digits ? ?POSTURE:  ?Rounded shoulders and forward head  ? ?PALPATION: ?TPs note   ? ?CERVICAL ROM:  ? ?Active ROM A/PROM (deg) ?02/13/2022  ?Flexion 75%  ?Extension 10%  ?Right lateral flexion 50%  ?Left lateral flexion 25%  ?Right rotation 75%  ?Left rotation 50%  ? (Blank rows = not tested) ? ?UE ROM: AROM WFL throughout ? ?Active ROM Right ?02/13/2022 Left ?02/13/2022  ?Shoulder flexion    ?Shoulder extension    ?Shoulder abduction    ?Shoulder adduction    ?Shoulder extension    ?Shoulder internal rotation    ?Shoulder external rotation    ?Elbow flexion    ?Elbow extension    ?Wrist flexion    ?Wrist extension    ?Wrist ulnar deviation    ?Wrist radial deviation    ?Wrist pronation    ?Wrist supination    ? (Blank rows = not tested) ? ?UE MMT: ? ?MMT Right ?02/13/2022 Left ?02/13/2022  ?Shoulder flexion 5 4  ?Shoulder extension 5 4  ?Shoulder abduction 5 4  ?Shoulder adduction    ?Shoulder extension    ?Shoulder internal rotation    ?Shoulder external rotation    ?Middle trapezius    ?Lower trapezius    ?Elbow flexion 5 5  ?Elbow extension 5 5  ?Wrist flexion    ?Wrist extension    ?Wrist ulnar deviation    ?Wrist radial deviation    ?Wrist pronation    ?Wrist supination    ?Grip strength 35 23  ?  (Blank rows = not tested) ? ?CERVICAL SPECIAL TESTS:  ?Spurling's test: Negative ? ? ?FUNCTIONAL TESTS:  ?Deferred based on diagnosis ? ?PATIENT SURVEYS:  ?FOTO 40 ? ?TODAY'S TREATMENT:  ?Eval ? ? ?PATIENT EDUCATION:  ?Education details: Discussed eval findings, rehab rationale and POC and patient is in agreement  ?Person educated: Patient ?Education method: Explanation and Demonstration ?Education comprehension: verbalized understanding and needs further education ? ? ?HOME EXERCISE PROGRAM: ?Access Code: [email or web address] ?URL: [email or web address] ?Date: 02/13/2022 ?Prepared by: Sharlynn Oliphant ? ?Exercises ?- Seated Shoulder Shrug  - 2 x daily - 7  x weekly - 2 sets - 10 reps ?- Seated Scapular Retraction  - 2 x daily - 7 x weekly - 2 sets - 10 reps ?- Seated Cervical Retraction  - 2 x daily - 7 x weekly - 2 sets - 10 reps ?- Supine Chin Tuck  - 2 x daily - 7 x weekly - 2 sets - 10 reps ? ?ASSESSMENT: ? ?CLINICAL IMPRESSION: ?Patient is a 61 y.o. female who was seen today for physical therapy evaluation and treatment for cervical and LUE pain/symptoms. Cervical mobility initially limited but improved with postural correction, unable to elicit LUE symptoms with cervical motions suggesting muscular and soft tissue component vs. disco/vertebrogenic component ? ? ?OBJECTIVE IMPAIRMENTS decreased knowledge of condition, decreased ROM, decreased strength, increased muscle spasms, impaired sensation, impaired UE functional use, postural dysfunction, and pain.  ? ?ACTIVITY LIMITATIONS cleaning, driving, and meal prep.  ? ?PERSONAL FACTORS Fitness, Past/current experiences, and Time since onset of injury/illness/exacerbation are also affecting patient's functional outcome.  ? ? ?REHAB POTENTIAL: Good ? ?CLINICAL DECISION MAKING: Evolving/moderate complexity ? ?EVALUATION COMPLEXITY: Moderate ? ? ?GOALS: ?Goals reviewed with patient? Yes ? ?SHORT TERM GOALS: Target date: 02/27/2022 ? ?Patient to demonstrate  independence in HEP  ?Baseline: [id number] ?Goal status: INITIAL ? ?2.  Increase grip strength to 29# indicating 50% improvement ?Baseline: 23# ?Goal status: INITIAL ? ?3.  Patient to demo proper posture when cued ?Ba

## 2022-02-19 ENCOUNTER — Ambulatory Visit: Payer: Medicare Other

## 2022-02-19 DIAGNOSIS — M5412 Radiculopathy, cervical region: Secondary | ICD-10-CM

## 2022-02-19 DIAGNOSIS — R293 Abnormal posture: Secondary | ICD-10-CM | POA: Diagnosis not present

## 2022-02-19 DIAGNOSIS — M542 Cervicalgia: Secondary | ICD-10-CM | POA: Diagnosis not present

## 2022-02-19 NOTE — Therapy (Signed)
?OUTPATIENT PHYSICAL THERAPY TREATMENT NOTE ? ? ?Patient Name: Michele Wood ?MRN: [id number] ?DOB:[birth year], 61 y.o., female ?Today's Date: 02/19/2022 ? ?PCP: Ladell Pier, MD ?REFERRING PROVIDER: Bess Harvest MD ? ?END OF SESSION:  ? PT End of Session - 02/19/22 1310   ? ? Visit Number 2   ? Number of Visits 8   ? Date for PT Re-Evaluation 03/13/22   ? Authorization Type THN   ? PT Start Time 1315   ? PT Stop Time 1355   ? PT Time Calculation (min) 40 min   ? Activity Tolerance Patient tolerated treatment well   ? Behavior During Therapy [facility] for tasks assessed/performed   ? ?  ?  ? ?  ? ? ?Past Medical History:  ?Diagnosis Date  ? Cervical radiculopathy   ? Chronic back pain   ? DDD (degenerative disc disease), cervical   ? Depression   ? Diabetes mellitus without complication ([place])   ? Hypertension   ? at age 23  ? Obesity   ? Pancreatic cyst 05/31/2019  ? Incidental - discovered on CT scan  ? s/p minimally-invasive resection of left atrial myxoma 06/08/2019  ? Sinusitis   ? Sleep apnea   ? at age 88  ? ?Past Surgical History:  ?Procedure Laterality Date  ? ABDOMINAL HYSTERECTOMY    ? BACK SURGERY  2020  ? lower back  ? BREAST BIOPSY Right 07/02/2006  ? [place] Core benign  ? EXPLORATION POST OPERATIVE OPEN HEART  2020  ? MINIMALLY INVASIVE EXCISION OF ATRIAL MYXOMA N/A 06/08/2019  ? Procedure: MINIMALLY INVASIVE RESECTION OF LEFT ATRIAL MYXOMA;  Surgeon: [name], MD;  Location: [place];  Service: Open Heart Surgery;  Laterality: N/A;  ? NASAL SINUS SURGERY    ? TEE WITHOUT CARDIOVERSION N/A 05/17/2019  ? Procedure: TRANSESOPHAGEAL ECHOCARDIOGRAM (TEE);  Surgeon: Acie Fredrickson Wonda Cheng, MD;  Location: [place];  Service: Cardiovascular;  Laterality: N/A;  ? TEE WITHOUT CARDIOVERSION N/A 06/08/2019  ? Procedure: TRANSESOPHAGEAL ECHOCARDIOGRAM (TEE);  Surgeon: [name], MD;  Location: [place];  Service: Open Heart Surgery;  Laterality: N/A;  ? ?Patient Active Problem List  ? Diagnosis Date Noted   ? Hyperlipidemia associated with type 2 diabetes mellitus ([place]) 02/28/2020  ? Hypotension 07/19/2019  ? CAD (coronary artery disease) 06/23/2019  ? S/P minimally-invasive resection of left atrial myxoma 06/08/2019  ? Pancreatic cyst 05/31/2019  ? Spondylolisthesis of lumbar region 03/31/2019  ? Atrial myxoma 03/19/2019  ? Dyslipidemia, goal LDL below 70 12/16/2018  ? Family history of heart disease 12/16/2018  ? Left bundle branch block 12/16/2018  ? History of chest pain 12/16/2018  ? MDD (major depressive disorder), recurrent episode, moderate ([place]) 10/19/2018  ? Non-insulin treated type 2 diabetes mellitus ([place]) 12/12/2017  ? Morbid obesity ([place]) 09/12/2017  ? Anxiety and depression 06/13/2017  ? Cervical radiculopathy due to intervertebral disc disorder 06/13/2017  ? Chronic low back pain without sciatica 09/18/2015  ? DJD (degenerative joint disease), lumbar 06/08/2015  ? Vitamin D insufficiency 04/25/2014  ? HTN (hypertension) 06/21/2008  ? ? ?REFERRING DIAG: M54.12 (ICD-10-CM) - Left cervical radiculopathy  ? ?THERAPY DIAG: Left cervical radiculopathy  ? ? ?PERTINENT HISTORY: 61 year old female here for evaluation of left arm and hand numbness.  Symptoms started around 2014.  Has had intermittent chronic posterior neck pain, left arm pain, left elbow pain, numbness and tingling into the forearm and hand, digits 4 and 5.  She has had 3 MRIs since 2014.  Last MRI in 2019.  She has seen neurosurgery for her neck and low back in 2019.  Symptoms tend to come on and go away on their own.  Symptoms have been aggravated in last 2 to 3 months.  Patient went to PCP and was tried gabapentin, Robaxin and prednisone without relief. ?  ? ?PRECAUTIONS: None ? ?SUBJECTIVE: Reports pain has been "tolerable", has been working out with sisters w/o incident ? ?PAIN:  ?Are you having pain? Yes, 2/10 in L trap worse  ? ? ?OBJECTIVE: (objective measures completed at initial evaluation unless otherwise dated) ? ? ?  ?OBJECTIVE:  ?   ?DIAGNOSTIC FINDINGS:  ?None noted recently ?  ?PATIENT SURVEYS:  ?FOTO 40(54 predicted) ?  ?  ?COGNITION: ?Overall cognitive status: Within functional limits for tasks assessed ?  ?  ?SENSATION: ?Light touch: Impaired 4/5 LUE digits ?  ?POSTURE:  ?Rounded shoulders and forward head  ?  ?PALPATION: ?TPs note         ?  ?CERVICAL ROM:  ?  ?Active ROM A/PROM (deg) ?02/13/2022  ?Flexion 75%  ?Extension 10%  ?Right lateral flexion 50%  ?Left lateral flexion 25%  ?Right rotation 75%  ?Left rotation 50%  ? (Blank rows = not tested) ?  ?UE ROM: AROM WFL throughout ?  ?Active ROM Right ?02/13/2022 Left ?02/13/2022  ?Shoulder flexion      ?Shoulder extension      ?Shoulder abduction      ?Shoulder adduction      ?Shoulder extension      ?Shoulder internal rotation      ?Shoulder external rotation      ?Elbow flexion      ?Elbow extension      ?Wrist flexion      ?Wrist extension      ?Wrist ulnar deviation      ?Wrist radial deviation      ?Wrist pronation      ?Wrist supination      ? (Blank rows = not tested) ?  ?UE MMT: ?  ?MMT Right ?02/13/2022 Left ?02/13/2022  ?Shoulder flexion 5 4  ?Shoulder extension 5 4  ?Shoulder abduction 5 4  ?Shoulder adduction      ?Shoulder extension      ?Shoulder internal rotation      ?Shoulder external rotation      ?Middle trapezius      ?Lower trapezius      ?Elbow flexion 5 5  ?Elbow extension 5 5  ?Wrist flexion      ?Wrist extension      ?Wrist ulnar deviation      ?Wrist radial deviation      ?Wrist pronation      ?Wrist supination      ?Grip strength 35 23  ? (Blank rows = not tested) ?  ?CERVICAL SPECIAL TESTS:  ?Spurling's test: Negative ?  ?  ?FUNCTIONAL TESTS:  ?Deferred based on diagnosis ?  ?PATIENT SURVEYS:  ?FOTO 40 ?  ?TODAY'S TREATMENT:  ?[facility] Adult PT Treatment:                                                DATE: 02/19/22 ?Therapeutic Exercise: ?UE 3/3 L1 ?Chin tucks seated 10x ?Shrugs seated 10x ?Retractions seated 10x ?DNF test 27s ?Supine horizontal abduction RTB 15x B ?Supine  horizontal abduction RTB 15x alternating ?L upper trap  stretch 30s x3 ?Prone L extension 15x ?Prone L flexion 15x ?Prone L scaption 15x ?Prone horizontal abduction 10/10 IR/ER ?Prone Ws 15x ?L scalene stertch 30s x3 targeting each head ? ?  ?  ?PATIENT EDUCATION:  ?Education details: Discussed eval findings, rehab rationale and POC and patient is in agreement  ?Person educated: Patient ?Education method: Explanation and Demonstration ?Education comprehension: verbalized understanding and needs further education ?  ?  ?HOME EXERCISE PROGRAM: ?Access Code: [email or web address] ?URL: [email or web address] ?Date: 02/19/2022 ?Prepared by: Sharlynn Oliphant ? ?Exercises ?- Seated Shoulder Shrug  - 2 x daily - 7 x weekly - 2 sets - 10 reps ?- Seated Scapular Retraction  - 2 x daily - 7 x weekly - 2 sets - 10 reps ?- Seated Cervical Retraction  - 2 x daily - 7 x weekly - 2 sets - 10 reps ?- Supine Chin Tuck  - 2 x daily - 7 x weekly - 2 sets - 10 reps ?- Seated Upper Trapezius Stretch  - 2 x daily - 7 x weekly - 1 sets - 3 reps - 30s hold ?  ?ASSESSMENT: ?  ?CLINICAL IMPRESSION: ?Symptom intensity less, pain continues to present a muscular vs. Vertebro/discogenic.  Today's focus was aerobic work, postural correction and posterior shoulder strengthening.  Cervical mobility impoved and less restricted with observation of movement today. Upper trap, levator and scalene stretches reproduced LUE symptoms. ?  ?  ?OBJECTIVE IMPAIRMENTS decreased knowledge of condition, decreased ROM, decreased strength, increased muscle spasms, impaired sensation, impaired UE functional use, postural dysfunction, and pain.  ?  ?ACTIVITY LIMITATIONS cleaning, driving, and meal prep.  ?  ?PERSONAL FACTORS Fitness, Past/current experiences, and Time since onset of injury/illness/exacerbation are also affecting patient's functional outcome.  ?  ?  ?REHAB POTENTIAL: Good ?  ?CLINICAL DECISION MAKING: Evolving/moderate complexity ?  ?EVALUATION  COMPLEXITY: Moderate ?  ?  ?GOALS: ?Goals reviewed with patient? Yes ?  ?SHORT TERM GOALS: Target date: 02/27/2022 ?  ?Patient to demonstrate independence in HEP  ?Baseline: [id number] ?Goal status: Ongoing ?  ?2.

## 2022-02-20 NOTE — Therapy (Signed)
?OUTPATIENT PHYSICAL THERAPY TREATMENT NOTE ? ? ?Patient Name: Michele Wood ?MRN: [id number] ?DOB:1961-06-19, 61 y.o., female ?Today's Date: 02/21/2022 ? ?PCP: Ladell Pier, MD ?REFERRING PROVIDER: Bess Harvest MD ? ?END OF SESSION:  ? PT End of Session - 02/21/22 1526   ? ? Visit Number 3   ? Number of Visits 8   ? Date for PT Re-Evaluation 03/13/22   ? Authorization Type THN   ? PT Start Time 1526   ? PT Stop Time 0867   ? PT Time Calculation (min) 39 min   ? Activity Tolerance Patient tolerated treatment well   ? Behavior During Therapy [facility] for tasks assessed/performed   ? ?  ?  ? ?  ? ? ? ?Past Medical History:  ?Diagnosis Date  ? Cervical radiculopathy   ? Chronic back pain   ? DDD (degenerative disc disease), cervical   ? Depression   ? Diabetes mellitus without complication ([place])   ? Hypertension   ? at age 6  ? Obesity   ? Pancreatic cyst 05/31/2019  ? Incidental - discovered on CT scan  ? s/p minimally-invasive resection of left atrial myxoma 06/08/2019  ? Sinusitis   ? Sleep apnea   ? at age 10  ? ?Past Surgical History:  ?Procedure Laterality Date  ? ABDOMINAL HYSTERECTOMY    ? BACK SURGERY  2020  ? lower back  ? BREAST BIOPSY Right 07/02/2006  ? [place] Core benign  ? EXPLORATION POST OPERATIVE OPEN HEART  2020  ? MINIMALLY INVASIVE EXCISION OF ATRIAL MYXOMA N/A 06/08/2019  ? Procedure: MINIMALLY INVASIVE RESECTION OF LEFT ATRIAL MYXOMA;  Surgeon: [name], MD;  Location: [place];  Service: Open Heart Surgery;  Laterality: N/A;  ? NASAL SINUS SURGERY    ? TEE WITHOUT CARDIOVERSION N/A 05/17/2019  ? Procedure: TRANSESOPHAGEAL ECHOCARDIOGRAM (TEE);  Surgeon: Acie Fredrickson Wonda Cheng, MD;  Location: [place];  Service: Cardiovascular;  Laterality: N/A;  ? TEE WITHOUT CARDIOVERSION N/A 06/08/2019  ? Procedure: TRANSESOPHAGEAL ECHOCARDIOGRAM (TEE);  Surgeon: [name], MD;  Location: [place];  Service: Open Heart Surgery;  Laterality: N/A;  ? ?Patient Active Problem List  ? Diagnosis Date  Noted  ? Hyperlipidemia associated with type 2 diabetes mellitus ([place]) 02/28/2020  ? Hypotension 07/19/2019  ? CAD (coronary artery disease) 06/23/2019  ? S/P minimally-invasive resection of left atrial myxoma 06/08/2019  ? Pancreatic cyst 05/31/2019  ? Spondylolisthesis of lumbar region 03/31/2019  ? Atrial myxoma 03/19/2019  ? Dyslipidemia, goal LDL below 70 12/16/2018  ? Family history of heart disease 12/16/2018  ? Left bundle branch block 12/16/2018  ? History of chest pain 12/16/2018  ? MDD (major depressive disorder), recurrent episode, moderate ([place]) 10/19/2018  ? Non-insulin treated type 2 diabetes mellitus ([place]) 12/12/2017  ? Morbid obesity ([place]) 09/12/2017  ? Anxiety and depression 06/13/2017  ? Cervical radiculopathy due to intervertebral disc disorder 06/13/2017  ? Chronic low back pain without sciatica 09/18/2015  ? DJD (degenerative joint disease), lumbar 06/08/2015  ? Vitamin D insufficiency 04/25/2014  ? HTN (hypertension) 06/21/2008  ? ? ?REFERRING DIAG: M54.12 (ICD-10-CM) - Left cervical radiculopathy  ? ?THERAPY DIAG: Left cervical radiculopathy  ? ? ?PERTINENT HISTORY: 61 year old female here for evaluation of left arm and hand numbness.  Symptoms started around 2014.  Has had intermittent chronic posterior neck pain, left arm pain, left elbow pain, numbness and tingling into the forearm and hand, digits 4 and 5.  She has had 3 MRIs since 2014.  Last MRI in 2019.  She has seen neurosurgery for her neck and low back in 2019.  Symptoms tend to come on and go away on their own.  Symptoms have been aggravated in last 2 to 3 months.  Patient went to PCP and was tried gabapentin, Robaxin and prednisone without relief. ?  ? ?PRECAUTIONS: None ? ?SUBJECTIVE: Patient reports she has been feeling better overall. States she works out with her sisters at [employer] 5x a week.  ? ?PAIN:  ?Are you having pain? Yes, 2/10 in L trap worse  ? ? ?OBJECTIVE: (objective measures completed at initial evaluation unless  otherwise dated) ? ? ?  ?OBJECTIVE:  ?  ?DIAGNOSTIC FINDINGS:  ?None noted recently ?  ?PATIENT SURVEYS:  ?FOTO 40(54 predicted) ?  ?  ?COGNITION: ?Overall cognitive status: Within functional limits for tasks assessed ?  ?  ?SENSATION: ?Light touch: Impaired 4/5 LUE digits ?  ?POSTURE:  ?Rounded shoulders and forward head  ?  ?PALPATION: ?TPs note         ?  ?CERVICAL ROM:  ?  ?Active ROM A/PROM (deg) ?02/13/2022  ?Flexion 75%  ?Extension 10%  ?Right lateral flexion 50%  ?Left lateral flexion 25%  ?Right rotation 75%  ?Left rotation 50%  ? (Blank rows = not tested) ?  ?UE ROM: AROM WFL throughout ?  ?Active ROM Right ?02/13/2022 Left ?02/13/2022  ?Shoulder flexion      ?Shoulder extension      ?Shoulder abduction      ?Shoulder adduction      ?Shoulder extension      ?Shoulder internal rotation      ?Shoulder external rotation      ?Elbow flexion      ?Elbow extension      ?Wrist flexion      ?Wrist extension      ?Wrist ulnar deviation      ?Wrist radial deviation      ?Wrist pronation      ?Wrist supination      ? (Blank rows = not tested) ?  ?UE MMT: ?  ?MMT Right ?02/13/2022 Left ?02/13/2022  ?Shoulder flexion 5 4  ?Shoulder extension 5 4  ?Shoulder abduction 5 4  ?Shoulder adduction      ?Shoulder extension      ?Shoulder internal rotation      ?Shoulder external rotation      ?Middle trapezius      ?Lower trapezius      ?Elbow flexion 5 5  ?Elbow extension 5 5  ?Wrist flexion      ?Wrist extension      ?Wrist ulnar deviation      ?Wrist radial deviation      ?Wrist pronation      ?Wrist supination      ?Grip strength 35 23  ? (Blank rows = not tested) ?  ?CERVICAL SPECIAL TESTS:  ?Spurling's test: Negative ?  ?  ?FUNCTIONAL TESTS:  ?Deferred based on diagnosis ?  ?PATIENT SURVEYS:  ?FOTO 40 ?  ?TODAY'S TREATMENT:  ?[facility] Adult PT Treatment:                                                DATE: 02/21/2022 ?Therapeutic Exercise: ?UBE 3/3 L1 ?Chin tucks seated 10x ?Shoulder rolls forward/retro x10 each ?Rows RTB  2x10 ?Shoulder extension RTB 2x10 ?Seated horizontal abduction  RTB 2x10 ?Seated diagonals RTB 2x10 alternating ?Seated PNF D2 flexion 2x10 BIL (slight pain on Lt side) ?Upper trap stretch 2x30" BIL ?Prone L extension 15x ?Prone L flexion 15x ?Prone L scaption 15x ?Prone Ws 15x ?Levator scap stretch 2x30" BIL ?AROM neck flexion/ext/left/right x10 each ?Seated scaption 2# 2x10 ? ? ?[facility] Adult PT Treatment:                                                DATE: 02/19/22 ?Therapeutic Exercise: ?UE 3/3 L1 ?Chin tucks seated 10x ?Shrugs seated 10x ?Retractions seated 10x ?DNF test 27s ?Supine horizontal abduction RTB 15x B ?Supine horizontal abduction RTB 15x alternating ?L upper trap stretch 30s x3 ?Prone L extension 15x ?Prone L flexion 15x ?Prone L scaption 15x ?Prone horizontal abduction 10/10 IR/ER ?Prone Ws 15x ?L scalene stertch 30s x3 targeting each head ? ?  ?  ?PATIENT EDUCATION:  ?Education details: Discussed eval findings, rehab rationale and POC and patient is in agreement  ?Person educated: Patient ?Education method: Explanation and Demonstration ?Education comprehension: verbalized understanding and needs further education ?  ?  ?HOME EXERCISE PROGRAM: ?Access Code: [email or web address] ?URL: [email or web address] ?Date: 02/19/2022 ?Prepared by: Sharlynn Oliphant ? ?Exercises ?- Seated Shoulder Shrug  - 2 x daily - 7 x weekly - 2 sets - 10 reps ?- Seated Scapular Retraction  - 2 x daily - 7 x weekly - 2 sets - 10 reps ?- Seated Cervical Retraction  - 2 x daily - 7 x weekly - 2 sets - 10 reps ?- Supine Chin Tuck  - 2 x daily - 7 x weekly - 2 sets - 10 reps ?- Seated Upper Trapezius Stretch  - 2 x daily - 7 x weekly - 1 sets - 3 reps - 30s hold ?  ?ASSESSMENT: ?  ?CLINICAL IMPRESSION: ?Patient presents to PT with decreased neck/upper trap pain and reports HEP compliance. Session today focused on postural and periscapular strengthening to improve posture and functional independence. Patient was able to tolerate all  prescribed exercises with no adverse effects. Patient continues to benefit from skilled PT services and should be progressed as able to improve functional independence. ?  ?  ?OBJECTIVE IMPAIRMENTS decreased knowledg

## 2022-02-21 ENCOUNTER — Ambulatory Visit: Payer: Medicare Other

## 2022-02-21 DIAGNOSIS — M542 Cervicalgia: Secondary | ICD-10-CM

## 2022-02-21 DIAGNOSIS — M5412 Radiculopathy, cervical region: Secondary | ICD-10-CM

## 2022-02-21 DIAGNOSIS — R293 Abnormal posture: Secondary | ICD-10-CM

## 2022-02-26 ENCOUNTER — Ambulatory Visit: Payer: Medicare Other

## 2022-02-26 DIAGNOSIS — M5412 Radiculopathy, cervical region: Secondary | ICD-10-CM | POA: Diagnosis not present

## 2022-02-26 DIAGNOSIS — R293 Abnormal posture: Secondary | ICD-10-CM

## 2022-02-26 DIAGNOSIS — M542 Cervicalgia: Secondary | ICD-10-CM | POA: Diagnosis not present

## 2022-02-26 NOTE — Therapy (Signed)
?OUTPATIENT PHYSICAL THERAPY TREATMENT NOTE ? ? ?Patient Name: Michele Wood ?MRN: [id number] ?DOB:October 30, 1960, 61 y.o., female ?Today's Date: 02/26/2022 ? ?PCP: Ladell Pier, MD ?REFERRING PROVIDER: Bess Harvest MD ? ?END OF SESSION:  ? PT End of Session - 02/26/22 1224   ? ? Visit Number 4   ? Number of Visits 8   ? Date for PT Re-Evaluation 03/13/22   ? Authorization Type THN   ? PT Start Time 1225   ? PT Stop Time 1884   ? PT Time Calculation (min) 30 min   ? Activity Tolerance Patient tolerated treatment well   ? Behavior During Therapy [facility] for tasks assessed/performed   ? ?  ?  ? ?  ? ? ? ? ?Past Medical History:  ?Diagnosis Date  ? Cervical radiculopathy   ? Chronic back pain   ? DDD (degenerative disc disease), cervical   ? Depression   ? Diabetes mellitus without complication ([place])   ? Hypertension   ? at age 30  ? Obesity   ? Pancreatic cyst 05/31/2019  ? Incidental - discovered on CT scan  ? s/p minimally-invasive resection of left atrial myxoma 06/08/2019  ? Sinusitis   ? Sleep apnea   ? at age 87  ? ?Past Surgical History:  ?Procedure Laterality Date  ? ABDOMINAL HYSTERECTOMY    ? BACK SURGERY  2020  ? lower back  ? BREAST BIOPSY Right 07/02/2006  ? [place] Core benign  ? EXPLORATION POST OPERATIVE OPEN HEART  2020  ? MINIMALLY INVASIVE EXCISION OF ATRIAL MYXOMA N/A 06/08/2019  ? Procedure: MINIMALLY INVASIVE RESECTION OF LEFT ATRIAL MYXOMA;  Surgeon: [name], MD;  Location: [place];  Service: Open Heart Surgery;  Laterality: N/A;  ? NASAL SINUS SURGERY    ? TEE WITHOUT CARDIOVERSION N/A 05/17/2019  ? Procedure: TRANSESOPHAGEAL ECHOCARDIOGRAM (TEE);  Surgeon: Acie Fredrickson Wonda Cheng, MD;  Location: [place];  Service: Cardiovascular;  Laterality: N/A;  ? TEE WITHOUT CARDIOVERSION N/A 06/08/2019  ? Procedure: TRANSESOPHAGEAL ECHOCARDIOGRAM (TEE);  Surgeon: [name], MD;  Location: [place];  Service: Open Heart Surgery;  Laterality: N/A;  ? ?Patient Active Problem List  ? Diagnosis Date  Noted  ? Hyperlipidemia associated with type 2 diabetes mellitus ([place]) 02/28/2020  ? Hypotension 07/19/2019  ? CAD (coronary artery disease) 06/23/2019  ? S/P minimally-invasive resection of left atrial myxoma 06/08/2019  ? Pancreatic cyst 05/31/2019  ? Spondylolisthesis of lumbar region 03/31/2019  ? Atrial myxoma 03/19/2019  ? Dyslipidemia, goal LDL below 70 12/16/2018  ? Family history of heart disease 12/16/2018  ? Left bundle branch block 12/16/2018  ? History of chest pain 12/16/2018  ? MDD (major depressive disorder), recurrent episode, moderate ([place]) 10/19/2018  ? Non-insulin treated type 2 diabetes mellitus ([place]) 12/12/2017  ? Morbid obesity ([place]) 09/12/2017  ? Anxiety and depression 06/13/2017  ? Cervical radiculopathy due to intervertebral disc disorder 06/13/2017  ? Chronic low back pain without sciatica 09/18/2015  ? DJD (degenerative joint disease), lumbar 06/08/2015  ? Vitamin D insufficiency 04/25/2014  ? HTN (hypertension) 06/21/2008  ? ? ?REFERRING DIAG: M54.12 (ICD-10-CM) - Left cervical radiculopathy  ? ?THERAPY DIAG: Left cervical radiculopathy  ? ? ?PERTINENT HISTORY: 61 year old female here for evaluation of left arm and hand numbness.  Symptoms started around 2014.  Has had intermittent chronic posterior neck pain, left arm pain, left elbow pain, numbness and tingling into the forearm and hand, digits 4 and 5.  She has had 3 MRIs since  2014.  Last MRI in 2019.  She has seen neurosurgery for her neck and low back in 2019.  Symptoms tend to come on and go away on their own.  Symptoms have been aggravated in last 2 to 3 months.  Patient went to PCP and was tried gabapentin, Robaxin and prednisone without relief. ?  ? ?PRECAUTIONS: None ? ?SUBJECTIVE: Reports a slight increase in B upper trap pain as she has incresaed workload at gym ? ?PAIN:  ?Are you having pain? Yes, 2/10 in L trap worse  ? ? ?OBJECTIVE: (objective measures completed at initial evaluation unless otherwise dated) ? ? ?   ?OBJECTIVE:  ?  ?DIAGNOSTIC FINDINGS:  ?None noted recently ?  ?PATIENT SURVEYS:  ?FOTO 40(54 predicted) ?  ?  ?COGNITION: ?Overall cognitive status: Within functional limits for tasks assessed ?  ?  ?SENSATION: ?Light touch: Impaired 4/5 LUE digits ?  ?POSTURE:  ?Rounded shoulders and forward head  ?  ?PALPATION: ?TPs note         ?  ?CERVICAL ROM:  ?  ?Active ROM A/PROM (deg) ?02/13/2022  ?Flexion 75%  ?Extension 10%  ?Right lateral flexion 50%  ?Left lateral flexion 25%  ?Right rotation 75%  ?Left rotation 50%  ? (Blank rows = not tested) ?  ?UE ROM: AROM WFL throughout ?  ?Active ROM Right ?02/13/2022 Left ?02/13/2022  ?Shoulder flexion      ?Shoulder extension      ?Shoulder abduction      ?Shoulder adduction      ?Shoulder extension      ?Shoulder internal rotation      ?Shoulder external rotation      ?Elbow flexion      ?Elbow extension      ?Wrist flexion      ?Wrist extension      ?Wrist ulnar deviation      ?Wrist radial deviation      ?Wrist pronation      ?Wrist supination      ? (Blank rows = not tested) ?  ?UE MMT: ?  ?MMT Right ?02/13/2022 Left ?02/13/2022  ?Shoulder flexion 5 4  ?Shoulder extension 5 4  ?Shoulder abduction 5 4  ?Shoulder adduction      ?Shoulder extension      ?Shoulder internal rotation      ?Shoulder external rotation      ?Middle trapezius      ?Lower trapezius      ?Elbow flexion 5 5  ?Elbow extension 5 5  ?Wrist flexion      ?Wrist extension      ?Wrist ulnar deviation      ?Wrist radial deviation      ?Wrist pronation      ?Wrist supination      ?Grip strength 35 23  ? (Blank rows = not tested) ?  ?CERVICAL SPECIAL TESTS:  ?Spurling's test: Negative ?  ?  ?FUNCTIONAL TESTS:  ?Deferred based on diagnosis ?  ?PATIENT SURVEYS:  ?FOTO 40 ?  ?TODAY'S TREATMENT:  ?[facility] Adult PT Treatment:                                                DATE: 02/26/22 ?Therapeutic Exercise: ?UE 3/3 L1 ?Chin tucks seated 2x10 against wall/pillow ?Supine horizontal abduction RTB 15x B ?Supine horizontal abduction  RTB 15x alternating ?Supine alternating flexion RTB 10/10 ?  Prone L extension 15x 1# ?Prone L flexion 15x 1# ?Prone L scaption 15x 1# ?Prone horizontal abduction 10/10 IR/ER 1# ?Prone Ws 15x ?L scalene stertch 30s x3 targeting each head ?B cervical rotaion w/tuck 10x per side ?Manual Therapy: ?SO release followed my manual retractions ? ?[facility] Adult PT Treatment:                                                DATE: 02/21/2022 ?Therapeutic Exercise: ?UBE 3/3 L1 ?Chin tucks seated 10x ?Shoulder rolls forward/retro x10 each ?Rows RTB 2x10 ?Shoulder extension RTB 2x10 ?Seated horizontal abduction RTB 2x10 ?Seated diagonals RTB 2x10 alternating ?Seated PNF D2 flexion 2x10 BIL (slight pain on Lt side) ?Upper trap stretch 2x30" BIL ?Prone L extension 15x ?Prone L flexion 15x ?Prone L scaption 15x ?Prone Ws 15x ?Levator scap stretch 2x30" BIL ?AROM neck flexion/ext/left/right x10 each ?Seated scaption 2# 2x10 ? ? ?[facility] Adult PT Treatment:                                                DATE: 02/19/22 ?Therapeutic Exercise: ?UE 3/3 L1 ?Chin tucks seated 10x ?Shrugs seated 10x ?Retractions seated 10x ?DNF test 27s ?Supine horizontal abduction RTB 15x B ?Supine horizontal abduction RTB 15x alternating ?L upper trap stretch 30s x3 ?Prone L extension 15x ?Prone L flexion 15x ?Prone L scaption 15x ?Prone horizontal abduction 10/10 IR/ER ?Prone Ws 15x ?L scalene stertch 30s x3 targeting each head ? ?  ?  ?PATIENT EDUCATION:  ?Education details: Discussed eval findings, rehab rationale and POC and patient is in agreement  ?Person educated: Patient ?Education method: Explanation and Demonstration ?Education comprehension: verbalized understanding and needs further education ?  ?  ?HOME EXERCISE PROGRAM: ?Access Code: [email or web address] ?URL: [email or web address] ?Date: 02/19/2022 ?Prepared by: Sharlynn Oliphant ? ?Exercises ?- Seated Shoulder Shrug  - 2 x daily - 7 x weekly - 2 sets - 10 reps ?- Seated Scapular Retraction  - 2 x daily -  7 x weekly - 2 sets - 10 reps ?- Seated Cervical Retraction  - 2 x daily - 7 x weekly - 2 sets - 10 reps ?- Supine Chin Tuck  - 2 x daily - 7 x weekly - 2 sets - 10 reps ?- Seated Upper Trapezius Stretch  - 2

## 2022-02-28 ENCOUNTER — Ambulatory Visit: Payer: Medicare Other

## 2022-02-28 DIAGNOSIS — R293 Abnormal posture: Secondary | ICD-10-CM

## 2022-02-28 DIAGNOSIS — M5412 Radiculopathy, cervical region: Secondary | ICD-10-CM | POA: Diagnosis not present

## 2022-02-28 DIAGNOSIS — M542 Cervicalgia: Secondary | ICD-10-CM

## 2022-02-28 NOTE — Therapy (Signed)
OUTPATIENT PHYSICAL THERAPY TREATMENT NOTE   Patient Name: Michele Wood MRN: [id number] DOB:1961-09-06, 61 y.o., female Today's Date: 02/28/2022  PCP: Ladell Pier, MD REFERRING PROVIDER: Bess Harvest MD  END OF SESSION:   PT End of Session - 02/28/22 1315     Visit Number 5    Number of Visits 8    Date for PT Re-Evaluation 03/13/22    Authorization Type THN    PT Start Time 1315    PT Stop Time 1355    PT Time Calculation (min) 40 min    Activity Tolerance Patient tolerated treatment well    Behavior During Therapy WFL for tasks assessed/performed               Past Medical History:  Diagnosis Date   Cervical radiculopathy    Chronic back pain    DDD (degenerative disc disease), cervical    Depression    Diabetes mellitus without complication ([place])    Hypertension    at age 82   Obesity    Pancreatic cyst 05/31/2019   Incidental - discovered on CT scan   s/p minimally-invasive resection of left atrial myxoma 06/08/2019   Sinusitis    Sleep apnea    at age 32   Past Surgical History:  Procedure Laterality Date   ABDOMINAL HYSTERECTOMY     BACK SURGERY  2020   lower back   BREAST BIOPSY Right 07/02/2006   [place] Core benign   EXPLORATION POST OPERATIVE OPEN HEART  2020   MINIMALLY INVASIVE EXCISION OF ATRIAL MYXOMA N/A 06/08/2019   Procedure: MINIMALLY INVASIVE RESECTION OF LEFT ATRIAL MYXOMA;  Surgeon: [name], MD;  Location: [place];  Service: Open Heart Surgery;  Laterality: N/A;   NASAL SINUS SURGERY     TEE WITHOUT CARDIOVERSION N/A 05/17/2019   Procedure: TRANSESOPHAGEAL ECHOCARDIOGRAM (TEE);  Surgeon: Acie Fredrickson Wonda Cheng, MD;  Location: [place];  Service: Cardiovascular;  Laterality: N/A;   TEE WITHOUT CARDIOVERSION N/A 06/08/2019   Procedure: TRANSESOPHAGEAL ECHOCARDIOGRAM (TEE);  Surgeon: [name], MD;  Location: [place];  Service: Open Heart Surgery;  Laterality: N/A;   Patient Active Problem List   Diagnosis Date  Noted   Hyperlipidemia associated with type 2 diabetes mellitus ([place]) 02/28/2020   Hypotension 07/19/2019   CAD (coronary artery disease) 06/23/2019   S/P minimally-invasive resection of left atrial myxoma 06/08/2019   Pancreatic cyst 05/31/2019   Spondylolisthesis of lumbar region 03/31/2019   Atrial myxoma 03/19/2019   Dyslipidemia, goal LDL below 70 12/16/2018   Family history of heart disease 12/16/2018   Left bundle branch block 12/16/2018   History of chest pain 12/16/2018   MDD (major depressive disorder), recurrent episode, moderate ([place]) 10/19/2018   Non-insulin treated type 2 diabetes mellitus ([place]) 12/12/2017   Morbid obesity ([place]) 09/12/2017   Anxiety and depression 06/13/2017   Cervical radiculopathy due to intervertebral disc disorder 06/13/2017   Chronic low back pain without sciatica 09/18/2015   DJD (degenerative joint disease), lumbar 06/08/2015   Vitamin D insufficiency 04/25/2014   HTN (hypertension) 06/21/2008    REFERRING DIAG: M54.12 (ICD-10-CM) - Left cervical radiculopathy   THERAPY DIAG: Left cervical radiculopathy    PERTINENT HISTORY: 61 year old female here for evaluation of left arm and hand numbness.  Symptoms started around 2014.  Has had intermittent chronic posterior neck pain, left arm pain, left elbow pain, numbness and tingling into the forearm and hand, digits 4 and 5.  She has had 3 MRIs since  2014.  Last MRI in 2019.  She has seen neurosurgery for her neck and low back in 2019.  Symptoms tend to come on and go away on their own.  Symptoms have been aggravated in last 2 to 3 months.  Patient went to PCP and was tried gabapentin, Robaxin and prednisone without relief.    PRECAUTIONS: None  SUBJECTIVE: Has noted an increase in UE mobility/flexibility, LUE paresthesia less intense and frequent   PAIN:  Are you having pain? Yes, 2/10 in L trap worse    OBJECTIVE: (objective measures completed at initial evaluation unless otherwise dated)      OBJECTIVE:    DIAGNOSTIC FINDINGS:  None noted recently   PATIENT SURVEYS:  FOTO 40(54 predicted)     COGNITION: Overall cognitive status: Within functional limits for tasks assessed     SENSATION: Light touch: Impaired 4/5 LUE digits   POSTURE:  Rounded shoulders and forward head    PALPATION: TPs note           CERVICAL ROM:    Active ROM A/PROM (deg) 02/13/2022  Flexion 75%  Extension 10%  Right lateral flexion 50%  Left lateral flexion 25%  Right rotation 75%  Left rotation 50%   (Blank rows = not tested)   UE ROM: AROM WFL throughout   Active ROM Right 02/13/2022 Left 02/13/2022  Shoulder flexion      Shoulder extension      Shoulder abduction      Shoulder adduction      Shoulder extension      Shoulder internal rotation      Shoulder external rotation      Elbow flexion      Elbow extension      Wrist flexion      Wrist extension      Wrist ulnar deviation      Wrist radial deviation      Wrist pronation      Wrist supination       (Blank rows = not tested)   UE MMT:   MMT Right 02/13/2022 Left 02/13/2022  Shoulder flexion 5 4  Shoulder extension 5 4  Shoulder abduction 5 4  Shoulder adduction      Shoulder extension      Shoulder internal rotation      Shoulder external rotation      Middle trapezius      Lower trapezius      Elbow flexion 5 5  Elbow extension 5 5  Wrist flexion      Wrist extension      Wrist ulnar deviation      Wrist radial deviation      Wrist pronation      Wrist supination      Grip strength 35 23   (Blank rows = not tested)   CERVICAL SPECIAL TESTS:  Spurling's test: Negative     FUNCTIONAL TESTS:  Deferred based on diagnosis   PATIENT SURVEYS:  FOTO 40   TODAY'S TREATMENT:  [facility] Adult PT Treatment:                                                DATE: 02/28/22 Therapeutic Exercise: UE 3/3 L1.5 Chin tucks supine 3x10 over 1/2 roll Supine horizontal abduction RTB 15x B Supine horizontal abduction RTB  15x alternating Supine alternating flexion RTB 10/10 Prone  L extension 15x 2# Prone L flexion 15x 2# Prone L scaption 15x 2# Prone horizontal abduction 10/10 IR/ER 2# Prone Ws 15x Prone retraction/extension 15x L scalene stretch 30s x3 targeting each head Grip 35# B SO stretch over 1/2 roll 3 min Doorway/corner stertch, 30s x3  [facility] Adult PT Treatment:                                                DATE: 02/26/22 Therapeutic Exercise: UE 3/3 L1 Chin tucks seated 2x10 against wall/pillow Supine horizontal abduction RTB 15x B Supine horizontal abduction RTB 15x alternating Supine alternating flexion RTB 10/10 Prone L extension 15x 1# Prone L flexion 15x 1# Prone L scaption 15x 1# Prone horizontal abduction 10/10 IR/ER 1# Prone Ws 15x L scalene stertch 30s x3 targeting each head B cervical rotaion w/tuck 10x per side Manual Therapy: SO release followed my manual retractions  [facility] Adult PT Treatment:                                                DATE: 02/21/2022 Therapeutic Exercise: UBE 3/3 L1 Chin tucks seated 10x Shoulder rolls forward/retro x10 each Rows RTB 2x10 Shoulder extension RTB 2x10 Seated horizontal abduction RTB 2x10 Seated diagonals RTB 2x10 alternating Seated PNF D2 flexion 2x10 BIL (slight pain on Lt side) Upper trap stretch 2x30" BIL Prone L extension 15x Prone L flexion 15x Prone L scaption 15x Prone Ws 15x Levator scap stretch 2x30" BIL AROM neck flexion/ext/left/right x10 each Seated scaption 2# 2x10    [facility] Adult PT Treatment:                                                DATE: 02/19/22 Therapeutic Exercise: UE 3/3 L1 Chin tucks seated 10x Shrugs seated 10x Retractions seated 10x DNF test 27s Supine horizontal abduction RTB 15x B Supine horizontal abduction RTB 15x alternating L upper trap stretch 30s x3 Prone L extension 15x Prone L flexion 15x Prone L scaption 15x Prone horizontal abduction 10/10 IR/ER Prone Ws 15x L scalene stertch  30s x3 targeting each head      PATIENT EDUCATION:  Education details: Discussed eval findings, rehab rationale and POC and patient is in agreement  Person educated: Patient Education method: Explanation and Demonstration Education comprehension: verbalized understanding and needs further education     HOME EXERCISE PROGRAM: Access Code: [email or web address] URL: [email or web address] Date: 02/19/2022 Prepared by: Sharlynn Oliphant  Exercises - Seated Shoulder Shrug  - 2 x daily - 7 x weekly - 2 sets - 10 reps - Seated Scapular Retraction  - 2 x daily - 7 x weekly - 2 sets - 10 reps - Seated Cervical Retraction  - 2 x daily - 7 x weekly - 2 sets - 10 reps - Supine Chin Tuck  - 2 x daily - 7 x weekly - 2 sets - 10 reps - Seated Upper Trapezius Stretch  - 2 x daily - 7 x weekly - 1 sets - 3 reps - 30s hold   ASSESSMENT:   CLINICAL IMPRESSION: LUE paresthesias less frequent and intense, previous  elevated soreness has resolved to baseline.  Increased resistance and difficulty as noted.  Added prolonged SO stretch.  CROM post session finds 90% B rotation and extension     OBJECTIVE IMPAIRMENTS decreased knowledge of condition, decreased ROM, decreased strength, increased muscle spasms, impaired sensation, impaired UE functional use, postural dysfunction, and pain.    ACTIVITY LIMITATIONS cleaning, driving, and meal prep.    PERSONAL FACTORS Fitness, Past/current experiences, and Time since onset of injury/illness/exacerbation are also affecting patient's functional outcome.      REHAB POTENTIAL: Good   CLINICAL DECISION MAKING: Evolving/moderate complexity   EVALUATION COMPLEXITY: Moderate     GOALS: Goals reviewed with patient? Yes   SHORT TERM GOALS: Target date: 02/27/2022   Patient to demonstrate independence in HEP  Baseline: [id number] Goal status: Ongoing   2.  Increase grip strength to 29# indicating 50% improvement Baseline: 23#; 02/28/22 35# B Goal status: Met    3.  Patient to demo proper posture when cued Baseline: forward head and rounded shoulder posture Goal status: Met       LONG TERM GOALS: Target date: 03/27/2022   Increase FOTO score to 54 Baseline: 40 Goal status: INITIAL   2.  Increase all deficient CROM to 75% Baseline:  Active ROM A/PROM (deg) 02/13/2022  Flexion 75%  Extension 10%  Right lateral flexion 50%  Left lateral flexion 25%  Right rotation 75%  Left rotation 50%    Goal status: INITIAL   3.  Increase all deficient L shoulder strengths to 4+/5 Baseline:  MMT Right 02/13/2022 Left 02/13/2022  Shoulder flexion 5 4  Shoulder extension 5 4  Shoulder abduction 5 4    Goal status: INITIAL   4.  4/10 pain throughout neck and LUE Baseline: 6/10 Goal status: INITIAL     PLAN: PT FREQUENCY: 2x/week   PT DURATION: 4 weeks   PLANNED INTERVENTIONS: Therapeutic exercises, Therapeutic activity, Neuromuscular re-education, Balance training, Gait training, Patient/Family education, Joint mobilization, Dry Needling, and Manual therapy   PLAN FOR NEXT SESSION: HEP update, posterior shoulder strengthening, postural retraining, stretching of L neck musculature, f/u on soreness, continue to emphasize posture    Lanice Shirts, PT 02/28/2022, 1:15 PM

## 2022-03-05 ENCOUNTER — Ambulatory Visit: Payer: Medicare Other

## 2022-03-07 ENCOUNTER — Ambulatory Visit: Payer: Medicare Other

## 2022-03-07 DIAGNOSIS — R293 Abnormal posture: Secondary | ICD-10-CM

## 2022-03-07 DIAGNOSIS — M542 Cervicalgia: Secondary | ICD-10-CM | POA: Diagnosis not present

## 2022-03-07 DIAGNOSIS — M5412 Radiculopathy, cervical region: Secondary | ICD-10-CM | POA: Diagnosis not present

## 2022-03-07 NOTE — Therapy (Signed)
OUTPATIENT PHYSICAL THERAPY TREATMENT NOTE   Patient Name: Michele Wood MRN: [id number] DOB:[birth year], 61 y.o., female Today's Date: 03/07/2022  PCP: Ladell Pier, MD REFERRING PROVIDER: Bess Harvest MD  END OF SESSION:       Past Medical History:  Diagnosis Date   Cervical radiculopathy    Chronic back pain    DDD (degenerative disc disease), cervical    Depression    Diabetes mellitus without complication ([place])    Hypertension    at age 17   Obesity    Pancreatic cyst 05/31/2019   Incidental - discovered on CT scan   s/p minimally-invasive resection of left atrial myxoma 06/08/2019   Sinusitis    Sleep apnea    at age 20   Past Surgical History:  Procedure Laterality Date   ABDOMINAL HYSTERECTOMY     BACK SURGERY  2020   lower back   BREAST BIOPSY Right 07/02/2006   [place] Core benign   EXPLORATION POST OPERATIVE OPEN HEART  2020   MINIMALLY INVASIVE EXCISION OF ATRIAL MYXOMA N/A 06/08/2019   Procedure: MINIMALLY INVASIVE RESECTION OF LEFT ATRIAL MYXOMA;  Surgeon: [name], MD;  Location: [place];  Service: Open Heart Surgery;  Laterality: N/A;   NASAL SINUS SURGERY     TEE WITHOUT CARDIOVERSION N/A 05/17/2019   Procedure: TRANSESOPHAGEAL ECHOCARDIOGRAM (TEE);  Surgeon: Acie Fredrickson Wonda Cheng, MD;  Location: [place];  Service: Cardiovascular;  Laterality: N/A;   TEE WITHOUT CARDIOVERSION N/A 06/08/2019   Procedure: TRANSESOPHAGEAL ECHOCARDIOGRAM (TEE);  Surgeon: [name], MD;  Location: [place];  Service: Open Heart Surgery;  Laterality: N/A;   Patient Active Problem List   Diagnosis Date Noted   Hyperlipidemia associated with type 2 diabetes mellitus ([place]) 02/28/2020   Hypotension 07/19/2019   CAD (coronary artery disease) 06/23/2019   S/P minimally-invasive resection of left atrial myxoma 06/08/2019   Pancreatic cyst 05/31/2019   Spondylolisthesis of lumbar region 03/31/2019   Atrial myxoma 03/19/2019   Dyslipidemia, goal LDL below  70 12/16/2018   Family history of heart disease 12/16/2018   Left bundle branch block 12/16/2018   History of chest pain 12/16/2018   MDD (major depressive disorder), recurrent episode, moderate ([place]) 10/19/2018   Non-insulin treated type 2 diabetes mellitus ([place]) 12/12/2017   Morbid obesity ([place]) 09/12/2017   Anxiety and depression 06/13/2017   Cervical radiculopathy due to intervertebral disc disorder 06/13/2017   Chronic low back pain without sciatica 09/18/2015   DJD (degenerative joint disease), lumbar 06/08/2015   Vitamin D insufficiency 04/25/2014   HTN (hypertension) 06/21/2008    REFERRING DIAG: M54.12 (ICD-10-CM) - Left cervical radiculopathy   THERAPY DIAG: Left cervical radiculopathy    PERTINENT HISTORY: 61 year old female here for evaluation of left arm and hand numbness.  Symptoms started around 2014.  Has had intermittent chronic posterior neck pain, left arm pain, left elbow pain, numbness and tingling into the forearm and hand, digits 4 and 5.  She has had 3 MRIs since 2014.  Last MRI in 2019.  She has seen neurosurgery for her neck and low back in 2019.  Symptoms tend to come on and go away on their own.  Symptoms have been aggravated in last 2 to 3 months.  Patient went to PCP and was tried gabapentin, Robaxin and prednisone without relief.    PRECAUTIONS: None  SUBJECTIVE: Continues to report improvement, has been consistent in attending gym with her sisters and does not feel restricted from activities.  PAIN:  Are  you having pain? Yes, 2/10 in L trap worse    OBJECTIVE: (objective measures completed at initial evaluation unless otherwise dated)     OBJECTIVE:    DIAGNOSTIC FINDINGS:  None noted recently   PATIENT SURVEYS:  FOTO 40(54 predicted); 03/07/22 51     COGNITION: Overall cognitive status: Within functional limits for tasks assessed     SENSATION: Light touch: Impaired 4/5 LUE digits   POSTURE:  Rounded shoulders and forward head     PALPATION: TPs note           CERVICAL ROM:    Active ROM A/PROM (deg) 02/13/2022  Flexion 75%  Extension 10%  Right lateral flexion 50%  Left lateral flexion 25%  Right rotation 75%  Left rotation 50%   (Blank rows = not tested)   UE ROM: AROM WFL throughout   Active ROM Right 02/13/2022 Left 02/13/2022  Shoulder flexion      Shoulder extension      Shoulder abduction      Shoulder adduction      Shoulder extension      Shoulder internal rotation      Shoulder external rotation      Elbow flexion      Elbow extension      Wrist flexion      Wrist extension      Wrist ulnar deviation      Wrist radial deviation      Wrist pronation      Wrist supination       (Blank rows = not tested)   UE MMT:   MMT Right 02/13/2022 Left 02/13/2022  Shoulder flexion 5 4  Shoulder extension 5 4  Shoulder abduction 5 4  Shoulder adduction      Shoulder extension      Shoulder internal rotation      Shoulder external rotation      Middle trapezius      Lower trapezius      Elbow flexion 5 5  Elbow extension 5 5  Wrist flexion      Wrist extension      Wrist ulnar deviation      Wrist radial deviation      Wrist pronation      Wrist supination      Grip strength 35 23   (Blank rows = not tested)   CERVICAL SPECIAL TESTS:  Spurling's test: Negative     FUNCTIONAL TESTS:  Deferred based on diagnosis   PATIENT SURVEYS:  FOTO 40; 03/07/22 51   TODAY'S TREATMENT:  [facility] Adult PT Treatment:                                                DATE: 03/07/22 Therapeutic Exercise: UE 3/3 L 2 Chin tucks supine 3x10 over 1/2 roll Supine horizontal abduction RTB 15x B Supine horizontal abduction RTB 15x alternating Prone L extension 15x 2# Prone L flexion 15x 2# Prone L scaption 15x 2# Prone horizontal abduction 10/10 IR/ER 2# Prone Ws 15x Prone retraction/extension 15x Supine press 2# 15x Supine flexion 2# 15x SO stretch over 1/2 roll 3 min Doorway/corner stertch, 30s  x3    [facility] Adult PT Treatment:  DATE: 02/28/22 Therapeutic Exercise: UE 3/3 L1.5 Chin tucks supine 3x10 over 1/2 roll Supine horizontal abduction RTB 15x B Supine horizontal abduction RTB 15x alternating Supine alternating flexion RTB 10/10 Prone L extension 15x 2# Prone L flexion 15x 2# Prone L scaption 15x 2# Prone horizontal abduction 10/10 IR/ER 2# Prone Ws 15x Prone retraction/extension 15x L scalene stretch 30s x3 targeting each head Grip 35# B SO stretch over 1/2 roll 3 min Doorway/corner stertch, 30s x3  [facility] Adult PT Treatment:                                                DATE: 02/26/22 Therapeutic Exercise: UE 3/3 L1 Chin tucks seated 2x10 against wall/pillow Supine horizontal abduction RTB 15x B Supine horizontal abduction RTB 15x alternating Supine alternating flexion RTB 10/10 Prone L extension 15x 1# Prone L flexion 15x 1# Prone L scaption 15x 1# Prone horizontal abduction 10/10 IR/ER 1# Prone Ws 15x L scalene stertch 30s x3 targeting each head B cervical rotaion w/tuck 10x per side Manual Therapy: SO release followed my manual retractions       PATIENT EDUCATION:  Education details: Discussed eval findings, rehab rationale and POC and patient is in agreement  Person educated: Patient Education method: Explanation and Demonstration Education comprehension: verbalized understanding and needs further education     HOME EXERCISE PROGRAM: Access Code: [email or web address] URL: [email or web address] Date: 03/07/2022 Prepared by: Sharlynn Oliphant  Exercises - Seated Cervical Retraction  - 2 x daily - 7 x weekly - 2 sets - 10 reps - Supine Chin Tuck  - 2 x daily - 7 x weekly - 2 sets - 10 reps - Doorway Pec Stretch at 90 Degrees Abduction  - 2 x daily - 7 x weekly - 1 sets - 3 reps - 30s hold   ASSESSMENT:   CLINICAL IMPRESSION: FOTO score increased but goal not yet met.  Has regained functional CROM,  posture has improved but continued L side muscle cramping still noted, reproduced with L SB at times.  Despite good gains in mobility, patient rates herself at only 40%     OBJECTIVE IMPAIRMENTS decreased knowledge of condition, decreased ROM, decreased strength, increased muscle spasms, impaired sensation, impaired UE functional use, postural dysfunction, and pain.    ACTIVITY LIMITATIONS cleaning, driving, and meal prep.    PERSONAL FACTORS Fitness, Past/current experiences, and Time since onset of injury/illness/exacerbation are also affecting patient's functional outcome.      REHAB POTENTIAL: Good   CLINICAL DECISION MAKING: Evolving/moderate complexity   EVALUATION COMPLEXITY: Moderate     GOALS: Goals reviewed with patient? Yes   SHORT TERM GOALS: Target date: 02/27/2022   Patient to demonstrate independence in HEP  Baseline: [id number] Goal status: Met   2.  Increase grip strength to 29# indicating 50% improvement Baseline: 23#; 02/28/22 35# B Goal status: Met   3.  Patient to demo proper posture when cued Baseline: forward head and rounded shoulder posture Goal status: Met       LONG TERM GOALS: Target date: 03/27/2022   Increase FOTO score to 54 Baseline: 40; 03/07/22 51 Goal status: Ongoing   2.  Increase all deficient CROM to 75% Baseline:  Active ROM A/PROM (deg) 02/13/2022 AROM 02/28/22  Flexion 75%   Extension 10% 90%  Right lateral flexion 50% 90%  Left lateral flexion 25% 90%  Right  rotation 75% 90%  Left rotation 50% 90%    Goal status: Ongoing as consistency not established   3.  Increase all deficient L shoulder strengths to 4+/5 Baseline:  MMT Right 02/13/2022 Left 02/13/2022  Shoulder flexion 5 4  Shoulder extension 5 4  Shoulder abduction 5 4    Goal status: INITIAL   4.  4/10 pain throughout neck and LUE Baseline: 6/10 Goal status: INITIAL     PLAN: PT FREQUENCY: 2x/week   PT DURATION: 4 weeks   PLANNED INTERVENTIONS: Therapeutic  exercises, Therapeutic activity, Neuromuscular re-education, Balance training, Gait training, Patient/Family education, Joint mobilization, Dry Needling, and Manual therapy   PLAN FOR NEXT SESSION: HEP update, posterior shoulder strengthening, postural retraining, stretching of L neck musculature, f/u on soreness, continue to emphasize posture, address L SB pain    Lanice Shirts, PT 03/07/2022, 12:14 PM

## 2022-03-12 ENCOUNTER — Ambulatory Visit: Payer: Medicare Other

## 2022-03-12 DIAGNOSIS — M5412 Radiculopathy, cervical region: Secondary | ICD-10-CM

## 2022-03-12 DIAGNOSIS — R293 Abnormal posture: Secondary | ICD-10-CM | POA: Diagnosis not present

## 2022-03-12 DIAGNOSIS — M542 Cervicalgia: Secondary | ICD-10-CM

## 2022-03-12 NOTE — Therapy (Signed)
OUTPATIENT PHYSICAL THERAPY TREATMENT NOTE   Patient Name: Michele Wood MRN: [id number] DOB:02/21/1961, 61 y.o., female Today's Date: 03/12/2022  PCP: Ladell Pier, MD REFERRING PROVIDER: Bess Harvest MD  END OF SESSION:   PT End of Session - 03/12/22 1129     Visit Number 7    Number of Visits 8    Date for PT Re-Evaluation 03/13/22    Authorization Type THN    PT Start Time 50    PT Stop Time 1209    PT Time Calculation (min) 39 min    Activity Tolerance Patient tolerated treatment well    Behavior During Therapy [facility] for tasks assessed/performed                Past Medical History:  Diagnosis Date   Cervical radiculopathy    Chronic back pain    DDD (degenerative disc disease), cervical    Depression    Diabetes mellitus without complication ([place])    Hypertension    at age 25   Obesity    Pancreatic cyst 05/31/2019   Incidental - discovered on CT scan   s/p minimally-invasive resection of left atrial myxoma 06/08/2019   Sinusitis    Sleep apnea    at age 37   Past Surgical History:  Procedure Laterality Date   ABDOMINAL HYSTERECTOMY     BACK SURGERY  2020   lower back   BREAST BIOPSY Right 07/02/2006   [place] Core benign   EXPLORATION POST OPERATIVE OPEN HEART  2020   MINIMALLY INVASIVE EXCISION OF ATRIAL MYXOMA N/A 06/08/2019   Procedure: MINIMALLY INVASIVE RESECTION OF LEFT ATRIAL MYXOMA;  Surgeon: [name], MD;  Location: [place];  Service: Open Heart Surgery;  Laterality: N/A;   NASAL SINUS SURGERY     TEE WITHOUT CARDIOVERSION N/A 05/17/2019   Procedure: TRANSESOPHAGEAL ECHOCARDIOGRAM (TEE);  Surgeon: Acie Fredrickson Wonda Cheng, MD;  Location: [place];  Service: Cardiovascular;  Laterality: N/A;   TEE WITHOUT CARDIOVERSION N/A 06/08/2019   Procedure: TRANSESOPHAGEAL ECHOCARDIOGRAM (TEE);  Surgeon: [name], MD;  Location: [place];  Service: Open Heart Surgery;  Laterality: N/A;   Patient Active Problem List   Diagnosis Date  Noted   Hyperlipidemia associated with type 2 diabetes mellitus ([place]) 02/28/2020   Hypotension 07/19/2019   CAD (coronary artery disease) 06/23/2019   S/P minimally-invasive resection of left atrial myxoma 06/08/2019   Pancreatic cyst 05/31/2019   Spondylolisthesis of lumbar region 03/31/2019   Atrial myxoma 03/19/2019   Dyslipidemia, goal LDL below 70 12/16/2018   Family history of heart disease 12/16/2018   Left bundle branch block 12/16/2018   History of chest pain 12/16/2018   MDD (major depressive disorder), recurrent episode, moderate ([place]) 10/19/2018   Non-insulin treated type 2 diabetes mellitus ([place]) 12/12/2017   Morbid obesity ([place]) 09/12/2017   Anxiety and depression 06/13/2017   Cervical radiculopathy due to intervertebral disc disorder 06/13/2017   Chronic low back pain without sciatica 09/18/2015   DJD (degenerative joint disease), lumbar 06/08/2015   Vitamin D insufficiency 04/25/2014   HTN (hypertension) 06/21/2008    REFERRING DIAG: M54.12 (ICD-10-CM) - Left cervical radiculopathy   THERAPY DIAG: Left cervical radiculopathy    PERTINENT HISTORY: 61 year old female here for evaluation of left arm and hand numbness.  Symptoms started around 2014.  Has had intermittent chronic posterior neck pain, left arm pain, left elbow pain, numbness and tingling into the forearm and hand, digits 4 and 5.  She has had 3 MRIs  since 2014.  Last MRI in 2019.  She has seen neurosurgery for her neck and low back in 2019.  Symptoms tend to come on and go away on their own.  Symptoms have been aggravated in last 2 to 3 months.  Patient went to PCP and was tried gabapentin, Robaxin and prednisone without relief.    PRECAUTIONS: None  SUBJECTIVE: Patient presents to PT with continued reports of improvements. Today she reports the pain in more in her R upper trap.  PAIN:  Are you having pain? Yes, 5/10 in R trap worse    OBJECTIVE: (objective measures completed at initial evaluation  unless otherwise dated)     OBJECTIVE:    DIAGNOSTIC FINDINGS:  None noted recently   PATIENT SURVEYS:  FOTO 40(54 predicted); 03/07/22 51     COGNITION: Overall cognitive status: Within functional limits for tasks assessed     SENSATION: Light touch: Impaired 4/5 LUE digits   POSTURE:  Rounded shoulders and forward head    PALPATION: TPs note           CERVICAL ROM:    Active ROM A/PROM (deg) 02/13/2022  Flexion 75%  Extension 10%  Right lateral flexion 50%  Left lateral flexion 25%  Right rotation 75%  Left rotation 50%   (Blank rows = not tested)   UE ROM: AROM WFL throughout   Active ROM Right 02/13/2022 Left 02/13/2022  Shoulder flexion      Shoulder extension      Shoulder abduction      Shoulder adduction      Shoulder extension      Shoulder internal rotation      Shoulder external rotation      Elbow flexion      Elbow extension      Wrist flexion      Wrist extension      Wrist ulnar deviation      Wrist radial deviation      Wrist pronation      Wrist supination       (Blank rows = not tested)   UE MMT:   MMT Right 02/13/2022 Left 02/13/2022  Shoulder flexion 5 4  Shoulder extension 5 4  Shoulder abduction 5 4  Shoulder adduction      Shoulder extension      Shoulder internal rotation      Shoulder external rotation      Middle trapezius      Lower trapezius      Elbow flexion 5 5  Elbow extension 5 5  Wrist flexion      Wrist extension      Wrist ulnar deviation      Wrist radial deviation      Wrist pronation      Wrist supination      Grip strength 35 23   (Blank rows = not tested)   CERVICAL SPECIAL TESTS:  Spurling's test: Negative     FUNCTIONAL TESTS:  Deferred based on diagnosis   PATIENT SURVEYS:  FOTO 40; 03/07/22 51   TODAY'S TREATMENT:  [facility] Adult PT Treatment:                                                DATE: 03/12/2022 Therapeutic Exercise: UE 3/3 L2 Supine horizontal abduction GTB 2x10 Supine  horizontal abduction GTB x10 Prone IYW lift  x10 each Prone T lift 2# x10 Prone shoulder extension 2# 2x10 Supine press 2# 2x10 Supine flexion 2# 2x10 Doorway/corner stretch, 30s x3 Prone alternating UE/LE lift 2x10 BIL Upper trap stretch 2x30" BIL Levator scap stretch 2x30" BIL Seated scaption 2# 2x15 (to shoulder height)   [facility] Adult PT Treatment:                                                DATE: 03/07/22 Therapeutic Exercise: UE 3/3 L 2 Chin tucks supine 3x10 over 1/2 roll Supine horizontal abduction RTB 15x B Supine horizontal abduction RTB 15x alternating Prone L extension 15x 2# Prone L flexion 15x 2# Prone L scaption 15x 2# Prone horizontal abduction 10/10 IR/ER 2# Prone Ws 15x Prone retraction/extension 15x Supine press 2# 15x Supine flexion 2# 15x SO stretch over 1/2 roll 3 min Doorway/corner stertch, 30s x3    [facility] Adult PT Treatment:                                                DATE: 02/28/22 Therapeutic Exercise: UE 3/3 L1.5 Chin tucks supine 3x10 over 1/2 roll Supine horizontal abduction RTB 15x B Supine horizontal abduction RTB 15x alternating Supine alternating flexion RTB 10/10 Prone L extension 15x 2# Prone L flexion 15x 2# Prone L scaption 15x 2# Prone horizontal abduction 10/10 IR/ER 2# Prone Ws 15x Prone retraction/extension 15x L scalene stretch 30s x3 targeting each head Grip 35# B SO stretch over 1/2 roll 3 min Doorway/corner stertch, 30s x3  PATIENT EDUCATION:  Education details: Discussed eval findings, rehab rationale and POC and patient is in agreement  Person educated: Patient Education method: Explanation and Demonstration Education comprehension: verbalized understanding and needs further education     HOME EXERCISE PROGRAM: Access Code: [email or web address] URL: [email or web address] Date: 03/12/2022 Prepared by: Evelene Croon  Exercises - Seated Cervical Retraction  - 2 x daily - 7 x weekly - 2 sets - 10 reps -  Supine Chin Tuck  - 2 x daily - 7 x weekly - 2 sets - 10 reps - Doorway Pec Stretch at 90 Degrees Abduction  - 2 x daily - 7 x weekly - 1 sets - 3 reps - 30s hold - Supine Shoulder Horizontal Abduction with Resistance  - 1 x daily - 7 x weekly - 3 sets - 10 reps - Standing Shoulder Diagonal Horizontal Abduction 60/120 Degrees with Resistance  - 1 x daily - 7 x weekly - 3 sets - 10 reps   ASSESSMENT:   CLINICAL IMPRESSION: Patient presents to PT with moderate pain in her R upper trap and reports HEP compliance. She also reports she and her sisters continue to go to the gym 5 days a week. She reports occasional numbness/tingling in bilateral hands. Session today focused on periscapular and shoulder strengthening as well as stretching for the pecs and upper traps. Updated HEP with patient demonstrating understanding. She reports an increase in neck pain with overhead motions, particularly the prone alternating UE/LE lift, further exercises limited in overhead motions. Patient continues to benefit from skilled PT services and should be progressed as able to improve functional independence.    OBJECTIVE IMPAIRMENTS decreased knowledge of condition, decreased ROM, decreased strength, increased muscle  spasms, impaired sensation, impaired UE functional use, postural dysfunction, and pain.    ACTIVITY LIMITATIONS cleaning, driving, and meal prep.    PERSONAL FACTORS Fitness, Past/current experiences, and Time since onset of injury/illness/exacerbation are also affecting patient's functional outcome.      REHAB POTENTIAL: Good   CLINICAL DECISION MAKING: Evolving/moderate complexity   EVALUATION COMPLEXITY: Moderate     GOALS: Goals reviewed with patient? Yes   SHORT TERM GOALS: Target date: 02/27/2022   Patient to demonstrate independence in HEP  Baseline: [id number] Goal status: Met   2.  Increase grip strength to 29# indicating 50% improvement Baseline: 23#; 02/28/22 35# B Goal status: Met    3.  Patient to demo proper posture when cued Baseline: forward head and rounded shoulder posture Goal status: Met       LONG TERM GOALS: Target date: 03/27/2022   Increase FOTO score to 54 Baseline: 40; 03/07/22 51 Goal status: Ongoing   2.  Increase all deficient CROM to 75% Baseline:  Active ROM A/PROM (deg) 02/13/2022 AROM 02/28/22  Flexion 75%   Extension 10% 90%  Right lateral flexion 50% 90%  Left lateral flexion 25% 90%  Right rotation 75% 90%  Left rotation 50% 90%    Goal status: Ongoing as consistency not established   3.  Increase all deficient L shoulder strengths to 4+/5 Baseline:  MMT Right 02/13/2022 Left 02/13/2022  Shoulder flexion 5 4  Shoulder extension 5 4  Shoulder abduction 5 4    Goal status: INITIAL   4.  4/10 pain throughout neck and LUE Baseline: 6/10 Goal status: INITIAL     PLAN: PT FREQUENCY: 2x/week   PT DURATION: 4 weeks   PLANNED INTERVENTIONS: Therapeutic exercises, Therapeutic activity, Neuromuscular re-education, Balance training, Gait training, Patient/Family education, Joint mobilization, Dry Needling, and Manual therapy   PLAN FOR NEXT SESSION: HEP update, posterior shoulder strengthening, postural retraining, stretching of L neck musculature, f/u on soreness, continue to emphasize posture, address L SB pain    Evelene Croon, PTA 03/12/2022, 12:10 PM

## 2022-03-14 ENCOUNTER — Ambulatory Visit: Payer: Medicare Other | Attending: Diagnostic Neuroimaging

## 2022-03-14 DIAGNOSIS — M5412 Radiculopathy, cervical region: Secondary | ICD-10-CM | POA: Insufficient documentation

## 2022-03-14 DIAGNOSIS — R293 Abnormal posture: Secondary | ICD-10-CM | POA: Insufficient documentation

## 2022-03-14 DIAGNOSIS — M542 Cervicalgia: Secondary | ICD-10-CM | POA: Diagnosis not present

## 2022-03-14 NOTE — Therapy (Signed)
OUTPATIENT PHYSICAL THERAPY TREATMENT NOTE/DC SUMMARY   Patient Name: Michele Wood MRN: [id number] DOB:May 07, 1961, 61 y.o., female 60 Date: 03/14/2022  PCP: Ladell Pier, MD REFERRING PROVIDER: Bess Harvest MD PHYSICAL THERAPY DISCHARGE SUMMARY  Visits from Start of Care: 8  Current functional level related to goals / functional outcomes: Goals met   Remaining deficits: none   Education / Equipment: HEP   Patient agrees to discharge. Patient goals were met. Patient is being discharged due to being pleased with the current functional level.  END OF SESSION:   PT End of Session - 03/14/22 1212     Visit Number 8    Number of Visits 8    Date for PT Re-Evaluation 03/13/22    Authorization Type THN    PT Start Time 1215    PT Stop Time 1255    PT Time Calculation (min) 40 min    Activity Tolerance Patient tolerated treatment well    Behavior During Therapy WFL for tasks assessed/performed                Past Medical History:  Diagnosis Date   Cervical radiculopathy    Chronic back pain    DDD (degenerative disc disease), cervical    Depression    Diabetes mellitus without complication ([place])    Hypertension    at age 89   Obesity    Pancreatic cyst 05/31/2019   Incidental - discovered on CT scan   s/p minimally-invasive resection of left atrial myxoma 06/08/2019   Sinusitis    Sleep apnea    at age 89   Past Surgical History:  Procedure Laterality Date   ABDOMINAL HYSTERECTOMY     BACK SURGERY  2020   lower back   BREAST BIOPSY Right 07/02/2006   [place] Core benign   EXPLORATION POST OPERATIVE OPEN HEART  2020   MINIMALLY INVASIVE EXCISION OF ATRIAL MYXOMA N/A 06/08/2019   Procedure: MINIMALLY INVASIVE RESECTION OF LEFT ATRIAL MYXOMA;  Surgeon: [name], MD;  Location: [place];  Service: Open Heart Surgery;  Laterality: N/A;   NASAL SINUS SURGERY     TEE WITHOUT CARDIOVERSION N/A 05/17/2019   Procedure: TRANSESOPHAGEAL  ECHOCARDIOGRAM (TEE);  Surgeon: Acie Fredrickson Wonda Cheng, MD;  Location: [place];  Service: Cardiovascular;  Laterality: N/A;   TEE WITHOUT CARDIOVERSION N/A 06/08/2019   Procedure: TRANSESOPHAGEAL ECHOCARDIOGRAM (TEE);  Surgeon: [name], MD;  Location: [place];  Service: Open Heart Surgery;  Laterality: N/A;   Patient Active Problem List   Diagnosis Date Noted   Hyperlipidemia associated with type 2 diabetes mellitus ([place]) 02/28/2020   Hypotension 07/19/2019   CAD (coronary artery disease) 06/23/2019   S/P minimally-invasive resection of left atrial myxoma 06/08/2019   Pancreatic cyst 05/31/2019   Spondylolisthesis of lumbar region 03/31/2019   Atrial myxoma 03/19/2019   Dyslipidemia, goal LDL below 70 12/16/2018   Family history of heart disease 12/16/2018   Left bundle branch block 12/16/2018   History of chest pain 12/16/2018   MDD (major depressive disorder), recurrent episode, moderate ([place]) 10/19/2018   Non-insulin treated type 2 diabetes mellitus ([place]) 12/12/2017   Morbid obesity ([place]) 09/12/2017   Anxiety and depression 06/13/2017   Cervical radiculopathy due to intervertebral disc disorder 06/13/2017   Chronic low back pain without sciatica 09/18/2015   DJD (degenerative joint disease), lumbar 06/08/2015   Vitamin D insufficiency 04/25/2014   HTN (hypertension) 06/21/2008    REFERRING DIAG: M54.12 (ICD-10-CM) - Left cervical radiculopathy  THERAPY DIAG: Left cervical radiculopathy    PERTINENT HISTORY: 61 year old female here for evaluation of left arm and hand numbness.  Symptoms started around 2014.  Has had intermittent chronic posterior neck pain, left arm pain, left elbow pain, numbness and tingling into the forearm and hand, digits 4 and 5.  She has had 3 MRIs since 2014.  Last MRI in 2019.  She has seen neurosurgery for her neck and low back in 2019.  Symptoms tend to come on and go away on their own.  Symptoms have been aggravated in last 2 to 3 months.   Patient went to PCP and was tried gabapentin, Robaxin and prednisone without relief.    PRECAUTIONS: None  SUBJECTIVE: [place] reported over the past few days, she feels ready for self management.  PAIN:  Are you having pain? No   OBJECTIVE: (objective measures completed at initial evaluation unless otherwise dated)     OBJECTIVE:    DIAGNOSTIC FINDINGS:  None noted recently   PATIENT SURVEYS:  FOTO 40(54 predicted); 03/07/22 51     COGNITION: Overall cognitive status: Within functional limits for tasks assessed     SENSATION: Light touch: Impaired 4/5 LUE digits   POSTURE:  Rounded shoulders and forward head    PALPATION: TPs note           CERVICAL ROM:    Active ROM A/PROM (deg) 02/13/2022  Flexion 75%  Extension 10%  Right lateral flexion 50%  Left lateral flexion 25%  Right rotation 75%  Left rotation 50%   (Blank rows = not tested)   UE ROM: AROM WFL throughout   Active ROM Right 02/13/2022 Left 02/13/2022  Shoulder flexion      Shoulder extension      Shoulder abduction      Shoulder adduction      Shoulder extension      Shoulder internal rotation      Shoulder external rotation      Elbow flexion      Elbow extension      Wrist flexion      Wrist extension      Wrist ulnar deviation      Wrist radial deviation      Wrist pronation      Wrist supination       (Blank rows = not tested)   UE MMT:   MMT Right 02/13/2022 Left 02/13/2022  Shoulder flexion 5 4  Shoulder extension 5 4  Shoulder abduction 5 4  Shoulder adduction      Shoulder extension      Shoulder internal rotation      Shoulder external rotation      Middle trapezius      Lower trapezius      Elbow flexion 5 5  Elbow extension 5 5  Wrist flexion      Wrist extension      Wrist ulnar deviation      Wrist radial deviation      Wrist pronation      Wrist supination      Grip strength 35 23   (Blank rows = not tested)   CERVICAL SPECIAL TESTS:  Spurling's test:  Negative     FUNCTIONAL TESTS:  Deferred based on diagnosis   PATIENT SURVEYS:  FOTO 40; 03/07/22 51   TODAY'S TREATMENT:  [facility] Adult PT Treatment:  DATE: 03/14/22 Therapeutic Exercise: UE 3/3 L2 Supine horizontal abduction RTB 15x Supine flexion RTB 15/15 B Supine chin tucks 3x10 Prone T lift 2# 15x Prone shoulder extension 2# 15x Prone shoulder flexion 2# 15x Prone scaption 2# 15x Supine press 2# 15x Supine flexion 2# 15x Doorway/corner stretch, 30s x3  [facility] Adult PT Treatment:                                                DATE: 03/12/2022 Therapeutic Exercise: UE 3/3 L2 Supine horizontal abduction GTB 2x10 Supine horizontal abduction GTB x10 Prone IYW lift x10 each Prone T lift 2# x10 Prone shoulder extension 2# 2x10 Supine press 2# 2x10 Supine flexion 2# 2x10 Doorway/corner stretch, 30s x3 Prone alternating UE/LE lift 2x10 BIL Upper trap stretch 2x30" BIL Levator scap stretch 2x30" BIL Seated scaption 2# 2x15 (to shoulder height)   [facility] Adult PT Treatment:                                                DATE: 03/07/22 Therapeutic Exercise: UE 3/3 L 2 Chin tucks supine 3x10 over 1/2 roll Supine horizontal abduction RTB 15x B Supine horizontal abduction RTB 15x alternating Prone L extension 15x 2# Prone L flexion 15x 2# Prone L scaption 15x 2# Prone horizontal abduction 10/10 IR/ER 2# Prone Ws 15x Prone retraction/extension 15x Supine press 2# 15x Supine flexion 2# 15x SO stretch over 1/2 roll 3 min Doorway/corner stertch, 30s x3   PATIENT EDUCATION:  Education details: Discussed eval findings, rehab rationale and POC and patient is in agreement  Person educated: Patient Education method: Explanation and Demonstration Education comprehension: verbalized understanding and needs further education     HOME EXERCISE PROGRAM: Access Code: [email or web address] URL: [email or web address] Date:  03/12/2022 Prepared by: Evelene Croon  Exercises - Seated Cervical Retraction  - 2 x daily - 7 x weekly - 2 sets - 10 reps - Supine Chin Tuck  - 2 x daily - 7 x weekly - 2 sets - 10 reps - Doorway Pec Stretch at 90 Degrees Abduction  - 2 x daily - 7 x weekly - 1 sets - 3 reps - 30s hold - Supine Shoulder Horizontal Abduction with Resistance  - 1 x daily - 7 x weekly - 3 sets - 10 reps - Standing Shoulder Diagonal Horizontal Abduction 60/120 Degrees with Resistance  - 1 x daily - 7 x weekly - 3 sets - 10 reps   ASSESSMENT:   CLINICAL IMPRESSION: All goals met, pain is minimal, patient continues to attend gym 5 days a week without limitation.    OBJECTIVE IMPAIRMENTS decreased knowledge of condition, decreased ROM, decreased strength, increased muscle spasms, impaired sensation, impaired UE functional use, postural dysfunction, and pain.    ACTIVITY LIMITATIONS cleaning, driving, and meal prep.    PERSONAL FACTORS Fitness, Past/current experiences, and Time since onset of injury/illness/exacerbation are also affecting patient's functional outcome.      REHAB POTENTIAL: Good   CLINICAL DECISION MAKING: Evolving/moderate complexity   EVALUATION COMPLEXITY: Moderate     GOALS: Goals reviewed with patient? Yes   SHORT TERM GOALS: Target date: 02/27/2022   Patient to demonstrate independence in HEP  Baseline: [id number] Goal status: Met   2.  Increase grip strength to 29# indicating 50% improvement Baseline: 23#; 02/28/22 35# B Goal status: Met   3.  Patient to demo proper posture when cued Baseline: forward head and rounded shoulder posture Goal status: Met       LONG TERM GOALS: Target date: 03/27/2022   Increase FOTO score to 54 Baseline: 40; 03/07/22 51; 03/14/22 FOTO 58 Goal status: Met   2.  Increase all deficient CROM to 75% Baseline:  Active ROM A/PROM (deg) 02/13/2022 AROM 02/28/22  Flexion 75% 90%  Extension 10% 90%  Right lateral flexion 50% 90%  Left  lateral flexion 25% 90%  Right rotation 75% 90%  Left rotation 50% 90%    Goal status: Met   3.  Increase all deficient L shoulder strengths to 4+/5 Baseline:  MMT Right 02/13/2022 Left 03/14/2022  Shoulder flexion 5 4+  Shoulder extension 5 4+  Shoulder abduction 5 4+    Goal status: Met   4.  4/10 pain throughout neck and LUE Baseline: 6/10; 03/14/22 0/10 reported today Goal status: Met     PLAN: PT FREQUENCY: 2x/week   PT DURATION: 4 weeks   PLANNED INTERVENTIONS: Therapeutic exercises, Therapeutic activity, Neuromuscular re-education, Balance training, Gait training, Patient/Family education, Joint mobilization, Dry Needling, and Manual therapy   PLAN FOR NEXT SESSION: DC to HEP   Lanice Shirts, PT 03/14/2022, 12:13 PM

## 2022-05-06 ENCOUNTER — Encounter: Payer: Self-pay | Admitting: Internal Medicine

## 2022-05-06 ENCOUNTER — Ambulatory Visit: Payer: Medicare Other | Attending: Internal Medicine | Admitting: Internal Medicine

## 2022-05-06 DIAGNOSIS — I152 Hypertension secondary to endocrine disorders: Secondary | ICD-10-CM | POA: Diagnosis not present

## 2022-05-06 DIAGNOSIS — Z1231 Encounter for screening mammogram for malignant neoplasm of breast: Secondary | ICD-10-CM

## 2022-05-06 DIAGNOSIS — E1159 Type 2 diabetes mellitus with other circulatory complications: Secondary | ICD-10-CM

## 2022-05-06 DIAGNOSIS — E785 Hyperlipidemia, unspecified: Secondary | ICD-10-CM

## 2022-05-06 DIAGNOSIS — Z23 Encounter for immunization: Secondary | ICD-10-CM | POA: Diagnosis not present

## 2022-05-06 DIAGNOSIS — F320 Major depressive disorder, single episode, mild: Secondary | ICD-10-CM

## 2022-05-06 DIAGNOSIS — E1169 Type 2 diabetes mellitus with other specified complication: Secondary | ICD-10-CM | POA: Diagnosis not present

## 2022-05-06 LAB — POCT GLYCOSYLATED HEMOGLOBIN (HGB A1C): HbA1c, POC (controlled diabetic range): 6.1 % (ref 0.0–7.0)

## 2022-05-06 LAB — GLUCOSE, POCT (MANUAL RESULT ENTRY): POC Glucose: 95 mg/dl (ref 70–99)

## 2022-05-06 MED ORDER — ZOSTER VAC RECOMB ADJUVANTED 50 MCG/0.5ML IM SUSR: 0.5000 mL | Freq: Once | INTRAMUSCULAR | 0 refills | Status: AC

## 2022-05-06 NOTE — Progress Notes (Signed)
Patient ID: ALEXXUS SOBH, female    DOB: 1961/06/19  MRN: [id number]  CC: Diabetes   Subjective: Michele Wood is a 61 y.o. female who presents for chronic disease management.  She has her young grandson with her. Her concerns today include:  Hx of HTN, LT atrial myxoma (s/p minimally invasive surgery 05/2019), minimal CAD on cardiac CT, chronic sinusitis, degen disc of c-spine, chronic LBP, vit D def, obesity, anx/dep,  DM (with ? neuropathy in feet from DM vs lower back), dep with psychosis.  DM/obesity:  Results for orders placed or performed in visit on 05/06/22  POCT glucose (manual entry)  Result Value Ref Range   POC Glucose 95 70 - 99 mg/dl  POCT glycosylated hemoglobin (Hb A1C)  Result Value Ref Range   Hemoglobin A1C     HbA1c POC (<> result, manual entry)     HbA1c, POC (prediabetic range)     HbA1c, POC (controlled diabetic range) 6.1 0.0 - 7.0 %  taking and tolerating Metformin 500 mg BID Misplaced her machine.  Would like new device Going to gym daily with sisters to swim and ride the bike 6 miles.  Does not plan to go this wk as sisters on vacation Down 11 lbs since 11/2021.  "I love fruits and I try to limit what I eat."   HTN: reports compliance with meds but did not take as yet for the morning.  She endorses being on Norvasc 10 mg, Diovan 40 mg, HCTZ 25 mg daily and metoprolol 50 mg twice a day. Limits salt in foods No device to check BP  HL: taking Lipitor 40 mg daily and tolerating  Mental Health:  not seeing any one right now.  Sisters keep her busy and has a new grand-baby and other grandkids whom she helps to take care off.  Still taking Cymbalta and finds it helpful Patient Active Problem List   Diagnosis Date Noted   Hyperlipidemia associated with type 2 diabetes mellitus ([place]) 02/28/2020   Hypotension 07/19/2019   CAD (coronary artery disease) 06/23/2019   S/P minimally-invasive resection of left atrial myxoma 06/08/2019   Pancreatic cyst  05/31/2019   Spondylolisthesis of lumbar region 03/31/2019   Atrial myxoma 03/19/2019   Dyslipidemia, goal LDL below 70 12/16/2018   Family history of heart disease 12/16/2018   Left bundle branch block 12/16/2018   History of chest pain 12/16/2018   MDD (major depressive disorder), recurrent episode, moderate ([place]) 10/19/2018   Non-insulin treated type 2 diabetes mellitus ([place]) 12/12/2017   Morbid obesity ([place]) 09/12/2017   Anxiety and depression 06/13/2017   Cervical radiculopathy due to intervertebral disc disorder 06/13/2017   Chronic low back pain without sciatica 09/18/2015   DJD (degenerative joint disease), lumbar 06/08/2015   Vitamin D insufficiency 04/25/2014   HTN (hypertension) 06/21/2008     Current Outpatient Medications on File Prior to Visit  Medication Sig Dispense Refill   acetaminophen (TYLENOL 8 HOUR) 650 MG CR tablet Take 1 tablet (650 mg total) by mouth every 8 (eight) hours as needed for pain or fever. 30 tablet 0   amLODipine (NORVASC) 10 MG tablet Take 1 tablet (10 mg total) by mouth daily. 90 tablet 1   aspirin (ASPIRIN LOW DOSE) 81 MG EC tablet Take 1 tablet (81 mg total) by mouth daily. Swallow whole. 90 tablet 1   atorvastatin (LIPITOR) 40 MG tablet Take 1 tablet (40 mg total) by mouth daily. 90 tablet 3   Cholecalciferol (VITAMIN D3) 2000  UNITS TABS Take 2,000 Units by mouth daily. (Patient taking differently: Take 2,000 Units by mouth 2 (two) times a week.) 30 tablet 11   DULoxetine (CYMBALTA) 60 MG capsule Take 1 capsule (60 mg total) by mouth daily. 90 capsule 1   gabapentin (NEURONTIN) 300 MG capsule Take 1 capsule (300 mg total) by mouth at bedtime. For nerve pain 90 capsule 1   glucose blood (CONTOUR NEXT TEST) test strip Use as instructed 100 each 12   hydrochlorothiazide (HYDRODIURIL) 25 MG tablet Take 1 tablet (25 mg total) by mouth daily. 90 tablet 3   hydrOXYzine (VISTARIL) 25 MG capsule TAKE 1 CAPSULE (25 MG TOTAL) BY MOUTH AT BEDTIME AS NEEDED  FOR ANXIETY. 90 capsule 1   Lurasidone HCl 60 MG TABS Take 1 tablet (60 mg total) by mouth daily with breakfast. 30 tablet 2   metFORMIN (GLUCOPHAGE) 500 MG tablet Take 1 tablet (500 mg total) by mouth 2 (two) times daily with a meal. TAKE 1 TABLET BY MOUTH EVERY DAY WITH BREAKFAST 180 tablet 1   metoprolol tartrate (LOPRESSOR) 50 MG tablet Take 1 tablet (50 mg total) by mouth 2 (two) times daily. 180 tablet 1   montelukast (SINGULAIR) 10 MG tablet Take 1 tablet (10 mg total) by mouth daily. 90 tablet 1   mupirocin ointment (BACTROBAN) 2 % APPLY NASALLY TWICE A DAY FOR 5 DAYS     triamcinolone (NASACORT AQ) 55 MCG/ACT AERO nasal inhaler Place 2 sprays into the nose daily. 1 each 12   valsartan (DIOVAN) 40 MG tablet Take 1 tablet (40 mg total) by mouth daily. For blood pressure 90 tablet 3   No current facility-administered medications on file prior to visit.    Allergies  Allergen Reactions   Codeine Sulfate Rash   Milk-Related Compounds Diarrhea and Other (See Comments)    Diarrhea and stomach upset    Social History   Socioeconomic History   Marital status: Married    Spouse name: John   Number of children: 2   Years of education: Not on file   Highest education level: Some college, no degree  Occupational History    Comment: home maker  Tobacco Use   Smoking status: Never   Smokeless tobacco: Never  Vaping Use   Vaping Use: Never used  Substance and Sexual Activity   Alcohol use: Yes    Alcohol/week: 0.0 standard drinks of alcohol    Comment: rare   Drug use: No   Sexual activity: Not Currently  Other Topics Concern   Not on file  Social History Narrative   Lives with husband   Social Determinants of Health   Financial Resource Strain: High Risk (01/21/2018)   Overall Financial Resource Strain (CARDIA)    Difficulty of Paying Living Expenses: Very hard  Food Insecurity: Food Insecurity Present (01/21/2018)   Hunger Vital Sign    Worried About Running Out of Food  in the Last Year: Often true    Ran Out of Food in the Last Year: Often true  Transportation Needs: No Transportation Needs (01/21/2018)   PRAPARE - Hydrologist (Medical): No    Lack of Transportation (Non-Medical): No  Physical Activity: Sufficiently Active (01/21/2018)   Exercise Vital Sign    Days of Exercise per Week: 7 days    Minutes of Exercise per Session: 60 min  Stress: Stress Concern Present (01/21/2018)   [place]    Feeling of  Stress : Very much  Social Connections: Unknown (01/21/2018)   Social Connection and Isolation Panel [NHANES]    Frequency of Communication with Friends and Family: Three times a week    Frequency of Social Gatherings with Friends and Family: Not on file    Attends Religious Services: Not on file    Active Member of Clubs or Organizations: Not on file    Attends Archivist Meetings: Not on file    Marital Status: Not on file  Intimate Partner Violence: Not At Risk (01/21/2018)   Humiliation, Afraid, Rape, and Kick questionnaire    Fear of Current or Ex-Partner: No    Emotionally Abused: No    Physically Abused: No    Sexually Abused: No    Family History  Problem Relation Age of Onset   Hypertension Mother    Schizophrenia Brother    Hypertension Other     Past Surgical History:  Procedure Laterality Date   ABDOMINAL HYSTERECTOMY     BACK SURGERY  2020   lower back   BREAST BIOPSY Right 07/02/2006   [place] Core benign   EXPLORATION POST OPERATIVE OPEN HEART  2020   MINIMALLY INVASIVE EXCISION OF ATRIAL MYXOMA N/A 06/08/2019   Procedure: MINIMALLY INVASIVE RESECTION OF LEFT ATRIAL MYXOMA;  Surgeon: [name], MD;  Location: [facility];  Service: Open Heart Surgery;  Laterality: N/A;   NASAL SINUS SURGERY     TEE WITHOUT CARDIOVERSION N/A 05/17/2019   Procedure: TRANSESOPHAGEAL ECHOCARDIOGRAM (TEE);  Surgeon: Acie Fredrickson Wonda Cheng, MD;   Location: [place];  Service: Cardiovascular;  Laterality: N/A;   TEE WITHOUT CARDIOVERSION N/A 06/08/2019   Procedure: TRANSESOPHAGEAL ECHOCARDIOGRAM (TEE);  Surgeon: [name], MD;  Location: [facility];  Service: Open Heart Surgery;  Laterality: N/A;    ROS: Review of Systems Negative except as stated above  PHYSICAL EXAM: BP 120/86   Pulse 74   Temp 98.1 F (36.7 C) (Oral)   Ht '5\' 2"'$  (1.575 m)   Wt 248 lb 3.2 oz (112.6 kg)   LMP 09/01/2013   SpO2 99%   BMI 45.40 kg/m   Wt Readings from Last 3 Encounters:  05/06/22 248 lb 3.2 oz (112.6 kg)  01/31/22 252 lb 12.8 oz (114.7 kg)  12/05/21 259 lb (117.5 kg)    Physical Exam  General appearance - alert, well appearing, obese middle-aged older African-American female and in no distress Mental status - normal mood, behavior, speech, dress, motor activity, and thought processes Neck - supple, no significant adenopathy Chest - clear to auscultation, no wheezes, rales or rhonchi, symmetric air entry Heart - normal rate, regular rhythm, normal S1, S2, no murmurs, rubs, clicks or gallops Extremities - peripheral pulses normal, no pedal edema, no clubbing or cyanosis Diabetic Foot Exam - Simple   Simple Foot Form Diabetic Foot exam was performed with the following findings: Yes 05/06/2022 11:50 AM  Visual Inspection See comments: Yes Sensation Testing Intact to touch and monofilament testing bilaterally: Yes Pulse Check Posterior Tibialis and Dorsalis pulse intact bilaterally: Yes Comments Callous on medial aspect RT big toe       05/06/2022   11:47 AM 12/05/2021    9:58 AM 02/13/2021    2:46 PM  Depression screen PHQ 2/9  Decreased Interest '1 1 1  '$ Down, Depressed, Hopeless '1 1 1  '$ PHQ - 2 Score '2 2 2  '$ Altered sleeping '1 1 2  '$ Tired, decreased energy '1 1 1  '$ Change in appetite 1 1 1  Feeling bad or failure about yourself  1 1 0  Trouble concentrating 1 1 0  Moving slowly or fidgety/restless 1 2 0  Suicidal thoughts 0  0 0  PHQ-9 Score '8 9 6      '$ 05/06/2022   11:48 AM 12/05/2021    9:58 AM 02/13/2021    2:47 PM 11/01/2019    4:51 PM  GAD 7 : Generalized Anxiety Score  Nervous, Anxious, on Edge 0 '1 1 1  '$ Control/stop worrying '1 1 1 1  '$ Worry too much - different things 0 '1 1 2  '$ Trouble relaxing 0 0 1 2  Restless 1 0 0 1  Easily annoyed or irritable 0 0 0 1  Afraid - awful might happen 0 0 1 1  Total GAD 7 Score '2 3 5 9        '$ Latest Ref Rng & Units 12/05/2021   10:25 AM 08/18/2020   11:54 AM 11/05/2019   10:03 AM  CMP  Glucose 70 - 99 mg/dL 85  76  92   BUN 6 - 24 mg/dL '13  9  14   '$ Creatinine 0.57 - 1.00 mg/dL 0.84  0.86  0.86   Sodium 134 - 144 mmol/L 141  138  145   Potassium 3.5 - 5.2 mmol/L 4.2  4.5  3.7   Chloride 96 - 106 mmol/L 105  100  103   CO2 20 - 29 mmol/L '23  27  26   '$ Calcium 8.7 - 10.2 mg/dL 9.9  9.3  9.9   Total Protein 6.0 - 8.5 g/dL 7.3  7.5    Total Bilirubin 0.0 - 1.2 mg/dL 0.4  0.4    Alkaline Phos 44 - 121 IU/L 133  108    AST 0 - 40 IU/L 16  18    ALT 0 - 32 IU/L 17  18     Lipid Panel     Component Value Date/Time   CHOL 162 12/05/2021 1025   TRIG 108 12/05/2021 1025   HDL 42 12/05/2021 1025   CHOLHDL 3.9 12/05/2021 1025   CHOLHDL 6.1 10/26/2013 0949   VLDL 36 10/26/2013 0949   LDLCALC 100 (H) 12/05/2021 1025    CBC    Component Value Date/Time   WBC 6.0 12/05/2021 1025   WBC 10.8 (H) 06/27/2019 0040   RBC 4.99 12/05/2021 1025   RBC 4.15 06/27/2019 0040   HGB 12.1 12/05/2021 1025   HCT 37.4 12/05/2021 1025   PLT 319 12/05/2021 1025   MCV 75 (L) 12/05/2021 1025   MCH 24.2 (L) 12/05/2021 1025   MCH 23.9 (L) 06/27/2019 0040   MCHC 32.4 12/05/2021 1025   MCHC 30.0 06/27/2019 0040   RDW 13.9 12/05/2021 1025   LYMPHSABS 2.1 12/05/2021 1025   MONOABS 0.5 12/03/2013 1508   EOSABS 0.1 12/05/2021 1025   BASOSABS 0.0 12/05/2021 1025    ASSESSMENT AND PLAN:  1. Type 2 diabetes mellitus with morbid obesity (HCC) A1c is at goal.  Continue  metformin. Commended her on weight loss so far.  Encouraged her to continue regular exercise and healthy eating habits. - POCT glucose (manual entry) - POCT glycosylated hemoglobin (Hb A1C) - Ambulatory referral to Ophthalmology - Zoster Vaccine Adjuvanted [facility]) injection; Inject 0.5 mLs into the muscle once for 1 dose.  Dispense: 0.5 mL; Refill: 0  2. Hyperlipidemia associated with type 2 diabetes mellitus (HCC) Last LDL was 100 which was above goal.  However at that time I think she was  out of the Lipitor.  Continue Lipitor 40 mg daily.  3. Hypertension associated with diabetes ([place]) Close to goal.  She has not taken her medicines as yet for today.  No changes made in medications.  She will continue HCTZ 25 mg, Norvasc 10 mg, Diovan 40 mg daily and metoprolol 50 mg twice a day  4. Mild major depression (HCC) Continue Cymbalta.  Patient reports she is doing better and is staying busy.  5. Encounter for screening mammogram for malignant neoplasm of breast Agreeable for referral for mammogram. - MM Digital Screening; Future  6. Need for shingles vaccine Due for shingles vaccine.  She is agreeable to receiving the vaccine.  I have printed the prescription and given it to her to take to her outside pharmacy to get the first Shingrix vaccine.    Patient was given the opportunity to ask questions.  Patient verbalized understanding of the plan and was able to repeat key elements of the plan.   This documentation was completed using Radio producer.  Any transcriptional errors are unintentional.  Orders Placed This Encounter  Procedures   MM Digital Screening   Ambulatory referral to Ophthalmology   POCT glucose (manual entry)   POCT glycosylated hemoglobin (Hb A1C)     Requested Prescriptions   Signed Prescriptions Disp Refills   Zoster Vaccine Adjuvanted (SHINGRIX) injection 0.5 mL 0    Sig: Inject 0.5 mLs into the muscle once for 1 dose.    Return in  about 4 months (around 09/06/2022) for Give appt with [name] in 1 mth for Medicare Wellness Visit.  Karle Plumber, MD, FACP

## 2022-05-25 ENCOUNTER — Encounter: Payer: Self-pay | Admitting: *Deleted

## 2022-05-25 LAB — GLUCOSE, POCT (MANUAL RESULT ENTRY): POC Glucose: 106 mg/dl — AB (ref 70–99)

## 2022-06-04 ENCOUNTER — Ambulatory Visit
Admission: RE | Admit: 2022-06-04 | Discharge: 2022-06-04 | Disposition: A | Discharge status: Home / Self Care | Payer: Medicare Other | Source: Ambulatory Visit | Attending: Internal Medicine | Admitting: Internal Medicine

## 2022-06-04 DIAGNOSIS — Z1231 Encounter for screening mammogram for malignant neoplasm of breast: Secondary | ICD-10-CM | POA: Diagnosis not present

## 2022-06-10 ENCOUNTER — Ambulatory Visit: Payer: Medicare Other | Attending: Internal Medicine | Admitting: Pharmacist

## 2022-06-10 ENCOUNTER — Encounter: Payer: Self-pay | Admitting: Pharmacist

## 2022-06-10 VITALS — Ht 62.0 in | Wt 246.2 lb

## 2022-06-10 DIAGNOSIS — Z0000 Encounter for general adult medical examination without abnormal findings: Secondary | ICD-10-CM

## 2022-06-10 NOTE — Progress Notes (Signed)
Subjective:   Michele Wood is a 61 y.o. female who presents for an Initial Medicare Annual Wellness Visit.     Objective:    Today's Vitals   06/10/22 1419 06/10/22 1426  Weight: 246 lb 3.2 oz (111.7 kg)   Height: '5\' 2"'$  (1.575 m)   PainSc: 7  7   PainLoc: Hip    Body mass index is 45.03 kg/m.     06/10/2022    2:37 PM 02/13/2021    2:47 PM 01/30/2021    9:34 AM 06/27/2019    4:44 AM 06/08/2019    6:03 AM 06/04/2019   11:22 AM 05/17/2019    8:45 AM  Advanced Directives  Does Patient Have a Medical Advance Directive? No No No No No No No  Would patient like information on creating a medical advance directive? No - Patient declined No - Patient declined  No - Patient declined No - Patient declined No - Patient declined No - Patient declined    Current Medications (verified) Outpatient Encounter Medications as of 06/10/2022  Medication Sig   acetaminophen (TYLENOL 8 HOUR) 650 MG CR tablet Take 1 tablet (650 mg total) by mouth every 8 (eight) hours as needed for pain or fever.   amLODipine (NORVASC) 10 MG tablet Take 1 tablet (10 mg total) by mouth daily.   aspirin (ASPIRIN LOW DOSE) 81 MG EC tablet Take 1 tablet (81 mg total) by mouth daily. Swallow whole.   atorvastatin (LIPITOR) 40 MG tablet Take 1 tablet (40 mg total) by mouth daily.   Cholecalciferol (VITAMIN D3) 2000 UNITS TABS Take 2,000 Units by mouth daily. (Patient taking differently: Take 2,000 Units by mouth 2 (two) times a week.)   DULoxetine (CYMBALTA) 60 MG capsule Take 1 capsule (60 mg total) by mouth daily.   gabapentin (NEURONTIN) 300 MG capsule Take 1 capsule (300 mg total) by mouth at bedtime. For nerve pain   glucose blood (CONTOUR NEXT TEST) test strip Use as instructed   hydrochlorothiazide (HYDRODIURIL) 25 MG tablet Take 1 tablet (25 mg total) by mouth daily.   hydrOXYzine (VISTARIL) 25 MG capsule TAKE 1 CAPSULE (25 MG TOTAL) BY MOUTH AT BEDTIME AS NEEDED FOR ANXIETY.   Lurasidone HCl 60 MG TABS Take 1  tablet (60 mg total) by mouth daily with breakfast.   metFORMIN (GLUCOPHAGE) 500 MG tablet Take 1 tablet (500 mg total) by mouth 2 (two) times daily with a meal. TAKE 1 TABLET BY MOUTH EVERY DAY WITH BREAKFAST   metoprolol tartrate (LOPRESSOR) 50 MG tablet Take 1 tablet (50 mg total) by mouth 2 (two) times daily.   montelukast (SINGULAIR) 10 MG tablet Take 1 tablet (10 mg total) by mouth daily.   valsartan (DIOVAN) 40 MG tablet Take 1 tablet (40 mg total) by mouth daily. For blood pressure   [DISCONTINUED] mupirocin ointment (BACTROBAN) 2 % APPLY NASALLY TWICE A DAY FOR 5 DAYS   [DISCONTINUED] triamcinolone (NASACORT AQ) 55 MCG/ACT AERO nasal inhaler Place 2 sprays into the nose daily.   No facility-administered encounter medications on file as of 06/10/2022.    Allergies (verified) Codeine sulfate and Milk-related compounds   History: Past Medical History:  Diagnosis Date   Cervical radiculopathy    Chronic back pain    DDD (degenerative disc disease), cervical    Depression    Diabetes mellitus without complication (HCC)    Hypertension    at age 41   Obesity    Pancreatic cyst 05/31/2019   Incidental -  discovered on CT scan   s/p minimally-invasive resection of left atrial myxoma 06/08/2019   Sinusitis    Sleep apnea    at age 89   Past Surgical History:  Procedure Laterality Date   ABDOMINAL HYSTERECTOMY     BACK SURGERY  2020   lower back   BREAST BIOPSY Right 07/02/2006   [place] Core benign   EXPLORATION POST OPERATIVE OPEN HEART  2020   MINIMALLY INVASIVE EXCISION OF ATRIAL MYXOMA N/A 06/08/2019   Procedure: MINIMALLY INVASIVE RESECTION OF LEFT ATRIAL MYXOMA;  Surgeon: [name], MD;  Location: [facility];  Service: Open Heart Surgery;  Laterality: N/A;   NASAL SINUS SURGERY     TEE WITHOUT CARDIOVERSION N/A 05/17/2019   Procedure: TRANSESOPHAGEAL ECHOCARDIOGRAM (TEE);  Surgeon: Acie Fredrickson Wonda Cheng, MD;  Location: [facility];  Service: Cardiovascular;  Laterality: N/A;    TEE WITHOUT CARDIOVERSION N/A 06/08/2019   Procedure: TRANSESOPHAGEAL ECHOCARDIOGRAM (TEE);  Surgeon: [name], MD;  Location: [facility];  Service: Open Heart Surgery;  Laterality: N/A;   Family History  Problem Relation Age of Onset   Hypertension Mother    Schizophrenia Brother    Hypertension Other    Social History   Socioeconomic History   Marital status: Married    Spouse name: John   Number of children: 2   Years of education: Not on file   Highest education level: Some college, no degree  Occupational History    Comment: home maker  Tobacco Use   Smoking status: Never   Smokeless tobacco: Never  Vaping Use   Vaping Use: Never used  Substance and Sexual Activity   Alcohol use: Yes    Alcohol/week: 0.0 standard drinks of alcohol    Comment: rare   Drug use: No   Sexual activity: Not Currently  Other Topics Concern   Not on file  Social History Narrative   Lives with husband   Social Determinants of Health   Financial Resource Strain: High Risk (01/21/2018)   Overall Financial Resource Strain (CARDIA)    Difficulty of Paying Living Expenses: Very hard  Food Insecurity: Food Insecurity Present (01/21/2018)   Hunger Vital Sign    Worried About Running Out of Food in the Last Year: Often true    Ran Out of Food in the Last Year: Often true  Transportation Needs: No Transportation Needs (01/21/2018)   PRAPARE - Hydrologist (Medical): No    Lack of Transportation (Non-Medical): No  Physical Activity: Sufficiently Active (01/21/2018)   Exercise Vital Sign    Days of Exercise per Week: 7 days    Minutes of Exercise per Session: 60 min  Stress: Stress Concern Present (01/21/2018)   [place]    Feeling of Stress : Very much  Social Connections: Unknown (01/21/2018)   Social Connection and Isolation Panel [NHANES]    Frequency of Communication with Friends and Family:  Three times a week    Frequency of Social Gatherings with Friends and Family: Not on file    Attends Religious Services: Not on file    Active Member of Clubs or Organizations: Not on file    Attends Archivist Meetings: Not on file    Marital Status: Not on file    Tobacco Counseling Counseling given: Not Answered   Clinical Intake:  Pre-visit preparation completed: No  Pain : 0-10 Pain Score: 7  Pain Type: Chronic pain Pain Location:  Hip Pain Orientation: Right, Left Pain Descriptors / Indicators: Burning, Constant, Dull Pain Onset: More than a month ago (Previously recieved steroid injection but these did not help) Pain Frequency: Intermittent     Nutritional Status: BMI > 30  Obese Diabetes: No CBG done?: No CBG resulted in Enter/ Edit results?: No Did pt. bring in CBG monitor from home?: No  How often do you need to have someone help you when you read instructions, pamphlets, or other written materials from your doctor or pharmacy?: 3 - Sometimes  Diabetic? Yes  Interpreter Needed?: No  Information entered by :: [place]   Activities of Daily Living    06/10/2022    2:40 PM  In your present state of health, do you have any difficulty performing the following activities:  Hearing? 0  Vision? 0  Comment Dr. Katy Fitch  Difficulty concentrating or making decisions? 0  Walking or climbing stairs? 0  Dressing or bathing? 0  Comment Grab bars  Doing errands, shopping? 0  Preparing Food and eating ? N  Using the Toilet? N  In the past six months, have you accidently leaked urine? N  Do you have problems with loss of bowel control? N  Managing your Medications? N  Managing your Finances? N  Housekeeping or managing your Housekeeping? N    Patient Care Team: Ladell Pier, MD as PCP - General (Internal Medicine) Lorretta Harp, MD as PCP - Cardiology (Cardiology)  Indicate any recent Medical Services you may have received from other than [facility]  providers in the past year (date may be approximate).     Assessment:   This is a routine wellness examination for [place].  Hearing/Vision screen No results found.  Dietary issues and exercise activities discussed: Current Exercise Habits: Home exercise routine, Type of exercise: Other - see comments;walking (Swimming, physical therapy, Biking (~1 hour)), Time (Minutes): 60, Frequency (Times/Week): 5, Weekly Exercise (Minutes/Week): 300, Intensity: Moderate   Goals Addressed   None   Depression Screen    06/10/2022    2:38 PM 05/06/2022   11:47 AM 12/05/2021    9:58 AM 02/13/2021    2:46 PM 11/01/2019    4:51 PM 10/19/2018   10:21 AM 06/19/2018   10:33 AM  PHQ 2/9 Scores  PHQ - 2 Score 0 '2 2 2 2 2 2  '$ PHQ- 9 Score '4 8 9 6 10 12 12    '$ Fall Risk    06/10/2022    2:38 PM 05/06/2022   11:10 AM 12/05/2021    9:53 AM 02/13/2021    2:46 PM 08/18/2020   10:43 AM  Fall Risk   Falls in the past year? 0 0 0 1 0  Number falls in past yr: 0 0 0 1   Injury with Fall? 0 0 0 0   Risk for fall due to :  No Fall Risks     Follow up Falls evaluation completed;Education provided;Falls prevention discussed Falls evaluation completed  Education provided     FALL RISK PREVENTION PERTAINING TO THE HOME:  Any stairs in or around the home? No  If so, are there any without handrails? No  Home free of loose throw rugs in walkways, pet beds, electrical cords, etc? Yes  Adequate lighting in your home to reduce risk of falls? Yes   ASSISTIVE DEVICES UTILIZED TO PREVENT FALLS:  Life alert? No  Use of a cane, walker or w/c? No  Grab bars in the bathroom? Yes  Shower chair or  bench in shower?  No Elevated toilet seat or a handicapped toilet? No   TIMED UP AND GO:  Was the test performed? Yes .  Length of time to ambulate 10 feet: 8 sec.   Gait slow and steady without use of assistive device  Cognitive Function:    06/10/2022    2:42 PM 02/13/2021    2:47 PM  MMSE - Mini Mental State Exam   Orientation to time  5  Orientation to Place 5 5  Registration 3 3  Attention/ Calculation 5 5  Recall 3 3  Language- name 2 objects 2 2  Language- repeat 1 1  Language- follow 3 step command 3 3  Language- read & follow direction 1 1  Write a sentence 1 1  Copy design 1 0  Total score  29        Immunizations Immunization History  Administered Date(s) Administered   Influenza Inj Mdck Quad With Preservative 08/27/2019   Influenza Whole 09/13/2008, 08/29/2019   Influenza,inj,Quad PF,6+ Mos 09/22/2014, 09/18/2015, 08/22/2016, 06/13/2017, 06/19/2018, 08/18/2020   PFIZER(Purple Top)SARS-COV-2 Vaccination 01/25/2020, 02/22/2020   Pneumococcal Polysaccharide-23 10/19/2018   Tdap 09/03/2016    TDAP status: Up to date  Flu Vaccine status: Due, Education has been provided regarding the importance of this vaccine. Advised may receive this vaccine at local pharmacy or Health Dept. Aware to provide a copy of the vaccination record if obtained from local pharmacy or Health Dept. Verbalized acceptance and understanding.  Pneumococcal vaccine status: Up to date  Covid-19 vaccine status: Declined, Education has been provided regarding the importance of this vaccine but patient still declined. Advised may receive this vaccine at local pharmacy or Health Dept.or vaccine clinic. Aware to provide a copy of the vaccination record if obtained from local pharmacy or Health Dept. Verbalized acceptance and understanding.  Qualifies for Shingles Vaccine? Yes   Zostavax completed No   Shingrix Completed?: No.    Education has been provided regarding the importance of this vaccine. Patient has been advised to call insurance company to determine out of pocket expense if they have not yet received this vaccine. Advised may also receive vaccine at local pharmacy or Health Dept. Verbalized acceptance and understanding.  Screening Tests Health Maintenance  Topic Date Due   Zoster Vaccines- Shingrix (1 of  2) Never done   COVID-19 Vaccine (3 - Pfizer series) 04/18/2020   OPHTHALMOLOGY EXAM  11/14/2021   INFLUENZA VACCINE  05/14/2022   HEMOGLOBIN A1C  11/06/2022   FOOT EXAM  05/07/2023   Fecal DNA (Cologuard)  02/24/2024   MAMMOGRAM  06/04/2024   TETANUS/TDAP  09/03/2026   Hepatitis C Screening  Completed   HIV Screening  Completed   HPV VACCINES  Aged Out    Health Maintenance  Health Maintenance Due  Topic Date Due   Zoster Vaccines- Shingrix (1 of 2) Never done   COVID-19 Vaccine (3 - Pfizer series) 04/18/2020   OPHTHALMOLOGY EXAM  11/14/2021   INFLUENZA VACCINE  05/14/2022    Colorectal cancer screening: Type of screening: Cologuard. Completed 02/23/2021. Repeat every 3 years  Mammogram status: Completed 06/04/2022. Repeat every year  Lung Cancer Screening: (Low Dose CT Chest recommended if Age 67-80 years, 30 pack-year currently smoking OR have quit w/in 15years.) does not qualify.   Lung Cancer Screening Referral: None   Additional Screening:  Hepatitis C Screening: does not qualify; Completed 09/03/2016  Vision Screening: Recommended annual ophthalmology exams for early detection of glaucoma and other disorders of the eye.  Is the patient up to date with their annual eye exam?  Yes  Who is the provider or what is the name of the office in which the patient attends annual eye exams? Dr. Katy Fitch   Dental Screening: Recommended annual dental exams for proper oral hygiene  Community Resource Referral / Chronic Care Management: CRR required this visit?  No   CCM required this visit?  No      Plan:     I have personally reviewed and noted the following in the patient's chart:   Medical and social history Use of alcohol, tobacco or illicit drugs  Current medications and supplements including opioid prescriptions. Patient is not currently taking opioid prescriptions. Functional ability and status Nutritional status Physical activity Advanced directives List of  other physicians Hospitalizations, surgeries, and ER visits in previous 12 months Vitals Screenings to include cognitive, depression, and falls Referrals and appointments  In addition, I have reviewed and discussed with patient certain preventive protocols, quality metrics, and best practice recommendations. A written personalized care plan for preventive services as well as general preventive health recommendations were provided to patient.  Tresa Endo, RPH-CPP   06/10/2022

## 2022-06-18 ENCOUNTER — Emergency Department (HOSPITAL_COMMUNITY)
Admission: EM | Admit: 2022-06-18 | Discharge: 2022-06-18 | Disposition: A | Discharge status: Home / Self Care | Payer: Medicare Other | Attending: Emergency Medicine | Admitting: Emergency Medicine

## 2022-06-18 ENCOUNTER — Emergency Department (HOSPITAL_COMMUNITY): Payer: Medicare Other

## 2022-06-18 ENCOUNTER — Other Ambulatory Visit: Payer: Self-pay

## 2022-06-18 ENCOUNTER — Other Ambulatory Visit: Payer: Self-pay | Admitting: Physician Assistant

## 2022-06-18 DIAGNOSIS — E1169 Type 2 diabetes mellitus with other specified complication: Secondary | ICD-10-CM

## 2022-06-18 DIAGNOSIS — F331 Major depressive disorder, recurrent, moderate: Secondary | ICD-10-CM

## 2022-06-18 DIAGNOSIS — J324 Chronic pansinusitis: Secondary | ICD-10-CM

## 2022-06-18 DIAGNOSIS — M25551 Pain in right hip: Secondary | ICD-10-CM | POA: Diagnosis not present

## 2022-06-18 DIAGNOSIS — F4310 Post-traumatic stress disorder, unspecified: Secondary | ICD-10-CM

## 2022-06-18 DIAGNOSIS — I10 Essential (primary) hypertension: Secondary | ICD-10-CM | POA: Insufficient documentation

## 2022-06-18 DIAGNOSIS — Z7984 Long term (current) use of oral hypoglycemic drugs: Secondary | ICD-10-CM | POA: Insufficient documentation

## 2022-06-18 DIAGNOSIS — E119 Type 2 diabetes mellitus without complications: Secondary | ICD-10-CM | POA: Diagnosis not present

## 2022-06-18 DIAGNOSIS — M25552 Pain in left hip: Secondary | ICD-10-CM | POA: Insufficient documentation

## 2022-06-18 DIAGNOSIS — Z981 Arthrodesis status: Secondary | ICD-10-CM | POA: Diagnosis not present

## 2022-06-18 DIAGNOSIS — M5441 Lumbago with sciatica, right side: Secondary | ICD-10-CM | POA: Insufficient documentation

## 2022-06-18 DIAGNOSIS — Z7982 Long term (current) use of aspirin: Secondary | ICD-10-CM | POA: Insufficient documentation

## 2022-06-18 DIAGNOSIS — Z79899 Other long term (current) drug therapy: Secondary | ICD-10-CM | POA: Diagnosis not present

## 2022-06-18 MED ORDER — DICLOFENAC SODIUM 1 % EX GEL: 2.0000 g | Freq: Four times a day (QID) | CUTANEOUS | 0 refills | Status: DC

## 2022-06-18 MED ORDER — HYDROCODONE-ACETAMINOPHEN 5-325 MG PO TABS
1.0000 | ORAL_TABLET | Freq: Once | ORAL | Status: AC
  Administered 2022-06-18: 1 via ORAL
  Filled 2022-06-18: qty 1

## 2022-06-18 MED ORDER — LIDOCAINE 5 % EX PTCH
2.0000 | MEDICATED_PATCH | Freq: Once | CUTANEOUS | Status: DC
Start: 2022-06-18 — End: 2022-06-18
  Administered 2022-06-18: 2 via TRANSDERMAL
  Filled 2022-06-18: qty 2

## 2022-06-18 NOTE — Discharge Instructions (Addendum)
Your imaging today did not show evidence of significant fracture or osteoarthritis in the hips.    Please first follow-up with your neurosurgeon Dr. Arnoldo Morale for reevaluation and further medical management of your lower back.  His contact information has been provided for you.  You have also been provided the contact information for [facility], a local orthopedic.  Please contact them to establish care for you hips, IT band, and to consider physical therapy  Please continue to manage symptoms with Tylenol, topical Voltaren gel, and occasionally ibuprofen as needed.    Return to the ED for any new or worsening symptoms as discussed.

## 2022-06-18 NOTE — ED Provider Notes (Signed)
[facility] DEPT Provider Note   CSN: [id number] Arrival date & time: 06/18/22  1050     History  Chief Complaint  Patient presents with   Hip Pain    Michele Wood is a 61 y.o. female with history of sciatica and prior lumbar fusion presenting today with increasing bilateral hip pain over the last 3 days.  No preceding injury or recent trauma.  Right hip hurts more than the left.  Worsened with stairs and range of motion.  Relieved with swimming or rest.  Intermittent tingling sensations down the right leg, however none today.  Has tried using ointments, creams, and over-the-counter medications without relief.  Denies urinary or bowel incontinence, fevers, hemoptysis, or saddle anesthesia.  The history is provided by the patient and medical records.  Hip Pain    Home Medications Prior to Admission medications   Medication Sig Start Date End Date Taking? Authorizing Provider  diclofenac Sodium (VOLTAREN) 1 % GEL Apply 2 g topically 4 (four) times daily. 06/18/22  Yes Prince Rome, PA-C  acetaminophen (TYLENOL 8 HOUR) 650 MG CR tablet Take 1 tablet (650 mg total) by mouth every 8 (eight) hours as needed for pain or fever. 01/30/21   Varney Biles, MD  amLODipine (NORVASC) 10 MG tablet Take 1 tablet (10 mg total) by mouth daily. 12/05/21   Argentina Donovan, PA-C  aspirin (ASPIRIN LOW DOSE) 81 MG EC tablet Take 1 tablet (81 mg total) by mouth daily. Swallow whole. 12/05/21   Argentina Donovan, PA-C  atorvastatin (LIPITOR) 40 MG tablet Take 1 tablet (40 mg total) by mouth daily. 12/05/21   Argentina Donovan, PA-C  Cholecalciferol (VITAMIN D3) 2000 UNITS TABS Take 2,000 Units by mouth daily. Patient taking differently: Take 2,000 Units by mouth 2 (two) times a week. 09/19/15   Funches, Adriana Mccallum, MD  DULoxetine (CYMBALTA) 60 MG capsule Take 1 capsule (60 mg total) by mouth daily. 12/05/21   Argentina Donovan, PA-C  gabapentin (NEURONTIN) 300 MG capsule  Take 1 capsule (300 mg total) by mouth at bedtime. For nerve pain 12/05/21   Argentina Donovan, PA-C  glucose blood (CONTOUR NEXT TEST) test strip Use as instructed 12/18/20   Ladell Pier, MD  hydrochlorothiazide (HYDRODIURIL) 25 MG tablet Take 1 tablet (25 mg total) by mouth daily. 12/05/21   Argentina Donovan, PA-C  hydrOXYzine (VISTARIL) 25 MG capsule TAKE 1 CAPSULE (25 MG TOTAL) BY MOUTH AT BEDTIME AS NEEDED FOR ANXIETY. 12/27/21   Ladell Pier, MD  Lurasidone HCl 60 MG TABS Take 1 tablet (60 mg total) by mouth daily with breakfast. 02/01/21   Arfeen, Arlyce Harman, MD  metFORMIN (GLUCOPHAGE) 500 MG tablet Take 1 tablet (500 mg total) by mouth 2 (two) times daily with a meal. TAKE 1 TABLET BY MOUTH EVERY DAY WITH BREAKFAST 12/05/21   Freeman Caldron M, PA-C  metoprolol tartrate (LOPRESSOR) 50 MG tablet Take 1 tablet (50 mg total) by mouth 2 (two) times daily. 12/05/21   Argentina Donovan, PA-C  montelukast (SINGULAIR) 10 MG tablet Take 1 tablet (10 mg total) by mouth daily. 12/05/21   Argentina Donovan, PA-C  valsartan (DIOVAN) 40 MG tablet Take 1 tablet (40 mg total) by mouth daily. For blood pressure 12/05/21   Freeman Caldron M, PA-C      Allergies    Codeine sulfate and Milk-related compounds    Review of Systems   Review of Systems  Musculoskeletal:  Bilateral hip pain    Physical Exam Updated Vital Signs BP (!) 143/95   Pulse 75   Temp 98.1 F (36.7 C) (Oral)   Resp 16   Wt 111 kg   LMP 09/01/2013   SpO2 100%   BMI 44.76 kg/m  Physical Exam Vitals and nursing note reviewed.  Constitutional:      General: She is not in acute distress.    Appearance: She is well-developed. She is obese. She is not ill-appearing.  HENT:     Head: Normocephalic and atraumatic.  Eyes:     Conjunctiva/sclera: Conjunctivae normal.  Cardiovascular:     Rate and Rhythm: Normal rate and regular rhythm.     Pulses: Normal pulses.     Comments: PT pulses 2+ bilaterally Pulmonary:      Effort: Pulmonary effort is normal. No respiratory distress.     Breath sounds: Normal breath sounds.  Abdominal:     Palpations: Abdomen is soft.     Tenderness: There is no abdominal tenderness.  Musculoskeletal:        General: Tenderness present. No swelling.     Cervical back: Neck supple.     Right lower leg: No edema.     Left lower leg: No edema.     Comments: Bilateral tenderness of hip bursa, SI joints, and length of IT band.  ROM, sensation, strength appears grossly intact to both lower extremities.  Positive SLR of the right, negative on the left.  No evidence of obvious bony deformity, malrotation, shortening, ecchymosis, erythema, or warmth of the areas.  Skin:    General: Skin is warm and dry.     Capillary Refill: Capillary refill takes less than 2 seconds.     Findings: No erythema or rash.  Neurological:     Mental Status: She is alert and oriented to person, place, and time.     GCS: GCS eye subscore is 4. GCS verbal subscore is 5. GCS motor subscore is 6.     Comments: No saddle anesthesia.  Patellar and Achilles DTRs 2+ bilaterally  Psychiatric:        Mood and Affect: Mood normal.     ED Results / Procedures / Treatments   Labs (all labs ordered are listed, but only abnormal results are displayed) Labs Reviewed - No data to display  EKG None  Radiology DG Hips Bilat W or Wo Pelvis 3-4 Views  Result Date: 06/18/2022 CLINICAL DATA:  Bilateral hip pain for 3-4 years. Increasing pain over past few weeks. EXAM: DG HIP (WITH OR WITHOUT PELVIS) 3-4V BILAT COMPARISON:  CT lumbar spine 01/18/2021 FINDINGS: Again noted is posterior lumbar interbody fusion at L3-L5. Symmetric appearance of the SI joints. Both hips are located without a fracture. No significant joint space narrowing in either hip. IMPRESSION: No acute bone abnormality in the pelvis or hips. Electronically Signed   By: Markus Daft M.D.   On: 06/18/2022 13:14    Procedures Procedures    Medications  Ordered in ED Medications  HYDROcodone-acetaminophen (NORCO/VICODIN) 5-325 MG per tablet 1 tablet (1 tablet Oral Given 06/18/22 1319)    ED Course/ Medical Decision Making/ A&P                           Medical Decision Making Amount and/or Complexity of Data Reviewed Radiology: ordered.  Risk Prescription drug management.   61 y.o. female presents to the ED for concern of Hip Pain  This involves an extensive number of treatment options, and is a complaint that carries with it a high risk of complications and morbidity.  The emergent differential diagnosis prior to evaluation includes, but is not limited to: Fracture, dislocation, bursitis, strain, osteoarthritis  This is not an exhaustive differential.   Past Medical History / Co-morbidities / Social History: Hx of sciatica and prior lumbar fusion (L3-4, L4-5) by Dr. Arnoldo Morale of Neurosurgery 03/1819.  Also Hx of HTN, OSA, chronic back pain, DMT2, DDD cervical, depression, left atrial myxoma resection Social Determinants of Health include: Elderly  Additional History:  Obtained by chart review.  Notably sciatica rehab from 01/30/21.  Hx of chronic low back pain.  Hx of left sided sciatica.  Lab Tests: None  Imaging Studies: I ordered imaging studies including XR bilateral hips.   I independently visualized and interpreted imaging which showed no evidence of severe OA, fracture, or dislocation. I agree with the radiologist interpretation.  ED Course: Pt well-appearing on exam.  Presenting with 2 to 3 days of worsening acute bilateral hip pain radiating down both lateral thighs to the knees.  Right worse than left.  Positive SLR of the right, negative of left.  Remaining neuro exam unremarkable as described above.  Sensation, ROM's, and strength appears grossly intact of lower extremities.  Symptoms improve with swimming or rest, and worsened by activity.  Hx of prior lumbar fusion, however without midline tenderness or new back pain  complaints.  No new injury/trauma.  Chronic SI tenderness, hip pain is new per patient.  Without pinpoint tenderness of spine.  No hemoptysis, fevers, IVDU, or recent tx for malignancy.  No saddle anesthesia or urinary/bowel incontinence.  Low suspicion for cauda equina, discitis, or acute spinous infectious process.  With tenderness of bilateral hip bursa and IT bands, right worse than left, though without warmth, swelling, or pain out of proportion. Patient X-Ray negative for severe OA, fracture or dislocation.  Pain managed in ED.  Lidoderm patches applied.  Pt advised to follow up with her neurosurgeon and orthopedics for further evaluation and management.  Conservative therapy recommended and discussed.  Pt prefers to utilize tylenol for pain, which I find reasonable.  Patient in NAD and in good condition at time of discharge.  Disposition: After consideration the patient's encounter today, I do not feel today's workup suggests an emergent condition requiring admission or immediate intervention beyond what has been performed at this time.  Safe for discharge; instructed to return immediately for worsening symptoms, change in symptoms or any other concerns.  I have reviewed the patients home medicines and have made adjustments as needed.  Discussed course of treatment with the patient, whom demonstrated understanding.  Patient in agreement and has no further questions.     This chart was dictated using voice recognition software.  Despite best efforts to proofread, errors can occur which can change the documentation meaning.         Final Clinical Impression(s) / ED Diagnoses Final diagnoses:  Bilateral hip pain  Hypertension, unspecified type  History of lumbar spinal fusion  Bilateral low back pain with right-sided sciatica, unspecified chronicity    Rx / DC Orders ED Discharge Orders          Ordered    diclofenac Sodium (VOLTAREN) 1 % GEL  4 times daily        06/18/22 1335               Prince Rome, [place] 53/29/92 1118  Varney Biles, MD 06/20/22 [phone]

## 2022-06-18 NOTE — ED Triage Notes (Signed)
Pt c/o bilateral hip pain, 3-4 years, but increasing over past few weeks. Pt mobile.   AOx4

## 2022-06-20 ENCOUNTER — Ambulatory Visit: Payer: Self-pay | Admitting: Licensed Clinical Social Worker

## 2022-06-20 DIAGNOSIS — F32A Depression, unspecified: Secondary | ICD-10-CM

## 2022-06-21 NOTE — Patient Instructions (Signed)
Visit Information  Thank you for taking time to visit with me today. Please don't hesitate to contact me if I can be of assistance to you.   Following are the goals we discussed today:   Goals Addressed             This Visit's Progress    Obtain supportive resources Housing/Transportation/Pest Control   On track    Care Coordination Interventions: Solution-Focused Strategies employed:  Active listening / Reflection utilized  Emotional Support Provided Verbalization of feelings encouraged   Care guide referrals completed to assist with supportive resources to assist with transportation, pest control, and rent/utility assistance LCSW sent message to [facility], pt needs assistance managing health conditions Pt receives emotional support from spouse and sisters, who reside local. She cares for minor grandchildren        Our next appointment is by telephone on 07/05/22 at 1 PM  Please call the care guide team at [phone] if you need to cancel or reschedule your appointment.   If you are experiencing a Mental Health or [place] or need someone to talk to, please call the Suicide and Crisis Lifeline: 988 call 911   Patient verbalizes understanding of instructions and care plan provided today and agrees to view in [place]. Active MyChart status and patient understanding of how to access instructions and care plan via MyChart confirmed with patient.     [name], MSW, [email or web address] Phone [phone] 2:52 PM

## 2022-06-21 NOTE — Patient Outreach (Signed)
  Care Coordination   Initial Visit Note   06/21/2022 Name: Michele Wood MRN: [id number] DOB: Feb 27, 1961  Michele Wood is a 61 y.o. year old female who sees Michele Pier, MD for primary care. I spoke with  Michele Wood by phone today.  What matters to the patients health and wellness today?  Supportive resources regarding transportation, utility/rent assistance, and pest control    Goals Addressed             This Visit's Progress    Obtain supportive resources Housing/Transportation/Pest Control   On track    Care Coordination Interventions: Solution-Focused Strategies employed:  Active listening / Reflection utilized  Emotional Support Provided Verbalization of feelings encouraged   Care guide referrals completed to assist with supportive resources to assist with transportation, pest control, and rent/utility assistance LCSW sent message to [facility], pt needs assistance managing health conditions Pt receives emotional support from spouse and sisters, who reside local. She cares for minor grandchildren        SDOH assessments and interventions completed:  Yes  SDOH Interventions Today    Flowsheet Row Most Recent Value  SDOH Interventions   Housing Interventions Other (Comment)  [Care Guide referral]  Transportation Interventions Other (Comment)  [Care Guide referral]  Utilities Interventions Other (Comment)  [Care Guide referral]        Care Coordination Interventions Activated:  Yes  Care Coordination Interventions:  Yes, provided   Follow up plan: Follow up call scheduled for 2 weeks    Encounter Outcome:  Pt. Visit Completed   Michele Wood, MSW, [email or web address] [email or web address] Phone [phone] 2:50 PM

## 2022-06-25 ENCOUNTER — Telehealth: Payer: Self-pay

## 2022-06-25 NOTE — Telephone Encounter (Signed)
   Telephone encounter was:  Successful.  06/25/2022 Name: Michele Wood MRN: [id number] DOB: April 19, 1961  Michele Wood is a 61 y.o. year old female who is a primary care patient of Ladell Pier, MD . The community resource team was consulted for assistance with utility, rent, pest control  Care guide performed the following interventions: Spoke with patient about [name] emergency assistance program, [place] program, [employer] and [employer].Patient is aware of [employer] transportation.  Verified patient's email sent resource information and [place] application will be mailed to patient. [name] has my name and number if she does not receive email and mailed resources.  Follow Up Plan:  No further follow up planned at this time. The patient has been provided with needed resources.  [facility] Resource Care Guide   [email or web address]  ?? [phone]   Website: [email or web address]  [email or web address]  "We don't say no, we SHOW how!"         [facility]

## 2022-07-04 ENCOUNTER — Ambulatory Visit: Payer: Self-pay | Admitting: Licensed Clinical Social Worker

## 2022-07-05 ENCOUNTER — Encounter: Payer: Self-pay | Admitting: Licensed Clinical Social Worker

## 2022-07-09 DIAGNOSIS — H0288B Meibomian gland dysfunction left eye, upper and lower eyelids: Secondary | ICD-10-CM | POA: Diagnosis not present

## 2022-07-09 DIAGNOSIS — H04123 Dry eye syndrome of bilateral lacrimal glands: Secondary | ICD-10-CM | POA: Diagnosis not present

## 2022-07-09 DIAGNOSIS — E119 Type 2 diabetes mellitus without complications: Secondary | ICD-10-CM | POA: Diagnosis not present

## 2022-07-09 DIAGNOSIS — H52223 Regular astigmatism, bilateral: Secondary | ICD-10-CM | POA: Diagnosis not present

## 2022-07-09 DIAGNOSIS — H524 Presbyopia: Secondary | ICD-10-CM | POA: Diagnosis not present

## 2022-07-09 DIAGNOSIS — H02834 Dermatochalasis of left upper eyelid: Secondary | ICD-10-CM | POA: Diagnosis not present

## 2022-07-09 DIAGNOSIS — H5212 Myopia, left eye: Secondary | ICD-10-CM | POA: Diagnosis not present

## 2022-07-09 DIAGNOSIS — H35033 Hypertensive retinopathy, bilateral: Secondary | ICD-10-CM | POA: Diagnosis not present

## 2022-07-09 DIAGNOSIS — H02831 Dermatochalasis of right upper eyelid: Secondary | ICD-10-CM | POA: Diagnosis not present

## 2022-07-09 DIAGNOSIS — H2513 Age-related nuclear cataract, bilateral: Secondary | ICD-10-CM | POA: Diagnosis not present

## 2022-07-09 LAB — HM DIABETES EYE EXAM

## 2022-07-12 NOTE — Patient Instructions (Signed)
Visit Information  Thank you for taking time to visit with me today. Please don't hesitate to contact me if I can be of assistance to you.   Following are the goals we discussed today:   Goals Addressed             This Visit's Progress    Obtain supportive resources Housing/Transportation/Pest Control   On track    Care Coordination Interventions: Solution-Focused Strategies employed:  Active listening / Reflection utilized  Emotional Support Provided Verbalization of feelings encouraged   LCSW f/up with pt regarding utility assistance. Pt and spouse has a payment agreement with [employer] and denies additional financial assistance [place] applications mailed to residence, per request        Our next appointment is by telephone on 07/18/22 at 1 PM  Please call the care guide team at [phone] if you need to cancel or reschedule your appointment.   If you are experiencing a Mental Health or [place] or need someone to talk to, please call the Suicide and Crisis Lifeline: 988 call 911   Patient verbalizes understanding of instructions and care plan provided today and agrees to view in [place]. Active MyChart status and patient understanding of how to access instructions and care plan via MyChart confirmed with patient.     [name], MSW, [email or web address] Phone [phone] 12:23 PM

## 2022-07-12 NOTE — Patient Outreach (Signed)
  Care Coordination   Follow Up Visit Note   07/12/2022 Name: Michele Wood MRN: [id number] DOB: Dec 15, 1960  Michele Wood is a 61 y.o. year old female who sees Ladell Pier, MD for primary care. I spoke with  Michele Wood by phone today.  What matters to the patients health and wellness today?  Transportation through [place]             This Visit's Progress    Obtain supportive resources Housing/Transportation/Pest Control   On track    Care Coordination Interventions: Solution-Focused Strategies employed:  Active listening / Reflection utilized  Emotional Support Provided Verbalization of feelings encouraged   LCSW f/up with pt regarding utility assistance. Pt and spouse has a payment agreement with [employer] and denies additional financial assistance [place] applications mailed to residence, per request        SDOH assessments and interventions completed:  No     Care Coordination Interventions Activated:  Yes  Care Coordination Interventions:  Yes, provided   Follow up plan: Follow up call scheduled for 07/18/22    Encounter Outcome:  Pt. Visit Completed   [name], MSW, [email or web address] Phone [phone] 12:22 PM

## 2022-07-18 ENCOUNTER — Ambulatory Visit: Payer: Self-pay | Admitting: Licensed Clinical Social Worker

## 2022-07-23 NOTE — Patient Outreach (Signed)
  Care Coordination   Follow Up Visit Note   07/23/2022 Name: Michele Wood MRN: [id number] DOB: 06-12-1961  Michele Wood is a 61 y.o. year old female who sees Michele Pier, MD for primary care. I spoke with  Michele Wood by phone today.  What matters to the patients health and wellness today?  [place] Application and Utility Assistance Resources    Goals Addressed             This Visit's Progress    Obtain supportive resources Housing/Transportation/Pest Control   On track    Care Coordination Interventions: Solution-Focused Strategies employed:  Active listening / Reflection utilized  Emotional Support Provided Verbalization of feelings encouraged   Patient appreciative for utility assistance resources Patient received [place] applications. LCSW strongly encouraged pt to contact her with any questions or concerns. Once part A is completed, LCSW will assist with submitting Part B         SDOH assessments and interventions completed:  No     Care Coordination Interventions Activated:  Yes  Care Coordination Interventions:  Yes, provided   Follow up plan: Follow up call scheduled for 08/15/22    Encounter Outcome:  Pt. Visit Completed   Michele Wood, MSW, [email or web address] Phone [phone] 5:34 PM

## 2022-07-23 NOTE — Patient Instructions (Signed)
Visit Information  Thank you for taking time to visit with me today. Please don't hesitate to contact me if I can be of assistance to you.   Following are the goals we discussed today:   Goals Addressed             This Visit's Progress    Obtain supportive resources Housing/Transportation/Pest Control   On track    Care Coordination Interventions: Solution-Focused Strategies employed:  Active listening / Reflection utilized  Emotional Support Provided Verbalization of feelings encouraged   Patient appreciative for utility assistance resources Patient received [place] applications. LCSW strongly encouraged pt to contact her with any questions or concerns. Once part A is completed, LCSW will assist with submitting Part B         Our next appointment is by telephone on 08/15/22 at 1 PM  Please call the care guide team at [phone] if you need to cancel or reschedule your appointment.   If you are experiencing a Mental Health or [place] or need someone to talk to, please call the Suicide and Crisis Lifeline: 988 call 911   Patient verbalizes understanding of instructions and care plan provided today and agrees to view in [place]. Active MyChart status and patient understanding of how to access instructions and care plan via MyChart confirmed with patient.     [name], MSW, [email or web address] Phone [phone] 5:35 PM

## 2022-08-15 ENCOUNTER — Ambulatory Visit: Payer: Self-pay | Admitting: Licensed Clinical Social Worker

## 2022-08-15 NOTE — Patient Outreach (Signed)
  Care Coordination   Follow Up Visit Note   08/15/2022 Name: GUSTAVO MEDITZ MRN: [id number] DOB: 10-19-1960  DAPHNEE PREISS is a 61 y.o. year old female who sees Ladell Pier, MD for primary care. I spoke with  [name] by phone today.  What matters to the patients health and wellness today?  Transportation    Goals Addressed             This Visit's Progress    COMPLETED: Obtain supportive resources Housing/Transportation/Pest Control       Care Coordination Interventions: Solution-Focused Strategies employed:  Active listening / Reflection utilized  Emotional Support Provided Verbalization of feelings encouraged   Patient shared she has no questions or concerns regarding provided transportation resources LCSW inquired about any additional supportive resources, which patient denied having LCSW reviewed upcoming appts         SDOH assessments and interventions completed:  No     Care Coordination Interventions Activated:  Yes  Care Coordination Interventions:  Yes, provided   Follow up plan: No further intervention required.   Encounter Outcome:  Pt. Visit Completed   [name], MSW, [email or web address] Phone [phone] 4:15 PM

## 2022-08-15 NOTE — Patient Instructions (Signed)
Visit Information  Thank you for taking time to visit with me today. Please don't hesitate to contact me if I can be of assistance to you.   Following are the goals we discussed today:   Goals Addressed             This Visit's Progress    COMPLETED: Obtain supportive resources Housing/Transportation/Pest Control       Care Coordination Interventions: Solution-Focused Strategies employed:  Active listening / Reflection utilized  Emotional Support Provided Verbalization of feelings encouraged   Patient shared she has no questions or concerns regarding provided transportation resources LCSW inquired about any additional supportive resources, which patient denied having LCSW reviewed upcoming appts        If you are experiencing a Mental Health or [place] or need someone to talk to, please call the Suicide and Crisis Lifeline: 988 call 911   Patient verbalizes understanding of instructions and care plan provided today and agrees to view in [place]. Active MyChart status and patient understanding of how to access instructions and care plan via MyChart confirmed with patient.     No further follow up required:    [name], MSW, [email or web address] Phone [phone] 4:15 PM

## 2022-09-10 ENCOUNTER — Ambulatory Visit: Payer: 59 | Attending: Internal Medicine | Admitting: Internal Medicine

## 2022-09-10 ENCOUNTER — Encounter: Payer: Self-pay | Admitting: Internal Medicine

## 2022-09-10 DIAGNOSIS — M25552 Pain in left hip: Secondary | ICD-10-CM

## 2022-09-10 DIAGNOSIS — E1159 Type 2 diabetes mellitus with other circulatory complications: Secondary | ICD-10-CM

## 2022-09-10 DIAGNOSIS — M25551 Pain in right hip: Secondary | ICD-10-CM

## 2022-09-10 DIAGNOSIS — E1169 Type 2 diabetes mellitus with other specified complication: Secondary | ICD-10-CM

## 2022-09-10 DIAGNOSIS — I152 Hypertension secondary to endocrine disorders: Secondary | ICD-10-CM

## 2022-09-10 DIAGNOSIS — E785 Hyperlipidemia, unspecified: Secondary | ICD-10-CM

## 2022-09-10 LAB — POCT GLYCOSYLATED HEMOGLOBIN (HGB A1C): HbA1c, POC (controlled diabetic range): 6 % (ref 0.0–7.0)

## 2022-09-10 MED ORDER — VALSARTAN 80 MG PO TABS: 80.0000 mg | ORAL_TABLET | Freq: Every day | ORAL | 6 refills | Status: DC

## 2022-09-10 NOTE — Patient Instructions (Signed)
Your blood pressure is elevated.  We increased Valsartan to 80 mg daily. Please return to the lab after you have been on the increased dose for 2 weeks for kidney function check.  I have referred you to orthopedics for the left hip.

## 2022-09-10 NOTE — Progress Notes (Signed)
Patient ID: Michele Wood, female    DOB: 18-Aug-1961  MRN: [id number]  CC: Diabetes (Dm f/u. Med refills./Pain in L hip, unable to sleep, pain radiates to legs, "warm to the touch" X6 mo. Herminio Heads received flu vax at pharmacy. )   Subjective: Michele Wood is a 61 y.o. female who presents for chronic ds management Her concerns today include:  Hx of HTN, LT atrial myxoma (s/p minimally invasive surgery 05/2019), minimal CAD on cardiac CT, chronic sinusitis, degen disc of c-spine, chronic LBP, vit D def, obesity, anx/dep,  DM (with ? neuropathy in feet from DM vs lower back), dep with psychosis.   DM/obesity: A1c 6.0 Taking Metformin 500 mg BID Not checking BS.  Misplaced machine Doing well with eating habits. She was still going to the gym with her sisters but has not gone in the past 2 weeks.  When she goes to the gym, she sometimes swim, other times she uses the stationary bike.  She discontinued doing any weights so as not to injure her back which I told her is a good idea.  She is down 8 pounds since April of this year.  HTN:   Compliant with meds-Norvasc 10 mg daily, Diovan 40 mg daily, HCTZ 25 mg daily and metoprolol 50 mg twice a day.  No chest pains or shortness of breath. No device to check BP Limits salt in foods  HL:  taking Lipitor 40 mg daily.  Last LDL was 100 in February of this year  Pain in LT hip x 6 mths.  She points to the area over the trochanteric bursa.  Right hip hurt at times but not as bad as the left.  No known injury. More noticeable at nights when sitting in chair, getting up from chair and laying on it.  Also feels when lays on back.   No numbness or tingling.  Sometimes radiates to knee laterally Had spine fusion in 2020.  HM: Reports having had flu shot at  [facility] in [place] last month. Patient Active Problem List   Diagnosis Date Noted   Hyperlipidemia associated with type 2 diabetes mellitus ([place]) 02/28/2020   Hypotension  07/19/2019   CAD (coronary artery disease) 06/23/2019   S/P minimally-invasive resection of left atrial myxoma 06/08/2019   Pancreatic cyst 05/31/2019   Spondylolisthesis of lumbar region 03/31/2019   Atrial myxoma 03/19/2019   Dyslipidemia, goal LDL below 70 12/16/2018   Family history of heart disease 12/16/2018   Left bundle branch block 12/16/2018   History of chest pain 12/16/2018   MDD (major depressive disorder), recurrent episode, moderate ([place]) 10/19/2018   Non-insulin treated type 2 diabetes mellitus ([place]) 12/12/2017   Morbid obesity ([place]) 09/12/2017   Anxiety and depression 06/13/2017   Cervical radiculopathy due to intervertebral disc disorder 06/13/2017   Chronic low back pain without sciatica 09/18/2015   DJD (degenerative joint disease), lumbar 06/08/2015   Vitamin D insufficiency 04/25/2014   HTN (hypertension) 06/21/2008     Current Outpatient Medications on File Prior to Visit  Medication Sig Dispense Refill   amLODipine (NORVASC) 10 MG tablet TAKE 1 TABLET BY MOUTH EVERY DAY 90 tablet 1   atorvastatin (LIPITOR) 40 MG tablet Take 1 tablet (40 mg total) by mouth daily. 90 tablet 3   Cholecalciferol (VITAMIN D3) 2000 UNITS TABS Take 2,000 Units by mouth daily. (Patient taking differently: Take 2,000 Units by mouth 2 (two) times a week.) 30 tablet 11   diclofenac Sodium (VOLTAREN) 1 %  GEL Apply 2 g topically 4 (four) times daily. 100 g 0   DULoxetine (CYMBALTA) 60 MG capsule TAKE 1 CAPSULE BY MOUTH EVERY DAY 90 capsule 1   gabapentin (NEURONTIN) 300 MG capsule Take 1 capsule (300 mg total) by mouth at bedtime. For nerve pain 90 capsule 1   glucose blood (CONTOUR NEXT TEST) test strip Use as instructed 100 each 12   hydrochlorothiazide (HYDRODIURIL) 25 MG tablet Take 1 tablet (25 mg total) by mouth daily. 90 tablet 3   hydrOXYzine (VISTARIL) 25 MG capsule TAKE 1 CAPSULE (25 MG TOTAL) BY MOUTH AT BEDTIME AS NEEDED FOR ANXIETY. 90 capsule 1   Lurasidone HCl 60 MG TABS Take  1 tablet (60 mg total) by mouth daily with breakfast. 30 tablet 2   metFORMIN (GLUCOPHAGE) 500 MG tablet PLEASE SEE ATTACHED FOR DETAILED DIRECTIONS 180 tablet 1   metoprolol tartrate (LOPRESSOR) 50 MG tablet TAKE 1 TABLET BY MOUTH TWICE A DAY 180 tablet 1   montelukast (SINGULAIR) 10 MG tablet TAKE 1 TABLET BY MOUTH EVERY DAY 90 tablet 1   acetaminophen (TYLENOL 8 HOUR) 650 MG CR tablet Take 1 tablet (650 mg total) by mouth every 8 (eight) hours as needed for pain or fever. (Patient not taking: Reported on 09/10/2022) 30 tablet 0   aspirin (ASPIRIN LOW DOSE) 81 MG EC tablet Take 1 tablet (81 mg total) by mouth daily. Swallow whole. (Patient not taking: Reported on 09/10/2022) 90 tablet 1   No current facility-administered medications on file prior to visit.    Allergies  Allergen Reactions   Codeine Sulfate Rash   Milk-Related Compounds Diarrhea and Other (See Comments)    Diarrhea and stomach upset    Social History   Socioeconomic History   Marital status: Married    Spouse name: John   Number of children: 2   Years of education: Not on file   Highest education level: Some college, no degree  Occupational History    Comment: home maker  Tobacco Use   Smoking status: Never   Smokeless tobacco: Never  Vaping Use   Vaping Use: Never used  Substance and Sexual Activity   Alcohol use: Yes    Alcohol/week: 0.0 standard drinks of alcohol    Comment: rare   Drug use: No   Sexual activity: Not Currently  Other Topics Concern   Not on file  Social History Narrative   Lives with husband   Social Determinants of Health   Financial Resource Strain: High Risk (01/21/2018)   Overall Financial Resource Strain (CARDIA)    Difficulty of Paying Living Expenses: Very hard  Food Insecurity: Food Insecurity Present (01/21/2018)   Hunger Vital Sign    Worried About Running Out of Food in the Last Year: Often true    Ran Out of Food in the Last Year: Often true  Transportation Needs:  Unmet Transportation Needs (06/25/2022)   PRAPARE - Transportation    Lack of Transportation (Medical): Yes    Lack of Transportation (Non-Medical): Yes  Physical Activity: Sufficiently Active (01/21/2018)   Exercise Vital Sign    Days of Exercise per Week: 7 days    Minutes of Exercise per Session: 60 min  Stress: Stress Concern Present (01/21/2018)   [place]    Feeling of Stress : Very much  Social Connections: Unknown (01/21/2018)   Social Connection and Isolation Panel [NHANES]    Frequency of Communication with Friends and Family: Three times a  week    Frequency of Social Gatherings with Friends and Family: Not on file    Attends Religious Services: Not on file    Active Member of Clubs or Organizations: Not on file    Attends Archivist Meetings: Not on file    Marital Status: Not on file  Intimate Partner Violence: Not At Risk (01/21/2018)   Humiliation, Afraid, Rape, and Kick questionnaire    Fear of Current or Ex-Partner: No    Emotionally Abused: No    Physically Abused: No    Sexually Abused: No    Family History  Problem Relation Age of Onset   Hypertension Mother    Schizophrenia Brother    Hypertension Other     Past Surgical History:  Procedure Laterality Date   ABDOMINAL HYSTERECTOMY     BACK SURGERY  2020   lower back   BREAST BIOPSY Right 07/02/2006   [place] Core benign   EXPLORATION POST OPERATIVE OPEN HEART  2020   MINIMALLY INVASIVE EXCISION OF ATRIAL MYXOMA N/A 06/08/2019   Procedure: MINIMALLY INVASIVE RESECTION OF LEFT ATRIAL MYXOMA;  Surgeon: [name], MD;  Location: [facility];  Service: Open Heart Surgery;  Laterality: N/A;   NASAL SINUS SURGERY     TEE WITHOUT CARDIOVERSION N/A 05/17/2019   Procedure: TRANSESOPHAGEAL ECHOCARDIOGRAM (TEE);  Surgeon: Acie Fredrickson Wonda Cheng, MD;  Location: [facility];  Service: Cardiovascular;  Laterality: N/A;   TEE WITHOUT CARDIOVERSION N/A  06/08/2019   Procedure: TRANSESOPHAGEAL ECHOCARDIOGRAM (TEE);  Surgeon: [name], MD;  Location: [facility];  Service: Open Heart Surgery;  Laterality: N/A;    ROS: Review of Systems Negative except as stated above  PHYSICAL EXAM: BP (!) 137/93 (BP Location: Left Arm, Patient Position: Sitting, Cuff Size: Large)   Pulse 82   Temp 97.8 F (36.6 C) (Oral)   Ht '5\' 2"'$  (1.575 m)   Wt 242 lb (109.8 kg)   LMP 09/01/2013   SpO2 98%   BMI 44.26 kg/m   Wt Readings from Last 3 Encounters:  09/10/22 242 lb (109.8 kg)  06/18/22 244 lb 11.4 oz (111 kg)  06/10/22 246 lb 3.2 oz (111.7 kg)    Physical Exam  General appearance - alert, well appearing, and in no distress.  She ambulates with a cane. Mental status - normal mood, behavior, speech, dress, motor activity, and thought processes Neck - supple, no significant adenopathy Chest - clear to auscultation, no wheezes, rales or rhonchi, symmetric air entry Heart - normal rate, regular rhythm, normal S1, S2, no murmurs, rubs, clicks or gallops Extremities - peripheral pulses normal, no pedal edema, no clubbing or cyanosis MSK: Straight leg raise negative on both sides.  She has good range of motion of both hips.  Mild to moderate tenderness on palpation of the the left trochanteric bursa.  Patient had x-ray of the hips done 06/18/2022 that showed no acute bone abnormality in the pelvis or hips.  No significant joint space narrowing seen.     Latest Ref Rng & Units 12/05/2021   10:25 AM 08/18/2020   11:54 AM 11/05/2019   10:03 AM  CMP  Glucose 70 - 99 mg/dL 85  76  92   BUN 6 - 24 mg/dL '13  9  14   '$ Creatinine 0.57 - 1.00 mg/dL 0.84  0.86  0.86   Sodium 134 - 144 mmol/L 141  138  145   Potassium 3.5 - 5.2 mmol/L 4.2  4.5  3.7   Chloride  96 - 106 mmol/L 105  100  103   CO2 20 - 29 mmol/L '23  27  26   '$ Calcium 8.7 - 10.2 mg/dL 9.9  9.3  9.9   Total Protein 6.0 - 8.5 g/dL 7.3  7.5    Total Bilirubin 0.0 - 1.2 mg/dL 0.4  0.4    Alkaline  Phos 44 - 121 IU/L 133  108    AST 0 - 40 IU/L 16  18    ALT 0 - 32 IU/L 17  18     Lipid Panel     Component Value Date/Time   CHOL 162 12/05/2021 1025   TRIG 108 12/05/2021 1025   HDL 42 12/05/2021 1025   CHOLHDL 3.9 12/05/2021 1025   CHOLHDL 6.1 10/26/2013 0949   VLDL 36 10/26/2013 0949   LDLCALC 100 (H) 12/05/2021 1025    CBC    Component Value Date/Time   WBC 6.0 12/05/2021 1025   WBC 10.8 (H) 06/27/2019 0040   RBC 4.99 12/05/2021 1025   RBC 4.15 06/27/2019 0040   HGB 12.1 12/05/2021 1025   HCT 37.4 12/05/2021 1025   PLT 319 12/05/2021 1025   MCV 75 (L) 12/05/2021 1025   MCH 24.2 (L) 12/05/2021 1025   MCH 23.9 (L) 06/27/2019 0040   MCHC 32.4 12/05/2021 1025   MCHC 30.0 06/27/2019 0040   RDW 13.9 12/05/2021 1025   LYMPHSABS 2.1 12/05/2021 1025   MONOABS 0.5 12/03/2013 1508   EOSABS 0.1 12/05/2021 1025   BASOSABS 0.0 12/05/2021 1025    ASSESSMENT AND PLAN: 1. Type 2 diabetes mellitus with morbid obesity (HCC) At goal Continue metformin 500 mg twice a day. Encouraged her to continue healthy eating habits and regular exercise.  Commended her on weight loss so far. - POCT glycosylated hemoglobin (Hb A1C)  2. Hypertension associated with diabetes ([place]) Not at goal.  Increase Diovan to 80 mg daily.  Continue Norvasc 10 mg, HCTZ 25 mg daily and metoprolol 50 mg twice a day.  Return to the lab in 2 weeks for chemistry after being on the Diovan 80 mg for that period of time - Basic Metabolic Panel; Future - valsartan (DIOVAN) 80 MG tablet; Take 1 tablet (80 mg total) by mouth daily. For blood pressure  Dispense: 30 tablet; Refill: 6  3. Hyperlipidemia associated with type 2 diabetes mellitus (HCC) Continue atorvastatin 40 mg daily  4. Hip pain, bilateral Suggest trochanteric bursitis.  Referral placed to orthopedics.  Avoid NSAIDs as this would likely increase blood pressure.  Recommend extra strength Tylenol 3 times a day as needed. - Ambulatory referral to  Orthopedic Surgery      Patient was given the opportunity to ask questions.  Patient verbalized understanding of the plan and was able to repeat key elements of the plan.   This documentation was completed using Radio producer.  Any transcriptional errors are unintentional.  Orders Placed This Encounter  Procedures   Basic Metabolic Panel   Ambulatory referral to Orthopedic Surgery   POCT glycosylated hemoglobin (Hb A1C)     Requested Prescriptions   Signed Prescriptions Disp Refills   valsartan (DIOVAN) 80 MG tablet 30 tablet 6    Sig: Take 1 tablet (80 mg total) by mouth daily. For blood pressure    Return in about 4 months (around 01/09/2023).  Karle Plumber, MD, FACP

## 2022-09-16 ENCOUNTER — Ambulatory Visit (INDEPENDENT_AMBULATORY_CARE_PROVIDER_SITE_OTHER): Payer: 59 | Admitting: Physician Assistant

## 2022-09-16 ENCOUNTER — Encounter: Payer: Self-pay | Admitting: Physician Assistant

## 2022-09-16 DIAGNOSIS — M7061 Trochanteric bursitis, right hip: Secondary | ICD-10-CM

## 2022-09-16 MED ORDER — LIDOCAINE HCL 1 % IJ SOLN
3.0000 mL | INTRAMUSCULAR | Status: AC | PRN
  Administered 2022-09-16: 3 mL

## 2022-09-16 MED ORDER — METHYLPREDNISOLONE ACETATE 40 MG/ML IJ SUSP
40.0000 mg | INTRAMUSCULAR | Status: AC | PRN
  Administered 2022-09-16: 40 mg via INTRA_ARTICULAR

## 2022-09-16 NOTE — Progress Notes (Signed)
HPI: Michele Wood comes in today with bilateral hip pain.  Right greater than left.  Pain is lateral aspect of the hips.  She does note some groin pain at times.  Pains been ongoing for the past 3 months no injury.  Pain after sitting for prolonged period of time or trying to lie on the hip at night.  She is stiff in the morning whenever she first gets up she has tried ibuprofen which she states helps but does not take away all of her pain.  She is diabetic last hemoglobin A1c was 6.0.  Review of systems: Denies any fevers or chills  Physical exam: General no acute distress mood affect appropriate Bilateral hips excellent range of motion of both hips.  Right hip discomfort with external rotation.  Both hips no pain with internal rotation.  Tenderness right hip greater in the left hip with palpation over the trochanteric region.    Procedure Note  Patient: Michele Wood             Date of Birth: December 18, 1960           MRN: [id number]             Visit Date: 09/16/2022  Procedures: Visit Diagnoses: No diagnosis found.  Large Joint Inj: R greater trochanter on 09/16/2022 5:13 PM Indications: pain Details: 22 G 1.5 in needle, lateral approach  Arthrogram: No  Medications: 3 mL lidocaine 1 %; 40 mg methylPREDNISolone acetate 40 MG/ML Outcome: tolerated well, no immediate complications Procedure, treatment alternatives, risks and benefits explained, specific risks discussed. Consent was given by the patient. Immediately prior to procedure a time out was called to verify the correct patient, procedure, equipment, support staff and site/side marked as required. Patient was prepped and draped in the usual sterile fashion.     Plan: Recommend IT band stretching exercises as shown.  She will follow-up with [place] if pain persist or becomes worse.  Did speak with her about her diabetes and she needs to watch glucose levels closely over the next 24 to 48 hours.  Questions were encouraged and  answered at length.

## 2022-11-02 ENCOUNTER — Other Ambulatory Visit: Payer: Self-pay | Admitting: Internal Medicine

## 2022-11-02 DIAGNOSIS — E1169 Type 2 diabetes mellitus with other specified complication: Secondary | ICD-10-CM

## 2022-11-04 NOTE — Telephone Encounter (Signed)
Unable to refill per protocol, Rx request is too soon. Last refill 06/18/22 for 90 and 1 refill, will refuse.  Requested Prescriptions  Pending Prescriptions Disp Refills   metFORMIN (GLUCOPHAGE) 500 MG tablet [Pharmacy Med Name: METFORMIN HCL 500 MG TABLET] 270 tablet 2    Sig: PLEASE SEE [place] DIRECTIONS     Endocrinology:  Diabetes - Biguanides Failed - 11/02/2022  2:32 PM      Failed - B12 Level in normal range and within 720 days    No results found for: "VITAMINB12"       Passed - Cr in normal range and within 360 days    Creat  Date Value Ref Range Status  04/25/2015 0.85 0.50 - 1.10 mg/dL Final   Creatinine, Ser  Date Value Ref Range Status  12/05/2021 0.84 0.57 - 1.00 mg/dL Final         Passed - HBA1C is between 0 and 7.9 and within 180 days    HbA1c, POC (prediabetic range)  Date Value Ref Range Status  11/01/2019 5.7 5.7 - 6.4 % Final   HbA1c, POC (controlled diabetic range)  Date Value Ref Range Status  09/10/2022 6.0 0.0 - 7.0 % Final         Passed - eGFR in normal range and within 360 days    GFR, Est African American  Date Value Ref Range Status  04/25/2015 >89 mL/min Final   GFR calc Af Amer  Date Value Ref Range Status  08/18/2020 86 >59 mL/min/1.73 Final    Comment:    **In accordance with recommendations from the NKF-ASN Task force,**   [facility] is in the process of updating its eGFR calculation to the   2021 CKD-EPI creatinine equation that estimates kidney function   without a race variable.    GFR, Est Non African American  Date Value Ref Range Status  04/25/2015 79 mL/min Final    Comment:      The estimated GFR is a calculation valid for adults (>=31 years old) that uses the CKD-EPI algorithm to adjust for age and sex. It is   not to be used for children, pregnant women, hospitalized patients,    patients on dialysis, or with rapidly changing kidney function. According to the NKDEP, eGFR >89 is normal, 60-89 shows  mild impairment, 30-59 shows moderate impairment, 15-29 shows severe impairment and <15 is ESRD.      GFR calc non Af Amer  Date Value Ref Range Status  08/18/2020 75 >59 mL/min/1.73 Final   eGFR  Date Value Ref Range Status  12/05/2021 80 >59 mL/min/1.73 Final         Passed - Valid encounter within last 6 months    Recent Outpatient Visits           1 month ago Type 2 diabetes mellitus with morbid obesity ([place])   [place] Karle Plumber B, MD   6 months ago Type 2 diabetes mellitus with morbid obesity [facility])   [place] Ladell Pier, MD   10 months ago Essential hypertension   [place], Stephen L, RPH-CPP   11 months ago Type 2 diabetes mellitus with morbid obesity [facility])   [place], [place], [place]   1 year ago Encounter for [employer] annual wellness exam   [place],  MD       Future Appointments             In 2 months Ladell Pier, MD [place]            Passed - CBC within normal limits and completed in the last 12 months    WBC  Date Value Ref Range Status  12/05/2021 6.0 3.4 - 10.8 x10E3/uL Final  06/27/2019 10.8 (H) 4.0 - 10.5 K/uL Final   RBC  Date Value Ref Range Status  12/05/2021 4.99 3.77 - 5.28 x10E6/uL Final  06/27/2019 4.15 3.87 - 5.11 MIL/uL Final   Hemoglobin  Date Value Ref Range Status  12/05/2021 12.1 11.1 - 15.9 g/dL Final   Hematocrit  Date Value Ref Range Status  12/05/2021 37.4 34.0 - 46.6 % Final   MCHC  Date Value Ref Range Status  12/05/2021 32.4 31.5 - 35.7 g/dL Final  06/27/2019 30.0 30.0 - 36.0 g/dL Final   [facility]  Date Value Ref Range Status  12/05/2021 24.2 (L) 26.6 - 33.0 pg Final  06/27/2019 23.9 (L) 26.0 - 34.0 pg Final   MCV  Date Value Ref  Range Status  12/05/2021 75 (L) 79 - 97 fL Final   No results found for: "PLTCOUNTKUC", "LABPLAT", "POCPLA" RDW  Date Value Ref Range Status  12/05/2021 13.9 11.7 - 15.4 % Final

## 2022-11-06 ENCOUNTER — Other Ambulatory Visit: Payer: Self-pay | Admitting: Internal Medicine

## 2022-11-06 NOTE — Telephone Encounter (Signed)
Requested Prescriptions  Pending Prescriptions Disp Refills   metFORMIN (GLUCOPHAGE) 500 MG tablet [Pharmacy Med Name: METFORMIN HCL 500 MG TABLET] 180 tablet 0    Sig: PLEASE SEE ATTACHED FOR DETAILED DIRECTIONS     Endocrinology:  Diabetes - Biguanides Failed - 11/06/2022  1:31 AM      Failed - B12 Level in normal range and within 720 days    No results found for: "VITAMINB12"       Passed - Cr in normal range and within 360 days    Creat  Date Value Ref Range Status  04/25/2015 0.85 0.50 - 1.10 mg/dL Final   Creatinine, Ser  Date Value Ref Range Status  12/05/2021 0.84 0.57 - 1.00 mg/dL Final         Passed - HBA1C is between 0 and 7.9 and within 180 days    HbA1c, POC (prediabetic range)  Date Value Ref Range Status  11/01/2019 5.7 5.7 - 6.4 % Final   HbA1c, POC (controlled diabetic range)  Date Value Ref Range Status  09/10/2022 6.0 0.0 - 7.0 % Final         Passed - eGFR in normal range and within 360 days    GFR, Est African American  Date Value Ref Range Status  04/25/2015 >89 mL/min Final   GFR calc Af Amer  Date Value Ref Range Status  08/18/2020 86 >59 mL/min/1.73 Final    Comment:    **In accordance with recommendations from the NKF-ASN Task force,**   [facility] is in the process of updating its eGFR calculation to the   2021 CKD-EPI creatinine equation that estimates kidney function   without a race variable.    GFR, Est Non African American  Date Value Ref Range Status  04/25/2015 79 mL/min Final    Comment:      The estimated GFR is a calculation valid for adults (>=53 years old) that uses the CKD-EPI algorithm to adjust for age and sex. It is   not to be used for children, pregnant women, hospitalized patients,    patients on dialysis, or with rapidly changing kidney function. According to the NKDEP, eGFR >89 is normal, 60-89 shows mild impairment, 30-59 shows moderate impairment, 15-29 shows severe impairment and <15 is ESRD.      GFR calc non  Af Amer  Date Value Ref Range Status  08/18/2020 75 >59 mL/min/1.73 Final   eGFR  Date Value Ref Range Status  12/05/2021 80 >59 mL/min/1.73 Final         Passed - Valid encounter within last 6 months    Recent Outpatient Visits           1 month ago Type 2 diabetes mellitus with morbid obesity ([place])   [place] Karle Plumber B, MD   6 months ago Type 2 diabetes mellitus with morbid obesity [facility])   [place] Ladell Pier, MD   10 months ago Essential hypertension   [place], Stephen L, RPH-CPP   11 months ago Type 2 diabetes mellitus with morbid obesity [facility])   [place], [place], [place]   1 year ago Encounter for [employer] annual wellness exam   [place], MD       Future Appointments  In 2 months Ladell Pier, MD [place]            Passed - CBC within normal limits and completed in the last 12 months    WBC  Date Value Ref Range Status  12/05/2021 6.0 3.4 - 10.8 x10E3/uL Final  06/27/2019 10.8 (H) 4.0 - 10.5 K/uL Final   RBC  Date Value Ref Range Status  12/05/2021 4.99 3.77 - 5.28 x10E6/uL Final  06/27/2019 4.15 3.87 - 5.11 MIL/uL Final   Hemoglobin  Date Value Ref Range Status  12/05/2021 12.1 11.1 - 15.9 g/dL Final   Hematocrit  Date Value Ref Range Status  12/05/2021 37.4 34.0 - 46.6 % Final   MCHC  Date Value Ref Range Status  12/05/2021 32.4 31.5 - 35.7 g/dL Final  06/27/2019 30.0 30.0 - 36.0 g/dL Final   [facility]  Date Value Ref Range Status  12/05/2021 24.2 (L) 26.6 - 33.0 pg Final  06/27/2019 23.9 (L) 26.0 - 34.0 pg Final   MCV  Date Value Ref Range Status  12/05/2021 75 (L) 79 - 97 fL Final   No results found for: "PLTCOUNTKUC", "LABPLAT", "POCPLA" RDW  Date  Value Ref Range Status  12/05/2021 13.9 11.7 - 15.4 % Final

## 2022-12-11 ENCOUNTER — Other Ambulatory Visit: Payer: Self-pay | Admitting: Physician Assistant

## 2022-12-11 ENCOUNTER — Other Ambulatory Visit: Payer: Self-pay | Admitting: Internal Medicine

## 2022-12-11 DIAGNOSIS — F4310 Post-traumatic stress disorder, unspecified: Secondary | ICD-10-CM

## 2022-12-11 DIAGNOSIS — J324 Chronic pansinusitis: Secondary | ICD-10-CM

## 2022-12-11 DIAGNOSIS — I10 Essential (primary) hypertension: Secondary | ICD-10-CM

## 2022-12-11 DIAGNOSIS — E1169 Type 2 diabetes mellitus with other specified complication: Secondary | ICD-10-CM

## 2022-12-11 DIAGNOSIS — F331 Major depressive disorder, recurrent, moderate: Secondary | ICD-10-CM

## 2022-12-11 NOTE — Telephone Encounter (Signed)
Requested Prescriptions  Pending Prescriptions Disp Refills   atorvastatin (LIPITOR) 40 MG tablet [Pharmacy Med Name: ATORVASTATIN 40 MG TABLET] 90 tablet 0    Sig: TAKE 1 TABLET BY MOUTH EVERY DAY     Cardiovascular:  Antilipid - Statins Failed - 12/11/2022  2:41 AM      Failed - Lipid Panel in normal range within the last 12 months    Cholesterol, Total  Date Value Ref Range Status  12/05/2021 162 100 - 199 mg/dL Final   LDL Chol Calc (NIH)  Date Value Ref Range Status  12/05/2021 100 (H) 0 - 99 mg/dL Final   HDL  Date Value Ref Range Status  12/05/2021 42 >39 mg/dL Final   Triglycerides  Date Value Ref Range Status  12/05/2021 108 0 - 149 mg/dL Final         Passed - Patient is not pregnant      Passed - Valid encounter within last 12 months    Recent Outpatient Visits           3 months ago Type 2 diabetes mellitus with morbid obesity ([place])   [place] Karle Plumber B, MD   7 months ago Type 2 diabetes mellitus with morbid obesity [facility])   [place], MD   11 months ago Essential hypertension   [place], RPH-CPP   1 year ago Type 2 diabetes mellitus with morbid obesity [facility])   [place], [place], [place]   1 year ago Encounter for [employer] annual wellness exam   [place], MD       Future Appointments             In 1 month Wynetta Emery, Dalbert Batman, MD [place]

## 2022-12-19 ENCOUNTER — Encounter: Payer: Self-pay | Admitting: Radiology

## 2023-01-10 ENCOUNTER — Ambulatory Visit: Payer: 59 | Attending: Internal Medicine | Admitting: Internal Medicine

## 2023-01-10 ENCOUNTER — Encounter: Payer: Self-pay | Admitting: Internal Medicine

## 2023-01-10 DIAGNOSIS — I10 Essential (primary) hypertension: Secondary | ICD-10-CM | POA: Diagnosis not present

## 2023-01-10 DIAGNOSIS — I152 Hypertension secondary to endocrine disorders: Secondary | ICD-10-CM

## 2023-01-10 DIAGNOSIS — E1169 Type 2 diabetes mellitus with other specified complication: Secondary | ICD-10-CM | POA: Diagnosis not present

## 2023-01-10 DIAGNOSIS — E1159 Type 2 diabetes mellitus with other circulatory complications: Secondary | ICD-10-CM | POA: Diagnosis not present

## 2023-01-10 DIAGNOSIS — E785 Hyperlipidemia, unspecified: Secondary | ICD-10-CM

## 2023-01-10 MED ORDER — METFORMIN HCL 500 MG PO TABS: ORAL_TABLET | ORAL | 1 refills | Status: DC

## 2023-01-10 MED ORDER — ACCU-CHEK GUIDE VI STRP: ORAL_STRIP | 12 refills | Status: AC

## 2023-01-10 MED ORDER — ACCU-CHEK GUIDE W/DEVICE KIT: PACK | 0 refills | Status: AC

## 2023-01-10 MED ORDER — HYDROCHLOROTHIAZIDE 25 MG PO TABS: 25.0000 mg | ORAL_TABLET | Freq: Every day | ORAL | 3 refills | Status: DC

## 2023-01-10 MED ORDER — ACCU-CHEK SOFTCLIX LANCETS MISC: 12 refills | Status: AC

## 2023-01-10 MED ORDER — ATORVASTATIN CALCIUM 40 MG PO TABS: 40.0000 mg | ORAL_TABLET | Freq: Every day | ORAL | 1 refills | Status: DC

## 2023-01-10 NOTE — Progress Notes (Signed)
Patient ID: Michele Wood, female   DOB: 1961/01/05, 62 y.o.   MRN: [id number] Virtual Visit via Telephone Note This was supposed to be a video visit.  We connected via video but patient could not figure out how to turn on her microphone so we converted to a telephone visit. I connected with [name] on 01/10/2023 at 11:16 a.m by telephone and verified that I am speaking with the correct person using two identifiers  Location: Patient: home Provider: office  Participants: Myself Patient   I discussed the limitations, risks, security and privacy concerns of performing an evaluation and management service by telephone and the availability of in person appointments. I also discussed with the patient that there may be a patient responsible charge related to this service. The patient expressed understanding and agreed to proceed.   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 neuropathy in feet from DM vs lower back), dep with psychosis. This is f/u for chronic ds management.  DM/Obesity: Lab Results  Component Value Date   HGBA1C 6.0 09/10/2022  Still going to gym with sisters almost daily to swim and ride bike. Doing well with eating habits.  Eating more veggies and more lean meat. Drinks mainly water. Compliant with Metformin 500 mg BID.  No device check blood sugar.  Would like to have the device to do so. Has not weigh self recently.  Thinks she gained some over the christmas holiday because "everybody wanted to give me food."   HTN:  reports BP has been running a bit high.  One morning it was 140/100.  Did not get to take her meds that morning as she was rushing to get out the door Checks BP once a wk.  Range has been 140s/90s -reports compliance with  Norvasc 10 mg daily, Diovan 80 mg daily, HCTZ 25  mg daily and metoprolol 50 mg twice a day.  -she was suppose to come to lab 1 week after last visit to have chemistry done because we had increased the dose of the Diovan from 40 mg daily to 80 mg daily.  Patient states she forgot to return to the lab but will do so next week.    HL:  still taking and tolerating Lipitor   Outpatient Encounter Medications as of 01/10/2023  Medication Sig   acetaminophen (TYLENOL 8 HOUR) 650 MG CR tablet Take 1 tablet (650 mg total) by mouth every 8 (eight) hours as needed for pain or fever. (Patient not taking: Reported on 09/10/2022)   amLODipine (NORVASC) 10 MG tablet TAKE 1 TABLET BY MOUTH EVERY DAY   aspirin (ASPIRIN LOW DOSE) 81 MG EC tablet Take 1 tablet (81 mg total) by mouth daily. Swallow whole. (Patient not taking: Reported on 09/10/2022)   atorvastatin (LIPITOR) 40 MG tablet TAKE 1 TABLET BY MOUTH EVERY DAY   Cholecalciferol (VITAMIN D3) 2000 UNITS TABS Take 2,000 Units by mouth daily. (Patient taking differently: Take 2,000 Units by mouth 2 (two) times a week.)   diclofenac Sodium (VOLTAREN) 1 % GEL Apply 2 g topically 4 (four) times daily.   DULoxetine (CYMBALTA) 60 MG capsule TAKE 1 CAPSULE BY MOUTH EVERY DAY   gabapentin (NEURONTIN) 300 MG capsule Take 1 capsule (300 mg total) by mouth at bedtime. For nerve pain   glucose blood (CONTOUR NEXT TEST)  test strip Use as instructed   hydrochlorothiazide (HYDRODIURIL) 25 MG tablet Take 1 tablet (25 mg total) by mouth daily.   hydrOXYzine (VISTARIL) 25 MG capsule TAKE 1 CAPSULE (25 MG TOTAL) BY MOUTH AT BEDTIME AS NEEDED FOR ANXIETY.   Lurasidone HCl 60 MG TABS Take 1 tablet (60 mg total) by mouth daily with breakfast.   metFORMIN (GLUCOPHAGE) 500 MG tablet PLEASE SEE ATTACHED FOR DETAILED DIRECTIONS   metoprolol tartrate (LOPRESSOR) 50 MG tablet TAKE 1 TABLET BY MOUTH TWICE A DAY   montelukast (SINGULAIR) 10 MG tablet TAKE 1 TABLET BY MOUTH EVERY DAY   valsartan (DIOVAN) 80 MG tablet Take 1 tablet (80 mg  total) by mouth daily. For blood pressure   No facility-administered encounter medications on file as of 01/10/2023.      Observations/Objective: No direct observation done as this was a telephone visit.  Lab Results  Component Value Date   HGBA1C 6.0 09/10/2022   Lab Results  Component Value Date   CHOL 162 12/05/2021   HDL 42 12/05/2021   LDLCALC 100 (H) 12/05/2021   TRIG 108 12/05/2021   CHOLHDL 3.9 12/05/2021     Chemistry      Component Value Date/Time   NA 141 12/05/2021 1025   K 4.2 12/05/2021 1025   CL 105 12/05/2021 1025   CO2 23 12/05/2021 1025   BUN 13 12/05/2021 1025   CREATININE 0.84 12/05/2021 1025   CREATININE 0.85 04/25/2015 1602      Component Value Date/Time   CALCIUM 9.9 12/05/2021 1025   ALKPHOS 133 (H) 12/05/2021 1025   AST 16 12/05/2021 1025   ALT 17 12/05/2021 1025   BILITOT 0.4 12/05/2021 1025      Assessment and Plan: 1. Type 2 diabetes mellitus with morbid obesity ([place]) Encourage patient to continue healthy eating habits and regular exercise. Continue metformin. Prescription sent for diabetic testing supplies.  Advised to check blood sugars once a day before a meal - glucose blood (ACCU-CHEK GUIDE) test strip; Use as instructed to check blood sugars once a day.  He 11.69, E66.01  Dispense: 100 each; Refill: 12 - Accu-Chek Softclix Lancets lancets; Use as instructed to check blood sugars once a day.  E11.69, E66.01  Dispense: 100 each; Refill: 12 - Blood Glucose Monitoring Suppl (ACCU-CHEK GUIDE) w/Device KIT; Check blood sugars once a day. E11.69, E66.01  Dispense: 1 kit; Refill: 0 - CBC; Future - Comprehensive metabolic panel; Future - Lipid panel; Future - Microalbumin / creatinine urine ratio; Future - Hemoglobin A1c; Future - metFORMIN (GLUCOPHAGE) 500 MG tablet; 1 tablet p.o. twice a day  Dispense: 180 tablet; Refill: 1  2. Hypertension associated with diabetes ([place]) Home blood pressure readings are not at goal which is 130/80 or  lower.  I would like to increase the Diovan from 80 mg to 160 mg.  However before doing so I would like to get chemistry since she did not return to the lab from last visit when we increased the dose from 40 mg to 80 mg daily.  She plans to come to the lab next week.  In the meantime she will continue current doses of medicines listed above. - hydrochlorothiazide (HYDRODIURIL) 25 MG tablet; Take 1 tablet (25 mg total) by mouth daily.  Dispense: 90 tablet; Refill: 3  3. Hyperlipidemia associated with type 2 diabetes mellitus ([facility]) Continue atorvastatin. - atorvastatin (LIPITOR) 40 MG tablet; Take 1 tablet (40 mg total) by mouth daily.  Dispense: 90 tablet; Refill: 1  Follow  Up Instructions: 4 mths   I discussed the assessment and treatment plan with the patient. The patient was provided an opportunity to ask questions and all were answered. The patient agreed with the plan and demonstrated an understanding of the instructions.   The patient was advised to call back or seek an in-person evaluation if the symptoms worsen or if the condition fails to improve as anticipated.  I  Spent 11 minutes on this telephone encounter  This note has been created with Surveyor, quantity. Any transcriptional errors are unintentional.  Karle Plumber, MD

## 2023-01-22 ENCOUNTER — Encounter: Payer: Self-pay | Admitting: Physician Assistant

## 2023-01-22 ENCOUNTER — Ambulatory Visit: Payer: 59 | Admitting: Physician Assistant

## 2023-01-22 ENCOUNTER — Ambulatory Visit: Payer: Self-pay | Admitting: *Deleted

## 2023-01-22 VITALS — BP 137/95 | HR 72 | Ht 62.0 in | Wt 256.0 lb

## 2023-01-22 DIAGNOSIS — B9689 Other specified bacterial agents as the cause of diseases classified elsewhere: Secondary | ICD-10-CM | POA: Diagnosis not present

## 2023-01-22 DIAGNOSIS — J019 Acute sinusitis, unspecified: Secondary | ICD-10-CM

## 2023-01-22 MED ORDER — FLUTICASONE PROPIONATE 50 MCG/ACT NA SUSP: 2.0000 | Freq: Every day | NASAL | 6 refills | Status: DC

## 2023-01-22 MED ORDER — AMOXICILLIN-POT CLAVULANATE 875-125 MG PO TABS: 1.0000 | ORAL_TABLET | Freq: Two times a day (BID) | ORAL | 0 refills | Status: DC

## 2023-01-22 NOTE — Progress Notes (Signed)
Established Patient Office Visit  Subjective   Patient ID: Michele Wood, female    DOB: 06/07/1961  Age: 62 y.o. MRN: [id number]  Chief Complaint  Patient presents with   Nasal Congestion    Headache, facial pain     States that she has not been having green nasal discharge, sinus pressure, denies headache, and scratchy throat for the past week.  Denies cough, body aches.  States that she has tried over-the-counter severe sinus nasal spray, Claritin, and humidifier without relief.  Denies sick contacts       Past Medical History:  Diagnosis Date   Cervical radiculopathy    Chronic back pain    DDD (degenerative disc disease), cervical    Depression    Diabetes mellitus without complication    Hypertension    at age 38   Obesity    Pancreatic cyst 05/31/2019   Incidental - discovered on CT scan   s/p minimally-invasive resection of left atrial myxoma 06/08/2019   Sinusitis    Sleep apnea    at age 84   Social History   Socioeconomic History   Marital status: Married    Spouse name: John   Number of children: 2   Years of education: Not on file   Highest education level: Some college, no degree  Occupational History    Comment: home maker  Tobacco Use   Smoking status: Never   Smokeless tobacco: Never  Vaping Use   Vaping Use: Never used  Substance and Sexual Activity   Alcohol use: Yes    Alcohol/week: 0.0 standard drinks of alcohol    Comment: rare   Drug use: No   Sexual activity: Not Currently  Other Topics Concern   Not on file  Social History Narrative   Lives with husband   Social Determinants of Health   Financial Resource Strain: High Risk (01/21/2018)   Overall Financial Resource Strain (CARDIA)    Difficulty of Paying Living Expenses: Very hard  Food Insecurity: Food Insecurity Present (01/21/2018)   Hunger Vital Sign    Worried About Running Out of Food in the Last Year: Often true    Ran Out of Food in the Last Year: Often true   Transportation Needs: Unmet Transportation Needs (06/25/2022)   PRAPARE - Transportation    Lack of Transportation (Medical): Yes    Lack of Transportation (Non-Medical): Yes  Physical Activity: Sufficiently Active (01/21/2018)   Exercise Vital Sign    Days of Exercise per Week: 7 days    Minutes of Exercise per Session: 60 min  Stress: Stress Concern Present (01/21/2018)   [employer] of Occupational Health - Occupational Stress Questionnaire    Feeling of Stress : Very much  Social Connections: Unknown (01/21/2018)   Social Connection and Isolation Panel [NHANES]    Frequency of Communication with Friends and Family: Three times a week    Frequency of Social Gatherings with Friends and Family: Not on file    Attends Religious Services: Not on file    Active Member of Clubs or Organizations: Not on file    Attends Banker Meetings: Not on file    Marital Status: Not on file  Intimate Partner Violence: Not At Risk (01/21/2018)   Humiliation, Afraid, Rape, and Kick questionnaire    Fear of Current or Ex-Partner: No    Emotionally Abused: No    Physically Abused: No    Sexually Abused: No   Family History  Problem Relation Age  of Onset   Hypertension Mother    Schizophrenia Brother    Hypertension Other    Allergies  Allergen Reactions   Codeine Sulfate Rash   Milk-Related Compounds Diarrhea and Other (See Comments)    Diarrhea and stomach upset    Review of Systems  Constitutional:  Negative for chills and fever.  HENT:  Positive for congestion, sinus pain and sore throat. Negative for ear pain.   Eyes: Negative.   Respiratory:  Negative for cough and shortness of breath.   Cardiovascular:  Negative for chest pain.  Gastrointestinal:  Negative for nausea and vomiting.  Genitourinary: Negative.   Musculoskeletal:  Negative for myalgias.  Skin: Negative.   Neurological:  Positive for headaches.  Endo/Heme/Allergies: Negative.   Psychiatric/Behavioral:  Negative.        Objective:     BP (!) 137/95 (BP Location: Left Arm, Patient Position: Sitting, Cuff Size: Large)   Pulse 72   Ht 5\' 2"  (1.575 m)   Wt 256 lb (116.1 kg)   LMP 09/01/2013   SpO2 97%   BMI 46.82 kg/m  BP Readings from Last 3 Encounters:  01/22/23 (!) 137/95  09/10/22 (!) 137/93  06/18/22 (!) 143/95   Wt Readings from Last 3 Encounters:  01/22/23 256 lb (116.1 kg)  09/10/22 242 lb (109.8 kg)  06/18/22 244 lb 11.4 oz (111 kg)      Physical Exam Vitals and nursing note reviewed.  Constitutional:      Appearance: Normal appearance.  HENT:     Head: Normocephalic.     Salivary Glands: Right salivary gland is not diffusely enlarged or tender. Left salivary gland is not diffusely enlarged or tender.     Right Ear: Tympanic membrane, ear canal and external ear normal.     Left Ear: Tympanic membrane, ear canal and external ear normal.     Nose:     Right Turbinates: Enlarged and swollen.     Left Turbinates: Enlarged and swollen.     Right Sinus: Maxillary sinus tenderness and frontal sinus tenderness present.     Left Sinus: Maxillary sinus tenderness and frontal sinus tenderness present.     Mouth/Throat:     Lips: Pink.     Pharynx: Oropharynx is clear.     Tonsils: No tonsillar exudate.  Eyes:     Extraocular Movements: Extraocular movements intact.     Conjunctiva/sclera: Conjunctivae normal.     Pupils: Pupils are equal, round, and reactive to light.  Cardiovascular:     Rate and Rhythm: Normal rate and regular rhythm.     Pulses: Normal pulses.     Heart sounds: Normal heart sounds.  Pulmonary:     Effort: Pulmonary effort is normal.     Breath sounds: Normal breath sounds.  Musculoskeletal:        General: Normal range of motion.     Cervical back: Normal range of motion and neck supple.  Skin:    General: Skin is warm and dry.  Neurological:     General: No focal deficit present.     Mental Status: She is alert and oriented to person,  place, and time.  Psychiatric:        Mood and Affect: Mood normal.        Behavior: Behavior normal.        Thought Content: Thought content normal.        Judgment: Judgment normal.        Assessment & Plan:  Problem List Items Addressed This Visit   None Visit Diagnoses     Acute bacterial sinusitis    -  Primary   Relevant Medications   amoxicillin-clavulanate (AUGMENTIN) 875-125 MG tablet   fluticasone (FLONASE) 50 MCG/ACT nasal spray      1. Acute bacterial sinusitis Trial Augmentin, Flonase, continue Claritin.  Patient education given on supportive care.  Red flags given for prompt reevaluation. - amoxicillin-clavulanate (AUGMENTIN) 875-125 MG tablet; Take 1 tablet by mouth 2 (two) times daily.  Dispense: 20 tablet; Refill: 0 - fluticasone (FLONASE) 50 MCG/ACT nasal spray; Place 2 sprays into both nostrils daily.  Dispense: 16 g; Refill: 6   I have reviewed the patient's medical history (PMH, PSH, Social History, Family History, Medications, and allergies) , and have been updated if relevant. I spent 20 minutes reviewing chart and  face to face time with patient.    Return if symptoms worsen or fail to improve.    Kasandra Knudsen Mayers, PA-C

## 2023-01-22 NOTE — Telephone Encounter (Signed)
Message from Valora Piccolo sent at 01/22/2023  8:13 AM EDT  Summary: Sinus Advice   Pt is calling to report concerns with her sinuses for 1 week. With face pain, headache, and Congestion. No available appt [facility]. Please advise          Call History   Type Contact Phone/Fax User  01/22/2023 08:11 AM EDT Phone (Incoming) [place], [place] (Self) [phone] Rexene Edison) Jens Som A   Reason for Disposition  Fever present > 3 days (72 hours)  Answer Assessment - Initial Assessment Questions 1. LOCATION: "Where does it hurt?"      Sinus pain for a couple of days.   Headaches, facial pain and congestion.   When I lay down it's hard to breath.   Hurting around my eyes and top and back of my head. 2. ONSET: "When did the sinus pain start?"  (e.g., hours, days)      2 days to a week.    I had sinus infections often. 3. SEVERITY: "How bad is the pain?"   (Scale 1-10; mild, moderate or severe)   - MILD (1-3): doesn't interfere with normal activities    - MODERATE (4-7): interferes with normal activities (e.g., work or school) or awakens from sleep   - SEVERE (8-10): excruciating pain and patient unable to do any normal activities        Moderate 4. RECURRENT SYMPTOM: "Have you ever had sinus problems before?" If Yes, ask: "When was the last time?" and "What happened that time?"      Yes 5. NASAL CONGESTION: "Is the nose blocked?" If Yes, ask: "Can you open it or must you breathe through your mouth?"     Green mucus 6. NASAL DISCHARGE: "Do you have discharge from your nose?" If so ask, "What color?"     Nasal congestion with green mucus.   My throat is scratchy with drainage. 7. FEVER: "Do you have a fever?" If Yes, ask: "What is it, how was it measured, and when did it start?"      I'm having chills 8. OTHER SYMPTOMS: "Do you have any other symptoms?" (e.g., sore throat, cough, earache, difficulty breathing)     Not coughing. 9. PREGNANCY: "Is there any chance you are pregnant?" "When  was your last menstrual period?"     N/A  Protocols used: Sinus Pain or Congestion-A-AH

## 2023-01-22 NOTE — Patient Instructions (Signed)
To help with your sinus infection, you are going to take Augmentin twice a day for 7 days.  You are also going to use Flonase as directed.  It is okay to increase the Flonase to twice daily for the first couple of days.  Continue using your Claritin on a daily basis, as well as a humidifier.  Make sure that you are staying well-hydrated and get plenty of rest.  I hope that you feel better soon.  Please let us know if there is anything else we can do for you  Roney Jaffe, PA-C Physician Assistant [facility] [email or web address]   Sinus Infection, Adult A sinus infection, also called sinusitis, is inflammation of your sinuses. Sinuses are hollow spaces in the bones around your face. Your sinuses are located: Around your eyes. In the middle of your forehead. Behind your nose. In your cheekbones. Mucus normally drains out of your sinuses. When your nasal tissues become inflamed or swollen, mucus can become trapped or blocked. This allows bacteria, viruses, and fungi to grow, which leads to infection. Most infections of the sinuses are caused by a virus. A sinus infection can develop quickly. It can last for up to 4 weeks (acute) or for more than 12 weeks (chronic). A sinus infection often develops after a cold. What are the causes? This condition is caused by anything that creates swelling in the sinuses or stops mucus from draining. This includes: Allergies. Asthma. Infection from bacteria or viruses. Deformities or blockages in your nose or sinuses. Abnormal growths in the nose (nasal polyps). Pollutants, such as chemicals or irritants in the air. Infection from fungi. This is rare. What increases the risk? You are more likely to develop this condition if you: Have a weak body defense system (immune system). Do a lot of swimming or diving. Overuse nasal sprays. Smoke. What are the signs or symptoms? The main symptoms of this  condition are pain and a feeling of pressure around the affected sinuses. Other symptoms include: Stuffy nose or congestion that makes it difficult to breathe through your nose. Thick yellow or greenish drainage from your nose. Tenderness, swelling, and warmth over the affected sinuses. A cough that may get worse at night. Decreased sense of smell and taste. Extra mucus that collects in the throat or the back of the nose (postnasal drip) causing a sore throat or bad breath. Tiredness (fatigue). Fever. How is this diagnosed? This condition is diagnosed based on: Your symptoms. Your medical history. A physical exam. Tests to find out if your condition is acute or chronic. This may include: Checking your nose for nasal polyps. Viewing your sinuses using a device that has a light (endoscope). Testing for allergies or bacteria. Imaging tests, such as an MRI or CT scan. In rare cases, a bone biopsy may be done to rule out more serious types of fungal sinus disease. How is this treated? Treatment for a sinus infection depends on the cause and whether your condition is chronic or acute. If caused by a virus, your symptoms should go away on their own within 10 days. You may be given medicines to relieve symptoms. They include: Medicines that shrink swollen nasal passages (decongestants). A spray that eases inflammation of the nostrils (topical intranasal corticosteroids). Rinses that help get rid of thick mucus in your nose (nasal saline washes). Medicines that treat allergies (antihistamines). Over-the-counter pain relievers. If caused by bacteria, your health care provider may recommend waiting to see if your symptoms improve. Most  bacterial infections will get better without antibiotic medicine. You may be given antibiotics if you have: A severe infection. A weak immune system. If caused by narrow nasal passages or nasal polyps, surgery may be needed. Follow these instructions at  home: Medicines Take, use, or apply over-the-counter and prescription medicines only as told by your health care provider. These may include nasal sprays. If you were prescribed an antibiotic medicine, take it as told by your health care provider. Do not stop taking the antibiotic even if you start to feel better. Hydrate and humidify  Drink enough fluid to keep your urine pale yellow. Staying hydrated will help to thin your mucus. Use a cool mist humidifier to keep the humidity level in your home above 50%. Inhale steam for 10-15 minutes, 3-4 times a day, or as told by your health care provider. You can do this in the bathroom while a hot shower is running. Limit your exposure to cool or dry air. Rest Rest as much as possible. Sleep with your head raised (elevated). Make sure you get enough sleep each night. General instructions  Apply a warm, moist washcloth to your face 3-4 times a day or as told by your health care provider. This will help with discomfort. Use nasal saline washes as often as told by your health care provider. Wash your hands often with soap and water to reduce your exposure to germs. If soap and water are not available, use hand sanitizer. Do not smoke. Avoid being around people who are smoking (secondhand smoke). Keep all follow-up visits. This is important. Contact a health care provider if: You have a fever. Your symptoms get worse. Your symptoms do not improve within 10 days. Get help right away if: You have a severe headache. You have persistent vomiting. You have severe pain or swelling around your face or eyes. You have vision problems. You develop confusion. Your neck is stiff. You have trouble breathing. These symptoms may be an emergency. Get help right away. Call 911. Do not wait to see if the symptoms will go away. Do not drive yourself to the hospital. Summary A sinus infection is soreness and inflammation of your sinuses. Sinuses are hollow  spaces in the bones around your face. This condition is caused by nasal tissues that become inflamed or swollen. The swelling traps or blocks the flow of mucus. This allows bacteria, viruses, and fungi to grow, which leads to infection. If you were prescribed an antibiotic medicine, take it as told by your health care provider. Do not stop taking the antibiotic even if you start to feel better. Keep all follow-up visits. This is important. This information is not intended to replace advice given to you by your health care provider. Make sure you discuss any questions you have with your health care provider. Document Revised: 09/04/2021 Document Reviewed: 09/04/2021 Elsevier Patient Education  2023 [employer].

## 2023-01-22 NOTE — Telephone Encounter (Signed)
Noted  

## 2023-01-22 NOTE — Telephone Encounter (Signed)
  Chief Complaint: Facial pain from a sinus infection Symptoms: headaches, pain around eyes and top and back of head, nasal congestion with green mucus, scratchy throat, chills  Possible fever Frequency: For the last 2 days to a week Pertinent Negatives: Patient denies coughing Disposition: [] ED /[] Urgent Care (no appt availability in office) / [] Appointment(In office/virtual)/ []  [facility]/ [] Home Care/ [] Refused Recommended Disposition /[x] [facility]/ []  Follow-up with PCP Additional Notes: No appts with [facility] today.   She was agreeable to going to the [facility].    I gave her the location and hours.

## 2023-03-06 ENCOUNTER — Other Ambulatory Visit: Payer: Self-pay

## 2023-03-06 ENCOUNTER — Other Ambulatory Visit: Payer: Self-pay | Admitting: Internal Medicine

## 2023-03-06 ENCOUNTER — Emergency Department (HOSPITAL_COMMUNITY)
Admission: EM | Admit: 2023-03-06 | Discharge: 2023-03-06 | Disposition: A | Discharge status: Home / Self Care | Payer: 59 | Attending: Emergency Medicine | Admitting: Emergency Medicine

## 2023-03-06 DIAGNOSIS — Z7982 Long term (current) use of aspirin: Secondary | ICD-10-CM | POA: Insufficient documentation

## 2023-03-06 DIAGNOSIS — E1159 Type 2 diabetes mellitus with other circulatory complications: Secondary | ICD-10-CM

## 2023-03-06 DIAGNOSIS — R0981 Nasal congestion: Secondary | ICD-10-CM | POA: Insufficient documentation

## 2023-03-06 NOTE — ED Provider Notes (Signed)
[place] EMERGENCY DEPARTMENT AT [facility] Provider Note   CSN: [id number] Arrival date & time: 03/06/23  4010     History  Chief Complaint  Patient presents with   Facial Pain    Michele Wood is a 62 y.o. female.  Patient is a 62 year old female who presents with sinus congestion.  She says she has a longstanding history of recurrent sinus disease and has had 2 prior sinus surgeries.  She is having a flareup over the last 3 to 4 months.  She was seen by her PCP about a month ago and prescribed Augmentin.  She is also taking Flonase.  She has tried antihistamines in the past but they make her dried out and do not help.  She used to use sinus rinses which helps but has not used them in a while.  She is looking for some relief for her sinus pain.  She denies any known fevers.  No runny nose.  No sinus drainage.  She does have some pressure in her frontal sinuses.  She used to be followed by ear nose and throat doctor but has not seen them in many years.       Home Medications Prior to Admission medications   Medication Sig Start Date End Date Taking? Authorizing Provider  Accu-Chek Softclix Lancets lancets Use as instructed to check blood sugars once a day.  E11.69, E66.01 01/10/23   Marcine Matar, MD  acetaminophen (TYLENOL 8 HOUR) 650 MG CR tablet Take 1 tablet (650 mg total) by mouth every 8 (eight) hours as needed for pain or fever. Patient not taking: Reported on 09/10/2022 01/30/21   Derwood Kaplan, MD  amLODipine (NORVASC) 10 MG tablet TAKE 1 TABLET BY MOUTH EVERY DAY 12/11/22   Marcine Matar, MD  amoxicillin-clavulanate (AUGMENTIN) 875-125 MG tablet Take 1 tablet by mouth 2 (two) times daily. 01/22/23   Mayers, Cari S, PA-C  aspirin (ASPIRIN LOW DOSE) 81 MG EC tablet Take 1 tablet (81 mg total) by mouth daily. Swallow whole. Patient not taking: Reported on 09/10/2022 12/05/21   Anders Simmonds, PA-C  atorvastatin (LIPITOR) 40 MG tablet Take 1 tablet  (40 mg total) by mouth daily. 01/10/23   Marcine Matar, MD  Blood Glucose Monitoring Suppl (ACCU-CHEK GUIDE) w/Device KIT Check blood sugars once a day. E11.69, E66.01 01/10/23   Marcine Matar, MD  Cholecalciferol (VITAMIN D3) 2000 UNITS TABS Take 2,000 Units by mouth daily. Patient not taking: Reported on 01/22/2023 09/19/15   Dessa Phi, MD  diclofenac Sodium (VOLTAREN) 1 % GEL Apply 2 g topically 4 (four) times daily. 06/18/22   Cecil Cobbs, PA-C  DULoxetine (CYMBALTA) 60 MG capsule TAKE 1 CAPSULE BY MOUTH EVERY DAY 12/11/22   Marcine Matar, MD  fluticasone [facility]) 50 MCG/ACT nasal spray Place 2 sprays into both nostrils daily. 01/22/23   Mayers, Cari S, PA-C  gabapentin (NEURONTIN) 300 MG capsule Take 1 capsule (300 mg total) by mouth at bedtime. For nerve pain 12/05/21   Georgian Co M, PA-C  glucose blood (ACCU-CHEK GUIDE) test strip Use as instructed to check blood sugars once a day.  He 11.69, E66.01 01/10/23   Marcine Matar, MD  hydrochlorothiazide (HYDRODIURIL) 25 MG tablet Take 1 tablet (25 mg total) by mouth daily. 01/10/23   Marcine Matar, MD  hydrOXYzine (VISTARIL) 25 MG capsule TAKE 1 CAPSULE (25 MG TOTAL) BY MOUTH AT BEDTIME AS NEEDED FOR ANXIETY. 12/27/21   Marcine Matar,  MD  Lurasidone HCl 60 MG TABS Take 1 tablet (60 mg total) by mouth daily with breakfast. 02/01/21   Arfeen, Phillips Grout, MD  metFORMIN (GLUCOPHAGE) 500 MG tablet 1 tablet p.o. twice a day 01/10/23   Marcine Matar, MD  metoprolol tartrate (LOPRESSOR) 50 MG tablet TAKE 1 TABLET BY MOUTH TWICE A DAY 12/11/22   Marcine Matar, MD  montelukast (SINGULAIR) 10 MG tablet TAKE 1 TABLET BY MOUTH EVERY DAY 12/11/22   Marcine Matar, MD  valsartan (DIOVAN) 80 MG tablet TAKE 1 TABLET (80 MG TOTAL) BY MOUTH DAILY. FOR BLOOD PRESSURE 03/06/23   Marcine Matar, MD      Allergies    Codeine sulfate and Milk-related compounds    Review of Systems   Review of Systems  Constitutional:   Negative for fatigue and fever.  HENT:  Positive for congestion, sinus pressure and sinus pain. Negative for ear discharge, facial swelling, postnasal drip, rhinorrhea, trouble swallowing and voice change.   Eyes:  Negative for photophobia, redness and visual disturbance.  Respiratory:  Negative for cough and shortness of breath.   Cardiovascular:  Negative for chest pain.  Gastrointestinal:  Negative for nausea and vomiting.  Musculoskeletal:  Negative for back pain, neck pain and neck stiffness.  Skin:  Negative for rash.  Neurological:  Positive for headaches.    Physical Exam Updated Vital Signs BP (!) 161/112 (BP Location: Right Arm)   Pulse 86   Temp 97.8 F (36.6 C) (Oral)   Resp 18   Ht 5\' 4"  (1.626 m)   Wt 113.4 kg   LMP 09/01/2013   SpO2 100%   BMI 42.91 kg/m  Physical Exam Constitutional:      Appearance: She is well-developed.  HENT:     Head: Normocephalic and atraumatic.     Comments: Mild tenderness over frontal sinuses    Mouth/Throat:     Mouth: Mucous membranes are dry.     Pharynx: No oropharyngeal exudate or posterior oropharyngeal erythema.  Eyes:     Extraocular Movements: Extraocular movements intact.     Pupils: Pupils are equal, round, and reactive to light.  Cardiovascular:     Rate and Rhythm: Normal rate and regular rhythm.     Heart sounds: Normal heart sounds.  Pulmonary:     Effort: Pulmonary effort is normal. No respiratory distress.     Breath sounds: Normal breath sounds. No wheezing or rales.  Chest:     Chest wall: No tenderness.  Abdominal:     General: Bowel sounds are normal.     Palpations: Abdomen is soft.     Tenderness: There is no abdominal tenderness. There is no guarding or rebound.  Musculoskeletal:        General: Normal range of motion.     Cervical back: Normal range of motion and neck supple.  Lymphadenopathy:     Cervical: No cervical adenopathy.  Skin:    General: Skin is warm and dry.     Findings: No rash.   Neurological:     Mental Status: She is alert and oriented to person, place, and time.     ED Results / Procedures / Treatments   Labs (all labs ordered are listed, but only abnormal results are displayed) Labs Reviewed - No data to display  EKG None  Radiology No results found.  Procedures Procedures    Medications Ordered in ED Medications - No data to display  ED Course/ Medical Decision Making/ A&P  Medical Decision Making Amount and/or Complexity of Data Reviewed External Data Reviewed: notes.  Risk OTC drugs.   Patient is a 62 year old who has longstanding history of sinus disease is requesting some symptomatic relief for her sinus pressure.  She says that she has tried pretty much everything under the sun and it has not been effective.  She does not have a fever or other indications of a bacterial infection so do not feel that she needs an antibiotic.  Discussed decongestants but she has elevated blood pressures and this would not be appropriate.  She does not like taking antihistamines because she feels like it dries it out more.  Discussed short course of steroids but she is a diabetic and does not have a glucometer at home so do not feel that this would be appropriate.  She does say that she has used Mucinex in the past and has been effective.  Discussed with her trying that and also the saline rinses which have been effective in the past.  Will refer her back to ENT.  Final Clinical Impression(s) / ED Diagnoses Final diagnoses:  Sinus congestion    Rx / DC Orders ED Discharge Orders     None         Rolan Bucco, MD 03/06/23 1049

## 2023-03-06 NOTE — ED Triage Notes (Signed)
Pt reports sinus infection last month, has been having "sinus pain" since then.

## 2023-03-06 NOTE — Discharge Instructions (Signed)
Continue using her Flonase.  Use Mucinex and sinus rinses discussed.  Follow-up with the ENT listed above.  You will need to call to make an appointment.  Return to emergency room if you have any worsening symptoms.  Your blood pressure was elevated.  Please have this rechecked by your primary care doctor.

## 2023-05-15 ENCOUNTER — Ambulatory Visit: Payer: 59 | Attending: Internal Medicine | Admitting: Internal Medicine

## 2023-05-15 ENCOUNTER — Encounter: Payer: Self-pay | Admitting: Internal Medicine

## 2023-05-15 DIAGNOSIS — Z8709 Personal history of other diseases of the respiratory system: Secondary | ICD-10-CM

## 2023-05-15 DIAGNOSIS — E1169 Type 2 diabetes mellitus with other specified complication: Secondary | ICD-10-CM

## 2023-05-15 DIAGNOSIS — E119 Type 2 diabetes mellitus without complications: Secondary | ICD-10-CM

## 2023-05-15 DIAGNOSIS — E1159 Type 2 diabetes mellitus with other circulatory complications: Secondary | ICD-10-CM | POA: Diagnosis not present

## 2023-05-15 DIAGNOSIS — L84 Corns and callosities: Secondary | ICD-10-CM | POA: Diagnosis not present

## 2023-05-15 DIAGNOSIS — Z7984 Long term (current) use of oral hypoglycemic drugs: Secondary | ICD-10-CM

## 2023-05-15 DIAGNOSIS — I152 Hypertension secondary to endocrine disorders: Secondary | ICD-10-CM

## 2023-05-15 DIAGNOSIS — E785 Hyperlipidemia, unspecified: Secondary | ICD-10-CM

## 2023-05-15 LAB — POCT GLYCOSYLATED HEMOGLOBIN (HGB A1C): HbA1c, POC (controlled diabetic range): 6.4 % (ref 0.0–7.0)

## 2023-05-15 LAB — GLUCOSE, POCT (MANUAL RESULT ENTRY): POC Glucose: 168 mg/dl — AB (ref 70–99)

## 2023-05-15 NOTE — Progress Notes (Signed)
Patient ID: Michele Wood, female    DOB: [birth year]  MRN: [id number]  CC: Diabetes (DM f/u. Nicki Reaper sinus issues worsening pain radiating to side of R & L face /Pain on R & L shoulder - radiating to fingers of R & L hand /Yes to shingles vax.)   Subjective: Michele Wood is a 62 y.o. female who presents for chronic disease management.  Husband is with her. Her concerns today include:  Hx of HTN, LT atrial myxoma (s/p minimally invasive surgery 05/2019), minimal CAD on cardiac CT, chronic sinusitis, degen disc of c-spine, chronic LBP, vit D def, obesity, anx/dep,  DM (with ? neuropathy in feet from DM vs lower back), dep with psychosis.   Patient reports issues with her sinuses.  She has history of chronic sinusitis and sinus surgery. C/o pain around eyes, little sore throat and LT ear feels congested.  Feeling better today. Seen in ER for sinus pain 02/2023 and was referred to ENT but they did not accept her insurance.Marland Kitchen  DM/Obesity: Results for orders placed or performed in visit on 05/15/23  POCT glucose (manual entry)  Result Value Ref Range   POC Glucose 168 (A) 70 - 99 mg/dl  POCT glycosylated hemoglobin (Hb A1C)  Result Value Ref Range   Hemoglobin A1C     HbA1c POC (<> result, manual entry)     HbA1c, POC (prediabetic range)     HbA1c, POC (controlled diabetic range) 6.4 0.0 - 7.0 %    Compliant with Metformin 500 mg BID.  Prescription sent for testing supplies on last visit.  Checking BS 2x/wk.  Range in the 120s Amount that she eats varies.  Some days a lot, other days less. Was going to gym with sisters several times a wk.  Has not gone in 3 wks because one side has been baby sitting and other sister taking care of spouse with health issues.    HTN: reports compliance with  Norvasc 10 mg daily, Diovan 80 mg daily, HCTZ 25 mg daily and metoprolol 50 mg twice a day.  Tries to limit salt in foods No CP/LE edema  HL: Taking and tolerating atorvastatin 40 mg  daily.  MDD: no issues with depression at this time.   No longer on Cymbalta  HM:  Thinks she had Shingrix last yr at [facility] on [place] Patient Active Problem List   Diagnosis Date Noted   Hyperlipidemia associated with type 2 diabetes mellitus (HCC) 02/28/2020   Hypotension 07/19/2019   CAD (coronary artery disease) 06/23/2019   S/P minimally-invasive resection of left atrial myxoma 06/08/2019   Pancreatic cyst 05/31/2019   Spondylolisthesis of lumbar region 03/31/2019   Atrial myxoma 03/19/2019   Dyslipidemia, goal LDL below 70 12/16/2018   Family history of heart disease 12/16/2018   Left bundle branch block 12/16/2018   History of chest pain 12/16/2018   MDD (major depressive disorder), recurrent episode, moderate (HCC) 10/19/2018   Non-insulin treated type 2 diabetes mellitus (HCC) 12/12/2017   Morbid obesity (HCC) 09/12/2017   Anxiety and depression 06/13/2017   Cervical radiculopathy due to intervertebral disc disorder 06/13/2017   Chronic low back pain without sciatica 09/18/2015   DJD (degenerative joint disease), lumbar 06/08/2015   Vitamin D insufficiency 04/25/2014   HTN (hypertension) 06/21/2008     Current Outpatient Medications on File Prior to Visit  Medication Sig Dispense Refill   Accu-Chek Softclix Lancets lancets Use as instructed to check blood sugars once a day.  E11.69, E66.01 100  each 12   acetaminophen (TYLENOL 8 HOUR) 650 MG CR tablet Take 1 tablet (650 mg total) by mouth every 8 (eight) hours as needed for pain or fever. 30 tablet 0   amLODipine (NORVASC) 10 MG tablet TAKE 1 TABLET BY MOUTH EVERY DAY 90 tablet 1   aspirin (ASPIRIN LOW DOSE) 81 MG EC tablet Take 1 tablet (81 mg total) by mouth daily. Swallow whole. 90 tablet 1   atorvastatin (LIPITOR) 40 MG tablet Take 1 tablet (40 mg total) by mouth daily. 90 tablet 1   Blood Glucose Monitoring Suppl (ACCU-CHEK GUIDE) w/Device KIT Check blood sugars once a day. E11.69, E66.01 1 kit 0   Cholecalciferol  (VITAMIN D3) 2000 UNITS TABS Take 2,000 Units by mouth daily. 30 tablet 11   diclofenac Sodium (VOLTAREN) 1 % GEL Apply 2 g topically 4 (four) times daily. 100 g 0   DULoxetine (CYMBALTA) 60 MG capsule TAKE 1 CAPSULE BY MOUTH EVERY DAY 90 capsule 1   fluticasone (FLONASE) 50 MCG/ACT nasal spray Place 2 sprays into both nostrils daily. 16 g 6   gabapentin (NEURONTIN) 300 MG capsule Take 1 capsule (300 mg total) by mouth at bedtime. For nerve pain 90 capsule 1   glucose blood (ACCU-CHEK GUIDE) test strip Use as instructed to check blood sugars once a day.  He 11.69, E66.01 100 each 12   hydrochlorothiazide (HYDRODIURIL) 25 MG tablet Take 1 tablet (25 mg total) by mouth daily. 90 tablet 3   hydrOXYzine (VISTARIL) 25 MG capsule TAKE 1 CAPSULE (25 MG TOTAL) BY MOUTH AT BEDTIME AS NEEDED FOR ANXIETY. 90 capsule 1   Lurasidone HCl 60 MG TABS Take 1 tablet (60 mg total) by mouth daily with breakfast. 30 tablet 2   metFORMIN (GLUCOPHAGE) 500 MG tablet 1 tablet p.o. twice a day 180 tablet 1   metoprolol tartrate (LOPRESSOR) 50 MG tablet TAKE 1 TABLET BY MOUTH TWICE A DAY 180 tablet 1   montelukast (SINGULAIR) 10 MG tablet TAKE 1 TABLET BY MOUTH EVERY DAY 90 tablet 1   valsartan (DIOVAN) 80 MG tablet TAKE 1 TABLET (80 MG TOTAL) BY MOUTH DAILY. FOR BLOOD PRESSURE 90 tablet 0   No current facility-administered medications on file prior to visit.    Allergies  Allergen Reactions   Codeine Sulfate Rash   Milk-Related Compounds Diarrhea and Other (See Comments)    Diarrhea and stomach upset    Social History   Socioeconomic History   Marital status: Married    Spouse name: John   Number of children: 2   Years of education: Not on file   Highest education level: Some college, no degree  Occupational History    Comment: home maker  Tobacco Use   Smoking status: Never   Smokeless tobacco: Never  Vaping Use   Vaping status: Never Used  Substance and Sexual Activity   Alcohol use: Yes     Alcohol/week: 0.0 standard drinks of alcohol    Comment: rare   Drug use: No   Sexual activity: Not Currently  Other Topics Concern   Not on file  Social History Narrative   Lives with husband   Social Determinants of Health   Financial Resource Strain: High Risk (01/21/2018)   Overall Financial Resource Strain (CARDIA)    Difficulty of Paying Living Expenses: Very hard  Food Insecurity: Food Insecurity Present (01/21/2018)   Hunger Vital Sign    Worried About Running Out of Food in the Last Year: Often true  Ran Out of Food in the Last Year: Often true  Transportation Needs: Unmet Transportation Needs (06/25/2022)   PRAPARE - Transportation    Lack of Transportation (Medical): Yes    Lack of Transportation (Non-Medical): Yes  Physical Activity: Sufficiently Active (01/21/2018)   Exercise Vital Sign    Days of Exercise per Week: 7 days    Minutes of Exercise per Session: 60 min  Stress: Stress Concern Present (01/21/2018)   [employer] of Occupational Health - Occupational Stress Questionnaire    Feeling of Stress : Very much  Social Connections: Unknown (01/21/2018)   Social Connection and Isolation Panel [NHANES]    Frequency of Communication with Friends and Family: Three times a week    Frequency of Social Gatherings with Friends and Family: Not on file    Attends Religious Services: Not on file    Active Member of Clubs or Organizations: Not on file    Attends Banker Meetings: Not on file    Marital Status: Not on file  Intimate Partner Violence: Not At Risk (01/21/2018)   Humiliation, Afraid, Rape, and Kick questionnaire    Fear of Current or Ex-Partner: No    Emotionally Abused: No    Physically Abused: No    Sexually Abused: No    Family History  Problem Relation Age of Onset   Hypertension Mother    Schizophrenia Brother    Hypertension Other     Past Surgical History:  Procedure Laterality Date   ABDOMINAL HYSTERECTOMY     BACK SURGERY   2020   lower back   BREAST BIOPSY Right 07/02/2006   [place] Core benign   EXPLORATION POST OPERATIVE OPEN HEART  2020   MINIMALLY INVASIVE EXCISION OF ATRIAL MYXOMA N/A 06/08/2019   Procedure: MINIMALLY INVASIVE RESECTION OF LEFT ATRIAL MYXOMA;  Surgeon: Purcell Nails, MD;  Location: [facility] OR;  Service: Open Heart Surgery;  Laterality: N/A;   NASAL SINUS SURGERY     TEE WITHOUT CARDIOVERSION N/A 05/17/2019   Procedure: TRANSESOPHAGEAL ECHOCARDIOGRAM (TEE);  Surgeon: Elease Hashimoto Deloris Ping, MD;  Location: [facility] ENDOSCOPY;  Service: Cardiovascular;  Laterality: N/A;   TEE WITHOUT CARDIOVERSION N/A 06/08/2019   Procedure: TRANSESOPHAGEAL ECHOCARDIOGRAM (TEE);  Surgeon: Purcell Nails, MD;  Location: [facility] OR;  Service: Open Heart Surgery;  Laterality: N/A;    ROS: Review of Systems Negative except as stated above  PHYSICAL EXAM: BP 116/81 (BP Location: Left Arm, Patient Position: Sitting, Cuff Size: Large)   Pulse 68   Temp 98.2 F (36.8 C) (Oral)   Ht 5\' 4"  (1.626 m)   Wt 254 lb (115.2 kg)   LMP 09/01/2013   SpO2 98%   BMI 43.60 kg/m   Wt Readings from Last 3 Encounters:  05/15/23 254 lb (115.2 kg)  03/06/23 250 lb (113.4 kg)  01/22/23 256 lb (116.1 kg)    Physical Exam  General appearance - alert, well appearing, and in no distress Mental status - normal mood, behavior, speech, dress, motor activity, and thought processes Ears - bilateral TM's and external ear canals normal Nose - normal and patent, no erythema, discharge or polyps Mouth - mucous membranes moist, pharynx normal without lesions Neck - supple, no significant adenopathy Chest - clear to auscultation, no wheezes, rales or rhonchi, symmetric air entry Heart - normal rate, regular rhythm, normal S1, S2, no murmurs, rubs, clicks or gallops Extremities - peripheral pulses normal, no pedal edema, no clubbing or cyanosis Diabetic Foot Exam - Simple   Simple  Foot Form Diabetic Foot exam was performed with the following  findings: Yes 05/15/2023  2:16 PM  Visual Inspection See comments: Yes Sensation Testing Intact to touch and monofilament testing bilaterally: Yes Pulse Check Posterior Tibialis and Dorsalis pulse intact bilaterally: Yes Comments Blueberry sized callus on the plantar medial aspect of the right big toe.  Painful to touch.         05/15/2023    1:32 PM 01/22/2023   10:57 AM 09/10/2022   11:52 AM  Depression screen PHQ 2/9  Decreased Interest 0 0 1  Down, Depressed, Hopeless 0 1 0  PHQ - 2 Score 0 1 1  Altered sleeping  0 2  Tired, decreased energy  0 1  Change in appetite  0 1  Feeling bad or failure about yourself   0 1  Trouble concentrating  1 1  Moving slowly or fidgety/restless  2 2  Suicidal thoughts  0 0  PHQ-9 Score  4 9       Latest Ref Rng & Units 12/05/2021   10:25 AM 08/18/2020   11:54 AM 11/05/2019   10:03 AM  CMP  Glucose 70 - 99 mg/dL 85  76  92   BUN 6 - 24 mg/dL 13  9  14    Creatinine 0.57 - 1.00 mg/dL 9.32  3.55  7.32   Sodium 134 - 144 mmol/L 141  138  145   Potassium 3.5 - 5.2 mmol/L 4.2  4.5  3.7   Chloride 96 - 106 mmol/L 105  100  103   CO2 20 - 29 mmol/L 23  27  26    Calcium 8.7 - 10.2 mg/dL 9.9  9.3  9.9   Total Protein 6.0 - 8.5 g/dL 7.3  7.5    Total Bilirubin 0.0 - 1.2 mg/dL 0.4  0.4    Alkaline Phos 44 - 121 IU/L 133  108    AST 0 - 40 IU/L 16  18    ALT 0 - 32 IU/L 17  18     Lipid Panel     Component Value Date/Time   CHOL 162 12/05/2021 1025   TRIG 108 12/05/2021 1025   HDL 42 12/05/2021 1025   CHOLHDL 3.9 12/05/2021 1025   CHOLHDL 6.1 10/26/2013 0949   VLDL 36 10/26/2013 0949   LDLCALC 100 (H) 12/05/2021 1025    CBC    Component Value Date/Time   WBC 6.0 12/05/2021 1025   WBC 10.8 (H) 06/27/2019 0040   RBC 4.99 12/05/2021 1025   RBC 4.15 06/27/2019 0040   HGB 12.1 12/05/2021 1025   HCT 37.4 12/05/2021 1025   PLT 319 12/05/2021 1025   MCV 75 (L) 12/05/2021 1025   MCH 24.2 (L) 12/05/2021 1025   MCH 23.9 (L) 06/27/2019  0040   MCHC 32.4 12/05/2021 1025   MCHC 30.0 06/27/2019 0040   RDW 13.9 12/05/2021 1025   LYMPHSABS 2.1 12/05/2021 1025   MONOABS 0.5 12/03/2013 1508   EOSABS 0.1 12/05/2021 1025   BASOSABS 0.0 12/05/2021 1025    ASSESSMENT AND PLAN: 1. Type 2 diabetes mellitus with morbid obesity (HCC) Discussed and encourage healthy eating habits. Encouraged her to try to get back into her exercise routine.  She may have to go to the gym without her sisters. Continue metformin - POCT glucose (manual entry) - POCT glycosylated hemoglobin (Hb A1C) - CBC - Comprehensive metabolic panel - Lipid panel - Microalbumin / creatinine urine ratio  2. Diabetes mellitus treated with oral  medication (HCC) See #1 above  3. Hypertension associated with diabetes (HCC) At goal.   Continue Norvasc 10 mg daily, Diovan 80 mg daily, HCTZ 25 mg daily and metoprolol 50 mg twice a day.   4. Hyperlipidemia associated with type 2 diabetes mellitus (HCC) Continue atorvastatin 40 mg daily.  5. Pre-ulcerative corn or callous Patient states she already has an appointment scheduled with [facility]  6. History of chronic sinusitis Not bothering her at this time.  Advised to let me know if this flares up on her again.     Patient was given the opportunity to ask questions.  Patient verbalized understanding of the plan and was able to repeat key elements of the plan.   This documentation was completed using Paediatric nurse.  Any transcriptional errors are unintentional.  Orders Placed This Encounter  Procedures   POCT glucose (manual entry)   POCT glycosylated hemoglobin (Hb A1C)     Requested Prescriptions    No prescriptions requested or ordered in this encounter    Return in about 4 months (around 09/14/2023) for Medicare Wellness Visit in 4   wks with CMA.  Jonah Blue, MD, FACP

## 2023-05-27 ENCOUNTER — Other Ambulatory Visit: Payer: Self-pay | Admitting: Internal Medicine

## 2023-05-27 DIAGNOSIS — E1159 Type 2 diabetes mellitus with other circulatory complications: Secondary | ICD-10-CM

## 2023-06-07 ENCOUNTER — Other Ambulatory Visit: Payer: Self-pay | Admitting: Internal Medicine

## 2023-06-07 DIAGNOSIS — J324 Chronic pansinusitis: Secondary | ICD-10-CM

## 2023-06-07 DIAGNOSIS — F331 Major depressive disorder, recurrent, moderate: Secondary | ICD-10-CM

## 2023-06-07 DIAGNOSIS — I10 Essential (primary) hypertension: Secondary | ICD-10-CM

## 2023-06-07 DIAGNOSIS — F4310 Post-traumatic stress disorder, unspecified: Secondary | ICD-10-CM

## 2023-06-11 ENCOUNTER — Other Ambulatory Visit: Payer: Self-pay | Admitting: Internal Medicine

## 2023-06-11 DIAGNOSIS — E785 Hyperlipidemia, unspecified: Secondary | ICD-10-CM

## 2023-07-22 ENCOUNTER — Ambulatory Visit: Payer: 59 | Attending: Internal Medicine

## 2023-07-22 VITALS — Ht 64.0 in | Wt 254.0 lb

## 2023-07-22 DIAGNOSIS — Z0000 Encounter for general adult medical examination without abnormal findings: Secondary | ICD-10-CM

## 2023-07-23 NOTE — Patient Instructions (Signed)
Michele Wood , Thank you for taking time to come for your Medicare Wellness Visit. I appreciate your ongoing commitment to your health goals. Please review the following plan we discussed and let me know if I can assist you in the future.   Referrals/Orders/Follow-Ups/Clinician Recommendations: Aim for 30 minutes of exercise or brisk walking, 6-8 glasses of water, and 5 servings of fruits and vegetables each day.  This is a list of the screening recommended for you and due dates:  Health Maintenance  Topic Date Due   Flu Shot  05/15/2023   COVID-19 Vaccine (3 - 2023-24 season) 06/15/2023   Eye exam for diabetics  07/10/2023   Hemoglobin A1C  11/15/2023   Cologuard (Stool DNA test)  02/24/2024   Yearly kidney function blood test for diabetes  05/14/2024   Yearly kidney health urinalysis for diabetes  05/14/2024   Complete foot exam   05/14/2024   Mammogram  06/04/2024   Medicare Annual Wellness Visit  07/21/2024   DTaP/Tdap/Td vaccine (2 - Td or Tdap) 09/03/2026   Hepatitis C Screening  Completed   HIV Screening  Completed   Zoster (Shingles) Vaccine  Completed   HPV Vaccine  Aged Out    Advanced directives: (ACP Link)Information on Advanced Care Planning can be found at [facility] Advance Health Care Directives Advance Health Care Directives ([email or web address]   Next Medicare Annual Wellness Visit scheduled for next year: Yes

## 2023-07-23 NOTE — Progress Notes (Signed)
Subjective:   Michele Wood is a 62 y.o. female who presents for Medicare Annual (Subsequent) preventive examination.  Visit Complete: Virtual I connected with  Michele Wood on 07/22/23 by a audio enabled telemedicine application and verified that I am speaking with the correct person using two identifiers.  Patient Location: Home  Provider Location: Home Office  I discussed the limitations of evaluation and management by telemedicine. The patient expressed understanding and agreed to proceed.  Vital Signs: Because this visit was a virtual/telehealth visit, some criteria may be missing or patient reported. Any vitals not documented were not able to be obtained and vitals that have been documented are patient reported.  Cardiac Risk Factors include: diabetes mellitus;hypertension;smoking/ tobacco exposure;dyslipidemia     Objective:    Today's Vitals   07/23/23 0927  Weight: 254 lb (115.2 kg)  Height: 5\' 4"  (1.626 m)   Body mass index is 43.6 kg/m.     07/23/2023    9:31 AM 03/06/2023    9:26 AM 06/18/2022   11:19 AM 06/10/2022    2:37 PM 02/13/2021    2:47 PM 01/30/2021    9:34 AM 06/27/2019    4:44 AM  Advanced Directives  Does Patient Have a Medical Advance Directive? Yes No No No No No No  Type of Estate agent of [place];Living will        Does patient want to make changes to medical advance directive? No - Patient declined        Copy of Healthcare Power of Attorney in Chart? Yes - validated most recent copy scanned in chart (See row information)        Would patient like information on creating a medical advance directive?    No - Patient declined No - Patient declined  No - Patient declined    Current Medications (verified) Outpatient Encounter Medications as of 07/22/2023  Medication Sig   Accu-Chek Softclix Lancets lancets Use as instructed to check blood sugars once a day.  E11.69, E66.01   acetaminophen (TYLENOL 8 HOUR) 650 MG CR  tablet Take 1 tablet (650 mg total) by mouth every 8 (eight) hours as needed for pain or fever.   amLODipine (NORVASC) 10 MG tablet TAKE 1 TABLET BY MOUTH EVERY DAY   aspirin (ASPIRIN LOW DOSE) 81 MG EC tablet Take 1 tablet (81 mg total) by mouth daily. Swallow whole.   atorvastatin (LIPITOR) 40 MG tablet TAKE 1 TABLET BY MOUTH EVERY DAY   Blood Glucose Monitoring Suppl (ACCU-CHEK GUIDE) w/Device KIT Check blood sugars once a day. E11.69, E66.01   Cholecalciferol (VITAMIN D3) 2000 UNITS TABS Take 2,000 Units by mouth daily.   diclofenac Sodium (VOLTAREN) 1 % GEL Apply 2 g topically 4 (four) times daily.   fluticasone (FLONASE) 50 MCG/ACT nasal spray Place 2 sprays into both nostrils daily.   gabapentin (NEURONTIN) 300 MG capsule Take 1 capsule (300 mg total) by mouth at bedtime. For nerve pain   glucose blood (ACCU-CHEK GUIDE) test strip Use as instructed to check blood sugars once a day.  He 11.69, E66.01   hydrochlorothiazide (HYDRODIURIL) 25 MG tablet Take 1 tablet (25 mg total) by mouth daily.   hydrOXYzine (VISTARIL) 25 MG capsule TAKE 1 CAPSULE (25 MG TOTAL) BY MOUTH AT BEDTIME AS NEEDED FOR ANXIETY.   Lurasidone HCl 60 MG TABS Take 1 tablet (60 mg total) by mouth daily with breakfast.   metFORMIN (GLUCOPHAGE) 500 MG tablet 1 tablet p.o. twice a day  metoprolol tartrate (LOPRESSOR) 50 MG tablet TAKE 1 TABLET BY MOUTH TWICE A DAY   montelukast (SINGULAIR) 10 MG tablet TAKE 1 TABLET BY MOUTH EVERY DAY   valsartan (DIOVAN) 80 MG tablet TAKE 1 TABLET (80 MG TOTAL) BY MOUTH DAILY. FOR BLOOD PRESSURE   No facility-administered encounter medications on file as of 07/22/2023.    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 06/08/2019   Sinusitis    Sleep apnea    at age 51   Past Surgical History:  Procedure Laterality Date   ABDOMINAL HYSTERECTOMY     BACK SURGERY  2020   lower back   BREAST BIOPSY Right 07/02/2006   [place] Core benign   EXPLORATION POST OPERATIVE OPEN HEART  2020   MINIMALLY INVASIVE EXCISION OF ATRIAL MYXOMA N/A 06/08/2019   Procedure: MINIMALLY INVASIVE RESECTION OF LEFT ATRIAL MYXOMA;  Surgeon: Purcell Nails, MD;  Location: [facility] OR;  Service: Open Heart Surgery;  Laterality: N/A;   NASAL SINUS SURGERY     TEE WITHOUT CARDIOVERSION N/A 05/17/2019   Procedure: TRANSESOPHAGEAL ECHOCARDIOGRAM (TEE);  Surgeon: Elease Hashimoto Deloris Ping, MD;  Location: [facility] ENDOSCOPY;  Service: Cardiovascular;  Laterality: N/A;   TEE WITHOUT CARDIOVERSION N/A 06/08/2019   Procedure: TRANSESOPHAGEAL ECHOCARDIOGRAM (TEE);  Surgeon: Purcell Nails, MD;  Location: [facility] OR;  Service: Open Heart Surgery;  Laterality: N/A;   Family History  Problem Relation Age of Onset   Hypertension Mother    Schizophrenia Brother    Hypertension Other    Social History   Socioeconomic History   Marital status: Married    Spouse name: Michele Wood   Number of children: 2   Years of education: Not on file   Highest education level: Some college, no degree  Occupational History    Comment: home maker  Tobacco Use   Smoking status: Never   Smokeless tobacco: Never  Vaping Use   Vaping status: Never Used  Substance and Sexual Activity   Alcohol use: Yes    Alcohol/week: 0.0 standard drinks of alcohol    Comment: rare   Drug use: No   Sexual activity: Not Currently  Other Topics Concern   Not on file  Social History Narrative   Lives with husband   Social Determinants of Health   Financial Resource Strain: Low Risk  (07/23/2023)   Overall Financial Resource Strain (CARDIA)    Difficulty of Paying Living  Expenses: Not hard at all  Food Insecurity: Food Insecurity Present (07/23/2023)   Hunger Vital Sign    Worried About Running Out of Food in the Last Year: Sometimes true    Ran Out of Food in the Last Year: Never true  Transportation Needs: No Transportation Needs (07/23/2023)   PRAPARE - Administrator, Civil Service (Medical): No    Lack of Transportation (Non-Medical): No  Physical Activity: Sufficiently Active (07/23/2023)   Exercise Vital Sign    Days of Exercise per Week: 5 days    Minutes of Exercise per Session: 30 min  Stress: No Stress Concern Present (  07/23/2023)   [employer] [employer] - Occupational Stress Questionnaire    Feeling of Stress : Not at all  Social Connections: Moderately Integrated (07/23/2023)   Social Connection and Isolation Panel [NHANES]    Frequency of Communication with Friends and Family: More than three times a week    Frequency of Social Gatherings with Friends and Family: Three times a week    Attends Religious Services: 1 to 4 times per year    Active Member of Clubs or Organizations: No    Attends Banker Meetings: Never    Marital Status: Married    Tobacco Counseling Counseling given: Not Answered   Clinical Intake:  Pre-visit preparation completed: Yes  Pain : No/denies pain  Diabetes: No  How often do you need to have someone help you when you read instructions, pamphlets, or other written materials from your doctor or pharmacy?: 1 - Never  Interpreter Needed?: No  Information entered by :: Kandis Fantasia LPN   Activities of Daily Living    07/23/2023    9:30 AM  In your present state of health, do you have any difficulty performing the following activities:  Hearing? 0  Vision? 0  Difficulty concentrating or making decisions? 0  Walking or climbing stairs? 0  Dressing or bathing? 0  Doing errands, shopping? 0  Preparing Food and eating ? N  Using the Toilet? N  In the past six  months, have you accidently leaked urine? N  Do you have problems with loss of bowel control? N  Managing your Medications? N  Managing your Finances? N  Housekeeping or managing your Housekeeping? N    Patient Care Team: Marcine Matar, MD as PCP - General (Internal Medicine) Runell Gess, MD as PCP - Cardiology (Cardiology)  Indicate any recent Medical Services you may have received from other than [facility] providers in the past year (date may be approximate).     Assessment:   This is a routine wellness examination for [place].  Hearing/Vision screen Hearing Screening - Comments:: Denies hearing difficulties   Vision Screening - Comments:: No vision problems; will schedule routine eye exam soon     Goals Addressed   None   Depression Screen    07/23/2023    9:29 AM 05/15/2023    1:32 PM 01/22/2023   10:57 AM 09/10/2022   11:52 AM 06/10/2022    2:38 PM 05/06/2022   11:47 AM 12/05/2021    9:58 AM  PHQ 2/9 Scores  PHQ - 2 Score 0 0 1 1 0 2 2  PHQ- 9 Score   4 9 4 8 9     Fall Risk    07/23/2023    9:30 AM 01/22/2023    9:24 AM 09/10/2022   11:41 AM 06/10/2022    2:38 PM 05/06/2022   11:10 AM  Fall Risk   Falls in the past year? 0 0 0 0 0  Number falls in past yr: 0 0 0 0 0  Injury with Fall? 0  0 0 0  Risk for fall due to : No Fall Risks No Fall Risks No Fall Risks  No Fall Risks  Follow up Falls prevention discussed;Education provided;Falls evaluation completed Falls evaluation completed  Falls evaluation completed;Education provided;Falls prevention discussed Falls evaluation completed    MEDICARE RISK AT HOME: Medicare Risk at Home Any stairs in or around the home?: No If so, are there any without handrails?: No Home free of loose throw rugs in  walkways, pet beds, electrical cords, etc?: Yes Adequate lighting in your home to reduce risk of falls?: Yes Life alert?: No Use of a cane, walker or w/c?: No Grab bars in the bathroom?: Yes Shower chair or bench in  shower?: No Elevated toilet seat or a handicapped toilet?: No  TIMED UP AND GO:  Was the test performed?  No    Cognitive Function:    06/10/2022    2:42 PM 02/13/2021    2:47 PM  MMSE - Mini Mental State Exam  Orientation to time  5  Orientation to Place 5 5  Registration 3 3  Attention/ Calculation 5 5  Recall 3 3  Language- name 2 objects 2 2  Language- repeat 1 1  Language- follow 3 step command 3 3  Language- read & follow direction 1 1  Write a sentence 1 1  Copy design 1 0  Total score  29        07/23/2023    9:31 AM  6CIT Screen  What Year? 0 points  What month? 0 points  What time? 0 points  Count back from 20 0 points  Months in reverse 0 points  Repeat phrase 0 points  Total Score 0 points    Immunizations Immunization History  Administered Date(s) Administered   Influenza Inj Mdck Quad With Preservative 08/27/2019   Influenza Whole 09/13/2008, 08/29/2019   Influenza,inj,Quad PF,6+ Mos 09/22/2014, 09/18/2015, 08/22/2016, 06/13/2017, 06/19/2018, 08/18/2020   Influenza-Unspecified 08/08/2022   PFIZER(Purple Top)SARS-COV-2 Vaccination 01/25/2020, 02/22/2020   Pneumococcal Polysaccharide-23 10/19/2018   Tdap 09/03/2016   Zoster Recombinant(Shingrix) 04/05/2021, 07/18/2021    TDAP status: Up to date  Flu Vaccine status: Up to date  Pneumococcal vaccine status: Up to date  Covid-19 vaccine status: Information provided on how to obtain vaccines.   Qualifies for Shingles Vaccine? Yes   Zostavax completed No   Shingrix Completed?: Yes  Screening Tests Health Maintenance  Topic Date Due   INFLUENZA VACCINE  05/15/2023   COVID-19 Vaccine (3 - 2023-24 season) 06/15/2023   OPHTHALMOLOGY EXAM  07/10/2023   HEMOGLOBIN A1C  11/15/2023   Fecal DNA (Cologuard)  02/24/2024   Diabetic kidney evaluation - eGFR measurement  05/14/2024   Diabetic kidney evaluation - Urine ACR  05/14/2024   FOOT EXAM  05/14/2024   MAMMOGRAM  06/04/2024   Medicare Annual  Wellness (AWV)  07/21/2024   DTaP/Tdap/Td (2 - Td or Tdap) 09/03/2026   Hepatitis C Screening  Completed   HIV Screening  Completed   Zoster Vaccines- Shingrix  Completed   HPV VACCINES  Aged Out    Health Maintenance  Health Maintenance Due  Topic Date Due   INFLUENZA VACCINE  05/15/2023   COVID-19 Vaccine (3 - 2023-24 season) 06/15/2023   OPHTHALMOLOGY EXAM  07/10/2023    Colorectal cancer screening: Type of screening: Cologuard. Completed 02/23/21. Repeat every 3 years  Mammogram status: Completed 06/04/22. Repeat every year  Lung Cancer Screening: (Low Dose CT Chest recommended if Age 31-80 years, 20 pack-year currently smoking OR have quit w/in 15years.) does not qualify.   Lung Cancer Screening Referral: n/a  Additional Screening:  Hepatitis C Screening: does qualify; Completed 09/03/16  Vision Screening: Recommended annual ophthalmology exams for early detection of glaucoma and other disorders of the eye. Is the patient up to date with their annual eye exam?  Yes  Who is the provider or what is the name of the office in which the patient attends annual eye exams? [facility]  If pt is not established with a provider, would they like to be referred to a provider to establish care? No .   Dental Screening: Recommended annual dental exams for proper oral hygiene  Diabetic Foot Exam: Diabetic Foot Exam: Completed 05/15/23  Community Resource Referral / Chronic Care Management: CRR required this visit?  No   CCM required this visit?  No     Plan:     I have personally reviewed and noted the following in the patient's chart:   Medical and social history Use of alcohol, tobacco or illicit drugs  Current medications and supplements including opioid prescriptions. Patient is not currently taking opioid prescriptions. Functional ability and status Nutritional status Physical activity Advanced directives List of other physicians Hospitalizations, surgeries, and ER  visits in previous 12 months Vitals Screenings to include cognitive, depression, and falls Referrals and appointments  In addition, I have reviewed and discussed with patient certain preventive protocols, quality metrics, and best practice recommendations. A written personalized care plan for preventive services as well as general preventive health recommendations were provided to patient.     Kandis Fantasia [place], [place]   28/4/13    After Visit Summary: (MyChart) Due to this being a telephonic visit, the after visit summary with patients personalized plan was offered to patient via MyChart   Nurse Notes: No concerns at this time

## 2023-07-28 ENCOUNTER — Other Ambulatory Visit: Payer: Self-pay | Admitting: Internal Medicine

## 2023-07-28 DIAGNOSIS — B9689 Other specified bacterial agents as the cause of diseases classified elsewhere: Secondary | ICD-10-CM

## 2023-07-29 ENCOUNTER — Encounter: Payer: Self-pay | Admitting: Podiatry

## 2023-07-29 ENCOUNTER — Ambulatory Visit: Payer: 59 | Admitting: Podiatry

## 2023-07-29 ENCOUNTER — Ambulatory Visit (INDEPENDENT_AMBULATORY_CARE_PROVIDER_SITE_OTHER): Payer: 59 | Admitting: Podiatry

## 2023-07-29 DIAGNOSIS — E1151 Type 2 diabetes mellitus with diabetic peripheral angiopathy without gangrene: Secondary | ICD-10-CM

## 2023-07-29 DIAGNOSIS — M205X1 Other deformities of toe(s) (acquired), right foot: Secondary | ICD-10-CM | POA: Diagnosis not present

## 2023-07-29 DIAGNOSIS — L84 Corns and callosities: Secondary | ICD-10-CM | POA: Diagnosis not present

## 2023-07-29 DIAGNOSIS — E119 Type 2 diabetes mellitus without complications: Secondary | ICD-10-CM

## 2023-07-29 DIAGNOSIS — B351 Tinea unguium: Secondary | ICD-10-CM | POA: Diagnosis not present

## 2023-07-29 DIAGNOSIS — M79675 Pain in left toe(s): Secondary | ICD-10-CM | POA: Diagnosis not present

## 2023-07-29 DIAGNOSIS — M79674 Pain in right toe(s): Secondary | ICD-10-CM | POA: Diagnosis not present

## 2023-07-30 NOTE — Progress Notes (Signed)
Subjective:  Patient ID: Michele Wood, female    DOB: [birth year],  MRN: [id number]   Michele Wood presents to clinic today for:  Chief Complaint  Patient presents with   Callouses    Diabetic callus,right hallux and  left lateral side of foot   Patient is a pleasant 62 year old female who presents to clinic for diabetic footcare. Patient notes nails are thick, discolored, elongated and painful in shoegear when trying to ambulate.  She also complains of painful callus to the side of the right great toe and to a lesser extent the bilateral fifth met heads.  It is difficult for her to manage these areas himself Michele Wood is painful for her to ambulate.  She is unsure of her last A1c.  She has previously seen Dr. Donzetta Matters.  She is also requesting diabetic shoes.  PCP is Marcine Matar, MD.  Past Medical History:  Diagnosis Date   Cervical radiculopathy    Chronic back pain    DDD (degenerative disc disease), cervical    Depression    Diabetes mellitus without complication (HCC)    Hypertension    at age 62   Obesity    Pancreatic cyst 05/31/2019   Incidental - discovered on CT scan   s/p minimally-invasive resection of left atrial myxoma 06/08/2019   Sinusitis    Sleep apnea    at age 7    Past Surgical History:  Procedure Laterality Date   ABDOMINAL HYSTERECTOMY     BACK SURGERY  2020   lower back   BREAST BIOPSY Right 07/02/2006   [place] Core benign   EXPLORATION POST OPERATIVE OPEN HEART  2020   MINIMALLY INVASIVE EXCISION OF ATRIAL MYXOMA N/A 06/08/2019   Procedure: MINIMALLY INVASIVE RESECTION OF LEFT ATRIAL MYXOMA;  Surgeon: Purcell Nails, MD;  Location: [facility] OR;  Service: Open Heart Surgery;  Laterality: N/A;   NASAL SINUS SURGERY     TEE WITHOUT CARDIOVERSION N/A 05/17/2019   Procedure: TRANSESOPHAGEAL ECHOCARDIOGRAM (TEE);  Surgeon: Elease Hashimoto Deloris Ping, MD;  Location: [facility] ENDOSCOPY;  Service: Cardiovascular;  Laterality: N/A;   TEE WITHOUT CARDIOVERSION  N/A 06/08/2019   Procedure: TRANSESOPHAGEAL ECHOCARDIOGRAM (TEE);  Surgeon: Purcell Nails, MD;  Location: [facility] OR;  Service: Open Heart Surgery;  Laterality: N/A;    Allergies  Allergen Reactions   Codeine Sulfate Rash   Milk-Related Compounds Diarrhea and Other (See Comments)    Diarrhea and stomach upset    Review of Systems: Negative except as noted in the HPI.  Objective:  There were no vitals filed for this visit.  Michele Wood is a pleasant 62 y.o. female in NAD. AAO x 3.  Vascular Examination: Capillary refill time is 3 seconds to toes bilateral. Palpable DP pulses b/l LE, family palpable PT pulses bilaterally. Digital hair absent b/l.  Skin temperature gradient WNL b/l. No varicosities b/l. No cyanosis noted b/l.   Dermatological Examination: Pedal skin with normal turgor, texture and tone b/l. No open wounds. No interdigital macerations b/l. Toenails x10 are 3mm thick, discolored, dystrophic with subungual debris. There is pain with compression of the nail plates.  They are elongated x10  Patient also has noncompressible callus to the right first toe plantar medial IPJ region, bilateral subfifth metatarsal heads.  These were pared down without incident, no underlying wound.  Neurological Examination: Protective sensation mildly diminished, present at 8/10 sites via [facility] monofilament bilaterally.  Proprioception intact bilaterally.  Gross sensation intact.  Musculoskeletal Examination: Muscle strength 5/5 to all LE muscle groups b/l.  Decreased first MPJ range of motion right greater than left.  Approximately 25 degrees of dorsiflexion available at the right first MPJ.  Utilizes a cane for ambulation assistance     Latest Ref Rng & Units 05/15/2023    1:54 PM 09/10/2022    1:28 PM  Hemoglobin A1C  Hemoglobin-A1c 0.0 - 7.0 % 6.4  6.0      Assessment/Plan: 1. Type 2 diabetes with decreased circulation (HCC)   2. Callus   3. Hallux limitus of right  foot     No orders of the defined types were placed in this encounter.  FOR HOME USE ONLY DME DIABETIC SHOE  -The mycotic toenails were sharply debrided x10 with sterile nail nippers and a power debriding burr to decrease bulk/thickness and length.   -Debridement of hyperkeratotic tissue was performed using 312 blade without incident. -Prescription for diabetic shoes sent due to patient's diminished circulation, some neuropathy and foot deformity. -Instructed patient to continue to monitor feet daily for any signs of new wounds or areas of irritation, avoid ambulating barefoot and to continue tight glucose control. -Follow-up in 3 months for diabetic footcare.  No follow-ups on file.   Bronwen Betters, DPM [facility]     [address] [place], [place] [address]                Office [phone]  Fax [phone]

## 2023-08-13 ENCOUNTER — Telehealth: Payer: 59 | Admitting: Family Medicine

## 2023-08-13 ENCOUNTER — Ambulatory Visit: Payer: Self-pay

## 2023-08-13 ENCOUNTER — Encounter: Payer: Self-pay | Admitting: Family Medicine

## 2023-08-13 DIAGNOSIS — K529 Noninfective gastroenteritis and colitis, unspecified: Secondary | ICD-10-CM

## 2023-08-13 DIAGNOSIS — M2550 Pain in unspecified joint: Secondary | ICD-10-CM | POA: Diagnosis not present

## 2023-08-13 DIAGNOSIS — R197 Diarrhea, unspecified: Secondary | ICD-10-CM

## 2023-08-13 MED ORDER — ONDANSETRON HCL 4 MG PO TABS: 4.0000 mg | ORAL_TABLET | Freq: Three times a day (TID) | ORAL | 0 refills | Status: DC | PRN

## 2023-08-13 NOTE — Patient Instructions (Signed)
VISIT SUMMARY:  You came in today with a 2-day history of watery diarrhea, severe abdominal cramps, nausea, and other symptoms. We discussed your current symptoms and your history of diabetes and degenerative back disease. We have created a plan to address your immediate concerns and will follow up as needed.  YOUR PLAN:  -ACUTE GASTROENTERITIS: Acute gastroenteritis is an inflammation of the stomach and intestines that causes diarrhea, cramps, nausea, and other symptoms. We have prescribed Zofran to help with your nausea. Please maintain hydration by taking small sips of sugar-free electrolyte solution and broth. We will check in 24 hours to see if you need any anti-diarrheal medication. Watch for signs of dehydration, such as extreme thirst, dry mouth, or dizziness, and seek urgent care if your symptoms worsen.  -MUSCULOSKELETAL PAIN: Musculoskeletal pain refers to pain in the muscles, bones, or joints. You have reported severe aches and pains in your hips and neck, which have improved with arthritis rub. We will re-evaluate this pain once your gastroenteritis symptoms resolve.  INSTRUCTIONS:  We will check in with you in 24 hours to assess if you need anti-diarrheal medication. Please watch for signs of dehydration and seek urgent care if your symptoms worsen.

## 2023-08-13 NOTE — Telephone Encounter (Signed)
Noted  

## 2023-08-13 NOTE — Telephone Encounter (Signed)
Message from Phill Myron sent at 08/13/2023 10:47 AM EDT  Summary: diarrhea, body pain, Dizzy   Diarrhea. Body pain, Dizzy, Fever (4 days) .Marland Kitchen         Chief Complaint: diarrhea Symptoms: dizzy, nausea, body aches Frequency: 4 days  Pertinent Negatives: Patient denies vomiting, abd pain, dry mouth or decreased po fluid intake Disposition: [] ED /[] Urgent Care (no appt availability in office) / [] Appointment(In office/virtual)/ [x]  [facility]/ [] Home Care/ [] Refused Recommended Disposition /[] [place] [facility]/ []  Follow-up with PCP Additional Notes: no appts in office virtually  Reason for Disposition  [1] MODERATE diarrhea (e.g., 4-6 times / day more than normal) AND [2] present > 48 hours (2 days)  Answer Assessment - Initial Assessment Questions 1. DIARRHEA SEVERITY: "How bad is the diarrhea?" "How many more stools have you had in the past 24 hours than normal?"    - NO DIARRHEA (SCALE 0)   - MILD (SCALE 1-3): Few loose or mushy BMs; increase of 1-3 stools over normal daily number of stools; mild increase in ostomy output.   -  MODERATE (SCALE 4-7): Increase of 4-6 stools daily over normal; moderate increase in ostomy output.   -  SEVERE (SCALE 8-10; OR "WORST POSSIBLE"): Increase of 7 or more stools daily over normal; moderate increase in ostomy output; incontinence.     moderate 2. ONSET: "When did the diarrhea begin?"      4 days ago  3. BM CONSISTENCY: "How loose or watery is the diarrhea?"      loose 4. VOMITING: "Are you also vomiting?" If Yes, ask: "How many times in the past 24 hours?"      no 5. ABDOMEN PAIN: "Are you having any abdomen pain?" If Yes, ask: "What does it feel like?" (e.g., crampy, dull, intermittent, constant)      no 6. ABDOMEN PAIN SEVERITY: If present, ask: "How bad is the pain?"  (e.g., Scale 1-10; mild, moderate, or severe)   - MILD (1-3): doesn't interfere with normal activities, abdomen soft and not tender to touch    -  MODERATE (4-7): interferes with normal activities or awakens from sleep, abdomen tender to touch    - SEVERE (8-10): excruciating pain, doubled over, unable to do any normal activities       No one 7. ORAL INTAKE: If vomiting, "Have you been able to drink liquids?" "How much liquids have you had in the past 24 hours?"     Yes  8. HYDRATION: "Any signs of dehydration?" (e.g., dry mouth [not just dry lips], too weak to stand, dizziness, new weight loss) "When did you last urinate?"     Urinating without problem  9. EXPOSURE: "Have you traveled to a foreign country recently?" "Have you been exposed to anyone with diarrhea?" "Could you have eaten any food that was spoiled?"     N/a 10. ANTIBIOTIC USE: "Are you taking antibiotics now or have you taken antibiotics in the past 2 months?"       no 11. OTHER SYMPTOMS: "Do you have any other symptoms?" (e.g., fever, blood in stool)       Body aches, dizzy, fever, hot and cold flashes, nausea 12. PREGNANCY: "Is there any chance you are pregnant?" "When was your last menstrual period?"       N/a  Protocols used: [facility]

## 2023-08-13 NOTE — Progress Notes (Signed)
Inability to connect with patient. Unable to hear on video. Tried several times. Called- but it went straight to voicemail- possibly due to being on video. Will try to have a new appt made for them.   [place]

## 2023-08-13 NOTE — Progress Notes (Signed)
Virtual Visit via Video Note  I connected with Michele Wood, on 08/13/2023 at 1:33 PM by video enabled telemedicine device and verified that I am speaking with the correct person using two identifiers.   Consent: I discussed the limitations, risks, security and privacy concerns of performing an evaluation and management service by telemedicine and the availability of in person appointments. I also discussed with the patient that there may be a patient responsible charge related to this service. The patient expressed understanding and agreed to proceed.   Location of Patient: Home  Location of Provider: Clinic   Persons participating in Telemedicine visit: Michele Wood Dr. Alvis Lemmings     History of Present Illness: Michele Wood is a 62 y.o. year old female patient of Dr. Laural Benes with history of type 2 diabetes mellitus, hypertension, hyperlipidemia who presents for an acute visit.  Discussed the use of AI scribe software for clinical note transcription with the patient, who gave verbal consent to proceed.  She presents with a 2-day history of diarrhea. She describes the diarrhea as watery and frequent, occurring three times overnight and three to four times. She denies blood in the stool. Accompanying symptoms include severe abdominal cramps, nausea, and a sensation of wanting to vomit but being unable to. She also reports feeling lightheaded and experiencing chills but no fever.. Prior to the onset of diarrhea, she experienced severe aches and pains, particularly in the hips and neck. She denies eating out or consuming any unusual foods and reports that no one else in her household is experiencing similar symptoms.        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  E11.69, E66.01 100 each 12   acetaminophen (TYLENOL 8 HOUR) 650 MG CR tablet Take 1 tablet (650 mg total) by mouth every 8 (eight) hours as needed for pain or fever. 30 tablet 0   amLODipine (NORVASC) 10 MG tablet TAKE 1 TABLET BY MOUTH EVERY DAY 90 tablet 1   aspirin (ASPIRIN LOW DOSE) 81 MG EC tablet Take 1 tablet (81 mg total) by mouth daily. Swallow whole. 90 tablet 1   atorvastatin (LIPITOR) 40 MG tablet TAKE 1 TABLET BY MOUTH EVERY DAY 90 tablet 1   Blood Glucose Monitoring Suppl (ACCU-CHEK GUIDE) w/Device KIT Check blood sugars once a day. E11.69, E66.01 1 kit 0   Cholecalciferol (VITAMIN D3) 2000 UNITS TABS Take 2,000 Units by mouth daily. 30 tablet 11   diclofenac Sodium (VOLTAREN) 1 % GEL Apply 2 g topically 4 (four) times daily. 100 g 0   fluticasone (FLONASE) 50 MCG/ACT nasal spray SPRAY 2 SPRAYS INTO EACH NOSTRIL EVERY DAY 48 mL 0   gabapentin (  NEURONTIN) 300 MG capsule Take 1 capsule (300 mg total) by mouth at bedtime. For nerve pain 90 capsule 1   glucose blood (ACCU-CHEK GUIDE) test strip Use as instructed to check blood sugars once a day.  He 11.69, E66.01 100 each 12   hydrochlorothiazide (HYDRODIURIL) 25 MG tablet Take 1 tablet (25 mg total) by mouth daily. 90 tablet 3   hydrOXYzine (VISTARIL) 25 MG capsule TAKE 1 CAPSULE (25 MG TOTAL) BY MOUTH AT BEDTIME AS NEEDED FOR ANXIETY. 90 capsule 1   Lurasidone HCl 60 MG TABS Take 1 tablet (60 mg total) by mouth daily with breakfast. 30  tablet 2   metFORMIN (GLUCOPHAGE) 500 MG tablet 1 tablet p.o. twice a day 180 tablet 1   metoprolol tartrate (LOPRESSOR) 50 MG tablet TAKE 1 TABLET BY MOUTH TWICE A DAY 180 tablet 1   montelukast (SINGULAIR) 10 MG tablet TAKE 1 TABLET BY MOUTH EVERY DAY 90 tablet 1   valsartan (DIOVAN) 80 MG tablet TAKE 1 TABLET (80 MG TOTAL) BY MOUTH DAILY. FOR BLOOD PRESSURE 90 tablet 1   No current facility-administered medications on file prior to visit.    ROS: See HPI  Observations/Objective: Awake, alert, oriented x3 Appears well-hydrated, no evidence of lethargy Not in acute distress Normal mood      Latest Ref Rng & Units 05/15/2023    2:24 PM 12/05/2021   10:25 AM 08/18/2020   11:54 AM  CMP  Glucose 70 - 99 mg/dL 161  85  76   BUN 8 - 27 mg/dL 17  13  9    Creatinine 0.57 - 1.00 mg/dL 0.96  0.45  4.09   Sodium 134 - 144 mmol/L 141  141  138   Potassium 3.5 - 5.2 mmol/L 3.9  4.2  4.5   Chloride 96 - 106 mmol/L 101  105  100   CO2 20 - 29 mmol/L 25  23  27    Calcium 8.7 - 10.3 mg/dL 9.6  9.9  9.3   Total Protein 6.0 - 8.5 g/dL 6.9  7.3  7.5   Total Bilirubin 0.0 - 1.2 mg/dL 0.3  0.4  0.4   Alkaline Phos 44 - 121 IU/L 134  133  108   AST 0 - 40 IU/L 20  16  18    ALT 0 - 32 IU/L 17  17  18      Lipid Panel     Component Value Date/Time   CHOL 163 05/15/2023 1424   TRIG 316 (H) 05/15/2023 1424   HDL 34 (L) 05/15/2023 1424   CHOLHDL 4.8 (H) 05/15/2023 1424   CHOLHDL 6.1 10/26/2013 0949   VLDL 36 10/26/2013 0949   LDLCALC 78 05/15/2023 1424   LABVLDL 51 (H) 05/15/2023 1424    Lab Results  Component Value Date   HGBA1C 6.4 05/15/2023     Assessment and Plan:    Acute Gastroenteritis Diarrhea for 2 days, watery, no blood, associated with nausea, abdominal cramps, and chills. No vomiting. No recent eating out or similar symptoms in household contacts. Risk of dehydration due to frequent diarrhea and inability to hold down fluids. -Prescribe Zofran for nausea. -Advise on  maintaining hydration with small sips of sugar-free electrolyte solution and broth. -Check in 24 hours to assess need for anti-diarrheal medication. -Advise on signs of dehydration and need for urgent care visit if symptoms worsen.  Musculoskeletal Pain Reports of severe aches and pains in hips and neck prior to onset of gastroenteritis. History of degenerative back disease.  Pain improved with arthritis rub and less noticeable with onset of gastroenteritis symptoms. -No change in management at this time. Re-evaluate once acute gastroenteritis resolves.        Follow Up Instructions: Keep previously scheduled appointment   I discussed the assessment and treatment plan with the patient. The patient was provided an opportunity to ask questions and all were answered. The patient agreed with the plan and demonstrated an understanding of the instructions.   The patient was advised to call back or seek an in-person evaluation if the symptoms worsen or if the condition fails to improve as anticipated.     I provided 12 minutes total of Telehealth time during this encounter including median intraservice time, reviewing previous notes, investigations, ordering medications, medical decision making, coordinating care and patient verbalized understanding at the end of the visit.     Hoy Register, MD, FAAFP. [facility] [place], [place] [phone]   08/13/2023, 1:33 PM

## 2023-08-13 NOTE — Telephone Encounter (Signed)
   Disposition: [] ED /[] Urgent Care (no appt availability in office) / [x] Appointment(In office/virtual)/ []  [facility]/ [] Home Care/ [] Refused Recommended Disposition /[] [facility]/ []  Follow-up with PCP Additional Notes:  Pt called stating her last call did not work- made appt with office and assisted pt to turn on microphoneto be able to have appt. Pt has a an android smart phone Reason for Disposition  [1] Follow-up call to recent contact AND [2] information only call, no triage required  Protocols used: Information Only Call - No Triage-A-AH

## 2023-08-16 ENCOUNTER — Emergency Department (HOSPITAL_COMMUNITY): Payer: 59

## 2023-08-16 ENCOUNTER — Other Ambulatory Visit: Payer: Self-pay

## 2023-08-16 ENCOUNTER — Emergency Department (HOSPITAL_COMMUNITY)
Admission: EM | Admit: 2023-08-16 | Discharge: 2023-08-16 | Disposition: A | Discharge status: Home / Self Care | Payer: 59 | Attending: Emergency Medicine | Admitting: Emergency Medicine

## 2023-08-16 DIAGNOSIS — R109 Unspecified abdominal pain: Secondary | ICD-10-CM | POA: Insufficient documentation

## 2023-08-16 DIAGNOSIS — R0602 Shortness of breath: Secondary | ICD-10-CM | POA: Diagnosis not present

## 2023-08-16 DIAGNOSIS — R0789 Other chest pain: Secondary | ICD-10-CM | POA: Diagnosis not present

## 2023-08-16 DIAGNOSIS — R791 Abnormal coagulation profile: Secondary | ICD-10-CM | POA: Diagnosis not present

## 2023-08-16 DIAGNOSIS — I2510 Atherosclerotic heart disease of native coronary artery without angina pectoris: Secondary | ICD-10-CM | POA: Diagnosis not present

## 2023-08-16 DIAGNOSIS — J984 Other disorders of lung: Secondary | ICD-10-CM | POA: Diagnosis not present

## 2023-08-16 DIAGNOSIS — J189 Pneumonia, unspecified organism: Secondary | ICD-10-CM | POA: Diagnosis not present

## 2023-08-16 DIAGNOSIS — Z7982 Long term (current) use of aspirin: Secondary | ICD-10-CM | POA: Insufficient documentation

## 2023-08-16 DIAGNOSIS — M545 Low back pain, unspecified: Secondary | ICD-10-CM | POA: Diagnosis not present

## 2023-08-16 DIAGNOSIS — R0781 Pleurodynia: Secondary | ICD-10-CM | POA: Diagnosis not present

## 2023-08-16 DIAGNOSIS — R197 Diarrhea, unspecified: Secondary | ICD-10-CM | POA: Diagnosis not present

## 2023-08-16 DIAGNOSIS — E669 Obesity, unspecified: Secondary | ICD-10-CM | POA: Diagnosis not present

## 2023-08-16 DIAGNOSIS — K5730 Diverticulosis of large intestine without perforation or abscess without bleeding: Secondary | ICD-10-CM | POA: Diagnosis not present

## 2023-08-16 DIAGNOSIS — K429 Umbilical hernia without obstruction or gangrene: Secondary | ICD-10-CM | POA: Diagnosis not present

## 2023-08-16 DIAGNOSIS — R079 Chest pain, unspecified: Secondary | ICD-10-CM | POA: Diagnosis not present

## 2023-08-16 DIAGNOSIS — I517 Cardiomegaly: Secondary | ICD-10-CM | POA: Diagnosis not present

## 2023-08-16 DIAGNOSIS — R918 Other nonspecific abnormal finding of lung field: Secondary | ICD-10-CM | POA: Diagnosis not present

## 2023-08-16 DIAGNOSIS — K449 Diaphragmatic hernia without obstruction or gangrene: Secondary | ICD-10-CM | POA: Diagnosis not present

## 2023-08-16 DIAGNOSIS — I213 ST elevation (STEMI) myocardial infarction of unspecified site: Secondary | ICD-10-CM | POA: Diagnosis not present

## 2023-08-16 DIAGNOSIS — M542 Cervicalgia: Secondary | ICD-10-CM | POA: Diagnosis not present

## 2023-08-16 DIAGNOSIS — Z6841 Body Mass Index (BMI) 40.0 and over, adult: Secondary | ICD-10-CM | POA: Diagnosis not present

## 2023-08-16 DIAGNOSIS — J90 Pleural effusion, not elsewhere classified: Secondary | ICD-10-CM | POA: Diagnosis not present

## 2023-08-16 LAB — URINALYSIS, ROUTINE W REFLEX MICROSCOPIC
Bilirubin Urine: NEGATIVE
Glucose, UA: NEGATIVE mg/dL
Hgb urine dipstick: NEGATIVE
Ketones, ur: NEGATIVE mg/dL
Leukocytes,Ua: NEGATIVE
Nitrite: NEGATIVE
Protein, ur: NEGATIVE mg/dL
Specific Gravity, Urine: 1.012 (ref 1.005–1.030)
pH: 5 (ref 5.0–8.0)

## 2023-08-16 LAB — COMPREHENSIVE METABOLIC PANEL
ALT: 12 U/L (ref 0–44)
AST: 17 U/L (ref 15–41)
Albumin: 2.3 g/dL — ABNORMAL LOW (ref 3.5–5.0)
Alkaline Phosphatase: 62 U/L (ref 38–126)
Anion gap: 8 (ref 5–15)
BUN: 6 mg/dL — ABNORMAL LOW (ref 8–23)
CO2: 17 mmol/L — ABNORMAL LOW (ref 22–32)
Calcium: 7 mg/dL — ABNORMAL LOW (ref 8.9–10.3)
Chloride: 117 mmol/L — ABNORMAL HIGH (ref 98–111)
Creatinine, Ser: 0.6 mg/dL (ref 0.44–1.00)
GFR, Estimated: >60 mL/min (ref >60)
Glucose, Bld: 79 mg/dL (ref 70–99)
Potassium: 3.4 mmol/L — ABNORMAL LOW (ref 3.5–5.1)
Sodium: 142 mmol/L (ref 135–145)
Total Bilirubin: 0.7 mg/dL (ref 0.3–1.2)
Total Protein: 5.2 g/dL — ABNORMAL LOW (ref 6.5–8.1)

## 2023-08-16 LAB — CBC WITH DIFFERENTIAL/PLATELET
Abs Immature Granulocytes: 0.03 10*3/uL (ref 0.00–0.07)
Basophils Absolute: 0 10*3/uL (ref 0.0–0.1)
Basophils Relative: 0 %
Eosinophils Absolute: 0.1 10*3/uL (ref 0.0–0.5)
Eosinophils Relative: 1 %
HCT: 33.9 % — ABNORMAL LOW (ref 36.0–46.0)
Hemoglobin: 10.6 g/dL — ABNORMAL LOW (ref 12.0–15.0)
Immature Granulocytes: 0 %
Lymphocytes Relative: 18 %
Lymphs Abs: 1.5 10*3/uL (ref 0.7–4.0)
MCH: 26.7 pg (ref 26.0–34.0)
MCHC: 31.3 g/dL (ref 30.0–36.0)
MCV: 85.4 fL (ref 80.0–100.0)
Monocytes Absolute: 0.5 10*3/uL (ref 0.1–1.0)
Monocytes Relative: 6 %
Neutro Abs: 5.8 10*3/uL (ref 1.7–7.7)
Neutrophils Relative %: 75 %
Platelets: 272 10*3/uL (ref 150–400)
RBC: 3.97 MIL/uL (ref 3.87–5.11)
RDW: 15 % (ref 11.5–15.5)
WBC: 7.9 10*3/uL (ref 4.0–10.5)
nRBC: 0 % (ref 0.0–0.2)

## 2023-08-16 LAB — TROPONIN I (HIGH SENSITIVITY)
Troponin I (High Sensitivity): 5 ng/L (ref <18)
Troponin I (High Sensitivity): 3 ng/L (ref <18)

## 2023-08-16 LAB — D-DIMER, QUANTITATIVE: D-Dimer, Quant: 0.55 ug{FEU}/mL — ABNORMAL HIGH (ref 0.00–0.50)

## 2023-08-16 LAB — LIPASE, BLOOD: Lipase: 19 U/L (ref 11–51)

## 2023-08-16 MED ORDER — IOHEXOL 350 MG/ML SOLN
100.0000 mL | Freq: Once | INTRAVENOUS | Status: AC | PRN
  Administered 2023-08-16: 100 mL via INTRAVENOUS

## 2023-08-16 MED ORDER — CEFTRIAXONE SODIUM 1 G IJ SOLR
1.0000 g | Freq: Once | INTRAMUSCULAR | Status: AC
  Administered 2023-08-16: 1 g via INTRAVENOUS
  Filled 2023-08-16: qty 10

## 2023-08-16 MED ORDER — DOXYCYCLINE HYCLATE 100 MG PO TABS
100.0000 mg | ORAL_TABLET | Freq: Once | ORAL | Status: AC
  Administered 2023-08-16: 100 mg via ORAL
  Filled 2023-08-16: qty 1

## 2023-08-16 MED ORDER — DOXYCYCLINE HYCLATE 100 MG PO CAPS
100.0000 mg | ORAL_CAPSULE | Freq: Two times a day (BID) | ORAL | 0 refills | Status: AC
Start: 2023-08-17 — End: 2023-08-23

## 2023-08-16 NOTE — ED Triage Notes (Signed)
At 4am pt woke up with left lower back pain. Pain began to radiate to central chest as the day progressed. Pain 3/10 but worse with movement and deep breaths. Pt received 324 asa and .4 nitroglycerin with ems

## 2023-08-16 NOTE — Discharge Instructions (Signed)
Your workup in the ER today included CAT scans and blood test.  Your CT scan showed that you may have an infection of your lungs.  You were given antibiotics in the ER.  You are also prescribed an additional 6 days of oral antibiotics to take at home, beginning tomorrow.  This may be the cause of your chest pain.    We discussed that the CT scan of your abdomen showed that you have some potential inflammation around your lumbar spine.  This is around the L5-S1 region where you have had surgery in the past.  We talked about the need for likely MRI to better look at this part of your back.  You preferred to do this as an outpatient.  I thought this was reasonable, since your symptoms and back pain have been stable for nearly a year.    I recommend that you reach out to [facility] neurosurgery to reestablish care as a new patient with their clinic.  You will need to make an appointment in their clinic.  They may be able to order you an MRI after your first appointment.  If there is a delay in seeing the neurosurgeons in the clinic, you can also contact your primary care provider's office to discuss getting an MRI of your lumbar spine with and without contrast.  Finally, if you do begin experiencing numbness, loss of feeling in your legs, incontinence or loss of control of your bladder or bowel, severe headaches, fevers, particularly with worsening lower back pain, please return immediately to the hospital.  These may be signs of an infection around your spine or other problems with your spine.

## 2023-08-16 NOTE — ED Provider Notes (Signed)
[place] EMERGENCY DEPARTMENT AT [facility] Provider Note   CSN: [id number] Arrival date & time: 08/16/23  6387     History  Chief Complaint  Patient presents with   Chest Pain   Back Pain    Michele Wood is a 62 y.o. female with history obesity, chronic neck pain, high blood pressure, high cholesterol, atrial myxoma, presenting to the ED with complaint of chest pain and abdominal pain.  Patient reports that she had a "viral stomach bug" earlier this week with nausea and diarrhea.  This was diagnosed by her doctor based on her symptoms.  She reports that she woke up earlier this morning around 4 AM with pain in the left lower side of her back, intermittently feeling this in her shoulder, and now into her left upper abdomen.  She reports it is worse movement and deep inspiration.  She says she has never had pain like this before.  She denies lightheadedness, persistent cough, shortness of breath.  EMS give the patient full dose aspirin and nitroglycerin en route to the hospital with no significant improvement or change of her symptoms.  She reports a continues to have some loose stools this week.  Patient had a coronary CT scan in April 2020 per my review of external records.  She had a coronary calcium score of 26 which is 80th percentile for age and sex matched controlled.  She had minimally nonobstructive CAD.  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    HPI     Home Medications Prior to Admission medications   Medication Sig Start Date End Date Taking? Authorizing Provider  Accu-Chek Softclix Lancets lancets Use as instructed to check blood sugars once a day.  E11.69, E66.01 01/10/23   Marcine Matar, MD  acetaminophen (TYLENOL 8 HOUR) 650 MG CR tablet Take 1 tablet (650 mg total) by mouth every 8 (eight) hours as needed for pain or fever. 01/30/21   Derwood Kaplan, MD  amLODipine (NORVASC) 10 MG tablet TAKE 1  TABLET BY MOUTH EVERY DAY 06/09/23   Marcine Matar, MD  aspirin (ASPIRIN LOW DOSE) 81 MG EC tablet Take 1 tablet (81 mg total) by mouth daily. Swallow whole. 12/05/21   Anders Simmonds, PA-C  atorvastatin (LIPITOR) 40 MG tablet TAKE 1 TABLET BY MOUTH EVERY DAY 06/11/23   Marcine Matar, MD  Blood Glucose Monitoring Suppl (ACCU-CHEK GUIDE) w/Device KIT Check blood sugars once a day. E11.69, E66.01 01/10/23   Marcine Matar, MD  Cholecalciferol (VITAMIN D3) 2000 UNITS TABS Take 2,000 Units by mouth daily. 09/19/15   Funches, Gerilyn Nestle, MD  diclofenac Sodium (VOLTAREN) 1 % GEL Apply 2 g topically 4 (four) times daily. 06/18/22   Cecil Cobbs, PA-C  fluticasone [facility]) 50 MCG/ACT nasal spray SPRAY 2 SPRAYS INTO EACH NOSTRIL EVERY DAY 07/28/23   Marcine Matar, MD  gabapentin (NEURONTIN) 300 MG capsule Take 1 capsule (300 mg total) by mouth at bedtime. For nerve pain 12/05/21   Georgian Co M, PA-C  glucose blood (ACCU-CHEK GUIDE) test strip Use as instructed to check blood sugars once a day.  He 11.69, E66.01 01/10/23   Marcine Matar, MD  hydrochlorothiazide (HYDRODIURIL) 25 MG tablet Take 1 tablet (25 mg total) by mouth daily. 01/10/23   Marcine Matar, MD  hydrOXYzine (VISTARIL) 25 MG capsule TAKE 1 CAPSULE (25 MG TOTAL) BY MOUTH AT BEDTIME AS NEEDED FOR ANXIETY. 12/27/21  Marcine Matar, MD  Lurasidone HCl 60 MG TABS Take 1 tablet (60 mg total) by mouth daily with breakfast. 02/01/21   Arfeen, Phillips Grout, MD  metFORMIN (GLUCOPHAGE) 500 MG tablet 1 tablet p.o. twice a day 01/10/23   Marcine Matar, MD  metoprolol tartrate (LOPRESSOR) 50 MG tablet TAKE 1 TABLET BY MOUTH TWICE A DAY 06/09/23   Marcine Matar, MD  montelukast (SINGULAIR) 10 MG tablet TAKE 1 TABLET BY MOUTH EVERY DAY 06/09/23   Marcine Matar, MD  ondansetron (ZOFRAN) 4 MG tablet Take 1 tablet (4 mg total) by mouth every 8 (eight) hours as needed for nausea or vomiting. 08/13/23   Hoy Register, MD   valsartan (DIOVAN) 80 MG tablet TAKE 1 TABLET (80 MG TOTAL) BY MOUTH DAILY. FOR BLOOD PRESSURE 05/27/23   Marcine Matar, MD      Allergies    Codeine sulfate and Milk-related compounds    Review of Systems   Review of Systems  Physical Exam Updated Vital Signs BP 124/84 (BP Location: Right Arm)   Pulse (!) 104   Temp 98.4 F (36.9 C) (Oral)   Resp 16   Ht 5\' 4"  (1.626 m)   Wt 108.9 kg   LMP 09/01/2013   SpO2 96%   BMI 41.20 kg/m  Physical Exam Constitutional:      General: She is not in acute distress.    Appearance: She is obese.  HENT:     Head: Normocephalic and atraumatic.  Eyes:     Conjunctiva/sclera: Conjunctivae normal.     Pupils: Pupils are equal, round, and reactive to light.  Cardiovascular:     Rate and Rhythm: Normal rate and regular rhythm.  Pulmonary:     Effort: Pulmonary effort is normal. No respiratory distress.  Abdominal:     General: There is no distension.     Tenderness: There is no abdominal tenderness.  Skin:    General: Skin is warm and dry.  Neurological:     General: No focal deficit present.     Mental Status: She is alert. Mental status is at baseline.  Psychiatric:        Mood and Affect: Mood normal.        Behavior: Behavior normal.     ED Results / Procedures / Treatments   Labs (all labs ordered are listed, but only abnormal results are displayed) Labs Reviewed  COMPREHENSIVE METABOLIC PANEL - Abnormal; Notable for the following components:      Result Value   Potassium 3.4 (*)    Chloride 117 (*)    CO2 17 (*)    BUN 6 (*)    Calcium 7.0 (*)    Total Protein 5.2 (*)    Albumin 2.3 (*)    All other components within normal limits  CBC WITH DIFFERENTIAL/PLATELET - Abnormal; Notable for the following components:   Hemoglobin 10.6 (*)    HCT 33.9 (*)    All other components within normal limits  D-DIMER, QUANTITATIVE - Abnormal; Notable for the following components:   D-Dimer, Quant 0.55 (*)    All other  components within normal limits  LIPASE, BLOOD  URINALYSIS, ROUTINE W REFLEX MICROSCOPIC  TROPONIN I (HIGH SENSITIVITY)  TROPONIN I (HIGH SENSITIVITY)    EKG EKG Interpretation Date/Time:  Saturday August 16 2023 08:30:15 EDT Ventricular Rate:  87 PR Interval:  157 QRS Duration:  138 QT Interval:  405 QTC Calculation: 488 R Axis:   -57  Text Interpretation:  Sinus rhythm Left bundle branch block Confirmed by Alvester Chou [phone]) on 08/16/2023 8:39:33 AM  Radiology CT ABDOMEN PELVIS W CONTRAST  Result Date: 08/16/2023 CLINICAL DATA:  Diverticulitis, complications suspected. EXAM: CT ABDOMEN AND PELVIS WITH CONTRAST TECHNIQUE: Multidetector CT imaging of the abdomen and pelvis was performed using the standard protocol following bolus administration of intravenous contrast. RADIATION DOSE REDUCTION: This exam was performed according to the departmental dose-optimization program which includes automated exposure control, adjustment of the mA and/or kV according to patient size and/or use of iterative reconstruction technique. CONTRAST:  OMNIPAQUE IOHEXOL 350 MG/ML SOLN COMPARISON:  Abdominopelvic CTA 05/31/2019. CT lumbar spine 01/18/2021. FINDINGS: Lower chest:  Dictated separately. Hepatobiliary: The liver is normal in density without suspicious focal abnormality. No evidence of gallstones, gallbladder wall thickening or biliary dilatation. Pancreas: Fatty replacement within the pancreatic head. No suspicious focal lesion, pancreatic ductal dilatation or surrounding inflammation. Spleen: Normal in size without focal abnormality. Adrenals/Urinary Tract: Both adrenal glands appear normal. No evidence of urinary tract calculus, suspicious renal lesion or hydronephrosis. The bladder appears unremarkable for its degree of distention. Stomach/Bowel: No enteric contrast administered. Small hiatal hernia. The stomach otherwise appears unremarkable for its degree of distention. No evidence of  bowel distension, wall thickening or surrounding inflammation. The appendix appears normal. There are diverticular changes within the descending and sigmoid colon without evidence of acute inflammation. Vascular/Lymphatic: There are no enlarged abdominal or pelvic lymph nodes. Aortic and branch vessel atherosclerosis without evidence of aneurysm large vessel occlusion. Reproductive: Hysterectomy. The adnexal regions appear stable, without suspicious findings. Other: Stable small umbilical hernia containing only fat. No ascites, focal fluid collection or pneumoperitoneum. Musculoskeletal: No acute or significant osseous findings. Stable postsurgical changes from L3-5 PLIF. Progressive endplate degenerative changes at L5-S1 with endplate sclerosis and irregularity. No surrounding soft tissue inflammatory changes to strongly suggest discitis/osteomyelitis. Unless specific follow-up recommendations are mentioned in the findings or impression sections, no imaging follow-up of any mentioned incidental findings is recommended. IMPRESSION: 1. No acute findings or explanation for the patient's symptoms. 2. Colonic diverticulosis without evidence of acute inflammation. 3. Progressive endplate degenerative changes at L5-S1 without surrounding soft tissue inflammatory changes to strongly suggest discitis/osteomyelitis. Correlate clinically. That could be further evaluated with lumbar MRI if clinically warranted. 4.  Aortic Atherosclerosis (ICD10-I70.0). 5. Chest CT findings dictated separately. Electronically Signed   By: Carey Bullocks M.D.   On: 08/16/2023 12:50   CT Angio Chest PE W and/or Wo Contrast  Result Date: 08/16/2023 CLINICAL DATA:  Left-sided pleuritic chest pain. EXAM: CT ANGIOGRAPHY CHEST WITH CONTRAST TECHNIQUE: Multidetector CT imaging of the chest was performed using the standard protocol during bolus administration of intravenous contrast. Multiplanar CT image reconstructions and MIPs were obtained to  evaluate the vascular anatomy. RADIATION DOSE REDUCTION: This exam was performed according to the departmental dose-optimization program which includes automated exposure control, adjustment of the mA and/or kV according to patient size and/or use of iterative reconstruction technique. CONTRAST:  OMNIPAQUE IOHEXOL 350 MG/ML SOLN COMPARISON:  Chest x-ray earlier 08/16/2023. CT angiogram 05/31/2019. FINDINGS: Cardiovascular: No significant pericardial effusion. The heart enlarged. Coronary artery calcifications are seen. There is extensive breathing motion identified. This significantly limits evaluation for pulmonary emboli, nondiagnostic for small and peripheral emboli. No large or central embolus identified. Mediastinum/Nodes: Small hiatal hernia. Slightly patulous thoracic esophagus. Preserved thyroid gland. No specific abnormal lymph node enlargement identified in the axillary regions, hilum or mediastinum. Lungs/Pleura: No pneumothorax or effusion. Extensive breathing motion. There is some  patchy ground-glass areas bilaterally, nonspecific. Possible areas of air trapping or subtle areas of infectious or inflammatory process. Trace pleural fluid. No pneumothorax. Upper Abdomen: The adrenal glands are not included in the imaging field in the upper abdomen. Please see separate dictation of abdomen and pelvis CT. Musculoskeletal: Scattered degenerative changes of the spine. Review of the MIP images confirms the above findings. IMPRESSION: Extensive breathing motion.  No large or central embolus. Enlarged heart. Trace pleural fluid with some scattered mild areas of patchy ground-glass in the lungs. Nonspecific. Please correlate with clinical presentation and recommend follow-up Aortic Atherosclerosis (ICD10-I70.0). Electronically Signed   By: Karen Kays M.D.   On: 08/16/2023 12:43   DG Chest 2 View  Result Date: 08/16/2023 CLINICAL DATA:  Chest pain and shortness of breath EXAM: CHEST - 2 VIEW COMPARISON:   Chest x-ray dated June 22, 2023 FINDINGS: Cardiomegaly. Mild interstitial opacities. No evidence of pleural effusion or pneumothorax. Multilevel moderate to severe degenerative disc disease of the thoracic spine. IMPRESSION: Cardiomegaly. Mild interstitial opacities, likely due to pulmonary edema. Electronically Signed   By: Allegra Lai M.D.   On: 08/16/2023 09:56    Procedures Procedures    Medications Ordered in ED Medications  cefTRIAXone (ROCEPHIN) 1 g in sodium chloride 0.9 % 100 mL IVPB (has no administration in time range)  doxycycline (VIBRA-TABS) tablet 100 mg (has no administration in time range)  iohexol (OMNIPAQUE) 350 MG/ML injection 100 mL (100 mLs Intravenous Contrast Given 08/16/23 1220)    ED Course/ Medical Decision Making/ A&P                                 Medical Decision Making Amount and/or Complexity of Data Reviewed Labs: ordered. Radiology: ordered.  Risk Prescription drug management.   This patient presents to the ED with concern for pleuritic left-sided chest pain. This involves an extensive number of treatment options, and is a complaint that carries with it a high risk of complications and morbidity.  The differential diagnosis includes pneumonia versus thorax versus PE versus referred intra-abdominal pain or diverticulitis versus other  I ordered and personally interpreted labs.  The pertinent results include: No emergent findings  I ordered imaging studies including x-ray of the chest, CT PE study, CT angio of the abdomen I independently visualized and interpreted imaging which showed potential infectious pattern of the lungs, atypical; potential inflammatory findings located around the L5-S1 sacral region where patient has had prior instrumentation of her lumbar spine I agree with the radiologist interpretation  The patient was maintained on a cardiac monitor.  I personally viewed and interpreted the cardiac monitored which showed an  underlying rhythm of: Mild sinus tachycardia and sinus rhythm  Per my interpretation the patient's ECG shows no acute ischemic findings  I ordered medication including IV Rocephin and doxycycline for community pneumonia  I have reviewed the patients home medicines and have made adjustments as needed  Test Considered:   I did have a discussion with the patient about the options of performing MRI of the lumbar spine here in the ED to better delineate the lesion that is noted on her CT scan.  The patient reports she has been having lower back pain ongoing for about a year, worse with ambulation.  She denies any numbness or weakness of the legs, but times gets radiculopathy in the legs.  She is not having any active significant lower back pain, erythema, and  have a low suspicion that this lesion is a source of her current symptoms.  I doubt this is an active infection, more likely reactive from prior surgery.  The patient preferred to pursue MRI as an outpatient, either through her PCPs office, or through neurosurgery.  She will be contacting [facility] to reestablish care as she was formerly seeing a neurosurgeon who has since retired or left.  If there is a delay in being seen in the office, she will contact her PCPs office to discuss an MRI of the lower back.  This was delineated in her discharge instructions.  Return precautions were also provided.  Both the patient and her husband were present agree of this plan.  After the interventions noted above, I reevaluated the patient and found that they have: stayed the same   Dispostion:  This time the patient's vital signs are stable.  She is not hypoxic.  She does not demonstrate evidence of sepsis.  I do not believe she is requiring hospitalization.  I think this is reasonable to treat as an outpatient for community pneumonia.  The patient is in agreement and wanting to go home.  After consideration of the diagnostic results and  the patients response to treatment, I feel that the patent would benefit from close outpatient follow-up.         Final Clinical Impression(s) / ED Diagnoses Final diagnoses:  Pneumonia due to infectious organism, unspecified laterality, unspecified part of lung    Rx / DC Orders ED Discharge Orders     None         Terald Sleeper, MD 08/16/23 1417

## 2023-08-18 ENCOUNTER — Ambulatory Visit: Payer: 59

## 2023-08-18 NOTE — Progress Notes (Signed)
Patient presents to the office today for diabetic shoe and insole measuring.  Patient was measured with brannock device to determine size and width for 1 pair of extra depth shoes and foam casted for 3 pair of insoles.   Documentation of medical necessity will be sent to patient's treating diabetic doctor to verify and sign.   Patient's diabetic provider: Jonah Blue MD  Shoes and insoles will be ordered at that time and patient will be notified for an appointment for fitting when they arrive.   Shoe size (per patient): 8-8.5 Brannock measurement: 8 Patient shoe selection- Shoe choice:   [id number] black Anodyne Shoe size ordered: 8.5WD / Not submitted yet on Website  Addison Bailey CPed, CFo, CFm

## 2023-08-26 ENCOUNTER — Other Ambulatory Visit: Payer: Self-pay | Admitting: Internal Medicine

## 2023-08-26 DIAGNOSIS — B9689 Other specified bacterial agents as the cause of diseases classified elsewhere: Secondary | ICD-10-CM

## 2023-08-26 DIAGNOSIS — F4310 Post-traumatic stress disorder, unspecified: Secondary | ICD-10-CM

## 2023-08-26 DIAGNOSIS — F331 Major depressive disorder, recurrent, moderate: Secondary | ICD-10-CM

## 2023-08-27 ENCOUNTER — Encounter: Payer: Self-pay | Admitting: Physician Assistant

## 2023-08-27 ENCOUNTER — Ambulatory Visit: Payer: 59 | Attending: Physician Assistant | Admitting: Physician Assistant

## 2023-08-27 VITALS — BP 118/84 | HR 65 | Wt 255.0 lb

## 2023-08-27 DIAGNOSIS — Z09 Encounter for follow-up examination after completed treatment for conditions other than malignant neoplasm: Secondary | ICD-10-CM

## 2023-08-27 DIAGNOSIS — R937 Abnormal findings on diagnostic imaging of other parts of musculoskeletal system: Secondary | ICD-10-CM

## 2023-08-27 DIAGNOSIS — M25552 Pain in left hip: Secondary | ICD-10-CM

## 2023-08-27 DIAGNOSIS — M5416 Radiculopathy, lumbar region: Secondary | ICD-10-CM

## 2023-08-27 DIAGNOSIS — M25551 Pain in right hip: Secondary | ICD-10-CM | POA: Diagnosis not present

## 2023-08-27 MED ORDER — GABAPENTIN 300 MG PO CAPS: 300.0000 mg | ORAL_CAPSULE | Freq: Every day | ORAL | 1 refills | Status: DC

## 2023-08-27 NOTE — Progress Notes (Signed)
Patient ID: Michele Wood, female   DOB: 1961-04-05, 63 y.o.   MRN: [id number]    Michele Wood, is a 62 y.o. female  [id number]:[id number]  [id number]:[id number]  DOB - 10-Mar-1961  Chief Complaint  Patient presents with   Hip Pain   Leg Pain    Right leg        Subjective:   Michele Wood is a 62 y.o. female here today for a follow up visit after being seen in the ED 08/16/2023 with multiple findings.  She was treated for pneumonia.  She has been having increasing back pain for about 1 year.  Over the last few weeks she has started having R leg pain and missing steps at times.  She is using a cane but is unstable with it and needs a walker with large wheels and seat for safety.  She has had previous back surgery.  There was also a CT abd/pelvis that showed inflammation and possible osteo or discitis.  She did not call her neurosurgeon and make an appt yet.  Patient declined MRI at the hospital.  She denies any fever.  Appetite is good.    IMPRESSION: 1. No acute findings or explanation for the patient's symptoms. 2. Colonic diverticulosis without evidence of acute inflammation. 3. Progressive endplate degenerative changes at L5-S1 without surrounding soft tissue inflammatory changes to strongly suggest discitis/osteomyelitis. Correlate clinically. That could be further evaluated with lumbar MRI if clinically warranted. 4.  Aortic Atherosclerosis (ICD10-I70.0). 5. Chest CT findings dictated separately.   After ED visit  Michele Wood is a 62 y.o. female with history obesity, chronic neck pain, high blood pressure, high cholesterol, atrial myxoma, presenting to the ED with complaint of chest pain and abdominal pain.  Patient reports that she had a "viral stomach bug" earlier this week with nausea and diarrhea.  This was diagnosed by her doctor based on her symptoms.  She reports that she woke up earlier this morning around 4 AM with pain in the left lower side of her back,  intermittently feeling this in her shoulder, and now into her left upper abdomen.  She reports it is worse movement and deep inspiration.  She says she has never had pain like this before.  She denies lightheadedness, persistent cough, shortness of breath.  EMS give the patient full dose aspirin and nitroglycerin en route to the hospital with no significant improvement or change of her symptoms.  She reports a continues to have some loose stools this week.   Patient had a coronary CT scan in April 2020 per my review of external records.  She had a coronary calcium score of 26 which is 80th percentile for age and sex matched controlled.  She had minimally nonobstructive CAD.  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  This patient presents to the ED with concern for pleuritic left-sided chest pain. This involves an extensive number of treatment options, and is a complaint that carries with it a high risk of complications and morbidity.  The differential diagnosis includes pneumonia versus thorax versus PE versus referred intra-abdominal pain or diverticulitis versus other   I ordered and personally interpreted labs.  The pertinent results include: No emergent findings   I ordered imaging studies including x-ray of the chest, CT PE study, CT angio of the abdomen I independently visualized and interpreted imaging which showed potential infectious pattern of the lungs, atypical; potential inflammatory findings located around the L5-S1 sacral region  where patient has had prior instrumentation of her lumbar spine I agree with the radiologist interpretation   The patient was maintained on a cardiac monitor.  I personally viewed and interpreted the cardiac monitored which showed an underlying rhythm of: Mild sinus tachycardia and sinus rhythm   Per my interpretation the patient's ECG shows no acute ischemic findings   I ordered medication including IV Rocephin and  doxycycline for community pneumonia   I have reviewed the patients home medicines and have made adjustments as needed   did have a discussion with the patient about the options of performing MRI of the lumbar spine here in the ED to better delineate the lesion that is noted on her CT scan.  The patient reports she has been having lower back pain ongoing for about a year, worse with ambulation.  She denies any numbness or weakness of the legs, but times gets radiculopathy in the legs.  She is not having any active significant lower back pain, erythema, and have a low suspicion that this lesion is a source of her current symptoms.  I doubt this is an active infection, more likely reactive from prior surgery.  The patient preferred to pursue MRI as an outpatient, either through her PCPs office, or through neurosurgery.  She will be contacting [facility] to reestablish care as she was formerly seeing a neurosurgeon who has since retired or left.  If there is a delay in being seen in the office, she will contact her PCPs office to discuss an MRI of the lower back.  This was delineated in her discharge instructions.  Return precautions were also provided.  Both the patient and her husband were present agree of this plan.   After the interventions noted above, I reevaluated the patient and found that they have: stayed the same     Dispostion:   This time the patient's vital signs are stable.  She is not hypoxic.  She does not demonstrate evidence of sepsis.  I do not believe she is requiring hospitalization.  I think this is reasonable to treat as an outpatient for community pneumonia.  The patient is in agreement and wanting to go home.   After consideration of the diagnostic results and the patients response to treatment, I feel that the patent would benefit from close outpatient follow-up.   No problems updated.  ALLERGIES: Allergies  Allergen Reactions   Codeine Sulfate Rash    Milk-Related Compounds Diarrhea and Other (See Comments)    Diarrhea and stomach upset    PAST MEDICAL HISTORY: Past Medical History:  Diagnosis Date   Cervical radiculopathy    Chronic back pain    DDD (degenerative disc disease), cervical    Depression    Diabetes mellitus without complication (HCC)    Hypertension    at age 59   Obesity    Pancreatic cyst 05/31/2019   Incidental - discovered on CT scan   s/p minimally-invasive resection of left atrial myxoma 06/08/2019   Sinusitis    Sleep apnea    at age 63    MEDICATIONS AT HOME: Prior to Admission medications   Medication Sig Start Date End Date Taking? Authorizing Provider  Accu-Chek Softclix Lancets lancets Use as instructed to check blood sugars once a day.  E11.69, E66.01 01/10/23  Yes Marcine Matar, MD  acetaminophen (TYLENOL 8 HOUR) 650 MG CR tablet Take 1 tablet (650 mg total) by mouth every 8 (eight) hours as needed for pain or  fever. 01/30/21  Yes Nanavati, Ankit, MD  amLODipine (NORVASC) 10 MG tablet TAKE 1 TABLET BY MOUTH EVERY DAY 06/09/23  Yes Marcine Matar, MD  aspirin (ASPIRIN LOW DOSE) 81 MG EC tablet Take 1 tablet (81 mg total) by mouth daily. Swallow whole. 12/05/21  Yes Georgian Co M, PA-C  atorvastatin (LIPITOR) 40 MG tablet TAKE 1 TABLET BY MOUTH EVERY DAY 06/11/23  Yes Marcine Matar, MD  Blood Glucose Monitoring Suppl (ACCU-CHEK GUIDE) w/Device KIT Check blood sugars once a day. E11.69, E66.01 01/10/23  Yes Marcine Matar, MD  Cholecalciferol (VITAMIN D3) 2000 UNITS TABS Take 2,000 Units by mouth daily. 09/19/15  Yes Funches, Josalyn, MD  diclofenac Sodium (VOLTAREN) 1 % GEL Apply 2 g topically 4 (four) times daily. 06/18/22  Yes Cecil Cobbs, PA-C  fluticasone (FLONASE) 50 MCG/ACT nasal spray SPRAY 2 SPRAYS INTO EACH NOSTRIL EVERY DAY 08/26/23  Yes Marcine Matar, MD  glucose blood (ACCU-CHEK GUIDE) test strip Use as instructed to check blood sugars once a day.  He 11.69, E66.01  01/10/23  Yes Marcine Matar, MD  hydrochlorothiazide (HYDRODIURIL) 25 MG tablet Take 1 tablet (25 mg total) by mouth daily. 01/10/23  Yes Marcine Matar, MD  hydrOXYzine (VISTARIL) 25 MG capsule TAKE 1 CAPSULE (25 MG TOTAL) BY MOUTH AT BEDTIME AS NEEDED FOR ANXIETY. 12/27/21  Yes Marcine Matar, MD  Lurasidone HCl 60 MG TABS Take 1 tablet (60 mg total) by mouth daily with breakfast. 02/01/21  Yes Arfeen, Phillips Grout, MD  metFORMIN (GLUCOPHAGE) 500 MG tablet 1 tablet p.o. twice a day 01/10/23  Yes Marcine Matar, MD  metoprolol tartrate (LOPRESSOR) 50 MG tablet TAKE 1 TABLET BY MOUTH TWICE A DAY 06/09/23  Yes Marcine Matar, MD  montelukast (SINGULAIR) 10 MG tablet TAKE 1 TABLET BY MOUTH EVERY DAY 06/09/23  Yes Marcine Matar, MD  ondansetron (ZOFRAN) 4 MG tablet Take 1 tablet (4 mg total) by mouth every 8 (eight) hours as needed for nausea or vomiting. 08/13/23  Yes Newlin, Odette Horns, MD  valsartan (DIOVAN) 80 MG tablet TAKE 1 TABLET (80 MG TOTAL) BY MOUTH DAILY. FOR BLOOD PRESSURE 05/27/23  Yes Marcine Matar, MD  gabapentin (NEURONTIN) 300 MG capsule Take 1 capsule (300 mg total) by mouth at bedtime. For nerve pain 08/27/23   Georgian Co M, PA-C    ROS: Neg HEENT Neg resp Neg cardiac Neg GI Neg GU Neg psych Neg neuro  Objective:   Vitals:   08/27/23 0955  BP: 118/84  Pulse: 65  SpO2: 97%  Weight: 255 lb (115.7 kg)   Exam General appearance : Awake, alert, not in any distress. Speech Clear. Not toxic looking HEENT: Atraumatic and Normocephalic Neck: Supple, no JVD. No cervical lymphadenopathy.  Chest: Good air entry bilaterally, CTAB.  No rales/rhonchi/wheezing CVS: S1 S2 regular, no murmurs.  Extremities: B/L Lower Ext shows no edema, both legs are warm to touch.   Neurology: Awake alert, and oriented X 3, CN II-XII intact, Non focal.  Using cane to help with walking.  Gait is guarded.   Skin: No Rash  Data Review Lab Results  Component Value Date    HGBA1C 6.4 05/15/2023   HGBA1C 6.0 09/10/2022   HGBA1C 6.1 05/06/2022    Assessment & Plan   1. Hip pain, bilateral add gabapentin - For home use only DME 4 wheeled rolling walker with seat ([id number]) - Ambulatory referral to Neurosurgery - MR LUMBAR SPINE W WO CONTRAST; Future  2. Abnormal CT of spine - For home use only DME 4 wheeled rolling walker with seat ([id number]) - Ambulatory referral to Neurosurgery(had previous surgery) - MR LUMBAR SPINE W WO CONTRAST; Future  3. Lumbar radiculopathy - gabapentin (NEURONTIN) 300 MG capsule; Take 1 capsule (300 mg total) by mouth at bedtime. For nerve pain  Dispense: 90 capsule; Refill: 1 - For home use only DME 4 wheeled rolling walker with seat ([id number] with seat and large wheels [phone]  - Ambulatory referral to Neurosurgery - MR LUMBAR SPINE W WO CONTRAST; Future  4. Encounter for examination following treatment at hospital    Return in about 3 months (around 11/27/2023) for PCP for chronic conditions-Johnson.  The patient was given clear instructions to go to ER or return to medical center if symptoms don't improve, worsen or new problems develop. The patient verbalized understanding. The patient was told to call to get lab results if they haven't heard anything in the next week.      Georgian Co, PA-C [facility] [place], [place] [phone]   08/27/2023, 11:22 AM

## 2023-08-29 DIAGNOSIS — R937 Abnormal findings on diagnostic imaging of other parts of musculoskeletal system: Secondary | ICD-10-CM | POA: Diagnosis not present

## 2023-08-29 DIAGNOSIS — M5416 Radiculopathy, lumbar region: Secondary | ICD-10-CM | POA: Diagnosis not present

## 2023-08-29 DIAGNOSIS — M25551 Pain in right hip: Secondary | ICD-10-CM | POA: Diagnosis not present

## 2023-09-01 ENCOUNTER — Telehealth: Payer: 59 | Admitting: Internal Medicine

## 2023-09-01 NOTE — Telephone Encounter (Signed)
Spoke with patient Number verified.

## 2023-09-01 NOTE — Telephone Encounter (Addendum)
Spoke with patient. Patient voiced the she received the wrong wheelchair. Call placed to [employer]. Spoke with Sky . Sky asked if the patient could call [employer]. Call to patient. Patient given [employer] #. And advised to ask for [facility] when calling.

## 2023-09-01 NOTE — Telephone Encounter (Signed)
Pt is calling to speak back to Cassandra. Pt states that she is unable to get through to the number that Cassandra gave her to reach [place].  CB- [phone]

## 2023-09-01 NOTE — Telephone Encounter (Signed)
Pt is calling to report that she received the walker. Requesting a walker with Wide wheels and a seat.Pt received the same walker that she already had. Please advise CB- [phone]

## 2023-09-03 ENCOUNTER — Ambulatory Visit: Payer: 59 | Attending: Physician Assistant | Admitting: Physician Assistant

## 2023-09-03 ENCOUNTER — Encounter: Payer: Self-pay | Admitting: Physician Assistant

## 2023-09-03 VITALS — BP 116/80 | HR 75 | Wt 255.0 lb

## 2023-09-03 DIAGNOSIS — M25521 Pain in right elbow: Secondary | ICD-10-CM

## 2023-09-03 DIAGNOSIS — M25522 Pain in left elbow: Secondary | ICD-10-CM

## 2023-09-03 MED ORDER — DICLOFENAC SODIUM 1 % EX GEL: 2.0000 g | Freq: Four times a day (QID) | CUTANEOUS | 0 refills | Status: DC

## 2023-09-03 NOTE — Progress Notes (Signed)
Patient ID: Michele Wood, female   DOB: February 06, 1961, 62 y.o.   MRN: [id number]   Michele Wood, is a 62 y.o. female  WUJ:[id number]  [id number]  DOB - 1960/12/23  Chief Complaint  Patient presents with   Elbow Pain       Subjective:   Michele Wood is a 62 y.o. female here today for B elbow pain.  R>L.  This has been bothering her for years even when she used to work.  Now she notices it most when she cooks a lot or is caring for her 80 pound dog. She feels like it feels swollen B.  No redness.  Normal grip.  No numbness and weakness.     No problems updated.  ALLERGIES: Allergies  Allergen Reactions   Codeine Sulfate Rash   Milk-Related Compounds Diarrhea and Other (See Comments)    Diarrhea and stomach upset    PAST MEDICAL HISTORY: Past Medical History:  Diagnosis Date   Cervical radiculopathy    Chronic back pain    DDD (degenerative disc disease), cervical    Depression    Diabetes mellitus without complication (HCC)    Hypertension    at age 59   Obesity    Pancreatic cyst 05/31/2019   Incidental - discovered on CT scan   s/p minimally-invasive resection of left atrial myxoma 06/08/2019   Sinusitis    Sleep apnea    at age 38    MEDICATIONS AT HOME: Prior to Admission medications   Medication Sig Start Date End Date Taking? Authorizing Provider  Accu-Chek Softclix Lancets lancets Use as instructed to check blood sugars once a day.  E11.69, E66.01 01/10/23  Yes Marcine Matar, MD  acetaminophen (TYLENOL 8 HOUR) 650 MG CR tablet Take 1 tablet (650 mg total) by mouth every 8 (eight) hours as needed for pain or fever. 01/30/21  Yes Nanavati, Ankit, MD  amLODipine (NORVASC) 10 MG tablet TAKE 1 TABLET BY MOUTH EVERY DAY 06/09/23  Yes Marcine Matar, MD  aspirin (ASPIRIN LOW DOSE) 81 MG EC tablet Take 1 tablet (81 mg total) by mouth daily. Swallow whole. 12/05/21  Yes Georgian Co M, PA-C  atorvastatin (LIPITOR) 40 MG tablet TAKE 1 TABLET BY  MOUTH EVERY DAY 06/11/23  Yes Marcine Matar, MD  Blood Glucose Monitoring Suppl (ACCU-CHEK GUIDE) w/Device KIT Check blood sugars once a day. E11.69, E66.01 01/10/23  Yes Marcine Matar, MD  Cholecalciferol (VITAMIN D3) 2000 UNITS TABS Take 2,000 Units by mouth daily. 09/19/15  Yes Funches, Gerilyn Nestle, MD  fluticasone (FLONASE) 50 MCG/ACT nasal spray SPRAY 2 SPRAYS INTO EACH NOSTRIL EVERY DAY 08/26/23  Yes Marcine Matar, MD  gabapentin (NEURONTIN) 300 MG capsule Take 1 capsule (300 mg total) by mouth at bedtime. For nerve pain 08/27/23  Yes Anders Simmonds, PA-C  glucose blood (ACCU-CHEK GUIDE) test strip Use as instructed to check blood sugars once a day.  He 11.69, E66.01 01/10/23  Yes Marcine Matar, MD  hydrochlorothiazide (HYDRODIURIL) 25 MG tablet Take 1 tablet (25 mg total) by mouth daily. 01/10/23  Yes Marcine Matar, MD  hydrOXYzine (VISTARIL) 25 MG capsule TAKE 1 CAPSULE (25 MG TOTAL) BY MOUTH AT BEDTIME AS NEEDED FOR ANXIETY. 12/27/21  Yes Marcine Matar, MD  Lurasidone HCl 60 MG TABS Take 1 tablet (60 mg total) by mouth daily with breakfast. 02/01/21  Yes Arfeen, Phillips Grout, MD  metFORMIN (GLUCOPHAGE) 500 MG tablet 1 tablet p.o. twice a day 01/10/23  Yes  Marcine Matar, MD  metoprolol tartrate (LOPRESSOR) 50 MG tablet TAKE 1 TABLET BY MOUTH TWICE A DAY 06/09/23  Yes Marcine Matar, MD  montelukast (SINGULAIR) 10 MG tablet TAKE 1 TABLET BY MOUTH EVERY DAY 06/09/23  Yes Marcine Matar, MD  ondansetron (ZOFRAN) 4 MG tablet Take 1 tablet (4 mg total) by mouth every 8 (eight) hours as needed for nausea or vomiting. 08/13/23  Yes Newlin, Odette Horns, MD  valsartan (DIOVAN) 80 MG tablet TAKE 1 TABLET (80 MG TOTAL) BY MOUTH DAILY. FOR BLOOD PRESSURE 05/27/23  Yes Marcine Matar, MD  diclofenac Sodium (VOLTAREN) 1 % GEL Apply 2 g topically 4 (four) times daily. 09/03/23   Priti Consoli, Marzella Schlein, PA-C    ROS: Neg HEENT Neg resp Neg cardiac Neg GI Neg GU Neg MS Neg  psych Neg neuro  Objective:   Vitals:   09/03/23 1429  BP: 116/80  Pulse: 75  SpO2: 98%  Weight: 255 lb (115.7 kg)   Exam General appearance : Awake, alert, not in any distress. Speech Clear. Not toxic looking HEENT: Atraumatic and Normocephalic Neck: Supple, no JVD. No cervical lymphadenopathy.  Chest: Good air entry bilaterally, CTAB.  No rales/rhonchi/wheezing CVS: S1 S2 regular, no murmurs.  B arms and elbows examined.  Points to medial elbow B.  There is no swelling or erythema.  No bursa swelling.  Arms with obesity.  No pint tenderness.  Normal supination and pronation and B grip is normal.   Extremities: B/L Lower Ext shows no edema, both legs are warm to touch Neurology: Awake alert, and oriented X 3, CN II-XII intact, Non focal Skin: No Rash  Data Review Lab Results  Component Value Date   HGBA1C 6.4 05/15/2023   HGBA1C 6.0 09/10/2022   HGBA1C 6.1 05/06/2022    Assessment & Plan   1. Bilateral elbow joint pain - DG Elbow 2 Views Left; Future - DG Elbow 2 Views Right; Future - diclofenac Sodium (VOLTAREN) 1 % GEL; Apply 2 g topically 4 (four) times daily.  Dispense: 100 g; Refill: 0    Return in about 3 months (around 12/04/2023) for PCP for chronic conditions-Dr Laural Benes.  The patient was given clear instructions to go to ER or return to medical center if symptoms don't improve, worsen or new problems develop. The patient verbalized understanding. The patient was told to call to get lab results if they haven't heard anything in the next week.      Georgian Co, PA-C [facility] [place], [place] [phone]   09/03/2023, 2:42 PM

## 2023-09-03 NOTE — Patient Instructions (Signed)

## 2023-09-04 ENCOUNTER — Ambulatory Visit
Admission: RE | Admit: 2023-09-04 | Discharge: 2023-09-04 | Disposition: A | Discharge status: Home / Self Care | Payer: 59 | Source: Ambulatory Visit | Attending: Physician Assistant | Admitting: Physician Assistant

## 2023-09-04 DIAGNOSIS — M25522 Pain in left elbow: Secondary | ICD-10-CM | POA: Diagnosis not present

## 2023-09-04 DIAGNOSIS — M25521 Pain in right elbow: Secondary | ICD-10-CM

## 2023-09-05 ENCOUNTER — Other Ambulatory Visit: Payer: Self-pay | Admitting: Internal Medicine

## 2023-09-05 DIAGNOSIS — I152 Hypertension secondary to endocrine disorders: Secondary | ICD-10-CM

## 2023-09-16 NOTE — Telephone Encounter (Signed)
Pt stated she received the wrong wheelchair. She is requesting a walker with wide wheels and seat. Stated they advised her that Dr. Laural Benes ordered a normal one, not one with big wheels. Stated she cannot use the wheelchair because it hurts her. Mentioned it's the same wheelchair that she already had.  Pt is asking that the office contact the company to be able to return this and get the correct wheelchair. Stated has been going back and forth with this for almost a month.   Phone number for the company - [phone]   Please advise.

## 2023-09-16 NOTE — Telephone Encounter (Signed)
spoke with joy at [employer]. 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 I asked if there was something that we could assist with in getting the patient a larger Rolator as she states the one she has now is to small. Joy said that she would have someone to call me back , because she was not sure. Joy given my direct number as a contact.   Call placed to patient and advised patient of the information that I was given from [employer] . Advised patient to call her insurance company and asked if she  could do an appeal so they may reconsider her request for a larger Rolator. Patient voiced that she would call today.

## 2023-09-18 ENCOUNTER — Telehealth: Payer: Self-pay

## 2023-09-18 ENCOUNTER — Ambulatory Visit
Admission: RE | Admit: 2023-09-18 | Discharge: 2023-09-18 | Disposition: A | Discharge status: Home / Self Care | Payer: 59 | Source: Ambulatory Visit | Attending: Physician Assistant | Admitting: Physician Assistant

## 2023-09-18 DIAGNOSIS — M5416 Radiculopathy, lumbar region: Secondary | ICD-10-CM

## 2023-09-18 DIAGNOSIS — R937 Abnormal findings on diagnostic imaging of other parts of musculoskeletal system: Secondary | ICD-10-CM

## 2023-09-18 DIAGNOSIS — M25551 Pain in right hip: Secondary | ICD-10-CM

## 2023-09-18 MED ORDER — GADOPICLENOL 0.5 MMOL/ML IV SOLN: 10.0000 mL | Freq: Once | INTRAVENOUS | Status: DC | PRN

## 2023-09-18 NOTE — Telephone Encounter (Signed)
Let patient know that we received a message from the radiologist that they were unable to do the MRI of the lumbar spine fully due to presence of hardware in her back from previous surgery.  I recommend that she see the neurosurgeon and have them look at the results of the CAT scan that was done a month ago.  Does she have appt with [facility] as yet?  Other option would be to get CT scan of back instead but I think I would leave it up to neurosurgeon.

## 2023-09-18 NOTE — Telephone Encounter (Signed)
Copied from CRM [id number]. Topic: Appointments - Appointment Cancel/Reschedule >> Sep 18, 2023 10:13 AM Shon Hale wrote: Reason for CRM: Pt's MRI appointment had to be cancelled. The radiologist had pt in the scanner; however, they had to abort the scans,. Pt has too much metal, there were too many artifacts from the screws from patient's back surgery. Per Dr. Margo Aye not able to continue and non diagnostic.   angela mcclung, pa

## 2023-09-19 NOTE — Telephone Encounter (Signed)
Called & spoke to the patient. Verified name & DOB. Informed patient know that we received a message from the radiologist that they were unable to do the MRI of the lumbar spine fully due to presence of hardware in her back from previous surgery.  Dr.Johnson recommends that she see the neurosurgeon and have them look at the results of the CAT scan that was done a month ago. Patient stated that she does not have an appointment with [facility]. Informed patient of phone number to call them & make an appointment. Informed patient that the other option would be to get CT scan of back instead but Dr.Johnson thinks it would be best to leave it to the neurosurgeon. Patient expressed verbal understanding of al discussed.

## 2023-09-26 ENCOUNTER — Telehealth: Payer: Self-pay

## 2023-09-26 NOTE — Telephone Encounter (Signed)
-----   Message from Georgian Co sent at 09/23/2023 12:05 PM EST ----- Please call patient. Elbow films are normal. Follow up with PCP or orthopedist if you continue having trouble. Thanks, Georgian Co, PA-C

## 2023-09-26 NOTE — Telephone Encounter (Signed)
Pt was called and no vm was left due to mailbox not being set up.Information was sent to nurse pool.  

## 2023-10-31 LAB — HM DIABETES EYE EXAM

## 2023-11-11 ENCOUNTER — Ambulatory Visit (INDEPENDENT_AMBULATORY_CARE_PROVIDER_SITE_OTHER): Payer: 59 | Admitting: Podiatry

## 2023-11-11 DIAGNOSIS — Z91198 Patient's noncompliance with other medical treatment and regimen for other reason: Secondary | ICD-10-CM

## 2023-11-11 NOTE — Progress Notes (Addendum)
1. Failure to attend appointment with reason given    Rescheduled.

## 2023-11-20 ENCOUNTER — Other Ambulatory Visit: Payer: Self-pay | Admitting: Internal Medicine

## 2023-11-20 DIAGNOSIS — I10 Essential (primary) hypertension: Secondary | ICD-10-CM

## 2023-11-20 NOTE — Telephone Encounter (Signed)
 Requested medication (s) are due for refill today: yes  Requested medication (s) are on the active medication list: yes  Last refill:  06/09/23 #90 1 refills  Future visit scheduled: no   Notes to clinic:  last MWV 08/01/23. Do you want to refill Rx ? Does patient need appt ?     Requested Prescriptions  Pending Prescriptions Disp Refills   amLODipine  (NORVASC ) 10 MG tablet [Pharmacy Med Name: AMLODIPINE  BESYLATE 10 MG TAB] 90 tablet 1    Sig: TAKE 1 TABLET BY MOUTH EVERY DAY     Cardiovascular: Calcium  Channel Blockers 2 Passed - 11/20/2023 12:15 PM      Passed - Last BP in normal range    BP Readings from Last 1 Encounters:  09/03/23 116/80         Passed - Last Heart Rate in normal range    Pulse Readings from Last 1 Encounters:  09/03/23 75         Passed - Valid encounter within last 6 months    Recent Outpatient Visits           2 months ago Bilateral elbow joint pain   [facility] [place]. [facility], [place], [place]   2 months ago Hip pain, bilateral   [facility] [place]. [facility], [place], [place]   3 months ago Gastroenteritis   [facility] [place]. [facility] Delbert Clam, MD   6 months ago Type 2 diabetes mellitus with morbid obesity [facility])   [facility] [place]. [facility] Vicci Sober B, MD   10 months ago Type 2 diabetes mellitus with morbid obesity [facility])   [facility] . [facility] Vicci Sober NOVAK, MD

## 2023-12-07 ENCOUNTER — Other Ambulatory Visit: Payer: Self-pay | Admitting: Internal Medicine

## 2023-12-07 DIAGNOSIS — I10 Essential (primary) hypertension: Secondary | ICD-10-CM

## 2023-12-07 DIAGNOSIS — J324 Chronic pansinusitis: Secondary | ICD-10-CM

## 2023-12-11 ENCOUNTER — Other Ambulatory Visit: Payer: Self-pay | Admitting: Family Medicine

## 2024-01-28 ENCOUNTER — Other Ambulatory Visit: Payer: Self-pay | Admitting: Internal Medicine

## 2024-01-28 DIAGNOSIS — B9689 Other specified bacterial agents as the cause of diseases classified elsewhere: Secondary | ICD-10-CM

## 2024-02-18 ENCOUNTER — Other Ambulatory Visit: Payer: Self-pay | Admitting: Internal Medicine

## 2024-02-18 DIAGNOSIS — I10 Essential (primary) hypertension: Secondary | ICD-10-CM

## 2024-02-18 MED ORDER — AMLODIPINE BESYLATE 10 MG PO TABS: 10.0000 mg | ORAL_TABLET | Freq: Every day | ORAL | 0 refills | Status: DC

## 2024-02-25 ENCOUNTER — Other Ambulatory Visit: Payer: Self-pay | Admitting: Internal Medicine

## 2024-02-25 DIAGNOSIS — B9689 Other specified bacterial agents as the cause of diseases classified elsewhere: Secondary | ICD-10-CM

## 2024-03-03 ENCOUNTER — Ambulatory Visit (INDEPENDENT_AMBULATORY_CARE_PROVIDER_SITE_OTHER): Payer: 59 | Admitting: Podiatry

## 2024-03-03 ENCOUNTER — Ambulatory Visit (INDEPENDENT_AMBULATORY_CARE_PROVIDER_SITE_OTHER)

## 2024-03-03 ENCOUNTER — Encounter: Payer: Self-pay | Admitting: Podiatry

## 2024-03-03 DIAGNOSIS — M778 Other enthesopathies, not elsewhere classified: Secondary | ICD-10-CM

## 2024-03-03 DIAGNOSIS — M7672 Peroneal tendinitis, left leg: Secondary | ICD-10-CM

## 2024-03-03 DIAGNOSIS — M7752 Other enthesopathy of left foot: Secondary | ICD-10-CM | POA: Diagnosis not present

## 2024-03-03 NOTE — Progress Notes (Signed)
 Subjective:  Patient ID: Michele Wood, female    DOB: Feb 01, 1961,  MRN: [id number]  No chief complaint on file.   63 y.o. female presents with the above complaint.  Patient presents with left lateral foot pain that came out of nowhere hurts with ambulation hurts with pressure.  Patient would like to discuss treatment options for this.  She denies seeing anyone else prior to seeing me denies any other acute complaints.   Review of Systems: Negative except as noted in the HPI. Denies N/V/F/Ch.  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  E11.69, E66.01, Disp: 100 each, Rfl: 12   acetaminophen  (TYLENOL  8 HOUR) 650 MG CR tablet, Take 1 tablet (650 mg total) by mouth every 8 (eight) hours as needed for pain or fever., Disp: 30 tablet, Rfl: 0   amLODipine  (NORVASC ) 10 MG tablet, Take 1 tablet (10 mg total) by mouth daily., Disp: 30 tablet, Rfl: 0   aspirin  (ASPIRIN  LOW DOSE) 81 MG EC tablet, Take 1 tablet (81 mg total) by mouth daily. Swallow whole., Disp: 90 tablet, Rfl: 1   atorvastatin  (LIPITOR) 40 MG tablet, TAKE 1 TABLET BY MOUTH EVERY DAY, Disp: 90 tablet, Rfl: 1   Blood Glucose Monitoring Suppl (ACCU-CHEK GUIDE) w/Device KIT, Check blood sugars once a day. E11.69, E66.01, Disp: 1 kit, Rfl: 0   Cholecalciferol (VITAMIN D3) 2000 UNITS TABS, Take 2,000 Units by mouth daily., Disp: 30 tablet, Rfl: 11   diclofenac  Sodium (VOLTAREN ) 1 % GEL, Apply 2 g topically 4 (four) times daily., Disp: 100 g,  Rfl: 0   fluticasone  (FLONASE ) 50 MCG/ACT nasal spray, SPRAY 2 SPRAYS INTO EACH NOSTRIL EVERY DAY, Disp: 48 mL, Rfl: 1   gabapentin  (NEURONTIN ) 300 MG capsule, Take 1 capsule (300 mg total) by mouth at bedtime. For nerve pain, Disp: 90 capsule, Rfl: 1   glucose blood (ACCU-CHEK GUIDE) test strip, Use as instructed to check blood sugars once a day.  He 11.69, E66.01, Disp: 100 each, Rfl: 12   hydrochlorothiazide  (HYDRODIURIL ) 25 MG tablet, Take 1 tablet (25 mg total) by mouth daily., Disp: 90 tablet, Rfl: 3   hydrOXYzine  (VISTARIL ) 25 MG capsule, TAKE 1 CAPSULE (25 MG TOTAL) BY MOUTH AT BEDTIME AS NEEDED FOR ANXIETY., Disp: 90 capsule, Rfl: 1   Lurasidone  HCl 60 MG TABS, Take 1 tablet (60 mg total) by mouth daily with breakfast., Disp: 30 tablet, Rfl: 2   metFORMIN  (GLUCOPHAGE ) 500 MG tablet, 1 tablet p.o. twice a day, Disp: 180 tablet, Rfl: 1   metoprolol  tartrate (LOPRESSOR ) 50 MG tablet, TAKE 1 TABLET BY MOUTH TWICE A DAY, Disp: 180 tablet, Rfl: 1   montelukast  (SINGULAIR ) 10 MG tablet, TAKE 1 TABLET BY MOUTH EVERY DAY, Disp: 90 tablet, Rfl: 1   ondansetron  (ZOFRAN ) 4 MG tablet, TAKE 1 TABLET BY MOUTH EVERY 8 HOURS AS NEEDED FOR NAUSEA AND VOMITING, Disp: 20 tablet, Rfl: 0  valsartan  (DIOVAN ) 80 MG tablet, TAKE 1 TABLET (80 MG TOTAL) BY MOUTH DAILY. FOR BLOOD PRESSURE, Disp: 90 tablet, Rfl: 1  Social History   Tobacco Use  Smoking Status Never  Smokeless Tobacco Never    Allergies  Allergen Reactions   Codeine Sulfate Rash   Milk-Related Compounds Diarrhea and Other (See Comments)    Diarrhea and stomach upset   Objective:  There were no vitals filed for this visit. There is no height or weight on file to calculate BMI. Constitutional Well developed. Well nourished.  Vascular Dorsalis pedis pulses palpable bilaterally. Posterior tibial pulses palpable bilaterally. Capillary refill normal to all digits.  No cyanosis or clubbing noted. Pedal hair growth normal.  Neurologic Normal  speech. Oriented to person, place, and time. Epicritic sensation to light touch grossly present bilaterally.  Dermatologic Nails well groomed and normal in appearance. No open wounds. No skin lesions.  Orthopedic: Pain on palpation along course of the peroneal tendon pain with lateral pain along the course of the tendon pain with resisted dorsiflexion eversion of the foot no pain with plantarflexion inversion of the foot no pain at the Achilles tendon posterior tibial tendon ATFL ligament.  Pain along the course of the peroneal tendon.   Radiographs: None Assessment:   1. Capsulitis of foot, left   2. Peroneal tendinitis of left lower extremity    Plan:  Patient was evaluated and treated and all questions answered.  Left peroneal tendonitis  - All questions and concerns were discussed with the patient in extensive detail - Given the amount of pain that she is having should benefit from cam boot immobilization - Cam boot was dispensed.  If there is no improvement we will discuss steroid injection.  No follow-ups on file.

## 2024-03-08 ENCOUNTER — Other Ambulatory Visit: Payer: Self-pay | Admitting: Internal Medicine

## 2024-03-08 DIAGNOSIS — E1159 Type 2 diabetes mellitus with other circulatory complications: Secondary | ICD-10-CM

## 2024-03-31 ENCOUNTER — Ambulatory Visit (INDEPENDENT_AMBULATORY_CARE_PROVIDER_SITE_OTHER): Admitting: Podiatry

## 2024-03-31 DIAGNOSIS — M7672 Peroneal tendinitis, left leg: Secondary | ICD-10-CM

## 2024-03-31 NOTE — Progress Notes (Signed)
 Subjective:  Patient ID: Michele Wood, female    DOB: 1961/08/13,  MRN: [id number]  Chief Complaint  Patient presents with   Foot Pain    63 y.o. female presents with the above complaint.  P patient presents with follow-up of left peroneal tendinitis she says she is doing a lot better.  She would like to discuss next treatment plan.  Denies any other acute complaints.   Review of Systems: Negative except as noted in the HPI. Denies N/V/F/Ch.  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  E11.69, E66.01, Disp: 100 each, Rfl: 12   acetaminophen  (TYLENOL  8 HOUR) 650 MG CR tablet, Take 1 tablet (650 mg total) by mouth every 8 (eight) hours as needed for pain or fever., Disp: 30 tablet, Rfl: 0   amLODipine  (NORVASC ) 10 MG tablet, Take 1 tablet (10 mg total) by mouth daily., Disp: 30 tablet, Rfl: 0   aspirin  (ASPIRIN  LOW DOSE) 81 MG EC tablet, Take 1 tablet (81 mg total) by mouth daily. Swallow whole., Disp: 90 tablet, Rfl: 1   atorvastatin  (LIPITOR) 40 MG tablet, TAKE 1 TABLET BY MOUTH EVERY DAY, Disp: 90 tablet, Rfl: 1   Blood Glucose Monitoring Suppl (ACCU-CHEK GUIDE) w/Device KIT, Check blood sugars once a day. E11.69, E66.01, Disp: 1 kit, Rfl: 0   Cholecalciferol (VITAMIN D3) 2000 UNITS TABS, Take 2,000 Units by mouth daily., Disp: 30 tablet, Rfl: 11   diclofenac  Sodium (VOLTAREN ) 1 % GEL, Apply 2 g topically 4 (four) times daily., Disp: 100 g, Rfl: 0   fluticasone  (FLONASE ) 50 MCG/ACT nasal  spray, SPRAY 2 SPRAYS INTO EACH NOSTRIL EVERY DAY, Disp: 48 mL, Rfl: 1   gabapentin  (NEURONTIN ) 300 MG capsule, Take 1 capsule (300 mg total) by mouth at bedtime. For nerve pain, Disp: 90 capsule, Rfl: 1   glucose blood (ACCU-CHEK GUIDE) test strip, Use as instructed to check blood sugars once a day.  He 11.69, E66.01, Disp: 100 each, Rfl: 12   hydrochlorothiazide  (HYDRODIURIL ) 25 MG tablet, Take 1 tablet (25 mg total) by mouth daily., Disp: 90 tablet, Rfl: 3   hydrOXYzine  (VISTARIL ) 25 MG capsule, TAKE 1 CAPSULE (25 MG TOTAL) BY MOUTH AT BEDTIME AS NEEDED FOR ANXIETY., Disp: 90 capsule, Rfl: 1   Lurasidone  HCl 60 MG TABS, Take 1 tablet (60 mg total) by mouth daily with breakfast., Disp: 30 tablet, Rfl: 2   metFORMIN  (GLUCOPHAGE ) 500 MG tablet, 1 tablet p.o. twice a day, Disp: 180 tablet, Rfl: 1   metoprolol  tartrate (LOPRESSOR ) 50 MG tablet, TAKE 1 TABLET BY MOUTH TWICE A DAY, Disp: 180 tablet, Rfl: 1   montelukast  (SINGULAIR ) 10 MG tablet, TAKE 1 TABLET BY MOUTH EVERY DAY, Disp: 90 tablet, Rfl: 1   ondansetron  (ZOFRAN ) 4 MG tablet, TAKE 1 TABLET BY MOUTH EVERY 8 HOURS AS NEEDED FOR NAUSEA AND VOMITING, Disp: 20 tablet, Rfl: 0   valsartan  (DIOVAN ) 80 MG  tablet, TAKE 1 TABLET (80 MG TOTAL) BY MOUTH DAILY. FOR BLOOD PRESSURE, Disp: 90 tablet, Rfl: 1  Social History   Tobacco Use  Smoking Status Never  Smokeless Tobacco Never    Allergies  Allergen Reactions   Codeine Sulfate Rash   Milk-Related Compounds Diarrhea and Other (See Comments)    Diarrhea and stomach upset   Objective:  There were no vitals filed for this visit. There is no height or weight on file to calculate BMI. Constitutional Well developed. Well nourished.  Vascular Dorsalis pedis pulses palpable bilaterally. Posterior tibial pulses palpable bilaterally. Capillary refill normal to all digits.  No cyanosis or clubbing noted. Pedal hair growth normal.  Neurologic Normal speech. Oriented to person, place, and  time. Epicritic sensation to light touch grossly present bilaterally.  Dermatologic Nails well groomed and normal in appearance. No open wounds. No skin lesions.  Orthopedic: No further pain on palpation along course of the peroneal tendon no further pain with lateral pain along the course of the tendon no further pain with resisted dorsiflexion eversion of the foot no pain with plantarflexion inversion of the foot no pain at the Achilles tendon posterior tibial tendon ATFL ligament.  Pain along the course of the peroneal tendon.   Radiographs: None Assessment:   No diagnosis found.  Plan:  Patient was evaluated and treated and all questions answered.  Left peroneal tendonitis  - All questions and concerns were discussed with the patient in extensive detail - Clinically her pain resolved considerably.  At this time patient would benefit from transition out of cam boot into a Tri-Lock ankle brace Tri-Lock ankle brace was dispensed.  At this time no further injections indicated as patient's pain is mostly resolved

## 2024-05-16 ENCOUNTER — Other Ambulatory Visit: Payer: Self-pay | Admitting: Internal Medicine

## 2024-05-16 DIAGNOSIS — I10 Essential (primary) hypertension: Secondary | ICD-10-CM

## 2024-05-19 ENCOUNTER — Encounter: Payer: Self-pay | Admitting: Podiatry

## 2024-05-19 ENCOUNTER — Ambulatory Visit (INDEPENDENT_AMBULATORY_CARE_PROVIDER_SITE_OTHER): Admitting: Podiatry

## 2024-05-19 DIAGNOSIS — M7672 Peroneal tendinitis, left leg: Secondary | ICD-10-CM | POA: Diagnosis not present

## 2024-05-19 MED ORDER — DEXAMETHASONE SODIUM PHOSPHATE 120 MG/30ML IJ SOLN
4.0000 mg | Freq: Once | INTRAMUSCULAR | Status: AC
  Administered 2024-05-19: 4 mg via INTRA_ARTICULAR

## 2024-05-19 MED ORDER — TRIAMCINOLONE ACETONIDE 10 MG/ML IJ SUSP
2.5000 mg | Freq: Once | INTRAMUSCULAR | Status: AC
  Administered 2024-05-19: 2.5 mg via INTRA_ARTICULAR

## 2024-05-19 NOTE — Patient Instructions (Signed)
 Peroneal Tendinopathy Rehab Ask your health care provider which exercises are safe for you. Do exercises exactly as told by your health care provider and adjust them as directed. It is normal to feel mild stretching, pulling, tightness, or discomfort as you do these exercises. Stop right away if you feel sudden pain or your pain gets worse. Do not begin these exercises until told by your health care provider. Stretching and range-of-motion exercises These exercises warm up your muscles and joints. They can help improve the movement and flexibility of your ankle. They may also help to relieve pain and stiffness. Gastrocnemius and soleus stretch, standing This is an exercise in which you stand on a step and use your body weight to stretch your calf muscles. To do this exercise: Stand on the edge of a step on the ball of your left / right foot. The ball of your foot is on the walking surface, right under your toes. Keep your other foot firmly on the same step. Hold on to the wall, a railing, or a chair for balance. Slowly lift your other foot, allowing your body weight to press your left / right heel down over the edge of the step. You should feel a stretch in your left / right calf (gastrocnemius and soleus). Hold this position for __________ seconds. Return both feet to the step. Repeat this exercise with a slight bend in your left / right knee. Repeat __________ times with your left / right knee straight and __________ times with your left / right knee bent. Complete this exercise __________ times a day. Strengthening exercises These exercises build strength and endurance in your foot and ankle. Endurance is the ability to use your muscles for a long time, even after they get tired. Ankle dorsiflexion with band  Secure a rubber exercise band or tube to an object, such as a table leg, that will not move when the band is pulled. Secure the other end of the band around your left / right foot. Sit on  the floor. Face the object with your left / right leg extended. The band or tube should be slightly tense when your foot is relaxed. Slowly flex your left / right ankle and toes to bring your foot toward you (dorsiflexion). Hold this position for __________ seconds. Let the band or tube slowly pull your foot back to the starting position. Repeat __________ times. Complete this exercise __________ times a day. Ankle eversion  Sit on the floor with your legs straight out in front of you. Loop a rubber exercise band or tube around the ball of your left / right foot. The ball of your foot is on the walking surface, right under your toes. Hold the ends of the band in your hands. You can also secure the band to a stable object. The band or tube should be slightly tense when your foot is relaxed. Slowly push your foot outward, away from your other leg (eversion). Hold this position for __________ seconds. Slowly return your foot to the starting position. Repeat __________ times. Complete this exercise __________ times a day. Plantar flexion, standing This exercise is sometimes called a standing heel raise. Stand with your feet shoulder-width apart. Place your hands on a wall or table to steady yourself as needed. Try not to use it for support. Keep your weight spread evenly over the width of your feet while you slowly rise up on your toes (plantar flexion). If told by your health care provider: Shift your weight  toward your left / right leg until you feel challenged. Stand on your left / right leg only. Hold this position for __________ seconds. Repeat __________ times. Complete this exercise __________ times a day. Single leg stand  Without shoes, stand near a railing or in a doorway. You may hold on to the railing or doorframe as needed. Stand on your left / right foot. Keep your big toe down on the floor and try to keep your arch lifted. Do not roll to the outside of your foot. If this  exercise is too easy, you can try it with your eyes closed or while standing on a pillow. Hold this position for __________ seconds. Repeat __________ times. Complete this exercise __________ times a day. This information is not intended to replace advice given to you by your health care provider. Make sure you discuss any questions you have with your health care provider. Document Revised: 01/24/2022 Document Reviewed: 01/24/2022 Elsevier Patient Education  2024 [employer].

## 2024-05-19 NOTE — Progress Notes (Signed)
  Subjective:  Patient ID: Michele Wood, female    DOB: 06/25/1961,   MRN: [id number]  Chief Complaint  Patient presents with   Tendonitis    It's a little sore but not like it used to be.  It's still swelling.    63 y.o. female presents for concern of left foot pain peroneal tendonitis that has been followed by Dr. Tobie. She has bee in a boot and transitions to an ankle brace. Relates some soreness and swelling but improved.  . Denies any other pedal complaints. Denies n/v/f/c.   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 CFT <3 seconds. Normal hair growth on digits. Edema to left foot.  Skin. No lacerations or abrasions bilateral feet.  Musculoskeletal: MMT 5/5 bilateral lower extremities in DF, PF, Inversion and Eversion. Deceased ROM in DF of ankle joint. Tender to insertion of peroneal tendon at baser of fifth metatarsal and pain along the tendon to lateral malleolus. Edema noted in area. Pain with eversion and PF.  Neurological: Sensation intact to light touch.   Assessment:   1. Peroneal tendinitis of left lower extremity      Plan:  Patient was evaluated and treated and all questions answered. X-rays reviewed and discussed with patient. Discussed peroneal tendinitis and treatment options at length with patient Discussed stretching exercises and provided handout. Continue brace.  Injection offered today. Procedure below.  Discussed that if the symptoms do not improve can consider PT/MRI. Patient to return in 6 to 8 weeks or sooner  if symptoms fail to improve or worsen.   Procedure: Injection Tendon/Ligament Discussed alternatives, risks, complications and verbal consent was obtained.  Location: Left peroneal tendon . Skin Prep: Alcohol. Injectate: 1cc 0.5% marcaine  plain, 1 cc dexamethasone  0.5 cc kenalog   Disposition: Patient tolerated procedure well. Injection site dressed with a band-aid.  Post-injection care was discussed and return precautions discussed.    Asberry Failing, DPM

## 2024-06-04 ENCOUNTER — Other Ambulatory Visit: Payer: Self-pay | Admitting: Internal Medicine

## 2024-06-04 DIAGNOSIS — E1169 Type 2 diabetes mellitus with other specified complication: Secondary | ICD-10-CM

## 2024-06-04 DIAGNOSIS — J324 Chronic pansinusitis: Secondary | ICD-10-CM

## 2024-06-04 DIAGNOSIS — I10 Essential (primary) hypertension: Secondary | ICD-10-CM

## 2024-06-13 ENCOUNTER — Other Ambulatory Visit: Payer: Self-pay | Admitting: Internal Medicine

## 2024-06-13 DIAGNOSIS — I10 Essential (primary) hypertension: Secondary | ICD-10-CM

## 2024-06-30 ENCOUNTER — Ambulatory Visit (INDEPENDENT_AMBULATORY_CARE_PROVIDER_SITE_OTHER): Admitting: Podiatry

## 2024-06-30 ENCOUNTER — Encounter: Payer: Self-pay | Admitting: Podiatry

## 2024-06-30 DIAGNOSIS — M7672 Peroneal tendinitis, left leg: Secondary | ICD-10-CM

## 2024-06-30 MED ORDER — DEXAMETHASONE SODIUM PHOSPHATE 120 MG/30ML IJ SOLN
4.0000 mg | Freq: Once | INTRAMUSCULAR | Status: AC
  Administered 2024-06-30: 4 mg via INTRA_ARTICULAR

## 2024-06-30 MED ORDER — TRIAMCINOLONE ACETONIDE 10 MG/ML IJ SUSP
2.5000 mg | Freq: Once | INTRAMUSCULAR | Status: AC
  Administered 2024-06-30: 2.5 mg via INTRA_ARTICULAR

## 2024-06-30 NOTE — Progress Notes (Signed)
  Subjective:  Patient ID: Michele Wood, female    DOB: [birth year],   MRN: [id number]  Chief Complaint  Patient presents with   Tendonitis    It's been swelling.   Callouses    Can I get this shaved down on my right big toe.    63 y.o. female presents for concern of left foot pain peroneal tendonitis . Relates doing some better but still getting pain. She relates the injection helped up until last week. She stopped the brace as it was hurting and has not been stretching . Denies any other pedal complaints. Denies n/v/f/c.   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 CFT <3 seconds. Normal hair growth on digits. Edema to left foot.  Skin. No lacerations or abrasions bilateral feet.  Musculoskeletal: MMT 5/5 bilateral lower extremities in DF, PF, Inversion and Eversion. Deceased ROM in DF of ankle joint. Tender to insertion of peroneal tendon at baser of fifth metatarsal and pain along the tendon to lateral malleolus. Edema noted in area. Pain with eversion and PF.  Neurological: Sensation intact to light touch.   Assessment:   1. Peroneal tendinitis of left lower extremity       Plan:  Patient was evaluated and treated and all questions answered. X-rays reviewed and discussed with patient. Discussed peroneal tendinitis and treatment options at length with patient Discussed stretching exercises and provided handout. Continue brace.  Injection offered today. Procedure below.  Discussed that if the symptoms do not improve can consider  PT/MRI. Patient to return in 6 to 8 weeks or sooner if symptoms fail to improve or worsen.   Procedure: Injection Tendon/Ligament Discussed alternatives, risks, complications and verbal consent was obtained.  Location: Left peroneal tendon . Skin Prep: Alcohol. Injectate: 1cc 0.5% marcaine  plain, 1 cc dexamethasone  0.5 cc kenalog   Disposition: Patient tolerated procedure well. Injection site dressed with a band-aid.  Post-injection care was discussed and return precautions discussed.    Asberry Failing, DPM

## 2024-06-30 NOTE — Patient Instructions (Signed)
 Peroneal Tendinopathy Rehab Ask your health care provider which exercises are safe for you. Do exercises exactly as told by your health care provider and adjust them as directed. It is normal to feel mild stretching, pulling, tightness, or discomfort as you do these exercises. Stop right away if you feel sudden pain or your pain gets worse. Do not begin these exercises until told by your health care provider. Stretching and range-of-motion exercises These exercises warm up your muscles and joints. They can help improve the movement and flexibility of your ankle. They may also help to relieve pain and stiffness. Gastrocnemius and soleus stretch, standing This is an exercise in which you stand on a step and use your body weight to stretch your calf muscles. To do this exercise: Stand on the edge of a step on the ball of your left / right foot. The ball of your foot is on the walking surface, right under your toes. Keep your other foot firmly on the same step. Hold on to the wall, a railing, or a chair for balance. Slowly lift your other foot, allowing your body weight to press your left / right heel down over the edge of the step. You should feel a stretch in your left / right calf (gastrocnemius and soleus). Hold this position for __________ seconds. Return both feet to the step. Repeat this exercise with a slight bend in your left / right knee. Repeat __________ times with your left / right knee straight and __________ times with your left / right knee bent. Complete this exercise __________ times a day. Strengthening exercises These exercises build strength and endurance in your foot and ankle. Endurance is the ability to use your muscles for a long time, even after they get tired. Ankle dorsiflexion with band  Secure a rubber exercise band or tube to an object, such as a table leg, that will not move when the band is pulled. Secure the other end of the band around your left / right foot. Sit on  the floor. Face the object with your left / right leg extended. The band or tube should be slightly tense when your foot is relaxed. Slowly flex your left / right ankle and toes to bring your foot toward you (dorsiflexion). Hold this position for __________ seconds. Let the band or tube slowly pull your foot back to the starting position. Repeat __________ times. Complete this exercise __________ times a day. Ankle eversion  Sit on the floor with your legs straight out in front of you. Loop a rubber exercise band or tube around the ball of your left / right foot. The ball of your foot is on the walking surface, right under your toes. Hold the ends of the band in your hands. You can also secure the band to a stable object. The band or tube should be slightly tense when your foot is relaxed. Slowly push your foot outward, away from your other leg (eversion). Hold this position for __________ seconds. Slowly return your foot to the starting position. Repeat __________ times. Complete this exercise __________ times a day. Plantar flexion, standing This exercise is sometimes called a standing heel raise. Stand with your feet shoulder-width apart. Place your hands on a wall or table to steady yourself as needed. Try not to use it for support. Keep your weight spread evenly over the width of your feet while you slowly rise up on your toes (plantar flexion). If told by your health care provider: Shift your weight  toward your left / right leg until you feel challenged. Stand on your left / right leg only. Hold this position for __________ seconds. Repeat __________ times. Complete this exercise __________ times a day. Single leg stand  Without shoes, stand near a railing or in a doorway. You may hold on to the railing or doorframe as needed. Stand on your left / right foot. Keep your big toe down on the floor and try to keep your arch lifted. Do not roll to the outside of your foot. If this  exercise is too easy, you can try it with your eyes closed or while standing on a pillow. Hold this position for __________ seconds. Repeat __________ times. Complete this exercise __________ times a day. This information is not intended to replace advice given to you by your health care provider. Make sure you discuss any questions you have with your health care provider. Document Revised: 01/24/2022 Document Reviewed: 01/24/2022 Elsevier Patient Education  2024 [employer].

## 2024-07-22 ENCOUNTER — Ambulatory Visit: Attending: Family Medicine | Admitting: Internal Medicine

## 2024-07-22 DIAGNOSIS — Z23 Encounter for immunization: Secondary | ICD-10-CM

## 2024-07-22 DIAGNOSIS — E119 Type 2 diabetes mellitus without complications: Secondary | ICD-10-CM

## 2024-07-22 DIAGNOSIS — I152 Hypertension secondary to endocrine disorders: Secondary | ICD-10-CM

## 2024-07-22 DIAGNOSIS — E1159 Type 2 diabetes mellitus with other circulatory complications: Secondary | ICD-10-CM

## 2024-07-22 DIAGNOSIS — E1169 Type 2 diabetes mellitus with other specified complication: Secondary | ICD-10-CM | POA: Diagnosis not present

## 2024-07-22 DIAGNOSIS — Z7984 Long term (current) use of oral hypoglycemic drugs: Secondary | ICD-10-CM

## 2024-07-22 DIAGNOSIS — M79671 Pain in right foot: Secondary | ICD-10-CM | POA: Diagnosis not present

## 2024-07-22 DIAGNOSIS — Z1211 Encounter for screening for malignant neoplasm of colon: Secondary | ICD-10-CM

## 2024-07-22 DIAGNOSIS — Z6841 Body Mass Index (BMI) 40.0 and over, adult: Secondary | ICD-10-CM

## 2024-07-22 DIAGNOSIS — Z1231 Encounter for screening mammogram for malignant neoplasm of breast: Secondary | ICD-10-CM

## 2024-07-22 DIAGNOSIS — E785 Hyperlipidemia, unspecified: Secondary | ICD-10-CM

## 2024-07-22 LAB — GLUCOSE, POCT (MANUAL RESULT ENTRY): POC Glucose: 131 mg/dL — AB (ref 70–99)

## 2024-07-22 LAB — POCT GLYCOSYLATED HEMOGLOBIN (HGB A1C): HbA1c, POC (controlled diabetic range): 6.6 % (ref 0.0–7.0)

## 2024-07-22 MED ORDER — MONTELUKAST SODIUM 10 MG PO TABS: 10.0000 mg | ORAL_TABLET | Freq: Every day | ORAL | 0 refills | Status: DC

## 2024-07-22 MED ORDER — METFORMIN HCL 500 MG PO TABS: ORAL_TABLET | ORAL | 1 refills | Status: AC

## 2024-07-22 MED ORDER — ATORVASTATIN CALCIUM 40 MG PO TABS: 40.0000 mg | ORAL_TABLET | Freq: Every day | ORAL | 1 refills | Status: AC

## 2024-07-22 MED ORDER — AMLODIPINE BESYLATE 10 MG PO TABS: 10.0000 mg | ORAL_TABLET | Freq: Every day | ORAL | 1 refills | Status: AC

## 2024-07-22 MED ORDER — RYBELSUS 3 MG PO TABS: 3.0000 mg | ORAL_TABLET | Freq: Every day | ORAL | 2 refills | Status: DC

## 2024-07-22 NOTE — Progress Notes (Signed)
 Patient ID: Michele Wood, female    DOB: 07/06/1961  MRN: [id number]  CC: Annual Exam (Physical. Med refills./Reports stopping lidocaine  - no longer alleviating or helping sleep/Swelling of bilateral feet/Yes to flu vax administered on 07/22/24 - C.A.)   Subjective: Michele Wood is a 63 y.o. female who presents for chronic ds management. She was scheduled as physical but since I have not seen her in over 1 yr to address chronic ds, she is agreeable to do this today then schedule for Medicare Wellness Her concerns today include:  Hx of HTN, LT atrial myxoma (s/p minimally invasive surgery 05/2019), minimal CAD on cardiac CT, chronic sinusitis, degen disc of c-spine, chronic LBP, vit D def, obesity, anx/dep,  DM (with ? neuropathy in feet from DM vs lower back), dep with psychosis.   Discussed the use of AI scribe software for clinical note transcription with the patient, who gave verbal consent to proceed.  History of Present Illness Michele Wood is a 63 year old female with diabetes, hypertension, and hyperlipidemia who presents for follow-up of her chronic conditions.   DM: Lab Results  Component Value Date   HGBA1C 6.6 07/22/2024  Her diabetes is managed with metformin , and her recent HbA1c is 6.6%, which is below her target of 7%. Her blood sugar was 131, and she checks her blood sugars occasionally. She describes her eating habits as 'pretty good,' noting that she and her husband eat a lot of salads. She has gained 4 pounds since her last visit in August of the previous year, now weighing 258 pounds. She is actively trying to manage her weight through dietary changes, including eating more salads and reducing meal sizes.  HTN: Her blood pressure today was 121/82. She is currently taking amlodipine  and metoprolol  but is unsure about her use of hydrochlorothiazide  and valsartan  which are also on her list. She usually picks up medications from the pharmacy when notified but  did not bring her medications to this visit for reconciliation.  She is experiencing pain and swelling in her right foot, which started two days ago. Previously, she had similar issues with her left foot, for which she received two injections from a foot doctor, resulting in improvement. She was dx with peroneal tendinitis at that time. The pain in the right foot is located around the ankle and worsens with pressure or walking. Swelling typically occurs at night, and she denies any recent injury. She has a family history of gout and has cut out sodas and certain foods to manage potential gout symptoms. Had uric acid level of 7.3 a few yrs ago.  She mentions having had open heart surgery for a growth in her heart and occasionally experiences pain in the scar area and wants to know if this is normal.    Patient Active Problem List   Diagnosis Date Noted   Hyperlipidemia associated with type 2 diabetes mellitus (HCC) 02/28/2020   Hypotension 07/19/2019   CAD (coronary artery disease) 06/23/2019   S/P minimally-invasive resection of left atrial myxoma 06/08/2019   Pancreatic cyst 05/31/2019   Spondylolisthesis of lumbar region 03/31/2019   Atrial myxoma 03/19/2019   Dyslipidemia, goal LDL below 70 12/16/2018   Family history of heart disease 12/16/2018   Left bundle branch block 12/16/2018   History of chest pain 12/16/2018   Non-insulin  treated type 2 diabetes mellitus (HCC) 12/12/2017   Morbid obesity (HCC) 09/12/2017   Anxiety and depression 06/13/2017   Cervical radiculopathy due to intervertebral  disc disorder 06/13/2017   Chronic low back pain without sciatica 09/18/2015   DJD (degenerative joint disease), lumbar 06/08/2015   Vitamin D  insufficiency 04/25/2014   HTN (hypertension) 06/21/2008     Current Outpatient Medications on File Prior to Visit  Medication Sig Dispense Refill   Accu-Chek Softclix Lancets lancets Use as instructed to check blood sugars once a day.  E11.69,  E66.01 100 each 12   acetaminophen  (TYLENOL  8 HOUR) 650 MG CR tablet Take 1 tablet (650 mg total) by mouth every 8 (eight) hours as needed for pain or fever. 30 tablet 0   aspirin  (ASPIRIN  LOW DOSE) 81 MG EC tablet Take 1 tablet (81 mg total) by mouth daily. Swallow whole. 90 tablet 1   Blood Glucose Monitoring Suppl (ACCU-CHEK GUIDE) w/Device KIT Check blood sugars once a day. E11.69, E66.01 1 kit 0   Cholecalciferol (VITAMIN D3) 2000 UNITS TABS Take 2,000 Units by mouth daily. 30 tablet 11   fluticasone  (FLONASE ) 50 MCG/ACT nasal spray SPRAY 2 SPRAYS INTO EACH NOSTRIL EVERY DAY 48 mL 1   glucose blood (ACCU-CHEK GUIDE) test strip Use as instructed to check blood sugars once a day.  He 11.69, E66.01 100 each 12   Lurasidone  HCl 60 MG TABS Take 1 tablet (60 mg total) by mouth daily with breakfast. 30 tablet 2   metoprolol  tartrate (LOPRESSOR ) 50 MG tablet TAKE 1 TABLET BY MOUTH TWICE A DAY 180 tablet 0   ondansetron  (ZOFRAN ) 4 MG tablet TAKE 1 TABLET BY MOUTH EVERY 8 HOURS AS NEEDED FOR NAUSEA AND VOMITING 20 tablet 0   valsartan  (DIOVAN ) 80 MG tablet TAKE 1 TABLET (80 MG TOTAL) BY MOUTH DAILY. FOR BLOOD PRESSURE 90 tablet 1   diclofenac  Sodium (VOLTAREN ) 1 % GEL Apply 2 g topically 4 (four) times daily. (Patient not taking: Reported on 07/22/2024) 100 g 0   gabapentin  (NEURONTIN ) 300 MG capsule Take 1 capsule (300 mg total) by mouth at bedtime. For nerve pain (Patient not taking: Reported on 07/22/2024) 90 capsule 1   No current facility-administered medications on file prior to visit.    Allergies  Allergen Reactions   Codeine Sulfate Rash   Milk-Related Compounds Diarrhea and Other (See Comments)    Diarrhea and stomach upset    Social History   Socioeconomic History   Marital status: Married    Spouse name: John   Number of children: 2   Years of education: Not on file   Highest education level: Some college, no degree  Occupational History    Comment: home maker  Tobacco Use    Smoking status: Never   Smokeless tobacco: Never  Vaping Use   Vaping status: Never Used  Substance and Sexual Activity   Alcohol use: Yes    Comment: wine every now and then   Drug use: No   Sexual activity: Not Currently  Other Topics Concern   Not on file  Social History Narrative   Lives with husband   Social Drivers of Health   Financial Resource Strain: Low Risk  (08/27/2023)   Overall Financial Resource Strain (CARDIA)    Difficulty of Paying Living Expenses: Not very hard  Food Insecurity: No Food Insecurity (08/27/2023)   Hunger Vital Sign    Worried About Running Out of Food in the Last Year: Never true    Ran Out of Food in the Last Year: Never true  Recent Concern: Food Insecurity - Food Insecurity Present (07/23/2023)   Hunger Vital Sign  Worried About Programme researcher, broadcasting/film/video in the Last Year: Sometimes true    [employer] of Food in the Last Year: Never true  Transportation Needs: No Transportation Needs (08/27/2023)   PRAPARE - Administrator, Civil Service (Medical): No    Lack of Transportation (Non-Medical): No  Physical Activity: Insufficiently Active (08/27/2023)   Exercise Vital Sign    Days of Exercise per Week: 3 days    Minutes of Exercise per Session: 30 min  Stress: No Stress Concern Present (08/27/2023)   [employer] of Occupational Health - Occupational Stress Questionnaire    Feeling of Stress : Not at all  Social Connections: Socially Integrated (08/27/2023)   Social Connection and Isolation Panel    Frequency of Communication with Friends and Family: More than three times a week    Frequency of Social Gatherings with Friends and Family: Once a week    Attends Religious Services: More than 4 times per year    Active Member of Clubs or Organizations: No    Attends Engineer, structural: More than 4 times per year    Marital Status: Married  Catering manager Violence: Not At Risk (07/23/2023)   Humiliation, Afraid, Rape, and  Kick questionnaire    Fear of Current or Ex-Partner: No    Emotionally Abused: No    Physically Abused: No    Sexually Abused: No    Family History  Problem Relation Age of Onset   Hypertension Mother    Schizophrenia Brother    Hypertension Other     Past Surgical History:  Procedure Laterality Date   ABDOMINAL HYSTERECTOMY     BACK SURGERY  2020   lower back   BREAST BIOPSY Right 07/02/2006   US  Core benign   EXPLORATION POST OPERATIVE OPEN HEART  2020   MINIMALLY INVASIVE EXCISION OF ATRIAL MYXOMA N/A 06/08/2019   Procedure: MINIMALLY INVASIVE RESECTION OF LEFT ATRIAL MYXOMA;  Surgeon: Dusty Sudie DEL, MD;  Location: [facility] OR;  Service: Open Heart Surgery;  Laterality: N/A;   NASAL SINUS SURGERY     TEE WITHOUT CARDIOVERSION N/A 05/17/2019   Procedure: TRANSESOPHAGEAL ECHOCARDIOGRAM (TEE);  Surgeon: Alveta Aleene PARAS, MD;  Location: [facility] ENDOSCOPY;  Service: Cardiovascular;  Laterality: N/A;   TEE WITHOUT CARDIOVERSION N/A 06/08/2019   Procedure: TRANSESOPHAGEAL ECHOCARDIOGRAM (TEE);  Surgeon: Dusty Sudie DEL, MD;  Location: [facility] OR;  Service: Open Heart Surgery;  Laterality: N/A;    ROS: Review of Systems Negative except as stated above  PHYSICAL EXAM: BP 121/82 (BP Location: Left Arm, Patient Position: Sitting, Cuff Size: Large)   Pulse 62   Temp 97.9 F (36.6 C) (Oral)   Ht 5' 4 (1.626 m)   Wt 258 lb (117 kg)   LMP 09/01/2013   SpO2 97%   BMI 44.29 kg/m   Wt Readings from Last 3 Encounters:  07/22/24 258 lb (117 kg)  09/03/23 255 lb (115.7 kg)  08/27/23 255 lb (115.7 kg)    Physical Exam  General appearance - alert, well appearing, morbid obese older African-American female and in no distress Mental status - alert, oriented to person, place, and time Neck - supple, no significant adenopathy Chest - clear to auscultation, no wheezes, rales or rhonchi, symmetric air entry Heart - normal rate, regular rhythm, normal S1, S2, no murmurs, rubs, clicks or  gallops Musculoskeletal -right foot: Mild soft tissue edema of the dorsal surface , ankles and toes.  Increased warmth compared to the left foot.  No erythema.  No fluctuance.  Mild discomfort with passive range of motion of the ankle joint Extremities - good DP and PT pulses of both feet. No ulcers/callous of feet      Latest Ref Rng & Units 07/22/2024   12:26 PM 08/16/2023    8:45 AM 05/15/2023    2:24 PM  CMP  Glucose 70 - 99 mg/dL 69  79  868   BUN 8 - 27 mg/dL 8  6  17    Creatinine 0.57 - 1.00 mg/dL 9.30  9.39  9.07   Sodium 134 - 144 mmol/L 140  142  141   Potassium 3.5 - 5.2 mmol/L 4.2  3.4  3.9   Chloride 96 - 106 mmol/L 101  117  101   CO2 20 - 29 mmol/L 23  17  25    Calcium  8.7 - 10.3 mg/dL 9.2  7.0  9.6   Total Protein 6.0 - 8.5 g/dL 7.1  5.2  6.9   Total Bilirubin 0.0 - 1.2 mg/dL 0.3  0.7  0.3   Alkaline Phos 49 - 135 IU/L 153  62  134   AST 0 - 40 IU/L 16  17  20    ALT 0 - 32 IU/L 19  12  17     Lipid Panel     Component Value Date/Time   CHOL 162 07/22/2024 1226   TRIG 129 07/22/2024 1226   HDL 40 07/22/2024 1226   CHOLHDL 4.1 07/22/2024 1226   CHOLHDL 6.1 10/26/2013 0949   VLDL 36 10/26/2013 0949   LDLCALC 99 07/22/2024 1226    CBC    Component Value Date/Time   WBC 8.3 07/22/2024 1226   WBC 7.9 08/16/2023 0845   RBC 4.83 07/22/2024 1226   RBC 3.97 08/16/2023 0845   HGB 12.2 07/22/2024 1226   HCT 38.2 07/22/2024 1226   PLT 362 07/22/2024 1226   MCV 79 07/22/2024 1226   MCH 25.3 (L) 07/22/2024 1226   MCH 26.7 08/16/2023 0845   MCHC 31.9 07/22/2024 1226   MCHC 31.3 08/16/2023 0845   RDW 14.1 07/22/2024 1226   LYMPHSABS 1.5 08/16/2023 0845   LYMPHSABS 2.1 12/05/2021 1025   MONOABS 0.5 08/16/2023 0845   EOSABS 0.1 08/16/2023 0845   EOSABS 0.1 12/05/2021 1025   BASOSABS 0.0 08/16/2023 0845   BASOSABS 0.0 12/05/2021 1025    ASSESSMENT AND PLAN: 1. Type 2 diabetes mellitus with morbid obesity (HCC) (Primary) At goal Discussed trying her with  Rybelsus to assist with weight loss  I went over with pt how the medication works and potential side effects including nausea, vomiting, diarrhea/constipation, bowel blockage, palpitations and pancreatitis.  Advised to stop the medicine and be seen if pt develops any abdominal pain, vomiting, severe diarrhea/constipation or palpitations. Pt agreeable to starting med. If approved, advised to decrease Metformin  to 500 mg once a day after being on the Rybelsus for 2 weeks - POCT glucose (manual entry) - POCT glycosylated hemoglobin (Hb A1C) - metFORMIN  (GLUCOPHAGE ) 500 MG tablet; 1 tablet p.o. twice a day  Dispense: 180 tablet; Refill: 1 - Semaglutide (RYBELSUS) 3 MG TABS; Take 1 tablet (3 mg total) by mouth daily.  Dispense: 30 tablet; Refill: 2 - CBC - Comprehensive metabolic panel with GFR - Lipid panel  2. Diabetes mellitus treated with oral medication (HCC) See # 1 above  3. Hypertension associated with diabetes (HCC) DBP not at goal.Uncertain medication adherence due to possible refill lapses. - Continue amlodipine  and Metoprolol  since she thinks  she is taking these 2 meds. . - Discontinue hydrochlorothiazide  due to lack of recent use and possible gout. - Plan medication reconciliation at next visit in six weeks. Advise to bring all meds that she is currently taking  4. Hyperlipidemia associated with type 2 diabetes mellitus (HCC) Continue Lipitor - atorvastatin  (LIPITOR) 40 MG tablet; Take 1 tablet (40 mg total) by mouth daily.  Dispense: 90 tablet; Refill: 1  5. Acute foot pain, right Acute pain and swelling, possibly gout. No recent injury. Family history of gout noted. - Prescribe colchicine for several days to manage symptoms. - Order uric acid level to assess for gout. - Advise dietary modifications to reduce gout risk, including reducing red meat, shellfish, and red wine intake. - Plan for x-rays if symptoms do not improve with colchicine. - Uric Acid  6. Screening for colon  cancer Discussed colon CA screening recommendations. She prefers cologuard test. - Cologuard  7. Encounter for screening mammogram for malignant neoplasm of breast - MM 3D SCREENING MAMMOGRAM BILATERAL BREAST; Future  8. Need for immunization against influenza - Flu vaccine trivalent PF, 6mos and older(Flulaval,Afluria,Fluarix,Fluzone)  Patient was given the opportunity to ask questions.  Patient verbalized understanding of the plan and was able to repeat key elements of the plan.   This documentation was completed using Paediatric nurse.  Any transcriptional errors are unintentional.  Orders Placed This Encounter  Procedures   MM 3D SCREENING MAMMOGRAM BILATERAL BREAST   Flu vaccine trivalent PF, 6mos and older(Flulaval,Afluria,Fluarix,Fluzone)   CBC   Comprehensive metabolic panel with GFR   Lipid panel   Uric Acid   Cologuard   POCT glucose (manual entry)   POCT glycosylated hemoglobin (Hb A1C)     Requested Prescriptions   Signed Prescriptions Disp Refills   amLODipine  (NORVASC ) 10 MG tablet 90 tablet 1    Sig: Take 1 tablet (10 mg total) by mouth daily.   atorvastatin  (LIPITOR) 40 MG tablet 90 tablet 1    Sig: Take 1 tablet (40 mg total) by mouth daily.   metFORMIN  (GLUCOPHAGE ) 500 MG tablet 180 tablet 1    Sig: 1 tablet p.o. twice a day   montelukast  (SINGULAIR ) 10 MG tablet 90 tablet 0    Sig: Take 1 tablet (10 mg total) by mouth daily.   Semaglutide (RYBELSUS) 3 MG TABS 30 tablet 2    Sig: Take 1 tablet (3 mg total) by mouth daily.    Return in about 4 weeks (around 08/19/2024) for for MWV and med reconciliation.  Barnie Louder, MD, FACP

## 2024-07-22 NOTE — Patient Instructions (Signed)
  VISIT SUMMARY: Today, we reviewed your chronic conditions, including diabetes, hypertension, and hyperlipidemia, and addressed your recent weight gain and new foot pain. We discussed your current medications and made some adjustments to better manage your health.  YOUR PLAN: -TYPE 2 DIABETES MELLITUS: Your diabetes is well-managed with an HbA1c of 6.6%, which is below your target of 7%. Continue taking metformin  as prescribed. We recommend checking your blood sugar at least once daily, alternating between pre-breakfast and pre-dinner. We will submit a request to your insurance for Rybelsus, and if approved, you will start it and reduce metformin  to once daily after two weeks. Please check with your pharmacy for Rybelsus availability next Tuesday or Wednesday.  -ESSENTIAL HYPERTENSION: Your blood pressure is currently at 121/82 mmHg, which is within the goal of less than 130/80 mmHg. Continue taking amlodipine  as prescribed. We will discontinue hydrochlorothiazide  since you haven't been using it recently. We will review all your medications at your next visit in six weeks.  -HYPERLIPIDEMIA: Your cholesterol levels are being managed with atorvastatin . A refill for atorvastatin  40 mg has been sent to your pharmacy.  -OBESITY: We discussed your weight and the possibility of using GLP-1 receptor agonists like Rybelsus for weight loss. We will submit a request to your insurance for Rybelsus. Please monitor for any side effects and discontinue use if you experience severe symptoms.  -ACUTE RIGHT FOOT AND ANKLE PAIN AND SWELLING, POSSIBLE GOUT: You are experiencing pain and swelling in your right foot, which may be due to gout. We have prescribed colchicine for several days to help manage the symptoms and ordered a uric acid level test to check for gout. Please follow dietary modifications to reduce gout risk, including reducing red meat, shellfish, and red wine intake. If your symptoms do not improve with  colchicine, we will plan for x-rays.  -GENERAL HEALTH MAINTENANCE: We administered your flu vaccine today. Please obtain the pneumonia vaccine at your pharmacy. We have ordered a mammogram and a Cologuard kit for colon cancer screening.  INSTRUCTIONS: Please follow up in six weeks for a medication reconciliation and to review your progress. Check with your pharmacy for the availability of Rybelsus next Tuesday or Wednesday. If your foot pain and swelling do not improve with colchicine, please contact us  to arrange for x-rays.                      Contains text generated by [employer]bridge.                                 Contains text generated by [employer]bridge.

## 2024-07-23 ENCOUNTER — Ambulatory Visit: Payer: Self-pay | Admitting: Internal Medicine

## 2024-07-23 LAB — COMPREHENSIVE METABOLIC PANEL WITH GFR
ALT: 19 IU/L (ref 0–32)
AST: 16 IU/L (ref 0–40)
Albumin: 3.9 g/dL (ref 3.9–4.9)
Alkaline Phosphatase: 153 IU/L — ABNORMAL HIGH (ref 49–135)
BUN/Creatinine Ratio: 12 (ref 12–28)
BUN: 8 mg/dL (ref 8–27)
Bilirubin Total: 0.3 mg/dL (ref 0.0–1.2)
CO2: 23 mmol/L (ref 20–29)
Calcium: 9.2 mg/dL (ref 8.7–10.3)
Chloride: 101 mmol/L (ref 96–106)
Creatinine, Ser: 0.69 mg/dL (ref 0.57–1.00)
Globulin, Total: 3.2 g/dL (ref 1.5–4.5)
Glucose: 69 mg/dL — ABNORMAL LOW (ref 70–99)
Potassium: 4.2 mmol/L (ref 3.5–5.2)
Sodium: 140 mmol/L (ref 134–144)
Total Protein: 7.1 g/dL (ref 6.0–8.5)
eGFR: 98 mL/min/1.73 (ref >59)

## 2024-07-23 LAB — LIPID PANEL
Chol/HDL Ratio: 4.1 ratio (ref 0.0–4.4)
Cholesterol, Total: 162 mg/dL (ref 100–199)
HDL: 40 mg/dL (ref >39)
LDL Chol Calc (NIH): 99 mg/dL (ref 0–99)
Triglycerides: 129 mg/dL (ref 0–149)
VLDL Cholesterol Cal: 23 mg/dL (ref 5–40)

## 2024-07-23 LAB — CBC
Hematocrit: 38.2 % (ref 34.0–46.6)
Hemoglobin: 12.2 g/dL (ref 11.1–15.9)
MCH: 25.3 pg — ABNORMAL LOW (ref 26.6–33.0)
MCHC: 31.9 g/dL (ref 31.5–35.7)
MCV: 79 fL (ref 79–97)
Platelets: 362 x10E3/uL (ref 150–450)
RBC: 4.83 x10E6/uL (ref 3.77–5.28)
RDW: 14.1 % (ref 11.7–15.4)
WBC: 8.3 x10E3/uL (ref 3.4–10.8)

## 2024-07-23 LAB — URIC ACID: Uric Acid: 8.5 mg/dL — ABNORMAL HIGH (ref 3.0–7.2)

## 2024-07-24 ENCOUNTER — Encounter: Payer: Self-pay | Admitting: Internal Medicine

## 2024-07-27 ENCOUNTER — Ambulatory Visit: Payer: 59 | Attending: Internal Medicine

## 2024-07-27 VITALS — BP 121/82 | Ht 64.0 in | Wt 257.0 lb

## 2024-07-27 DIAGNOSIS — Z0000 Encounter for general adult medical examination without abnormal findings: Secondary | ICD-10-CM | POA: Diagnosis not present

## 2024-07-27 NOTE — Patient Instructions (Signed)
 Ms. Demers,  Thank you for taking the time for your Medicare Wellness Visit. I appreciate your continued commitment to your health goals. Please review the care plan we discussed, and feel free to reach out if I can assist you further.  Medicare recommends these wellness visits once per year to help you and your care team stay ahead of potential health issues. These visits are designed to focus on prevention, allowing your provider to concentrate on managing your acute and chronic conditions during your regular appointments.  Please note that Annual Wellness Visits do not include a physical exam. Some assessments may be limited, especially if the visit was conducted virtually. If needed, we may recommend a separate in-person follow-up with your provider.  Ongoing Care Seeing your primary care provider every 3 to 6 months helps us  monitor your health and provide consistent, personalized care.   Referrals If a referral was made during today's visit and you haven't received any updates within two weeks, please contact the referred provider directly to check on the status.  Recommended Screenings:  Health Maintenance  Topic Date Due   Pneumococcal Vaccine for age over 15 (2 of 2 - PCV) 10/20/2019   Cologuard (Stool DNA test)  02/24/2024   Yearly kidney health urinalysis for diabetes  05/14/2024   Complete foot exam   05/14/2024   COVID-19 Vaccine (4 - 2025-26 season) 06/14/2024   Breast Cancer Screening  06/04/2024   Eye exam for diabetics  10/30/2024   Hemoglobin A1C  01/20/2025   Yearly kidney function blood test for diabetes  07/22/2025   Medicare Annual Wellness Visit  07/27/2025   DTaP/Tdap/Td vaccine (2 - Td or Tdap) 09/03/2026   Flu Shot  Completed   Hepatitis C Screening  Completed   HIV Screening  Completed   Zoster (Shingles) Vaccine  Completed   Hepatitis B Vaccine  Aged Out   HPV Vaccine  Aged Out   Meningitis B Vaccine  Aged Out       07/27/2024    3:57 PM   Advanced Directives  Does Patient Have a Medical Advance Directive? No  Would patient like information on creating a medical advance directive? No - Patient declined   Advance Care Planning is important because it: Ensures you receive medical care that aligns with your values, goals, and preferences. Provides guidance to your family and loved ones, reducing the emotional burden of decision-making during critical moments.  Vision: Annual vision screenings are recommended for early detection of glaucoma, cataracts, and diabetic retinopathy. These exams can also reveal signs of chronic conditions such as diabetes and high blood pressure.  Dental: Annual dental screenings help detect early signs of oral cancer, gum disease, and other conditions linked to overall health, including heart disease and diabetes.  Please see the attached documents for additional preventive care recommendations.

## 2024-07-27 NOTE — Progress Notes (Signed)
 Because this visit was a virtual/telehealth visit,  certain criteria was not obtained, such a blood pressure, CBG if applicable, and timed get up and go. Any medications not marked as taking were not mentioned during the medication reconciliation part of the visit. Any vitals not documented were not able to be obtained due to this being a telehealth visit or patient was unable to self-report a recent blood pressure reading due to a lack of equipment at home via telehealth. Vitals that have been documented are verbally provided by the patient.  This visit was performed by a medical professional under my direct supervision. I was immediately available for consultation/collaboration. I have reviewed and agree with the Annual Wellness Visit documentation.  Subjective:   Michele Wood is a 63 y.o. who presents for a Medicare Wellness preventive visit.  As a reminder, Annual Wellness Visits don't include a physical exam, and some assessments may be limited, especially if this visit is performed virtually. We may recommend an in-person follow-up visit with your provider if needed.  Visit Complete: Virtual I connected with  Michele Wood on 07/27/24 by a audio enabled telemedicine application and verified that I am speaking with the correct person using two identifiers.  Patient Location: Home  Provider Location: Home Office  I discussed the limitations of evaluation and management by telemedicine. The patient expressed understanding and agreed to proceed.  Vital Signs: Because this visit was a virtual/telehealth visit, some criteria may be missing or patient reported. Any vitals not documented were not able to be obtained and vitals that have been documented are patient reported.  VideoDeclined- This patient declined Librarian, academic. Therefore the visit was completed with audio only.  Persons Participating in Visit: Patient.  AWV Questionnaire: No: Patient  Medicare AWV questionnaire was not completed prior to this visit.  Cardiac Risk Factors include: advanced age (>26men, >64 women);obesity (BMI >30kg/m2);diabetes mellitus;hypertension;dyslipidemia     Objective:    Today's Vitals   07/27/24 1558 07/27/24 1559  BP: 121/82   Weight: 257 lb (116.6 kg)   Height: 5' 4 (1.626 m)   PainSc:  4    Body mass index is 44.11 kg/m.     07/27/2024    3:57 PM 08/16/2023    8:25 AM 07/23/2023    9:31 AM 03/06/2023    9:26 AM 06/18/2022   11:19 AM 06/10/2022    2:37 PM 02/13/2021    2:47 PM  Advanced Directives  Does Patient Have a Medical Advance Directive? No No Yes No No No No  Type of Surveyor, minerals;[place]      Does patient want to make changes to medical advance directive?   No - Patient declined      Copy of Healthcare Power of Attorney in Chart?   Yes - validated most recent copy scanned in chart (See row information)      Would patient like information on creating a medical advance directive? No - Patient declined     No - Patient declined No - Patient declined    Current Medications (verified) Outpatient Encounter Medications as of 07/27/2024  Medication Sig   Accu-Chek Softclix Lancets lancets Use as instructed to check blood sugars once a day.  E11.69, E66.01   acetaminophen  (TYLENOL  8 HOUR) 650 MG CR tablet Take 1 tablet (650 mg total) by mouth every 8 (eight) hours as needed for pain or fever.   amLODipine  (NORVASC ) 10 MG tablet Take 1  tablet (10 mg total) by mouth daily.   aspirin  (ASPIRIN  LOW DOSE) 81 MG EC tablet Take 1 tablet (81 mg total) by mouth daily. Swallow whole.   atorvastatin  (LIPITOR) 40 MG tablet Take 1 tablet (40 mg total) by mouth daily.   Blood Glucose Monitoring Suppl (ACCU-CHEK GUIDE) w/Device KIT Check blood sugars once a day. E11.69, E66.01   Cholecalciferol (VITAMIN D3) 2000 UNITS TABS Take 2,000 Units by mouth daily.   fluticasone  (FLONASE ) 50 MCG/ACT nasal spray SPRAY 2  SPRAYS INTO EACH NOSTRIL EVERY DAY   glucose blood (ACCU-CHEK GUIDE) test strip Use as instructed to check blood sugars once a day.  He 11.69, E66.01   Lurasidone  HCl 60 MG TABS Take 1 tablet (60 mg total) by mouth daily with breakfast.   metFORMIN  (GLUCOPHAGE ) 500 MG tablet 1 tablet p.o. twice a day   metoprolol  tartrate (LOPRESSOR ) 50 MG tablet TAKE 1 TABLET BY MOUTH TWICE A DAY   montelukast  (SINGULAIR ) 10 MG tablet Take 1 tablet (10 mg total) by mouth daily.   ondansetron  (ZOFRAN ) 4 MG tablet TAKE 1 TABLET BY MOUTH EVERY 8 HOURS AS NEEDED FOR NAUSEA AND VOMITING   Semaglutide (RYBELSUS) 3 MG TABS Take 1 tablet (3 mg total) by mouth daily.   valsartan  (DIOVAN ) 80 MG tablet TAKE 1 TABLET (80 MG TOTAL) BY MOUTH DAILY. FOR BLOOD PRESSURE   diclofenac  Sodium (VOLTAREN ) 1 % GEL Apply 2 g topically 4 (four) times daily. (Patient not taking: Reported on 07/27/2024)   gabapentin  (NEURONTIN ) 300 MG capsule Take 1 capsule (300 mg total) by mouth at bedtime. For nerve pain (Patient not taking: Reported on 07/27/2024)   No facility-administered encounter medications on file as of 07/27/2024.    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 06/08/2019   Sinusitis    Sleep apnea    at age 67   Past Surgical History:  Procedure Laterality Date   ABDOMINAL HYSTERECTOMY     BACK SURGERY  2020   lower back   BREAST BIOPSY Right 07/02/2006   US  Core benign   EXPLORATION POST OPERATIVE OPEN HEART  2020   MINIMALLY INVASIVE EXCISION OF ATRIAL MYXOMA N/A 06/08/2019   Procedure: MINIMALLY INVASIVE RESECTION OF LEFT  ATRIAL MYXOMA;  Surgeon: Dusty Sudie DEL, MD;  Location: [facility] OR;  Service: Open Heart Surgery;  Laterality: N/A;   NASAL SINUS SURGERY     TEE WITHOUT CARDIOVERSION N/A 05/17/2019   Procedure: TRANSESOPHAGEAL ECHOCARDIOGRAM (TEE);  Surgeon: Alveta Aleene PARAS, MD;  Location: [facility] ENDOSCOPY;  Service: Cardiovascular;  Laterality: N/A;   TEE WITHOUT CARDIOVERSION N/A 06/08/2019   Procedure: TRANSESOPHAGEAL ECHOCARDIOGRAM (TEE);  Surgeon: Dusty Sudie DEL, MD;  Location: [facility] OR;  Service: Open Heart Surgery;  Laterality: N/A;   Family History  Problem Relation Age of Onset   Hypertension Mother    Schizophrenia Brother    Hypertension Other    Social History   Socioeconomic History   Marital status: Married    Spouse name: John   Number of children: 2   Years of education: Not  on file   Highest education level: Some college, no degree  Occupational History    Comment: home maker  Tobacco Use   Smoking status: Never   Smokeless tobacco: Never  Vaping Use   Vaping status: Never Used  Substance and Sexual Activity   Alcohol use: Yes    Comment: wine every now and then   Drug use: No   Sexual activity: Not Currently  Other Topics Concern   Not on file  Social History Narrative   Lives with husband   Social Drivers of Health   Financial Resource Strain: Low Risk  (07/27/2024)   Overall Financial Resource Strain (CARDIA)    Difficulty of Paying Living Expenses: Not very hard  Food Insecurity: No Food Insecurity (07/27/2024)   Hunger Vital Sign    Worried About Running Out of Food in the Last Year: Never true    Ran Out of Food in the Last Year: Never true  Transportation Needs: No Transportation Needs (07/27/2024)   PRAPARE - Administrator, Civil Service (Medical): No    Lack of Transportation (Non-Medical): No  Physical Activity: Insufficiently Active (07/27/2024)   Exercise Vital Sign    Days of Exercise per Week: 3 days    Minutes of Exercise per Session: 30 min   Stress: No Stress Concern Present (07/27/2024)   [employer] of Occupational Health - Occupational Stress Questionnaire    Feeling of Stress: Not at all  Social Connections: Socially Integrated (07/27/2024)   Social Connection and Isolation Panel    Frequency of Communication with Friends and Family: More than three times a week    Frequency of Social Gatherings with Friends and Family: Once a week    Attends Religious Services: More than 4 times per year    Active Member of [employer] or Organizations: No    Attends Engineer, structural: More than 4 times per year    Marital Status: Married    Tobacco Counseling Counseling given: Not Answered    Clinical Intake:  Pre-visit preparation completed: Yes  Pain : 0-10 Pain Score: 4  Pain Type: Acute pain Pain Location: Foot Pain Orientation: Right Pain Descriptors / Indicators: Sore Pain Onset: Today Pain Frequency: Occasional     BMI - recorded: 44.11 Nutritional Status: BMI > 30  Obese Nutritional Risks: None Diabetes: Yes CBG done?: No Did pt. bring in CBG monitor from home?: No  Lab Results  Component Value Date   HGBA1C 6.6 07/22/2024   HGBA1C 6.4 05/15/2023   HGBA1C 6.0 09/10/2022     How often do you need to have someone help you when you read instructions, pamphlets, or other written materials from your doctor or pharmacy?: 1 - Never  Interpreter Needed?: No  Information entered by :: Isay Perleberg,CMA   Activities of Daily Living     07/27/2024    4:01 PM  In your present state of health, do you have any difficulty performing the following activities:  Hearing? 0  Vision? 0  Difficulty concentrating or making decisions? 1  Walking or climbing stairs? 0  Dressing or bathing? 0  Doing errands, shopping? 0  Preparing Food and eating ? N  Using the Toilet? N  In the past six months, have you accidently leaked urine? N  Do you have problems with loss of bowel control? N  Managing your  Medications? N  Managing your Finances? N  Housekeeping or managing your Housekeeping? N    Patient Care Team: Vicci,  Barnie NOVAK, MD as PCP - General (Internal Medicine) Court Dorn PARAS, MD as PCP - Cardiology (Cardiology)  I have updated your Care Teams any recent Medical Services you may have received from other providers in the past year.     Assessment:   This is a routine wellness examination for Carli.  Hearing/Vision screen Hearing Screening - Comments:: No difficulties  Vision Screening - Comments:: Patient wears glasses    Goals Addressed             This Visit's Progress    Patient Stated       Patient would like to lose some weight        Depression Screen     07/27/2024    4:02 PM 07/22/2024   11:16 AM 09/03/2023    2:50 PM 08/27/2023    9:56 AM 07/23/2023    9:29 AM 05/15/2023    1:32 PM 01/22/2023   10:57 AM  PHQ 2/9 Scores  PHQ - 2 Score 0 0 2 2 0 0 1  PHQ- 9 Score 0 1 2 2   4     Fall Risk     07/27/2024    4:01 PM 07/22/2024   11:15 AM 08/27/2023    9:56 AM 07/23/2023    9:30 AM 01/22/2023    9:24 AM  Fall Risk   Falls in the past year? 0 0 0 0 0  Number falls in past yr: 0 0 0 0 0  Injury with Fall? 0 0 0 0   Risk for fall due to : No Fall Risks No Fall Risks No Fall Risks No Fall Risks No Fall Risks  Follow up Falls evaluation completed Falls evaluation completed Falls evaluation completed Falls prevention discussed;Education provided;Falls evaluation completed Falls evaluation completed    MEDICARE RISK AT HOME:  Medicare Risk at Home Any stairs in or around the home?: Yes If so, are there any without handrails?: No Home free of loose throw rugs in walkways, pet beds, electrical cords, etc?: Yes Adequate lighting in your home to reduce risk of falls?: Yes Life alert?: No Use of a cane, walker or w/c?: No Grab bars in the bathroom?: No Shower chair or bench in shower?: No Elevated toilet seat or a handicapped toilet?: No  TIMED  UP AND GO:  Was the test performed?  No  Cognitive Function: 6CIT completed    06/10/2022    2:42 PM 02/13/2021    2:47 PM  MMSE - Mini Mental State Exam  Orientation to time  5  Orientation to Place 5 5  Registration 3 3  Attention/ Calculation 5 5  Recall 3 3  Language- name 2 objects 2 2  Language- repeat 1 1  Language- follow 3 step command 3 3  Language- read & follow direction 1 1  Write a sentence 1 1  Copy design 1 0  Total score  29        07/27/2024    4:02 PM 07/23/2023    9:31 AM  6CIT Screen  What Year? 0 points 0 points  What month? 0 points 0 points  What time? 0 points 0 points  Count back from 20 0 points 0 points  Months in reverse 0 points 0 points  Repeat phrase 0 points 0 points  Total Score 0 points 0 points    Immunizations Immunization History  Administered Date(s) Administered   Influenza Inj Mdck Quad With Preservative 08/27/2019   Influenza Whole 09/13/2008, 08/29/2019  Influenza, Seasonal, Injecte, Preservative Fre 07/22/2024   Influenza,inj,Quad PF,6+ Mos 09/22/2014, 09/18/2015, 08/22/2016, 06/13/2017, 06/19/2018, 08/18/2020   Influenza-Unspecified 08/08/2022, 07/09/2023   PFIZER(Purple Top)SARS-COV-2 Vaccination 01/25/2020, 02/22/2020   Pneumococcal Polysaccharide-23 10/19/2018   Tdap 09/03/2016   Unspecified SARS-COV-2 Vaccination 07/09/2023   Zoster Recombinant(Shingrix) 04/05/2021, 07/18/2021    Screening Tests Health Maintenance  Topic Date Due   Pneumococcal Vaccine: 50+ Years (2 of 2 - PCV) 10/20/2019   Fecal DNA (Cologuard)  02/24/2024   Diabetic kidney evaluation - Urine ACR  05/14/2024   FOOT EXAM  05/14/2024   COVID-19 Vaccine (4 - 2025-26 season) 06/14/2024   Mammogram  06/04/2024   OPHTHALMOLOGY EXAM  10/30/2024   HEMOGLOBIN A1C  01/20/2025   Diabetic kidney evaluation - eGFR measurement  07/22/2025   Medicare Annual Wellness (AWV)  07/27/2025   DTaP/Tdap/Td (2 - Td or Tdap) 09/03/2026   Influenza Vaccine   Completed   Hepatitis C Screening  Completed   HIV Screening  Completed   Zoster Vaccines- Shingrix  Completed   Hepatitis B Vaccines 19-59 Average Risk  Aged Out   HPV VACCINES  Aged Out   Meningococcal B Vaccine  Aged Out    Health Maintenance Items Addressed:patient declined   Additional Screening:  Vision Screening: Recommended annual ophthalmology exams for early detection of glaucoma and other disorders of the eye. Is the patient up to date with their annual eye exam?  yes   Dental Screening: Recommended annual dental exams for proper oral hygiene  Community Resource Referral / Chronic Care Management: CRR required this visit?  No   CCM required this visit?  No   Plan:    I have personally reviewed and noted the following in the patient's chart:   Medical and social history Use of alcohol, tobacco or illicit drugs  Current medications and supplements including opioid prescriptions. Patient is not currently taking opioid prescriptions. Functional ability and status Nutritional status Physical activity Advanced directives List of other physicians Hospitalizations, surgeries, and ER visits in previous 12 months Vitals Screenings to include cognitive, depression, and falls Referrals and appointments  In addition, I have reviewed and discussed with patient certain preventive protocols, quality metrics, and best practice recommendations. A written personalized care plan for preventive services as well as general preventive health recommendations were provided to patient.   Lyle MARLA Right, [place]   07/27/2024   After Visit Summary: (MyChart) Due to this being a telephonic visit, the after visit summary with patients personalized plan was offered to patient via MyChart   Notes: Nothing significant to report at this time.

## 2024-08-08 LAB — COLOGUARD: COLOGUARD: NEGATIVE

## 2024-08-11 ENCOUNTER — Encounter: Payer: Self-pay | Admitting: Podiatry

## 2024-08-11 ENCOUNTER — Ambulatory Visit (INDEPENDENT_AMBULATORY_CARE_PROVIDER_SITE_OTHER): Admitting: Podiatry

## 2024-08-11 DIAGNOSIS — M79674 Pain in right toe(s): Secondary | ICD-10-CM

## 2024-08-11 DIAGNOSIS — E1151 Type 2 diabetes mellitus with diabetic peripheral angiopathy without gangrene: Secondary | ICD-10-CM | POA: Diagnosis not present

## 2024-08-11 DIAGNOSIS — M7672 Peroneal tendinitis, left leg: Secondary | ICD-10-CM

## 2024-08-11 DIAGNOSIS — M109 Gout, unspecified: Secondary | ICD-10-CM

## 2024-08-11 DIAGNOSIS — M79675 Pain in left toe(s): Secondary | ICD-10-CM

## 2024-08-11 DIAGNOSIS — B351 Tinea unguium: Secondary | ICD-10-CM

## 2024-08-11 MED ORDER — METHYLPREDNISOLONE 4 MG PO TBPK: ORAL_TABLET | ORAL | 0 refills | Status: DC

## 2024-08-11 NOTE — Progress Notes (Signed)
 Subjective:  Patient ID: Michele Wood, female    DOB: 1961-07-17,   MRN: [id number]  Chief Complaint  Patient presents with   Tendonitis    L foot is feeling better after injection. R foot is sore and 2nd toe is swollen.  Diabetic A1c 6.6.  no anti coag Requesting nail trim    63 y.o. female presents for concern of left foot pain peroneal tendonitis .  Relates the left ankle is doing better after the injection and having no pain in this area.  She does relate now having pain in the right foot and around the right second toe that has been swollen and red.  She does relate this is worse and has gotten better with time.  She was seen by PCP and told that she had gout and was told she would get medication sent to pharmacy but never received.  Also concern of thickened elongated and painful nails that are difficult to trim. Requesting to have them trimmed today. Relates burning and tingling in their feet. Patient is diabetic and last A1c was  Lab Results  Component Value Date   HGBA1C 6.6 07/22/2024   .   PCP:  Vicci Barnie NOVAK, MD   Denies any other pedal complaints. Denies n/v/f/c.   Past Medical History:  Diagnosis Date   Cervical radiculopathy    Chronic back pain    DDD (degenerative disc disease), cervical    Depression    Diabetes mellitus without complication (HCC)    Hypertension    at age 71   Obesity    Pancreatic cyst 05/31/2019   Incidental - discovered on CT scan   s/p minimally-invasive resection of left atrial myxoma 06/08/2019   Sinusitis    Sleep apnea    at age 52    Objective:  Physical Exam: Vascular: DP/PT pulses 1/4 bilateral. CFT <3 seconds. Normal hair growth on digits. Edema to left foot.  Skin. No lacerations or abrasions bilateral feet.  Nails 1 through 5 bilateral thickened elongated and dystrophic. Musculoskeletal: MMT 5/5 bilateral lower extremities in DF, PF, Inversion and Eversion. Deceased ROM in DF of ankle joint.  Nontender  to insertion  of peroneal tendon at baser of fifth metatarsal and pain along the tendon to lateral malleolus. Edema noted in area. Pain with eversion and PF.  Edema and mild erythema noted to right second metatarsal phalangeal joint and proximal interphalangeal joint.  Tenderness to palpation. Neurological: Sensation intact to light touch.   Assessment:   1. Peroneal tendinitis of left lower extremity   2. Pain due to onychomycosis of toenails of both feet   3. Type 2 diabetes with decreased circulation (HCC)   4. Acute gouty arthritis       Plan:  Patient was evaluated and treated and all questions answered. X-rays reviewed and discussed with patient. Discussed peroneal tendinitis and treatment options at length with patient This seems to have resolved itself. -Discussed treatement options for gouty arthritis and gout education provided. -Patient deferred injection.. -Discussed diet and modifications.  -Rx Medrol  Dosepak -Discussed and educated patient on diabetic foot care, especially with  regards to the vascular, neurological and musculoskeletal systems.  -Stressed the importance of good glycemic control and the detriment of not  controlling glucose levels in relation to the foot. -Discussed supportive shoes at all times and checking feet regularly.  -Mechanically debrided all nails 1-5 bilateral using sterile nail nipper and filed with dremel without incident  -Answered all patient questions -Patient to  return in 3 weeks for re-check/further discussion for long term management of gout or sooner if condition worsens.    Asberry Failing, DPM

## 2024-08-19 ENCOUNTER — Ambulatory Visit: Attending: Internal Medicine | Admitting: Internal Medicine

## 2024-08-19 VITALS — BP 127/83 | HR 73 | Temp 98.1°F | Ht 64.0 in | Wt 252.0 lb

## 2024-08-19 DIAGNOSIS — J324 Chronic pansinusitis: Secondary | ICD-10-CM

## 2024-08-19 DIAGNOSIS — Z6841 Body Mass Index (BMI) 40.0 and over, adult: Secondary | ICD-10-CM

## 2024-08-19 DIAGNOSIS — Z79899 Other long term (current) drug therapy: Secondary | ICD-10-CM

## 2024-08-19 DIAGNOSIS — I152 Hypertension secondary to endocrine disorders: Secondary | ICD-10-CM

## 2024-08-19 DIAGNOSIS — E1169 Type 2 diabetes mellitus with other specified complication: Secondary | ICD-10-CM

## 2024-08-19 DIAGNOSIS — E1159 Type 2 diabetes mellitus with other circulatory complications: Secondary | ICD-10-CM

## 2024-08-19 DIAGNOSIS — Z7982 Long term (current) use of aspirin: Secondary | ICD-10-CM

## 2024-08-19 DIAGNOSIS — Z139 Encounter for screening, unspecified: Secondary | ICD-10-CM

## 2024-08-19 DIAGNOSIS — Z23 Encounter for immunization: Secondary | ICD-10-CM | POA: Diagnosis not present

## 2024-08-19 DIAGNOSIS — Z7984 Long term (current) use of oral hypoglycemic drugs: Secondary | ICD-10-CM

## 2024-08-19 DIAGNOSIS — Z7902 Long term (current) use of antithrombotics/antiplatelets: Secondary | ICD-10-CM

## 2024-08-19 NOTE — Progress Notes (Signed)
 Patient ID: Michele Wood, female    DOB: 07-05-1961  MRN: [id number]  CC: Follow-up (Medication reconciliation. Michele Wood not taking lurasidone  due to not receiving from pharmacy/Reports Singulair  not alleviating sinus issues - currently using mucinex spray )   Subjective: Michele Wood is a 63 y.o. female who presents for chronic ds management. Her concerns today include:    Discussed the use of AI scribe software for clinical note transcription with the patient, who gave verbal consent to proceed.  History of Present Illness Michele Wood is a 63 year old female with hypertension and diabetes who presents for medication reconciliation and sinus issues. She is accompanied by her husband.  She is here for medication reconciliation. She was previously unsure about her blood pressure medications on last visit 1 mth ago. We left her on Norvasc  and Metoprolol  for BP as we were not able to verify if she also had the hydrochlorothiazide  and Valsartan .  She took a video of all the meds that she is currently taking at home to show to me today. She has been taking amlodipine  10 mg daily, metoprolol  50 mg twice a day, valsartan  80 mg daily, atorvastatin  40 mg daily, and metformin  500 mg twice a day. She is not taking hydrochlorothiazide .   On last visit we added Rybelsus  for diabetes and weight loss but experienced severe vomiting and diarrhea after one dose, leading her to discontinue it. She has since increased her physical activity by walking twice daily and has made dietary changes to reduce her food intake and improve her nutrition.  She reports ongoing sinus issues despite previous sinus surgeries in the 1990s. She experiences 'big clots of mucus' from the nostrils and nasal congestion. She has tried various treatments including Singulair , Flonase , Claritin , Allegra, nasal rinses, and Mucinex, with limited success. Her right side is more congested, and she experiences ear pain and  tinnitus on that side at times. No blood in the mucus has been noted.  She may be facing housing insecurity in the near future.      Patient Active Problem List   Diagnosis Date Noted   Hyperlipidemia associated with type 2 diabetes mellitus (HCC) 02/28/2020   Hypotension 07/19/2019   CAD (coronary artery disease) 06/23/2019   S/P minimally-invasive resection of left atrial myxoma 06/08/2019   Pancreatic cyst 05/31/2019   Spondylolisthesis of lumbar region 03/31/2019   Atrial myxoma 03/19/2019   Dyslipidemia, goal LDL below 70 12/16/2018   Family history of heart disease 12/16/2018   Left bundle branch block 12/16/2018   History of chest pain 12/16/2018   Non-insulin  treated type 2 diabetes mellitus (HCC) 12/12/2017   Morbid obesity (HCC) 09/12/2017   Anxiety and depression 06/13/2017   Cervical radiculopathy due to intervertebral disc disorder 06/13/2017   Chronic low back pain without sciatica 09/18/2015   DJD (degenerative joint disease), lumbar 06/08/2015   Vitamin D  insufficiency 04/25/2014   HTN (hypertension) 06/21/2008     Current Outpatient Medications on File Prior to Visit  Medication Sig Dispense Refill   Accu-Chek Softclix Lancets lancets Use as instructed to check blood sugars once a day.  E11.69, E66.01 100 each 12   acetaminophen  (TYLENOL  8 HOUR) 650 MG CR tablet Take 1 tablet (650 mg total) by mouth every 8 (eight) hours as needed for pain or fever. 30 tablet 0   amLODipine  (NORVASC ) 10 MG tablet Take 1 tablet (10 mg total) by mouth daily. 90 tablet 1   aspirin  (ASPIRIN  LOW DOSE) 81  MG EC tablet Take 1 tablet (81 mg total) by mouth daily. Swallow whole. 90 tablet 1   atorvastatin  (LIPITOR) 40 MG tablet Take 1 tablet (40 mg total) by mouth daily. 90 tablet 1   Blood Glucose Monitoring Suppl (ACCU-CHEK GUIDE) w/Device KIT Check blood sugars once a day. E11.69, E66.01 1 kit 0   Cholecalciferol (VITAMIN D3) 2000 UNITS TABS Take 2,000 Units by mouth daily. (Patient  taking differently: Take 2,000 Units by mouth daily. Taking intermittently) 30 tablet 11   glucose blood (ACCU-CHEK GUIDE) test strip Use as instructed to check blood sugars once a day.  He 11.69, E66.01 100 each 12   metFORMIN  (GLUCOPHAGE ) 500 MG tablet 1 tablet p.o. twice a day 180 tablet 1   metoprolol  tartrate (LOPRESSOR ) 50 MG tablet TAKE 1 TABLET BY MOUTH TWICE A DAY 180 tablet 0   valsartan  (DIOVAN ) 80 MG tablet TAKE 1 TABLET (80 MG TOTAL) BY MOUTH DAILY. FOR BLOOD PRESSURE 90 tablet 1   No current facility-administered medications on file prior to visit.    Allergies  Allergen Reactions   Semaglutide Other (See Comments)    Vomiting and diarrhea x3 days with just one dose of Rybelsus   Codeine Sulfate Rash   Milk-Related Compounds Diarrhea and Other (See Comments)    Diarrhea and stomach upset    Social History   Socioeconomic History   Marital status: Married    Spouse name: John   Number of children: 2   Years of education: Not on file   Highest education level: Some college, no degree  Occupational History    Comment: home maker  Tobacco Use   Smoking status: Never   Smokeless tobacco: Never  Vaping Use   Vaping status: Never Used  Substance and Sexual Activity   Alcohol use: Yes    Comment: wine every now and then   Drug use: No   Sexual activity: Not Currently  Other Topics Concern   Not on file  Social History Narrative   Lives with husband   Social Drivers of Health   Financial Resource Strain: Low Risk  (08/19/2024)   Overall Financial Resource Strain (CARDIA)    Difficulty of Paying Living Expenses: Not very hard  Food Insecurity: No Food Insecurity (08/19/2024)   Hunger Vital Sign    Worried About Running Out of Food in the Last Year: Never true    Ran Out of Food in the Last Year: Never true  Transportation Needs: No Transportation Needs (08/19/2024)   PRAPARE - Administrator, Civil Service (Medical): No    Lack of Transportation  (Non-Medical): No  Physical Activity: Insufficiently Active (08/19/2024)   Exercise Vital Sign    Days of Exercise per Week: 3 days    Minutes of Exercise per Session: 30 min  Stress: No Stress Concern Present (08/19/2024)   [employer] of Occupational Health - Occupational Stress Questionnaire    Feeling of Stress: Only a little  Social Connections: Socially Integrated (08/19/2024)   Social Connection and Isolation Panel    Frequency of Communication with Friends and Family: More than three times a week    Frequency of Social Gatherings with Friends and Family: Once a week    Attends Religious Services: More than 4 times per year    Active Member of [employer] or Organizations: No    Attends Engineer, Structural: More than 4 times per year    Marital Status: Married  Catering Manager Violence: Not At  Risk (08/19/2024)   Humiliation, Afraid, Rape, and Kick questionnaire    Fear of Current or Ex-Partner: No    Emotionally Abused: No    Physically Abused: No    Sexually Abused: No    Family History  Problem Relation Age of Onset   Hypertension Mother    Schizophrenia Brother    Hypertension Other     Past Surgical History:  Procedure Laterality Date   ABDOMINAL HYSTERECTOMY     BACK SURGERY  2020   lower back   BREAST BIOPSY Right 07/02/2006   US  Core benign   EXPLORATION POST OPERATIVE OPEN HEART  2020   MINIMALLY INVASIVE EXCISION OF ATRIAL MYXOMA N/A 06/08/2019   Procedure: MINIMALLY INVASIVE RESECTION OF LEFT ATRIAL MYXOMA;  Surgeon: Dusty Sudie DEL, MD;  Location: [facility] OR;  Service: Open Heart Surgery;  Laterality: N/A;   NASAL SINUS SURGERY     TEE WITHOUT CARDIOVERSION N/A 05/17/2019   Procedure: TRANSESOPHAGEAL ECHOCARDIOGRAM (TEE);  Surgeon: Alveta Aleene PARAS, MD;  Location: [facility] ENDOSCOPY;  Service: Cardiovascular;  Laterality: N/A;   TEE WITHOUT CARDIOVERSION N/A 06/08/2019   Procedure: TRANSESOPHAGEAL ECHOCARDIOGRAM (TEE);  Surgeon: Dusty Sudie DEL, MD;   Location: [facility] OR;  Service: Open Heart Surgery;  Laterality: N/A;    ROS: Review of Systems Negative except as stated above  PHYSICAL EXAM: BP 127/83 (BP Location: Left Arm, Patient Position: Sitting, Cuff Size: Normal)   Pulse 73   Temp 98.1 F (36.7 C) (Oral)   Ht 5' 4 (1.626 m)   Wt 252 lb (114.3 kg)   LMP 09/01/2013   SpO2 97%   BMI 43.26 kg/m   Wt Readings from Last 3 Encounters:  08/19/24 252 lb (114.3 kg)  07/27/24 257 lb (116.6 kg)  07/22/24 258 lb (117 kg)    Physical Exam  General appearance - alert, well appearing, obese older AAF and in no distress. She ambulates with a rollator walker Mental status - normal mood, behavior, speech, dress, motor activity, and thought processes Ears - bilateral TM's and external ear canals normal Nose - normal and patent, no erythema, discharge or polyps Chest - clear to auscultation, no wheezes, rales or rhonchi, symmetric air entry Heart - normal rate, regular rhythm, normal S1, S2, no murmurs, rubs, clicks or gallops      Latest Ref Rng & Units 07/22/2024   12:26 PM 08/16/2023    8:45 AM 05/15/2023    2:24 PM  CMP  Glucose 70 - 99 mg/dL 69  79  868   BUN 8 - 27 mg/dL 8  6  17    Creatinine 0.57 - 1.00 mg/dL 9.30  9.39  9.07   Sodium 134 - 144 mmol/L 140  142  141   Potassium 3.5 - 5.2 mmol/L 4.2  3.4  3.9   Chloride 96 - 106 mmol/L 101  117  101   CO2 20 - 29 mmol/L 23  17  25    Calcium  8.7 - 10.3 mg/dL 9.2  7.0  9.6   Total Protein 6.0 - 8.5 g/dL 7.1  5.2  6.9   Total Bilirubin 0.0 - 1.2 mg/dL 0.3  0.7  0.3   Alkaline Phos 49 - 135 IU/L 153  62  134   AST 0 - 40 IU/L 16  17  20    ALT 0 - 32 IU/L 19  12  17     Lipid Panel     Component Value Date/Time   CHOL 162 07/22/2024 1226   TRIG  129 07/22/2024 1226   HDL 40 07/22/2024 1226   CHOLHDL 4.1 07/22/2024 1226   CHOLHDL 6.1 10/26/2013 0949   VLDL 36 10/26/2013 0949   LDLCALC 99 07/22/2024 1226    CBC    Component Value Date/Time   WBC 8.3 07/22/2024 1226    WBC 7.9 08/16/2023 0845   RBC 4.83 07/22/2024 1226   RBC 3.97 08/16/2023 0845   HGB 12.2 07/22/2024 1226   HCT 38.2 07/22/2024 1226   PLT 362 07/22/2024 1226   MCV 79 07/22/2024 1226   MCH 25.3 (L) 07/22/2024 1226   MCH 26.7 08/16/2023 0845   MCHC 31.9 07/22/2024 1226   MCHC 31.3 08/16/2023 0845   RDW 14.1 07/22/2024 1226   LYMPHSABS 1.5 08/16/2023 0845   LYMPHSABS 2.1 12/05/2021 1025   MONOABS 0.5 08/16/2023 0845   EOSABS 0.1 08/16/2023 0845   EOSABS 0.1 12/05/2021 1025   BASOSABS 0.0 08/16/2023 0845   BASOSABS 0.0 12/05/2021 1025    ASSESSMENT AND PLAN: 1. Hypertension associated with diabetes (HCC) (Primary) Close to goal.  I have dated her medication list to reflect what she is currently taking.  She will continue amlodipine  10 mg daily, valsartan  80 mg daily and metoprolol  50 mg daily.  DASH diet encouraged.  2. Type 2 diabetes mellitus with morbid obesity (HCC) -Patient did not tolerate Rybelsus.  Medication has been stopped.  Continue metformin .  Encouraged her to continue trying to eat smaller portions and do meal planning. - Microalbumin / creatinine urine ratio  3. Chronic pansinusitis - Ambulatory referral to ENT  4. Need for vaccination against Streptococcus pneumoniae PCV 20 given today  5. Encounter for screening involving social determinants of health (SDoH) - AMB Referral VBCI Care Management    Patient was given the opportunity to ask questions.  Patient verbalized understanding of the plan and was able to repeat key elements of the plan.   This documentation was completed using Paediatric nurse.  Any transcriptional errors are unintentional.  Orders Placed This Encounter  Procedures   Microalbumin / creatinine urine ratio   Ambulatory referral to ENT   AMB Referral VBCI Care Management     Requested Prescriptions    No prescriptions requested or ordered in this encounter    Return in about 4 months (around 12/17/2024) for  chronic ds management.  Barnie Louder, MD, FACP

## 2024-08-19 NOTE — Patient Instructions (Addendum)
 [facility] Phone: [phone]   VISIT SUMMARY: Today, you came in for a medication review and to discuss your sinus issues. We reviewed your current medications and discussed your ongoing sinus problems. You were accompanied by your husband.  YOUR PLAN: -TYPE 2 DIABETES MELLITUS: Type 2 diabetes is a condition where your body does not use insulin  properly, leading to high blood sugar levels. You will continue taking metformin  500 mg twice daily. You have also been encouraged to keep up with your increased physical activity and dietary changes.  -HYPERTENSION: Hypertension, or high blood pressure, is when the force of your blood against your artery walls is too high. You will continue your current medications: amlodipine , metoprolol , and valsartan . You are also advised to limit your salt intake.  -CHRONIC SINUSITIS: Chronic sinusitis is a long-term inflammation of the sinuses that causes congestion, mucus production, and headaches. Since previous treatments have not been effective, you are being referred to an ENT specialist for further evaluation and management.  -GENERAL HEALTH MAINTENANCE: Routine health maintenance is ongoing. Your Cologuard test was normal. You are due for a mammogram and have been given the phone number to schedule it. You received a pneumonia vaccine today and a urine sample was taken to check for proteinuria.  INSTRUCTIONS: Please continue taking your medications as prescribed. Schedule your mammogram using the provided phone number. Follow up with the ENT specialist as referred. Continue with your increased physical activity and dietary changes. Limit your salt intake. We will contact you with the results of your urine test.                      Contains text generated by [employer]bridge.                                 Contains text generated by Abridge.

## 2024-08-20 ENCOUNTER — Other Ambulatory Visit: Payer: Self-pay | Admitting: Internal Medicine

## 2024-08-20 DIAGNOSIS — B9689 Other specified bacterial agents as the cause of diseases classified elsewhere: Secondary | ICD-10-CM

## 2024-08-20 LAB — MICROALBUMIN / CREATININE URINE RATIO
Creatinine, Urine: 110.3 mg/dL
Microalb/Creat Ratio: 15 mg/g{creat} (ref 0–29)
Microalbumin, Urine: 16 ug/mL

## 2024-08-21 ENCOUNTER — Ambulatory Visit: Payer: Self-pay | Admitting: Internal Medicine

## 2024-08-25 ENCOUNTER — Inpatient Hospital Stay: Admission: RE | Admit: 2024-08-25 | Discharge: 2024-08-25 | Attending: Internal Medicine | Admitting: Internal Medicine

## 2024-08-25 DIAGNOSIS — Z1231 Encounter for screening mammogram for malignant neoplasm of breast: Secondary | ICD-10-CM

## 2024-08-31 ENCOUNTER — Ambulatory Visit: Payer: Self-pay

## 2024-08-31 NOTE — Telephone Encounter (Signed)
 Advised patient that For most healthy adults: One shot may be enough for a lifetime, but older adults may need a booster dose, depending on their previous vaccinations and age. Advised she may discuss with provider during next appointment.

## 2024-08-31 NOTE — Telephone Encounter (Signed)
 FYI Only or Action Required?: Action required by provider: clinical question for provider.  Patient was last seen in primary care on 08/19/2024 by Vicci Barnie NOVAK, MD.  Called Nurse Triage reporting Immunizations.   Triage Disposition: Call PCP When Office is Open  Patient/caregiver understands and will follow disposition?: Yes        Reason for Disposition  [1] Caller requesting NON-URGENT health information AND [2] PCP's office is the best resource    Pt inquiring if she needs an additional dose of Pneumococcal PPV -- per chart, she last received: 10/19/2018 (63 y.o.).  Answer Assessment - Initial Assessment Questions 1. REASON FOR CALL: What is the main reason for your call? or How can I best help you?     Pt inquiring if she has received the PCV vaccine. [facility] notified that pt received Pneumococcal PPV 10/19/2018 . 2. SYMPTOMS : Do you have any symptoms?      N/a 3. OTHER QUESTIONS: Do you have any other questions?     Pt inquiring if she needs an additional dose. [facility] will forward encounter for Dr Vicci 's office to review and advise. Patient verbalized understanding and is expecting call back from office for next steps.  Protocols used: Information Only Call - No Triage-A-AH

## 2024-09-01 ENCOUNTER — Encounter (INDEPENDENT_AMBULATORY_CARE_PROVIDER_SITE_OTHER): Payer: Self-pay

## 2024-09-02 ENCOUNTER — Other Ambulatory Visit: Payer: Self-pay | Admitting: Internal Medicine

## 2024-09-02 DIAGNOSIS — I10 Essential (primary) hypertension: Secondary | ICD-10-CM

## 2024-09-02 DIAGNOSIS — E1159 Type 2 diabetes mellitus with other circulatory complications: Secondary | ICD-10-CM

## 2024-12-20 ENCOUNTER — Ambulatory Visit: Admitting: Internal Medicine
